# Patient Record
Sex: Male | Born: 1935 | Race: White | Hispanic: No | Marital: Married | Smoking: Former smoker
Health system: Southern US, Community
[De-identification: ages and names within clinical notes are randomized; demographics above are authoritative.]

## PROBLEM LIST (undated history)

## (undated) DIAGNOSIS — D490 Neoplasm of unspecified behavior of digestive system: Secondary | ICD-10-CM

## (undated) DIAGNOSIS — R42 Dizziness and giddiness: Secondary | ICD-10-CM

## (undated) DIAGNOSIS — E7800 Pure hypercholesterolemia, unspecified: Secondary | ICD-10-CM

## (undated) DIAGNOSIS — I10 Essential (primary) hypertension: Secondary | ICD-10-CM

## (undated) DIAGNOSIS — N3010 Interstitial cystitis (chronic) without hematuria: Secondary | ICD-10-CM

## (undated) DIAGNOSIS — E11319 Type 2 diabetes mellitus with unspecified diabetic retinopathy without macular edema: Secondary | ICD-10-CM

## (undated) DIAGNOSIS — H269 Unspecified cataract: Secondary | ICD-10-CM

## (undated) DIAGNOSIS — R972 Elevated prostate specific antigen [PSA]: Secondary | ICD-10-CM

## (undated) DIAGNOSIS — Z923 Personal history of irradiation: Secondary | ICD-10-CM

## (undated) DIAGNOSIS — E119 Type 2 diabetes mellitus without complications: Secondary | ICD-10-CM

## (undated) DIAGNOSIS — C4490 Unspecified malignant neoplasm of skin, unspecified: Secondary | ICD-10-CM

## (undated) DIAGNOSIS — D126 Benign neoplasm of colon, unspecified: Secondary | ICD-10-CM

## (undated) DIAGNOSIS — E785 Hyperlipidemia, unspecified: Secondary | ICD-10-CM

## (undated) DIAGNOSIS — C61 Malignant neoplasm of prostate: Secondary | ICD-10-CM

## (undated) DIAGNOSIS — I739 Peripheral vascular disease, unspecified: Secondary | ICD-10-CM

## (undated) DIAGNOSIS — I6529 Occlusion and stenosis of unspecified carotid artery: Secondary | ICD-10-CM

## (undated) DIAGNOSIS — I679 Cerebrovascular disease, unspecified: Secondary | ICD-10-CM

## (undated) DIAGNOSIS — H9190 Unspecified hearing loss, unspecified ear: Secondary | ICD-10-CM

## (undated) DIAGNOSIS — Z789 Other specified health status: Secondary | ICD-10-CM

## (undated) DIAGNOSIS — I2510 Atherosclerotic heart disease of native coronary artery without angina pectoris: Secondary | ICD-10-CM

## (undated) DIAGNOSIS — H3530 Unspecified macular degeneration: Secondary | ICD-10-CM

## (undated) DIAGNOSIS — K56609 Unspecified intestinal obstruction, unspecified as to partial versus complete obstruction: Secondary | ICD-10-CM

## (undated) DIAGNOSIS — K219 Gastro-esophageal reflux disease without esophagitis: Secondary | ICD-10-CM

## (undated) DIAGNOSIS — D500 Iron deficiency anemia secondary to blood loss (chronic): Secondary | ICD-10-CM

## (undated) DIAGNOSIS — I240 Acute coronary thrombosis not resulting in myocardial infarction: Secondary | ICD-10-CM

## (undated) HISTORY — DX: Iron deficiency anemia secondary to blood loss (chronic): D50.0

## (undated) HISTORY — DX: Atherosclerotic heart disease of native coronary artery without angina pectoris: I25.10

## (undated) HISTORY — PX: DENTAL SURGERY: SHX609

## (undated) HISTORY — DX: Dizziness and giddiness: R42

## (undated) HISTORY — DX: Essential (primary) hypertension: I10

## (undated) HISTORY — PX: URETEROLITHOTOMY: SHX71

## (undated) HISTORY — DX: Elevated prostate specific antigen (PSA): R97.20

## (undated) HISTORY — DX: Type 2 diabetes mellitus with unspecified diabetic retinopathy without macular edema: E11.319

## (undated) HISTORY — DX: Other specified health status: Z78.9

## (undated) HISTORY — DX: Peripheral vascular disease, unspecified: I73.9

## (undated) HISTORY — DX: Interstitial cystitis (chronic) without hematuria: N30.10

## (undated) HISTORY — DX: Type 2 diabetes mellitus without complications: E11.9

## (undated) HISTORY — DX: Occlusion and stenosis of unspecified carotid artery: I65.29

## (undated) HISTORY — DX: Hyperlipidemia, unspecified: E78.5

## (undated) HISTORY — DX: Cerebrovascular disease, unspecified: I67.9

---
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
## 1970-06-24 HISTORY — PX: KIDNEY STONE SURGERY: SHX686

## 1978-06-24 HISTORY — PX: OTHER SURGICAL HISTORY: SHX169

## 1999-06-25 DIAGNOSIS — I240 Acute coronary thrombosis not resulting in myocardial infarction: Secondary | ICD-10-CM

## 1999-06-25 HISTORY — DX: Acute coronary thrombosis not resulting in myocardial infarction: I24.0

## 1999-06-25 HISTORY — PX: CORONARY ARTERY BYPASS GRAFT: SHX141

## 1999-11-23 HISTORY — PX: CARDIAC CATHETERIZATION: SHX172

## 1999-12-12 ENCOUNTER — Encounter (INDEPENDENT_AMBULATORY_CARE_PROVIDER_SITE_OTHER): Payer: Self-pay | Admitting: *Deleted

## 1999-12-12 ENCOUNTER — Inpatient Hospital Stay (HOSPITAL_COMMUNITY): Admission: AD | Admit: 1999-12-12 | Discharge: 1999-12-21 | Payer: Self-pay | Admitting: Cardiology

## 1999-12-14 ENCOUNTER — Encounter: Payer: Self-pay | Admitting: Thoracic Surgery (Cardiothoracic Vascular Surgery)

## 1999-12-15 ENCOUNTER — Encounter: Payer: Self-pay | Admitting: Thoracic Surgery (Cardiothoracic Vascular Surgery)

## 1999-12-16 ENCOUNTER — Encounter: Payer: Self-pay | Admitting: Thoracic Surgery (Cardiothoracic Vascular Surgery)

## 1999-12-17 ENCOUNTER — Encounter: Payer: Self-pay | Admitting: Thoracic Surgery (Cardiothoracic Vascular Surgery)

## 1999-12-18 ENCOUNTER — Encounter: Payer: Self-pay | Admitting: Thoracic Surgery (Cardiothoracic Vascular Surgery)

## 2000-01-14 ENCOUNTER — Encounter: Payer: Self-pay | Admitting: Thoracic Surgery (Cardiothoracic Vascular Surgery)

## 2000-01-14 ENCOUNTER — Encounter
Admission: RE | Admit: 2000-01-14 | Discharge: 2000-01-14 | Payer: Self-pay | Admitting: Thoracic Surgery (Cardiothoracic Vascular Surgery)

## 2000-02-05 ENCOUNTER — Encounter (HOSPITAL_COMMUNITY): Admission: RE | Admit: 2000-02-05 | Discharge: 2000-05-05 | Payer: Self-pay | Admitting: Cardiology

## 2004-06-05 ENCOUNTER — Ambulatory Visit: Payer: Self-pay | Admitting: Cardiology

## 2004-06-27 ENCOUNTER — Ambulatory Visit: Payer: Self-pay | Admitting: Internal Medicine

## 2004-08-21 ENCOUNTER — Ambulatory Visit: Payer: Self-pay | Admitting: Internal Medicine

## 2004-10-04 ENCOUNTER — Ambulatory Visit: Payer: Self-pay | Admitting: Internal Medicine

## 2004-12-05 ENCOUNTER — Ambulatory Visit: Payer: Self-pay

## 2004-12-13 ENCOUNTER — Ambulatory Visit: Payer: Self-pay | Admitting: Cardiology

## 2004-12-18 ENCOUNTER — Ambulatory Visit: Payer: Self-pay

## 2005-01-24 ENCOUNTER — Ambulatory Visit: Payer: Self-pay | Admitting: Internal Medicine

## 2005-01-31 ENCOUNTER — Ambulatory Visit: Payer: Self-pay | Admitting: Internal Medicine

## 2005-05-24 ENCOUNTER — Ambulatory Visit: Payer: Self-pay | Admitting: Internal Medicine

## 2005-06-07 ENCOUNTER — Ambulatory Visit: Payer: Self-pay | Admitting: Internal Medicine

## 2005-10-08 ENCOUNTER — Ambulatory Visit: Payer: Self-pay | Admitting: Internal Medicine

## 2005-10-15 ENCOUNTER — Ambulatory Visit: Payer: Self-pay | Admitting: Internal Medicine

## 2005-12-13 ENCOUNTER — Ambulatory Visit: Payer: Self-pay

## 2005-12-18 ENCOUNTER — Ambulatory Visit: Payer: Self-pay | Admitting: Cardiology

## 2006-02-03 ENCOUNTER — Ambulatory Visit: Payer: Self-pay | Admitting: Internal Medicine

## 2006-02-10 ENCOUNTER — Ambulatory Visit: Payer: Self-pay | Admitting: Internal Medicine

## 2006-06-05 ENCOUNTER — Ambulatory Visit: Payer: Self-pay | Admitting: Internal Medicine

## 2006-06-05 LAB — CONVERTED CEMR LAB
ALT: 18 units/L (ref 0–40)
AST: 19 units/L (ref 0–37)
Albumin: 4 g/dL (ref 3.5–5.2)
Alkaline Phosphatase: 48 units/L (ref 39–117)
BUN: 18 mg/dL (ref 6–23)
Bilirubin, Direct: 0.2 mg/dL (ref 0.0–0.3)
CO2: 25 meq/L (ref 19–32)
Calcium: 9.3 mg/dL (ref 8.4–10.5)
Chloride: 102 meq/L (ref 96–112)
Chol/HDL Ratio, serum: 4.7
Cholesterol: 140 mg/dL (ref 0–200)
Creatinine, Ser: 1.3 mg/dL (ref 0.4–1.5)
Creatinine,U: 152.5 mg/dL
GFR calc non Af Amer: 58 mL/min
Glomerular Filtration Rate, Af Am: 70 mL/min/{1.73_m2}
Glucose, Bld: 179 mg/dL — ABNORMAL HIGH (ref 70–99)
HDL: 29.6 mg/dL — ABNORMAL LOW (ref >39.0)
Hgb A1c MFr Bld: 7.5 % — ABNORMAL HIGH (ref 4.6–6.0)
LDL DIRECT: 91.5 mg/dL
Microalb Creat Ratio: 5.9 mg/g (ref 0.0–30.0)
Microalb, Ur: 0.9 mg/dL (ref 0.0–1.9)
Potassium: 4.1 meq/L (ref 3.5–5.1)
Sodium: 137 meq/L (ref 135–145)
Total Bilirubin: 0.6 mg/dL (ref 0.3–1.2)
Total Protein: 6.3 g/dL (ref 6.0–8.3)
Triglyceride fasting, serum: 202 mg/dL (ref 0–149)
VLDL: 40 mg/dL (ref 0–40)

## 2006-06-12 ENCOUNTER — Ambulatory Visit: Payer: Self-pay | Admitting: Internal Medicine

## 2006-10-09 ENCOUNTER — Ambulatory Visit: Payer: Self-pay | Admitting: Internal Medicine

## 2006-10-09 LAB — CONVERTED CEMR LAB
ALT: 27 units/L (ref 0–40)
AST: 21 units/L (ref 0–37)
Albumin: 4 g/dL (ref 3.5–5.2)
Alkaline Phosphatase: 44 units/L (ref 39–117)
BUN: 17 mg/dL (ref 6–23)
Bilirubin, Direct: 0.1 mg/dL (ref 0.0–0.3)
CO2: 31 meq/L (ref 19–32)
Calcium: 9.6 mg/dL (ref 8.4–10.5)
Chloride: 100 meq/L (ref 96–112)
Cholesterol: 160 mg/dL (ref 0–200)
Creatinine, Ser: 1.2 mg/dL (ref 0.4–1.5)
Direct LDL: 93.2 mg/dL
GFR calc Af Amer: 76 mL/min
GFR calc non Af Amer: 63 mL/min
Glucose, Bld: 132 mg/dL — ABNORMAL HIGH (ref 70–99)
HDL: 37.2 mg/dL — ABNORMAL LOW (ref >39.0)
Hgb A1c MFr Bld: 7.6 % — ABNORMAL HIGH (ref 4.6–6.0)
Potassium: 3.8 meq/L (ref 3.5–5.1)
Sodium: 142 meq/L (ref 135–145)
Total Bilirubin: 0.9 mg/dL (ref 0.3–1.2)
Total CHOL/HDL Ratio: 4.3
Total Protein: 7.2 g/dL (ref 6.0–8.3)
Triglycerides: 204 mg/dL (ref 0–149)
VLDL: 41 mg/dL — ABNORMAL HIGH (ref 0–40)

## 2006-10-20 ENCOUNTER — Ambulatory Visit: Payer: Self-pay | Admitting: Internal Medicine

## 2006-12-19 ENCOUNTER — Ambulatory Visit: Payer: Self-pay | Admitting: Cardiology

## 2007-01-16 DIAGNOSIS — I10 Essential (primary) hypertension: Secondary | ICD-10-CM | POA: Insufficient documentation

## 2007-01-16 DIAGNOSIS — E1151 Type 2 diabetes mellitus with diabetic peripheral angiopathy without gangrene: Secondary | ICD-10-CM | POA: Insufficient documentation

## 2007-01-16 DIAGNOSIS — N3010 Interstitial cystitis (chronic) without hematuria: Secondary | ICD-10-CM | POA: Insufficient documentation

## 2007-01-16 DIAGNOSIS — E785 Hyperlipidemia, unspecified: Secondary | ICD-10-CM | POA: Insufficient documentation

## 2007-01-16 DIAGNOSIS — I2510 Atherosclerotic heart disease of native coronary artery without angina pectoris: Secondary | ICD-10-CM | POA: Insufficient documentation

## 2007-01-16 DIAGNOSIS — C61 Malignant neoplasm of prostate: Secondary | ICD-10-CM | POA: Insufficient documentation

## 2007-02-09 ENCOUNTER — Ambulatory Visit: Payer: Self-pay | Admitting: Internal Medicine

## 2007-02-09 LAB — CONVERTED CEMR LAB
ALT: 22 units/L (ref 0–53)
AST: 20 units/L (ref 0–37)
Albumin: 3.9 g/dL (ref 3.5–5.2)
Alkaline Phosphatase: 43 units/L (ref 39–117)
BUN: 16 mg/dL (ref 6–23)
Bilirubin, Direct: 0.1 mg/dL (ref 0.0–0.3)
CO2: 29 meq/L (ref 19–32)
Calcium: 9 mg/dL (ref 8.4–10.5)
Chloride: 106 meq/L (ref 96–112)
Cholesterol: 154 mg/dL (ref 0–200)
Creatinine, Ser: 1.3 mg/dL (ref 0.4–1.5)
Direct LDL: 84.6 mg/dL
GFR calc Af Amer: 70 mL/min
GFR calc non Af Amer: 58 mL/min
Glucose, Bld: 154 mg/dL — ABNORMAL HIGH (ref 70–99)
HDL: 30 mg/dL — ABNORMAL LOW (ref >39.0)
Hgb A1c MFr Bld: 7 % — ABNORMAL HIGH (ref 4.6–6.0)
Potassium: 4.5 meq/L (ref 3.5–5.1)
Sodium: 141 meq/L (ref 135–145)
Total Bilirubin: 0.7 mg/dL (ref 0.3–1.2)
Total CHOL/HDL Ratio: 5.1
Total Protein: 7 g/dL (ref 6.0–8.3)
Triglycerides: 243 mg/dL (ref 0–149)
VLDL: 49 mg/dL — ABNORMAL HIGH (ref 0–40)

## 2007-02-16 ENCOUNTER — Ambulatory Visit: Payer: Self-pay | Admitting: Internal Medicine

## 2007-02-24 ENCOUNTER — Ambulatory Visit: Payer: Self-pay

## 2007-02-24 ENCOUNTER — Encounter: Payer: Self-pay | Admitting: Internal Medicine

## 2007-03-10 ENCOUNTER — Telehealth: Payer: Self-pay | Admitting: Internal Medicine

## 2007-03-13 ENCOUNTER — Telehealth (INDEPENDENT_AMBULATORY_CARE_PROVIDER_SITE_OTHER): Payer: Self-pay | Admitting: *Deleted

## 2007-03-27 ENCOUNTER — Ambulatory Visit: Payer: Self-pay

## 2007-03-27 ENCOUNTER — Encounter: Payer: Self-pay | Admitting: Internal Medicine

## 2007-04-13 ENCOUNTER — Telehealth: Payer: Self-pay | Admitting: Internal Medicine

## 2007-04-16 ENCOUNTER — Ambulatory Visit: Payer: Self-pay | Admitting: Internal Medicine

## 2007-05-07 ENCOUNTER — Ambulatory Visit: Payer: Self-pay | Admitting: Cardiology

## 2007-06-25 HISTORY — PX: OTHER SURGICAL HISTORY: SHX169

## 2007-07-03 ENCOUNTER — Encounter: Payer: Self-pay | Admitting: Internal Medicine

## 2007-07-08 ENCOUNTER — Encounter: Payer: Self-pay | Admitting: Internal Medicine

## 2007-07-28 ENCOUNTER — Ambulatory Visit: Payer: Self-pay | Admitting: Internal Medicine

## 2007-07-29 LAB — CONVERTED CEMR LAB
ALT: 22 units/L (ref 0–53)
AST: 19 units/L (ref 0–37)
Albumin: 4 g/dL (ref 3.5–5.2)
Alkaline Phosphatase: 41 units/L (ref 39–117)
BUN: 16 mg/dL (ref 6–23)
Bilirubin, Direct: 0.2 mg/dL (ref 0.0–0.3)
CO2: 29 meq/L (ref 19–32)
Calcium: 9.7 mg/dL (ref 8.4–10.5)
Chloride: 100 meq/L (ref 96–112)
Cholesterol: 168 mg/dL (ref 0–200)
Creatinine, Ser: 1.3 mg/dL (ref 0.4–1.5)
GFR calc Af Amer: 69 mL/min
GFR calc non Af Amer: 57 mL/min
Glucose, Bld: 131 mg/dL — ABNORMAL HIGH (ref 70–99)
HDL: 31.3 mg/dL — ABNORMAL LOW (ref >39.0)
Hgb A1c MFr Bld: 6.7 % — ABNORMAL HIGH (ref 4.6–6.0)
LDL Cholesterol: 103 mg/dL — ABNORMAL HIGH (ref 0–99)
Potassium: 4.4 meq/L (ref 3.5–5.1)
Sodium: 138 meq/L (ref 135–145)
Total Bilirubin: 0.8 mg/dL (ref 0.3–1.2)
Total CHOL/HDL Ratio: 5.4
Total Protein: 6.8 g/dL (ref 6.0–8.3)
Triglycerides: 170 mg/dL — ABNORMAL HIGH (ref 0–149)
VLDL: 34 mg/dL (ref 0–40)

## 2007-10-21 ENCOUNTER — Ambulatory Visit: Payer: Self-pay | Admitting: Internal Medicine

## 2007-12-03 ENCOUNTER — Ambulatory Visit: Payer: Self-pay | Admitting: Cardiology

## 2007-12-03 ENCOUNTER — Ambulatory Visit: Payer: Self-pay

## 2007-12-15 ENCOUNTER — Encounter: Admission: RE | Admit: 2007-12-15 | Discharge: 2007-12-15 | Payer: Self-pay | Admitting: *Deleted

## 2007-12-16 ENCOUNTER — Ambulatory Visit (HOSPITAL_BASED_OUTPATIENT_CLINIC_OR_DEPARTMENT_OTHER): Admission: RE | Admit: 2007-12-16 | Discharge: 2007-12-16 | Payer: Self-pay | Admitting: *Deleted

## 2008-01-14 ENCOUNTER — Ambulatory Visit: Payer: Self-pay

## 2008-01-14 ENCOUNTER — Encounter: Payer: Self-pay | Admitting: Internal Medicine

## 2008-01-25 ENCOUNTER — Ambulatory Visit: Payer: Self-pay | Admitting: Internal Medicine

## 2008-01-26 LAB — CONVERTED CEMR LAB
ALT: 23 units/L (ref 0–53)
AST: 23 units/L (ref 0–37)
BUN: 16 mg/dL (ref 6–23)
CO2: 30 meq/L (ref 19–32)
Calcium: 9.3 mg/dL (ref 8.4–10.5)
Chloride: 103 meq/L (ref 96–112)
Cholesterol: 169 mg/dL (ref 0–200)
Creatinine, Ser: 1.3 mg/dL (ref 0.4–1.5)
Direct LDL: 106 mg/dL
GFR calc Af Amer: 69 mL/min
GFR calc non Af Amer: 57 mL/min
Glucose, Bld: 95 mg/dL (ref 70–99)
HDL: 31.9 mg/dL — ABNORMAL LOW (ref >39.0)
Hgb A1c MFr Bld: 7 % — ABNORMAL HIGH (ref 4.6–6.0)
Potassium: 4.5 meq/L (ref 3.5–5.1)
Sodium: 139 meq/L (ref 135–145)
Total CHOL/HDL Ratio: 5.3
Triglycerides: 215 mg/dL (ref 0–149)
VLDL: 43 mg/dL — ABNORMAL HIGH (ref 0–40)

## 2008-05-26 ENCOUNTER — Ambulatory Visit: Payer: Self-pay | Admitting: Internal Medicine

## 2008-05-30 LAB — CONVERTED CEMR LAB
ALT: 19 units/L (ref 0–53)
AST: 21 units/L (ref 0–37)
BUN: 18 mg/dL (ref 6–23)
CO2: 28 meq/L (ref 19–32)
Calcium: 10.3 mg/dL (ref 8.4–10.5)
Chloride: 103 meq/L (ref 96–112)
Cholesterol: 157 mg/dL (ref 0–200)
Creatinine, Ser: 1.2 mg/dL (ref 0.4–1.5)
GFR calc Af Amer: 76 mL/min
GFR calc non Af Amer: 63 mL/min
Glucose, Bld: 111 mg/dL — ABNORMAL HIGH (ref 70–99)
HDL: 30.1 mg/dL — ABNORMAL LOW (ref >39.0)
Hgb A1c MFr Bld: 6.7 % — ABNORMAL HIGH (ref 4.6–6.0)
LDL Cholesterol: 92 mg/dL (ref 0–99)
Potassium: 4.8 meq/L (ref 3.5–5.1)
Sodium: 138 meq/L (ref 135–145)
Total CHOL/HDL Ratio: 5.2
Triglycerides: 177 mg/dL — ABNORMAL HIGH (ref 0–149)
VLDL: 35 mg/dL (ref 0–40)

## 2008-09-28 ENCOUNTER — Ambulatory Visit: Payer: Self-pay | Admitting: Internal Medicine

## 2008-10-03 LAB — CONVERTED CEMR LAB
ALT: 22 units/L (ref 0–53)
AST: 20 units/L (ref 0–37)
Albumin: 3.7 g/dL (ref 3.5–5.2)
Alkaline Phosphatase: 40 units/L (ref 39–117)
BUN: 19 mg/dL (ref 6–23)
Bilirubin, Direct: 0 mg/dL (ref 0.0–0.3)
CO2: 28 meq/L (ref 19–32)
Calcium: 9.3 mg/dL (ref 8.4–10.5)
Chloride: 105 meq/L (ref 96–112)
Cholesterol: 167 mg/dL (ref 0–200)
Creatinine, Ser: 1.2 mg/dL (ref 0.4–1.5)
Direct LDL: 106.7 mg/dL
GFR calc non Af Amer: 62.64 mL/min (ref >60)
Glucose, Bld: 111 mg/dL — ABNORMAL HIGH (ref 70–99)
HDL: 31.6 mg/dL — ABNORMAL LOW (ref >39.00)
Hgb A1c MFr Bld: 6.7 % — ABNORMAL HIGH (ref 4.6–6.5)
Potassium: 4.3 meq/L (ref 3.5–5.1)
Sodium: 141 meq/L (ref 135–145)
Total Bilirubin: 1.1 mg/dL (ref 0.3–1.2)
Total CHOL/HDL Ratio: 5
Total Protein: 7.1 g/dL (ref 6.0–8.3)
Triglycerides: 214 mg/dL — ABNORMAL HIGH (ref 0.0–149.0)
VLDL: 42.8 mg/dL — ABNORMAL HIGH (ref 0.0–40.0)

## 2008-11-18 ENCOUNTER — Ambulatory Visit: Payer: Self-pay | Admitting: Family Medicine

## 2008-12-10 DIAGNOSIS — I679 Cerebrovascular disease, unspecified: Secondary | ICD-10-CM | POA: Insufficient documentation

## 2008-12-15 ENCOUNTER — Ambulatory Visit: Payer: Self-pay

## 2008-12-15 ENCOUNTER — Ambulatory Visit: Payer: Self-pay | Admitting: Cardiology

## 2009-02-01 ENCOUNTER — Ambulatory Visit: Payer: Self-pay | Admitting: Internal Medicine

## 2009-02-02 LAB — CONVERTED CEMR LAB
ALT: 21 units/L (ref 0–53)
AST: 21 units/L (ref 0–37)
Albumin: 4 g/dL (ref 3.5–5.2)
Alkaline Phosphatase: 52 units/L (ref 39–117)
BUN: 16 mg/dL (ref 6–23)
Bilirubin, Direct: 0 mg/dL (ref 0.0–0.3)
CO2: 28 meq/L (ref 19–32)
Calcium: 9.4 mg/dL (ref 8.4–10.5)
Chloride: 102 meq/L (ref 96–112)
Cholesterol: 155 mg/dL (ref 0–200)
Creatinine, Ser: 1.2 mg/dL (ref 0.4–1.5)
Direct LDL: 95.5 mg/dL
GFR calc non Af Amer: 62.58 mL/min (ref >60)
Glucose, Bld: 184 mg/dL — ABNORMAL HIGH (ref 70–99)
HDL: 31.9 mg/dL — ABNORMAL LOW (ref >39.00)
Hgb A1c MFr Bld: 7 % — ABNORMAL HIGH (ref 4.6–6.5)
Potassium: 4.5 meq/L (ref 3.5–5.1)
Sodium: 138 meq/L (ref 135–145)
TSH: 1.67 microintl units/mL (ref 0.35–5.50)
Total Bilirubin: 0.6 mg/dL (ref 0.3–1.2)
Total CHOL/HDL Ratio: 5
Total Protein: 7.4 g/dL (ref 6.0–8.3)
Triglycerides: 216 mg/dL — ABNORMAL HIGH (ref 0.0–149.0)
VLDL: 43.2 mg/dL — ABNORMAL HIGH (ref 0.0–40.0)

## 2009-03-27 ENCOUNTER — Telehealth: Payer: Self-pay | Admitting: Internal Medicine

## 2009-06-02 ENCOUNTER — Ambulatory Visit: Payer: Self-pay | Admitting: Internal Medicine

## 2009-06-02 DIAGNOSIS — I739 Peripheral vascular disease, unspecified: Secondary | ICD-10-CM | POA: Insufficient documentation

## 2009-06-05 LAB — CONVERTED CEMR LAB
ALT: 26 units/L (ref 0–53)
AST: 23 units/L (ref 0–37)
Albumin: 4 g/dL (ref 3.5–5.2)
Alkaline Phosphatase: 41 units/L (ref 39–117)
BUN: 19 mg/dL (ref 6–23)
Bilirubin, Direct: 0 mg/dL (ref 0.0–0.3)
CO2: 29 meq/L (ref 19–32)
Calcium: 9 mg/dL (ref 8.4–10.5)
Chloride: 100 meq/L (ref 96–112)
Cholesterol: 174 mg/dL (ref 0–200)
Creatinine, Ser: 1.1 mg/dL (ref 0.4–1.5)
GFR calc non Af Amer: 69.13 mL/min (ref >60)
Glucose, Bld: 189 mg/dL — ABNORMAL HIGH (ref 70–99)
HDL: 31.1 mg/dL — ABNORMAL LOW (ref >39.00)
Hgb A1c MFr Bld: 7.4 % — ABNORMAL HIGH (ref 4.6–6.5)
LDL Cholesterol: 105 mg/dL — ABNORMAL HIGH (ref 0–99)
Potassium: 4.7 meq/L (ref 3.5–5.1)
Sodium: 136 meq/L (ref 135–145)
Total Bilirubin: 0.8 mg/dL (ref 0.3–1.2)
Total CHOL/HDL Ratio: 6
Total Protein: 7.3 g/dL (ref 6.0–8.3)
Triglycerides: 188 mg/dL — ABNORMAL HIGH (ref 0.0–149.0)
VLDL: 37.6 mg/dL (ref 0.0–40.0)

## 2009-06-26 ENCOUNTER — Encounter: Payer: Self-pay | Admitting: Internal Medicine

## 2009-06-27 ENCOUNTER — Ambulatory Visit: Payer: Self-pay

## 2009-06-27 ENCOUNTER — Encounter: Payer: Self-pay | Admitting: Cardiovascular Disease

## 2009-12-22 LAB — HM DIABETES EYE EXAM: HM Diabetic Eye Exam: NORMAL

## 2009-12-28 ENCOUNTER — Ambulatory Visit: Payer: Self-pay | Admitting: Internal Medicine

## 2010-01-12 ENCOUNTER — Encounter: Payer: Self-pay | Admitting: Cardiology

## 2010-01-15 ENCOUNTER — Encounter: Admission: RE | Admit: 2010-01-15 | Discharge: 2010-01-18 | Payer: Self-pay | Admitting: Internal Medicine

## 2010-01-16 ENCOUNTER — Ambulatory Visit: Payer: Self-pay | Admitting: Cardiology

## 2010-01-16 ENCOUNTER — Ambulatory Visit: Payer: Self-pay

## 2010-01-22 LAB — CONVERTED CEMR LAB
ALT: 21 units/L (ref 0–53)
AST: 23 units/L (ref 0–37)
Albumin: 4 g/dL (ref 3.5–5.2)
Alkaline Phosphatase: 42 units/L (ref 39–117)
BUN: 21 mg/dL (ref 6–23)
Basophils Absolute: 0 10*3/uL (ref 0.0–0.1)
Basophils Relative: 0.4 % (ref 0.0–3.0)
Bilirubin, Direct: 0.1 mg/dL (ref 0.0–0.3)
CO2: 25 meq/L (ref 19–32)
Calcium: 9.6 mg/dL (ref 8.4–10.5)
Chloride: 103 meq/L (ref 96–112)
Cholesterol: 193 mg/dL (ref 0–200)
Creatinine, Ser: 1.4 mg/dL (ref 0.4–1.5)
Direct LDL: 120.3 mg/dL
Eosinophils Absolute: 0.4 10*3/uL (ref 0.0–0.7)
Eosinophils Relative: 5.2 % — ABNORMAL HIGH (ref 0.0–5.0)
GFR calc non Af Amer: 54.03 mL/min (ref >60)
Glucose, Bld: 163 mg/dL — ABNORMAL HIGH (ref 70–99)
HCT: 38.5 % — ABNORMAL LOW (ref 39.0–52.0)
HDL: 34.2 mg/dL — ABNORMAL LOW (ref >39.00)
Hemoglobin: 12.9 g/dL — ABNORMAL LOW (ref 13.0–17.0)
Hgb A1c MFr Bld: 7.6 % — ABNORMAL HIGH (ref 4.6–6.5)
Lymphocytes Relative: 24.4 % (ref 12.0–46.0)
Lymphs Abs: 1.7 10*3/uL (ref 0.7–4.0)
MCHC: 33.7 g/dL (ref 30.0–36.0)
MCV: 89.8 fL (ref 78.0–100.0)
Monocytes Absolute: 0.4 10*3/uL (ref 0.1–1.0)
Monocytes Relative: 6.1 % (ref 3.0–12.0)
Neutro Abs: 4.4 10*3/uL (ref 1.4–7.7)
Neutrophils Relative %: 63.9 % (ref 43.0–77.0)
Platelets: 212 10*3/uL (ref 150.0–400.0)
Potassium: 4.7 meq/L (ref 3.5–5.1)
RBC: 4.28 M/uL (ref 4.22–5.81)
RDW: 13.8 % (ref 11.5–14.6)
Sodium: 139 meq/L (ref 135–145)
TSH: 2.19 microintl units/mL (ref 0.35–5.50)
Total Bilirubin: 0.5 mg/dL (ref 0.3–1.2)
Total CHOL/HDL Ratio: 6
Total Protein: 7.1 g/dL (ref 6.0–8.3)
Triglycerides: 328 mg/dL — ABNORMAL HIGH (ref 0.0–149.0)
VLDL: 65.6 mg/dL — ABNORMAL HIGH (ref 0.0–40.0)
WBC: 6.9 10*3/uL (ref 4.5–10.5)

## 2010-01-23 ENCOUNTER — Encounter: Payer: Self-pay | Admitting: Internal Medicine

## 2010-02-15 ENCOUNTER — Ambulatory Visit: Payer: Self-pay | Admitting: Internal Medicine

## 2010-02-15 DIAGNOSIS — K219 Gastro-esophageal reflux disease without esophagitis: Secondary | ICD-10-CM | POA: Insufficient documentation

## 2010-02-15 DIAGNOSIS — D649 Anemia, unspecified: Secondary | ICD-10-CM | POA: Insufficient documentation

## 2010-02-15 LAB — CONVERTED CEMR LAB
Ferritin: 15.3 ng/mL — ABNORMAL LOW (ref 22.0–322.0)
Folate: >20 ng/mL
Iron: 68 ug/dL (ref 42–165)
Saturation Ratios: 14.8 % — ABNORMAL LOW (ref 20.0–50.0)
Transferrin: 328.4 mg/dL (ref 212.0–360.0)
Vitamin B-12: 312 pg/mL (ref 211–911)

## 2010-03-24 DIAGNOSIS — D490 Neoplasm of unspecified behavior of digestive system: Secondary | ICD-10-CM

## 2010-03-24 HISTORY — DX: Neoplasm of unspecified behavior of digestive system: D49.0

## 2010-03-26 ENCOUNTER — Ambulatory Visit: Payer: Self-pay | Admitting: Internal Medicine

## 2010-03-26 LAB — CONVERTED CEMR LAB: UREASE: NEGATIVE

## 2010-03-26 LAB — HM COLONOSCOPY

## 2010-03-28 ENCOUNTER — Encounter: Payer: Self-pay | Admitting: Internal Medicine

## 2010-04-09 ENCOUNTER — Encounter: Payer: Self-pay | Admitting: Cardiology

## 2010-04-09 ENCOUNTER — Encounter: Payer: Self-pay | Admitting: Internal Medicine

## 2010-04-11 ENCOUNTER — Encounter: Admission: RE | Admit: 2010-04-11 | Discharge: 2010-04-11 | Payer: Self-pay | Admitting: General Surgery

## 2010-04-13 ENCOUNTER — Telehealth: Payer: Self-pay | Admitting: Cardiology

## 2010-04-24 ENCOUNTER — Telehealth (INDEPENDENT_AMBULATORY_CARE_PROVIDER_SITE_OTHER): Payer: Self-pay | Admitting: *Deleted

## 2010-04-25 ENCOUNTER — Encounter (HOSPITAL_COMMUNITY)
Admission: RE | Admit: 2010-04-25 | Discharge: 2010-06-23 | Payer: Self-pay | Source: Home / Self Care | Attending: Cardiology | Admitting: Cardiology

## 2010-04-25 ENCOUNTER — Ambulatory Visit: Payer: Self-pay

## 2010-04-25 ENCOUNTER — Ambulatory Visit: Payer: Self-pay | Admitting: Cardiology

## 2010-04-25 ENCOUNTER — Encounter: Payer: Self-pay | Admitting: Cardiology

## 2010-04-27 ENCOUNTER — Ambulatory Visit: Payer: Self-pay | Admitting: Internal Medicine

## 2010-04-27 LAB — CONVERTED CEMR LAB
ALT: 23 units/L (ref 0–53)
AST: 24 units/L (ref 0–37)
Albumin: 4.1 g/dL (ref 3.5–5.2)
Alkaline Phosphatase: 44 units/L (ref 39–117)
BUN: 20 mg/dL (ref 6–23)
Bilirubin, Direct: 0.1 mg/dL (ref 0.0–0.3)
CO2: 26 meq/L (ref 19–32)
Calcium: 8.9 mg/dL (ref 8.4–10.5)
Chloride: 100 meq/L (ref 96–112)
Cholesterol: 185 mg/dL (ref 0–200)
Creatinine, Ser: 1.5 mg/dL (ref 0.4–1.5)
Direct LDL: 122 mg/dL
GFR calc non Af Amer: 50.14 mL/min (ref >60)
Glucose, Bld: 249 mg/dL — ABNORMAL HIGH (ref 70–99)
HDL: 34 mg/dL — ABNORMAL LOW (ref >39.00)
Hgb A1c MFr Bld: 7.7 % — ABNORMAL HIGH (ref 4.6–6.5)
Potassium: 4.5 meq/L (ref 3.5–5.1)
Sodium: 135 meq/L (ref 135–145)
Total Bilirubin: 0.4 mg/dL (ref 0.3–1.2)
Total CHOL/HDL Ratio: 5
Total Protein: 6.9 g/dL (ref 6.0–8.3)
Triglycerides: 293 mg/dL — ABNORMAL HIGH (ref 0.0–149.0)
VLDL: 58.6 mg/dL — ABNORMAL HIGH (ref 0.0–40.0)

## 2010-05-02 ENCOUNTER — Ambulatory Visit: Payer: Self-pay | Admitting: Cardiology

## 2010-05-04 ENCOUNTER — Ambulatory Visit: Payer: Self-pay | Admitting: Internal Medicine

## 2010-05-24 ENCOUNTER — Inpatient Hospital Stay (HOSPITAL_COMMUNITY)
Admission: RE | Admit: 2010-05-24 | Discharge: 2010-05-29 | Payer: Self-pay | Source: Home / Self Care | Admitting: General Surgery

## 2010-05-24 ENCOUNTER — Encounter (INDEPENDENT_AMBULATORY_CARE_PROVIDER_SITE_OTHER): Payer: Self-pay | Admitting: General Surgery

## 2010-05-24 ENCOUNTER — Encounter (INDEPENDENT_AMBULATORY_CARE_PROVIDER_SITE_OTHER): Payer: Self-pay | Admitting: *Deleted

## 2010-05-24 HISTORY — PX: APPENDECTOMY: SHX54

## 2010-05-24 HISTORY — PX: PARTIAL COLECTOMY: SHX5273

## 2010-06-05 ENCOUNTER — Encounter: Payer: Self-pay | Admitting: Internal Medicine

## 2010-06-11 ENCOUNTER — Encounter: Payer: Self-pay | Admitting: Internal Medicine

## 2010-06-15 ENCOUNTER — Encounter: Payer: Self-pay | Admitting: Internal Medicine

## 2010-06-24 DIAGNOSIS — C4490 Unspecified malignant neoplasm of skin, unspecified: Secondary | ICD-10-CM

## 2010-06-24 HISTORY — PX: CAROTID ENDARTERECTOMY: SUR193

## 2010-06-24 HISTORY — DX: Unspecified malignant neoplasm of skin, unspecified: C44.90

## 2010-07-26 NOTE — Assessment & Plan Note (Signed)
Summary: 6 month rov/njr   Vital Signs:  Patient Profile:   77 Years Old Male Weight:      215 pounds Temp:     98.3 degrees F Pulse rate:   68 / minute BP sitting:   138 / 72  (left arm)  Vitals Entered By: Gladis Riffle, RN (January 25, 2008 9:09 AM)                 Chief Complaint:  6 month rov, fasting--states CBG has been to 250 recently, 50-250 range at home, and 145 this AM.  History of Present Illness:  Follow-Up Visit      This is a 77 year old man who presents for Follow-up visit.  The patient denies chest pain, palpitations, dizziness, syncope, low blood sugar symptoms, high blood sugar symptoms, edema, SOB, DOE, PND, and orthopnea.  Since the last visit the patient notes no new problems or concerns.  The patient reports taking meds as prescribed, not monitoring BP, monitoring blood sugars, and dietary noncompliance.  When questioned about possible medication side effects, the patient notes none.    Past Medical History: Coronary artery disease Diabetes mellitus, type II Hyperlipidemia Hypertension elevated PSA interstitial cystitis intolerant of statins Past Surgical History: Coronary artery bypass graft  2001 kidney stones  1972 Carpal tunnel release  06/09  Social History: Married Former Smoker Regular exercise-no  Family History: father deceased- heart related at age 47 mother deceased 4 yo -no particular cause  no other complaints in a complete ROS     Prior Medication List:  AMLODIPINE BESYLATE 10 MG TABS (AMLODIPINE BESYLATE) one by mouth daily HYDROCHLOROTHIAZIDE 12.5 MG CAPS (HYDROCHLOROTHIAZIDE) Take 1 capsule by mouth once a day LISINOPRIL 20 MG TABS (LISINOPRIL) Take 1 tablet by mouth at bedtime METOPROLOL TARTRATE 50 MG TABS (METOPROLOL TARTRATE) bid MICRONASE 5 MG TABS (GLYBURIDE) 2 i AM and one in PM JANUMET 50-1000 MG  TABS (SITAGLIPTIN-METFORMIN HCL) Take 1 tablet by mouth two times a day ASPIRIN EC 325 MG  TBEC (ASPIRIN) daily    Current Allergies (reviewed today): ! LIPITOR (ATORVASTATIN CALCIUM) ! PRAVACHOL (PRAVASTATIN SODIUM) ! CRESTOR (ROSUVASTATIN CALCIUM) ! ZETIA (EZETIMIBE)  Past Surgical History:    Coronary artery bypass graft  2001    kidney stones  1972    Carpal tunnel release  06/09      Physical Exam  General:     Well-developed,well-nourished,in no acute distress; alert,appropriate and cooperative throughout examination Head:     normocephalic and atraumatic.   Eyes:     pupils equal and pupils round.   Abdomen:     Bowel sounds positive,abdomen soft and non-tender without masses, organomegaly or hernias noted.  overweight Msk:     No deformity or scoliosis noted of thoracic or lumbar spine.   Pulses:     R radial normal and L radial normal.   Extremities:     No clubbing, cyanosis, edema, or deformity noted  Neurologic:     cranial nerves II-XII intact and gait normal.   Skin:     turgor normal and color normal.   Cervical Nodes:     no anterior cervical adenopathy and no posterior cervical adenopathy.   Psych:     memory intact for recent and remote and good eye contact.    Diabetes Management Exam:    Eye Exam:       Eye Exam done elsewhere    Impression & Recommendations:  Problem # 1:  DIABETES MELLITUS,  TYPE II (ICD-250.00) he will schedule eye exam he does check feet daily needs to exercise daily, weight loss most important His updated medication list for this problem includes:    Lisinopril 20 Mg Tabs (Lisinopril) .Marland Kitchen... Take 1 tablet by mouth at bedtime    Micronase 5 Mg Tabs (Glyburide) .Marland Kitchen... 2 i am and one in pm    Janumet 50-1000 Mg Tabs (Sitagliptin-metformin hcl) .Marland Kitchen... Take 1 tablet by mouth two times a day    Aspirin Ec 325 Mg Tbec (Aspirin) .Marland Kitchen... Daily  Orders: TLB-A1C / Hgb A1C (Glycohemoglobin) (83036-A1C) TLB-BMP (Basic Metabolic Panel-BMET) (80048-METABOL) TLB-Lipid Panel (80061-LIPID) TLB-ALT (SGPT) (84460-ALT) TLB-AST (SGOT) (84450-SGOT)    Problem # 2:  HYPERTENSION (ICD-401.9) continue current meds His updated medication list for this problem includes:    Amlodipine Besylate 10 Mg Tabs (Amlodipine besylate) ..... One by mouth daily    Hydrochlorothiazide 12.5 Mg Caps (Hydrochlorothiazide) .Marland Kitchen... Take 1 capsule by mouth once a day    Lisinopril 20 Mg Tabs (Lisinopril) .Marland Kitchen... Take 1 tablet by mouth at bedtime    Metoprolol Tartrate 50 Mg Tabs (Metoprolol tartrate) ..... Bid  BP today: 138/72 Prior BP: 154/78 (10/21/2007)  Labs Reviewed: Creat: 1.3 (07/28/2007) Chol: 168 (07/28/2007)   HDL: 31.3 (07/28/2007)   LDL: 103 (07/28/2007)   TG: 170 (07/28/2007)   Problem # 3:  HYPERLIPIDEMIA (ICD-272.4) fair control check today   Labs Reviewed: Chol: 168 (07/28/2007)   HDL: 31.3 (07/28/2007)   LDL: 103 (07/28/2007)   TG: 170 (07/28/2007) SGOT: 19 (07/28/2007)   SGPT: 22 (07/28/2007)   Complete Medication List: 1)  Amlodipine Besylate 10 Mg Tabs (Amlodipine besylate) .... One by mouth daily 2)  Hydrochlorothiazide 12.5 Mg Caps (Hydrochlorothiazide) .... Take 1 capsule by mouth once a day 3)  Lisinopril 20 Mg Tabs (Lisinopril) .... Take 1 tablet by mouth at bedtime 4)  Metoprolol Tartrate 50 Mg Tabs (Metoprolol tartrate) .... Bid 5)  Micronase 5 Mg Tabs (Glyburide) .... 2 i am and one in pm 6)  Janumet 50-1000 Mg Tabs (Sitagliptin-metformin hcl) .... Take 1 tablet by mouth two times a day 7)  Aspirin Ec 325 Mg Tbec (Aspirin) .... Daily  Other Orders: GI (GI)   Patient Instructions: 1)  See your eye doctor yearly to check for diabetic eye damage.--Call dr Charlann Noss 2)  Please schedule a follow-up appointment in 4 months.   ]  Preventive Care Screening

## 2010-07-26 NOTE — Miscellaneous (Signed)
Summary: Diabetic Supplies/[facility]  Diabetic Supplies/[facility]   Imported By: Esmeralda Links D'jimraou 10/16/2007 15:30:11  _____________________________________________________________________  External Attachment:    Type:   Image     Comment:   External Document

## 2010-07-26 NOTE — Letter (Signed)
Summary: Diabetic Instructions  [facility]  [address] [place], [place] [address]   Phone: [phone]  Fax: [phone]    Clayton Harrison 1930/11/16 MRN: [id number]   x   ORAL DIABETIC MEDICATION INSTRUCTIONS  The day before your procedure:   Take your diabetic pill as you do normally  The day of your procedure:   Do not take your diabetic pill    We will check your blood sugar levels during the admission process and again in Recovery before discharging you home  ________________________________________________________________________  _  _   INSULIN (LONG ACTING) MEDICATION INSTRUCTIONS (Lantus, NPH, 70/30, Humulin, Novolin-N)   The day before your procedure:   Take  your regular evening dose    The day of your procedure:   Do not take your morning dose    _  _   INSULIN (SHORT ACTING) MEDICATION INSTRUCTIONS (Regular, Humulog, Novolog)   The day before your procedure:   Do not take your evening dose   The day of your procedure:   Do not take your morning dose   _  _   INSULIN PUMP MEDICATION INSTRUCTIONS  We will contact the physician managing your diabetic care for written dosage instructions for the day before your procedure and the day of your procedure.  Once we have received the instructions, we will contact you.

## 2010-07-26 NOTE — Progress Notes (Signed)
Summary: status of clearance   Phone Note From Other Clinic   Caller: alicia office [phone] Request: Talk with Nurse Details of Complaint: status of clearance. Initial call taken by: Lorne Skeens,  April 13, 2010 9:02 AM  Follow-up for Phone Call        Awaiting Dr. Jens Som clearance.  Office aware Dr. Jens Som is out of office and will be back Monday. Mylo Red RN     Appended Document: status of clearance clearance for what?  Appended Document: status of clearance spoke with lisa at [facility]-dr wakefield is planning a right hemoocoloctomy-pt has a mass in the right colon. will foward for dr Jens Som review   Appended Document: status of clearance schedule myoview and then f/u ov  Appended Document: Orders Update spoke with pt, myoview and ov scheduled   Clinical Lists Changes  Orders: Added new Referral order of Nuclear Stress Test (Nuc Stress Test) - Signed

## 2010-07-26 NOTE — Miscellaneous (Signed)
Summary: Orders Update-[facility] Referral  Clinical Lists Changes  Problems: Added new problem of NONSPECIFIC ABN FINDING RAD & OTH EXAM GI TRACT (ICD-793.4) - Signed Orders: Added new Test order of [facility] ([facility]) - Signed  Appended Document: Orders Update-[facility] Referral Pt. notified of appt.

## 2010-07-26 NOTE — Progress Notes (Signed)
Summary: needs advice  Phone Note Call from Patient Call back at Home Phone [phone]   Caller: Spouse-faye Call For: dr Jiah Bari Summary of Call: pt had cardio treadmill test 2 wk ago pt needs to know next step Initial call taken by: Heron Sabins,  April 13, 2007 2:26 PM  Follow-up for Phone Call        Spoke to pt. regarding stress test......Marland KitchenScheduled appt with Dr. Cato Mulligan 04/16/2007 to discuss results. Follow-up by: Lynann Beaver CMA,  April 13, 2007 4:35 PM

## 2010-07-26 NOTE — Letter (Signed)
Summary: [facility]   Imported By: Sherian Rein 07/05/2010 12:27:03  _____________________________________________________________________  External Attachment:    Type:   Image     Comment:   External Document

## 2010-07-26 NOTE — Letter (Signed)
Summary: [facility]   Imported By: Maryln Gottron 04/19/2010 10:03:16  _____________________________________________________________________  External Attachment:    Type:   Image     Comment:   External Document

## 2010-07-26 NOTE — Assessment & Plan Note (Signed)
Summary: 6 MONTH ROV/NJR   Vital Signs:  Patient profile:   77 year old male Weight:      214 pounds BMI:     32.18 Temp:     98.2 degrees F oral Pulse rate:   62 / minute Pulse rhythm:   regular Resp:     12 per minute BP sitting:   112 / 60  (left arm) Cuff size:   regular  Vitals Entered By: Gladis Riffle, RN (December 28, 2009 9:34 AM) CC: 6 month rov, fasting--CBGs around 160 daytime at home Is Patient Diabetic? Yes Did you bring your meter with you today? No   CC:  6 month rov and fasting--CBGs around 160 daytime at home.  History of Present Illness:  Follow-Up Visit      This is a 77 year old man who presents for Follow-up visit.  The patient denies chest pain and palpitations.  Since the last visit the patient notes no new problems or concerns.  The patient reports taking meds as prescribed and monitoring blood sugars.  When questioned about possible medication side effects, the patient notes none.    All other systems reviewed and were negative   Preventive Screening-Counseling & Management  Alcohol-Tobacco     Smoking Status: quit > 6 months     Year Started: 1949     Year Quit: 2001  Current Problems (verified): 1)  Peripheral Vascular Disease  (ICD-443.9) 2)  Hypertension  (ICD-401.9) 3)  Hyperlipidemia  (ICD-272.4) 4)  Coronary Artery Disease  (ICD-414.00) 5)  Cerebrovascular Disease  (ICD-437.9) 6)  Interstitial Cystitis  (ICD-595.1) 7)  Psa, Increased  (ICD-790.93) 8)  Diabetes Mellitus, Type II  (ICD-250.00)  Current Medications (verified): 1)  Amlodipine Besylate 10 Mg Tabs (Amlodipine Besylate) .... One By Mouth Daily 2)  Hydrochlorothiazide 12.5 Mg Caps (Hydrochlorothiazide) .... Take 1 Capsule By Mouth Once A Day 3)  Lisinopril 20 Mg Tabs (Lisinopril) .... Take 1 Tablet By Mouth At Bedtime 4)  Metoprolol Tartrate 50 Mg Tabs (Metoprolol Tartrate) .Marland Kitchen.. 1 Tab Po Two Times A Day 5)  Glyburide 5 Mg Tabs (Glyburide) .... Take 2 Tablets in The Morning and 1  Tablet in The Evening Daily 6)  Janumet 50-1000 Mg  Tabs (Sitagliptin-Metformin Hcl) .... Take 1 Tablet By Mouth Two Times A Day 7)  Aspirin Ec 325 Mg  Tbec (Aspirin) .Marland Kitchen.. 1 Tab Po Daily 8)  Omega-3 Fish Oil 1000 Mg Caps (Omega-3 Fatty Acids) .... Two Times A Day 9)  Vitamin C 500 Mg Tabs (Ascorbic Acid) .Marland Kitchen.. 1 Tab By Mouth Once Daily 10)  Meclizine Hcl 25 Mg Tabs (Meclizine Hcl) .Marland Kitchen.. 1 By Mouth Two Times A Day As Needed Vertigo  Allergies: 1)  ! Lipitor (Atorvastatin Calcium) 2)  ! Pravachol (Pravastatin Sodium) 3)  ! Crestor (Rosuvastatin Calcium) 4)  ! Zetia (Ezetimibe)  Past History:  Past Medical History: Last updated: 06/02/2009 Current Problems:  HYPERTENSION (ICD-401.9) HYPERLIPIDEMIA (ICD-272.4) CORONARY ARTERY DISEASE (ICD-414.00) CEREBROVASCULAR DISEASE (ICD-437.9) CLAUDICATION (ICD-443.9) DIABETES MELLITUS, TYPE II (ICD-250.00) elevated PSA interstitial cystitis intolerant of statins  Past Surgical History: Last updated: 01/25/2008 Coronary artery bypass graft  2001 kidney stones  1972 Carpal tunnel release  06/09  Family History: Last updated: 03-05-07 father deceased- heart related at age 54 mother deceased 19 yo -no particular cause  Social History: Last updated: 2007/03/05 Married Former Smoker Regular exercise-no  Risk Factors: Exercise: no (2007/03/05)  Risk Factors: Smoking Status: quit > 6 months (12/28/2009)  Physical Exam  General:  Well-developed,well-nourished,in no acute distress; alert,appropriate and cooperative throughout examination Head:  normocephalic and atraumatic.   Eyes:  pupils equal and pupils round.   Ears:  R ear normal and L ear normal.   Nose:  no external deformity and no external erythema.   Neck:  No deformities, masses, or tenderness noted. Chest Wall:  No deformities, masses, tenderness or gynecomastia noted. Lungs:  normal respiratory effort and no intercostal retractions.   Heart:  regular rhythm and no  murmur.   Abdomen:  Bowel sounds positive,abdomen soft and non-tender without masses, organomegaly or hernias noted. Msk:  No deformity or scoliosis noted of thoracic or lumbar spine.   Neurologic:  cranial nerves II-XII intact and gait normal.    Diabetes Management Exam:    Eye Exam:       Eye Exam done elsewhere          Date: 12/22/2009          Results: normalpt's report          Done by: ophthal   Impression & Recommendations:  Problem # 1:  HYPERTENSION (ICD-401.9)  controlled continue current medications  His updated medication list for this problem includes:    Amlodipine Besylate 10 Mg Tabs (Amlodipine besylate) ..... One by mouth daily    Hydrochlorothiazide 12.5 Mg Caps (Hydrochlorothiazide) .Marland Kitchen... Take 1 capsule by mouth once a day    Lisinopril 20 Mg Tabs (Lisinopril) .Marland Kitchen... Take 1 tablet by mouth at bedtime    Metoprolol Tartrate 50 Mg Tabs (Metoprolol tartrate) .Marland Kitchen... 1 tab po two times a day  BP today: 112/60 Prior BP: 118/56 (06/02/2009)  Labs Reviewed: K+: 4.7 (06/02/2009) Creat: : 1.1 (06/02/2009)   Chol: 174 (06/02/2009)   HDL: 31.10 (06/02/2009)   LDL: 105 (06/02/2009)   TG: 188.0 (06/02/2009)  Orders: Venipuncture (86578) TLB-BMP (Basic Metabolic Panel-BMET) (80048-METABOL)  Problem # 2:  HYPERLIPIDEMIA (ICD-272.4)  no meds at this time ldl close to 100 check labs Labs Reviewed: SGOT: 23 (06/02/2009)   SGPT: 26 (06/02/2009)   HDL:31.10 (06/02/2009), 31.90 (02/01/2009)  LDL:105 (06/02/2009), 92 (46/96/2952)  Chol:174 (06/02/2009), 155 (02/01/2009)  Trig:188.0 (06/02/2009), 216.0 (02/01/2009)  Orders: TLB-Hepatic/Liver Function Pnl (80076-HEPATIC) TLB-Lipid Panel (80061-LIPID) TLB-TSH (Thyroid Stimulating Hormone) (84443-TSH)  Problem # 3:  CORONARY ARTERY DISEASE (ICD-414.00)  no sxs continue current medications  His updated medication list for this problem includes:    Amlodipine Besylate 10 Mg Tabs (Amlodipine besylate) ..... One by mouth  daily    Hydrochlorothiazide 12.5 Mg Caps (Hydrochlorothiazide) .Marland Kitchen... Take 1 capsule by mouth once a day    Lisinopril 20 Mg Tabs (Lisinopril) .Marland Kitchen... Take 1 tablet by mouth at bedtime    Metoprolol Tartrate 50 Mg Tabs (Metoprolol tartrate) .Marland Kitchen... 1 tab po two times a day    Aspirin Ec 325 Mg Tbec (Aspirin) .Marland Kitchen... 1 tab po daily  Labs Reviewed: Chol: 174 (06/02/2009)   HDL: 31.10 (06/02/2009)   LDL: 105 (06/02/2009)   TG: 188.0 (06/02/2009)  Orders: TLB-CBC Platelet - w/Differential (85025-CBCD)  Problem # 4:  CEREBROVASCULAR DISEASE (ICD-437.9) no recurrence  Problem # 5:  VERTIGO (ICD-780.4) chronic refer to vestibular rehab His updated medication list for this problem includes:    Meclizine Hcl 25 Mg Tabs (Meclizine hcl) .Marland Kitchen... 1 by mouth two times a day as needed vertigo  Complete Medication List: 1)  Amlodipine Besylate 10 Mg Tabs (Amlodipine besylate) .... One by mouth daily 2)  Hydrochlorothiazide 12.5 Mg Caps (Hydrochlorothiazide) .... Take 1 capsule by mouth once  a day 3)  Lisinopril 20 Mg Tabs (Lisinopril) .... Take 1 tablet by mouth at bedtime 4)  Metoprolol Tartrate 50 Mg Tabs (Metoprolol tartrate) .Marland Kitchen.. 1 tab po two times a day 5)  Glyburide 5 Mg Tabs (Glyburide) .... Take 2 tablets in the morning and 1 tablet in the evening daily 6)  Janumet 50-1000 Mg Tabs (Sitagliptin-metformin hcl) .... Take 1 tablet by mouth two times a day 7)  Aspirin Ec 325 Mg Tbec (Aspirin) .Marland Kitchen.. 1 tab po daily 8)  Omega-3 Fish Oil 1000 Mg Caps (Omega-3 fatty acids) .... Two times a day 9)  Vitamin C 500 Mg Tabs (Ascorbic acid) .Marland Kitchen.. 1 tab by mouth once daily 10)  Meclizine Hcl 25 Mg Tabs (Meclizine hcl) .Marland Kitchen.. 1 by mouth two times a day as needed vertigo  Other Orders: TLB-A1C / Hgb A1C (Glycohemoglobin) (83036-A1C) Physical Therapy Referral (PT)  Patient Instructions: 1)  Please schedule a follow-up appointment in 4 months. 2)  labs one week prior to visit 3)  lipids---272.4 4)  lfts-995.2 5)   bmet-995.2 6)  A1C-250.02 7)

## 2010-07-26 NOTE — Miscellaneous (Signed)
Summary: Orders Update  Clinical Lists Changes  Orders: Added new Service order of EKG w/ Interpretation (93000) - Signed 

## 2010-07-26 NOTE — Procedures (Signed)
Summary: Colonoscopy  Patient: [name] Note: All result statuses are Final unless otherwise noted.  Tests: (1) Colonoscopy (COL)   COL Colonoscopy           DONE     [facility]     [address]     [place], [place]  [address]           COLONOSCOPY PROCEDURE REPORT           PATIENT:  Clayton Harrison, Clayton Harrison  MR#:  [id number]     BIRTHDATE:  10-Jun-1931, 79 yrs. old  GENDER:  male     ENDOSCOPIST:  Wilhemina Bonito. Eda Keys, MD     REF. BY:  Birdie Sons, M.D.     PROCEDURE DATE:  03/26/2010     PROCEDURE:  Colonoscopy with snare polypectomy x 1,     Colonoscopy with biopsies,     Colonoscopy with submucosal     injection ([place] Ink tattoo for marking)     ASA CLASS:  Class II     INDICATIONS:  screening, Iron deficiency anemia     MEDICATIONS:   Fentanyl 125 mcg IV, Versed 12 mg IV           DESCRIPTION OF PROCEDURE:   After the risks benefits and     alternatives of the procedure were thoroughly explained, informed     consent was obtained.  Digital rectal exam was performed and     revealed no abnormalities.   The [id number] endoscope was     introduced through the anus and advanced to the cecum, which was     identified by both the appendix and ileocecal valve, without     limitations.Time to cecum = 5:47 min.  The quality of the prep was     excellent, using MoviPrep.  The instrument was then slowly     withdrawn (time = 18:46 min) as the colon was fully examined.     <<PROCEDUREIMAGES>>           FINDINGS:  A 2.5cm sessile mass with umbilication and mild     ulceration was found in the mid ascending colon. The lesion was     concerning for primary carcinoma and not amenable to endoscopic     removeal. Multiple bx taken. [place] ink tattos were placed     submucosally 1-3cm distal (toward the anaus) to the lesion about     180 degrees from each other.  A diminutive polyp was found in the     proximal transverse colon. Polyp was snared without cautery.     Retrieval was  successful.   Moderate diverticulosis was found     throughout the colon.  The terminal ileum appeared normal.     Retroflexed views in the rectum revealed internal hemorrhoids.     The scope was then withdrawn from the patient and the procedure     completed.           COMPLICATIONS:  None     ENDOSCOPIC IMPRESSION:     1) Mass in the ascending colon - biopsied but not removed     2) Diminutive polyp in the proximal transverse colon -removed     3) Moderate diverticulosis throughout the colon     4) Normal terminal ileum     5) Internal hemorrhoids     RECOMMENDATIONS:     1) Await biopsy results     2) Surgery consultation " lap assisted right hemicolectomy"  3) Follow up colonoscopy in 1 year     ______________________________     Wilhemina Bonito. Eda Keys, MD           CC:  Lindley Magnus, MD; The Patient           n.     eSIGNED:   Wilhemina Bonito. Eda Keys at 03/26/2010 03:02 PM           [name], [id number]  Note: An exclamation mark (!) indicates a result that was not dispersed into the flowsheet. Document Creation Date: 03/26/2010 3:02 PM _______________________________________________________________________  (1) Order result status: Final Collection or observation date-time: 03/26/2010 14:47 Requested date-time:  Receipt date-time:  Reported date-time:  Referring Physician:   Ordering Physician: Fransico Setters [phone]) Specimen Source:  Source: Launa Grill Order Number: [id number] Lab site:   Appended Document: Colonoscopy Appt. made for 04/09/10 2:30 pm at [facility] Dr. Dwain Sarna.   Pt. notified.  Appended Document: Colonoscopy     Procedures Next Due Date:    Colonoscopy: 03/2011

## 2010-07-26 NOTE — Assessment & Plan Note (Signed)
Summary: Anemia    History of Present Illness Visit Type: Initial Consult Primary GI MD: Yancey Flemings MD Primary Provider: Birdie Sons, MD Requesting Provider: Birdie Sons, MD Chief Complaint: Patient here for evaluation of anemia. He denies any current GI symptoms. He does complain of some increased fatigue but no shortness of breath, no change in bowels.... History of Present Illness:   77 year old white male with hypertension, hyperlipidemia, coronary artery disease status post coronary artery bypass grafting, cerebrovascular disease, diabetes mellitus, kidney stones, and peripheral vascular disease. He sent today regarding anemia and the need for endoscopic evaluation. The patient's GI review of systems is remarkable for reflux as manifested by pyrosis and regurgitation. He has had this for about one year. He self medicates with Prevacid 15 mg daily with good results. No dysphagia. No weight loss. No lower GI complaints. No bleeding. He has not had prior screening colonoscopy. Review of outside records from December 28, 2009 reveal mild anemia with hemoglobin of 12.9. Normal MCV at 89.8.. He reports several uncles with colon cancer.   GI Review of Systems    Reports acid reflux.      Denies abdominal pain, belching, bloating, chest pain, dysphagia with liquids, dysphagia with solids, heartburn, loss of appetite, nausea, vomiting, vomiting blood, weight loss, and  weight gain.        Denies anal fissure, black tarry stools, change in bowel habit, constipation, diarrhea, diverticulosis, fecal incontinence, heme positive stool, hemorrhoids, irritable bowel syndrome, jaundice, light color stool, liver problems, rectal bleeding, and  rectal pain. Preventive Screening-Counseling & Management      Drug Use:  no.      Current Medications (verified): 1)  Amlodipine Besylate 10 Mg Tabs (Amlodipine Besylate) .... One By Mouth Daily 2)  Hydrochlorothiazide 12.5 Mg Caps (Hydrochlorothiazide) .... Take  1 Capsule By Mouth Once A Day 3)  Lisinopril 20 Mg Tabs (Lisinopril) .... Take 1 Tablet By Mouth At Bedtime 4)  Metoprolol Tartrate 50 Mg Tabs (Metoprolol Tartrate) .Marland Kitchen.. 1 Tab Po Two Times A Day 5)  Glyburide 5 Mg Tabs (Glyburide) .... Take 2 Tablets in The Morning and 1 Tablet in The Evening Daily 6)  Janumet 50-1000 Mg  Tabs (Sitagliptin-Metformin Hcl) .... Take 1 Tablet By Mouth Two Times A Day 7)  Aspirin Ec 325 Mg  Tbec (Aspirin) .Marland Kitchen.. 1 Tab Po Daily 8)  Omega-3 Fish Oil 1000 Mg Caps (Omega-3 Fatty Acids) .... Two Times A Day 9)  Vitamin C 500 Mg Tabs (Ascorbic Acid) .Marland Kitchen.. 1 Tab By Mouth Once Daily 10)  Meclizine Hcl 25 Mg Tabs (Meclizine Hcl) .Marland Kitchen.. 1 By Mouth Two Times A Day As Needed Vertigo 11)  Prevacid 24hr 15 Mg Cpdr (Lansoprazole) .... Take 1 Tablet By Mouth Once Daily  Allergies (verified): 1)  ! Lipitor (Atorvastatin Calcium) 2)  ! Pravachol (Pravastatin Sodium) 3)  ! Crestor (Rosuvastatin Calcium) 4)  ! Zetia (Ezetimibe)  Past History:  Past Medical History: Reviewed history from 02/14/2010 and no changes required. HYPERTENSION (ICD-401.9) HYPERLIPIDEMIA (ICD-272.4) CORONARY ARTERY DISEASE (ICD-414.00) CEREBROVASCULAR DISEASE (ICD-437.9) DIABETES MELLITUS, TYPE II (ICD-250.00) elevated PSA interstitial cystitis intolerant of statins Anemia  Hx. of blood loss PVD  Past Surgical History: Coronary artery bypass graft  2001 kidney stones  1972 Left Carpal tunnel release  06/09  Family History: father deceased- heart related at age 76 mother deceased 67 yo -no particular cause Family History of Colon Cancer: Uncles x 4 --Maternal  Family History of Diabetes: Mother Family History of Heart Disease: Mother,  Father  Social History: Married Former Smoker-stopped 2001 Regular exercise-no Alcohol Use - no Daily Caffeine Use-3-4 cups daily Illicit Drug Use - no Drug Use:  no  Review of Systems       The patient complains of fatigue.  The patient denies  allergy/sinus, anemia, anxiety-new, arthritis/joint pain, back pain, blood in urine, breast changes/lumps, change in vision, confusion, cough, coughing up blood, depression-new, fainting, headaches-new, hearing problems, heart murmur, heart rhythm changes, itching, menstrual pain, muscle pains/cramps, night sweats, nosebleeds, pregnancy symptoms, shortness of breath, skin rash, sleeping problems, sore throat, swelling of feet/legs, swollen lymph glands, thirst - excessive , urination - excessive , urination changes/pain, urine leakage, vision changes, voice change, and fever.    Vital Signs:  Patient profile:   77 year old male Height:      68.5 inches Weight:      216.38 pounds BMI:     32.54 BSA:     2.13 Pulse rate:   64 / minute Pulse rhythm:   regular BP sitting:   124 / 64  (left arm)  Vitals Entered By: Lamona Curl CMA Duncan Dull) (February 15, 2010 10:08 AM)  Physical Exam  General:  Well developed, well nourished, no acute distress. Head:  Normocephalic and atraumatic. Eyes:  PERRLA, no icterus. Nose:  No deformity, discharge,  or lesions. Mouth:  No deformity or lesions,. Neck:  Supple; no masses or thyromegaly. Lungs:  Clear throughout to auscultation. Heart:  Regular rate and rhythm; no murmurs, rubs,  or bruits. Abdomen:  Soft, nontender and nondistended. No masses, hepatosplenomegaly or hernias noted. Normal bowel sounds. Rectal:  deferred until colonoscopy Prostate:  deferred until colonoscopy Msk:  Symmetrical with no gross deformities. Normal posture. Pulses:  Normal pulses noted. Extremities:  No clubbing, cyanosis, edema or deformities noted. Neurologic:  Alert and  oriented x4;  grossly normal neurologically. Skin:  Intact without significant lesions or rashes. Psych:  Alert and cooperative. Normal mood and affect.   Impression & Recommendations:  Problem # 1:  ANEMIA-UNSPECIFIED (ICD-285.9) Mild normocytic anemia. Only GI symptom mild GERD controlled with  PPI. Only non-GI symptoms mild fatigue.  Plan: #1. Anemia panel to further characterize the anemia  Problem # 2:  GERD (ICD-530.81) chronic GERD without alarm symptoms.  Plan: #1. Reflux precautions #2. Continue Prevacid #3. Upper endoscopy to evaluate chronic GERD and white male smoker with mild anemia. Rule out Barrett's.. The nature of the procedure as well as the risks, benefits, and alternatives were reviewed. He understood and agreed to proceed. #4. hold diabetic medications the day of the procedure until patient resumes p.o. intake to avoid unwanted hypoglycemia  Problem # 3:  SCREENING COLORECTAL-CANCER (ICD-V76.51) the patient is an appropriate candidate without contraindication for colonoscopy. As well we will try to make sure that his anemia is not related to significant colonic pathology  Plan: #1. Colonoscopy. The nature of the procedure as well as the risks, benefits, and alternatives were reviewed. He understood and agreed to proceed #2. Movi prep prescribed. The patient instructed on its use #3. adjust diabetic medications as noted above  Problem # 4:  DIABETES MELLITUS, TYPE II (ICD-250.00) suggest diabetic medications for the procedure. We will also monitor his blood sugar immediately before and after the examinations  Problem # 5:  FAMILY HX COLON CANCER (ICD-V16.0) second degree relatives only   Other Orders: TLB-B12, Serum-Total ONLY (16109-U04) TLB-Ferritin (82728-FER) TLB-Folic Acid (Folate) (82746-FOL) TLB-IBC Pnl (Iron/FE;Transferrin) (83550-IBC)  Patient Instructions: 1)  Endo/Colon [facility] 03/26/10 2:00 pm  arrive at 1:00 pm 2)  Movi prep instructions given to patient. 3)  Movi prep Rx. sent to pharmacy. 4)  Hold Diabetic meds morning of procedure. 5)  Copy sent to : Bruce Swords 6)  Colonoscopy and Flexible Sigmoidoscopy brochure given.  7)  Upper Endoscopy brochure given.  8)  The medication list was reviewed and reconciled.  All changed / newly  prescribed medications were explained.  A complete medication list was provided to the patient / caregiver. Prescriptions: MOVIPREP 100 GM  SOLR (PEG-KCL-NACL-NASULF-NA ASC-C) As per prep instructions.  #1 x 0   Entered by:   Milford Cage NCMA   Authorized by:   Hilarie Fredrickson MD   Signed by:   Milford Cage NCMA on 02/15/2010   Method used:   Electronically to        [facility] [address] [phone]* (retail)       [address]       [place], [place]  [address]       Ph: [phone] or [phone]       Fax: [phone]   RxID:   [id number]

## 2010-07-26 NOTE — Assessment & Plan Note (Signed)
Summary: 4 month rov/njr   Vital Signs:  Patient profile:   77 year old male Weight:      220 pounds Temp:     98.6 degrees F oral Pulse rate:   88 / minute BP sitting:   114 / 72  (left arm) Cuff size:   large  Vitals Entered By: Alfred Levins, CMA (May 04, 2010 10:50 AM) CC: f/u   Primary Care Provider:  Birdie Sons, MD  CC:  f/u.  History of Present Illness:  Follow-Up Visit      This is a 77 year old man who presents for Follow-up visit.  The patient denies chest pain and palpitations.  Since the last visit the patient notes no new problems or concerns EXCEPT has had an abnormal conolonoscopy with large polyp---scheduled for surgery (reviewed pathology note).  The patient reports taking meds as prescribed.  When questioned about possible medication side effects, the patient notes none.    All other systems reviewed and were negative   Current Problems (verified): 1)  Preoperative Examination  (ICD-V72.84) 2)  Nonspecific Abn Finding Rad & Oth Exam Gi Tract  (ICD-793.4) 3)  Family Hx Colon Cancer  (ICD-V16.0) 4)  Screening Colorectal-cancer  (ICD-V76.51) 5)  Gerd  (ICD-530.81) 6)  Anemia-unspecified  (ICD-285.9) 7)  Anemia, Hx of  (ICD-V12.3) 8)  Vertigo  (ICD-780.4) 9)  Peripheral Vascular Disease  (ICD-443.9) 10)  Hypertension  (ICD-401.9) 11)  Hyperlipidemia  (ICD-272.4) 12)  Coronary Artery Disease  (ICD-414.00) 13)  Cerebrovascular Disease  (ICD-437.9) 14)  Interstitial Cystitis  (ICD-595.1) 15)  Psa, Increased  (ICD-790.93) 16)  Diabetes Mellitus, Type II  (ICD-250.00)  Current Medications (verified): 1)  Amlodipine Besylate 10 Mg Tabs (Amlodipine Besylate) .... One By Mouth Daily 2)  Hydrochlorothiazide 12.5 Mg Caps (Hydrochlorothiazide) .... Take 1 Capsule By Mouth Once A Day 3)  Lisinopril 20 Mg Tabs (Lisinopril) .... Take 1 Tablet By Mouth At Bedtime 4)  Metoprolol Tartrate 50 Mg Tabs (Metoprolol Tartrate) .Marland Kitchen.. 1 Tab Po Two Times A Day 5)  Glyburide  5 Mg Tabs (Glyburide) .... Take 2 Tablets in The Morning and 1 Tablet in The Evening Daily 6)  Janumet 50-1000 Mg  Tabs (Sitagliptin-Metformin Hcl) .... Take 1 Tablet By Mouth Two Times A Day 7)  Aspirin Ec 325 Mg  Tbec (Aspirin) .Marland Kitchen.. 1 Tab Po Daily 8)  Omega-3 Fish Oil 1000 Mg Caps (Omega-3 Fatty Acids) .... Two Times A Day 9)  Meclizine Hcl 25 Mg Tabs (Meclizine Hcl) .Marland Kitchen.. 1 By Mouth Two Times A Day As Needed Vertigo 10)  Prevacid 24hr 15 Mg Cpdr (Lansoprazole) .... Take 1 Tablet By Mouth Once Daily  Allergies (verified): 1)  ! Lipitor (Atorvastatin Calcium) 2)  ! Pravachol 3)  ! Crestor (Rosuvastatin Calcium) 4)  ! Zetia (Ezetimibe)  Past History:  Past Medical History: Last updated: 02/14/2010 HYPERTENSION (ICD-401.9) HYPERLIPIDEMIA (ICD-272.4) CORONARY ARTERY DISEASE (ICD-414.00) CEREBROVASCULAR DISEASE (ICD-437.9) DIABETES MELLITUS, TYPE II (ICD-250.00) elevated PSA interstitial cystitis intolerant of statins Anemia  Hx. of blood loss PVD  Past Surgical History: Last updated: 02-19-2010 Coronary artery bypass graft  2001 kidney stones  1972 Left Carpal tunnel release  06/09  Family History: Last updated: February 19, 2010 father deceased- heart related at age 36 mother deceased 4 yo -no particular cause Family History of Colon Cancer: Uncles x 4 --Maternal  Family History of Diabetes: Mother Family History of Heart Disease: Mother, Father  Social History: Last updated: Feb 19, 2010 Married Former Smoker-stopped 2001 Regular exercise-no Alcohol Use -  no Daily Caffeine Use-3-4 cups daily Illicit Drug Use - no  Risk Factors: Exercise: no (02/16/2007)  Risk Factors: Smoking Status: quit > 6 months (12/28/2009)  Physical Exam  General:  well-developed well-nourished male in no acute distress. HEENT exam atraumatic, normocephalic symmetric muscles are intact. Neck is supple. Chest auscultation. Cardiac exam S1-S2 are regular. Abdominal exam overweight, tumescence,  soft her extremities is no clubbing cyanosis or edema.   Impression & Recommendations:  Problem # 1:  ANEMIA-UNSPECIFIED (ICD-285.9) will not check labs today Hgb: 12.9 (12/28/2009)   Hct: 38.5 (12/28/2009)   Platelets: 212.0 (12/28/2009) RBC: 4.28 (12/28/2009)   RDW: 13.8 (12/28/2009)   WBC: 6.9 (12/28/2009) MCV: 89.8 (12/28/2009)   MCHC: 33.7 (12/28/2009) Ferritin: 15.3 (02/15/2010) Iron: 68 (02/15/2010)   % Sat: 14.8 (02/15/2010) B12: 312 (02/15/2010)   Folate: >20.0 ng/mL (02/15/2010)   TSH: 2.19 (12/28/2009)  Problem # 2:  HYPERTENSION (ICD-401.9) controlled continue current medications  His updated medication list for this problem includes:    Amlodipine Besylate 10 Mg Tabs (Amlodipine besylate) ..... One by mouth daily    Hydrochlorothiazide 12.5 Mg Caps (Hydrochlorothiazide) .Marland Kitchen... Take 1 capsule by mouth once a day    Lisinopril 20 Mg Tabs (Lisinopril) .Marland Kitchen... Take 1 tablet by mouth at bedtime    Metoprolol Tartrate 50 Mg Tabs (Metoprolol tartrate) .Marland Kitchen... 1 tab po two times a day  BP today: 114/72 Prior BP: 130/64 (05/02/2010)  Labs Reviewed: K+: 4.5 (04/27/2010) Creat: : 1.5 (04/27/2010)   Chol: 185 (04/27/2010)   HDL: 34.00 (04/27/2010)   LDL: 105 (06/02/2009)   TG: 293.0 (04/27/2010)  Problem # 3:  HYPERLIPIDEMIA (ICD-272.4) no Rx needed Labs Reviewed: SGOT: 24 (04/27/2010)   SGPT: 23 (04/27/2010)   HDL:34.00 (04/27/2010), 34.20 (12/28/2009)  LDL:105 (06/02/2009), 92 (88/41/6606)  Chol:185 (04/27/2010), 193 (12/28/2009)  Trig:293.0 (04/27/2010), 328.0 (12/28/2009)  Problem # 4:  DIABETES MELLITUS, TYPE II (ICD-250.00) reasonable control continue current medications  His updated medication list for this problem includes:    Lisinopril 20 Mg Tabs (Lisinopril) .Marland Kitchen... Take 1 tablet by mouth at bedtime    Glyburide 5 Mg Tabs (Glyburide) .Marland Kitchen... Take 2 tablets in the morning and 1 tablet in the evening daily    Janumet 50-1000 Mg Tabs (Sitagliptin-metformin hcl) .Marland Kitchen... Take 1  tablet by mouth two times a day    Aspirin Ec 325 Mg Tbec (Aspirin) .Marland Kitchen... 1 tab po daily  Labs Reviewed: Creat: 1.5 (04/27/2010)     Last Eye Exam: normalpt's report (12/22/2009) Reviewed HgBA1c results: 7.7 (04/27/2010)  7.6 (12/28/2009) colon polyp: Scheduled for surgery.  Complete Medication List: 1)  Amlodipine Besylate 10 Mg Tabs (Amlodipine besylate) .... One by mouth daily 2)  Hydrochlorothiazide 12.5 Mg Caps (Hydrochlorothiazide) .... Take 1 capsule by mouth once a day 3)  Lisinopril 20 Mg Tabs (Lisinopril) .... Take 1 tablet by mouth at bedtime 4)  Metoprolol Tartrate 50 Mg Tabs (Metoprolol tartrate) .Marland Kitchen.. 1 tab po two times a day 5)  Glyburide 5 Mg Tabs (Glyburide) .... Take 2 tablets in the morning and 1 tablet in the evening daily 6)  Janumet 50-1000 Mg Tabs (Sitagliptin-metformin hcl) .... Take 1 tablet by mouth two times a day 7)  Aspirin Ec 325 Mg Tbec (Aspirin) .Marland Kitchen.. 1 tab po daily 8)  Omega-3 Fish Oil 1000 Mg Caps (Omega-3 fatty acids) .... Two times a day 9)  Meclizine Hcl 25 Mg Tabs (Meclizine hcl) .Marland Kitchen.. 1 by mouth two times a day as needed vertigo 10)  Prevacid 24hr 15 Mg Cpdr (  Lansoprazole) .... Take 1 tablet by mouth once daily  Patient Instructions: 1)  Please schedule a follow-up appointment in 4 months. 2)  labs one week prior to visit 3)  lipids---272.4 4)  lfts-995.2 5)  bmet-995.2 6)  A1C-250.02 7)       Orders Added: 1)  Est. Patient Level IV [16109]

## 2010-07-26 NOTE — Assessment & Plan Note (Signed)
Summary: DEEP COUGH/SW PT/PS   Vital Signs:  Patient profile:   77 year old male Weight:      212 pounds Temp:     98.1 degrees F oral BP sitting:   130 / 60  (left arm) Cuff size:   regular  Vitals Entered By: Sid Falcon LPN (Nov 18, 2008 4:34 PM) CC: Deep cough with runny nose, sneezing X 11 days, no fever reported   History of Present Illness: Patient seen as a work in with cough for about 11-12 days now. He initially starts sore throat approximately 2 weeks ago then developed nasal congestion and now cough mostly productive of whitish sputum occasionally brown sputum. No definite fevers. Denies any pleuritic pain. No hemoptysis. Ex-smoker quit in 2001.  Chronic problems include type 2 diabetes, hyperlipidemia, hypertension, and coronary artery disease with history of bypass in 2001. Blood sugars have been stable.  Allergies: 1)  ! Lipitor (Atorvastatin Calcium) 2)  ! Pravachol (Pravastatin Sodium) 3)  ! Crestor (Rosuvastatin Calcium) 4)  ! Zetia (Ezetimibe)  Past History:  Past Medical History: Last updated: 01/21/2007 Coronary artery disease Diabetes mellitus, type II Hyperlipidemia Hypertension elevated PSA interstitial cystitis intolerant of statins  Social History: Last updated: 02/16/2007 Married Former Smoker Regular exercise-no  Review of Systems      See HPI  Physical Exam  General:  Well-developed,well-nourished,in no acute distress; alert,appropriate and cooperative throughout examination Ears:  External ear exam shows no significant lesions or deformities.  Otoscopic examination reveals clear canals, tympanic membranes are intact bilaterally without bulging, retraction, inflammation or discharge. Hearing is grossly normal bilaterally. Mouth:  Oral mucosa and oropharynx without lesions or exudates.  Teeth in good repair. Neck:  No deformities, masses, or tenderness noted. Lungs:  slightly diminished breath sounds throughout but symmetric breath  sounds. Few very faint wheezes were noted but no rales Heart:  regular rhythm and rate   Impression & Recommendations:  Problem # 1:  COUGH (ICD-786.2) Given persistence of symptoms we'll go ahead with antibiotic. Follow up promptly if symptoms worsen or if cough persists.  Complete Medication List: 1)  Amlodipine Besylate 10 Mg Tabs (Amlodipine besylate) .... One by mouth daily 2)  Hydrochlorothiazide 12.5 Mg Caps (Hydrochlorothiazide) .... Take 1 capsule by mouth once a day 3)  Lisinopril 20 Mg Tabs (Lisinopril) .... Take 1 tablet by mouth at bedtime 4)  Metoprolol Tartrate 50 Mg Tabs (Metoprolol tartrate) .... Bid 5)  Glyburide 5 Mg Tabs (Glyburide) .... Take 2 tablets in the morning and 1 tablet in the evening daily 6)  Janumet 50-1000 Mg Tabs (Sitagliptin-metformin hcl) .... Take 1 tablet by mouth two times a day 7)  Aspirin Ec 325 Mg Tbec (Aspirin) .... Daily 8)  Omega-3 Fish Oil 1000 Mg Caps (Omega-3 fatty acids) .... Two times a day 9)  Azithromycin 250 Mg Tabs (Azithromycin) .... 2 by mouth today then 1 once daily for 4 days 10)  Hydrocodone-homatropine 5-1.5 Mg/47ml Syrp (Hydrocodone-homatropine) .... One tsp by mouth q 4-6 hours as needed cough Prescriptions: HYDROCODONE-HOMATROPINE 5-1.5 MG/5ML SYRP (HYDROCODONE-HOMATROPINE) one tsp by mouth q 4-6 hours as needed cough  #120 ml x 0   Entered and Authorized by:   Evelena Peat MD   Signed by:   Evelena Peat MD on 11/18/2008   Method used:   Print then Give to Patient   RxID:   [id number] AZITHROMYCIN 250 MG TABS (AZITHROMYCIN) 2 by mouth today then 1 once daily for 4 days  #6 x 0  Entered and Authorized by:   Evelena Peat MD   Signed by:   Evelena Peat MD on 11/18/2008   Method used:   Print then Give to Patient   RxID:   [id number]

## 2010-07-26 NOTE — Assessment & Plan Note (Signed)
Summary: F1Y/DM      Allergies Added:   Visit Type:  Follow-up Primary Provider:  Birdie Sons MD   History of Present Illness: Clayton Harrison is a very pleasant gentleman who has a history of coronary artery disease, status post coronary artery bypass graft in June 2001. Last Myoview in July of 2009 was low risk with normal LV function, mild apical thinning and mild apical ischemia. Last carotid Dopplers in Jan 2011 showed a 60-79% bilateral stenosis. Followup recommended in 6 months. ABIs Jan 2011 were normal. Abdominal ultrasound in June of 2006 showed no aneurysm. I last saw him in June of 2010. Since then he does have some dyspnea with exertion but it is relieved with rest. There is no associated chest pain. There is no exertional chest pain, orthopnea, PND, pedal edema or syncope. He occasionally feels mild dizziness with standing transiently.  Current Medications (verified): 1)  Amlodipine Besylate 10 Mg Tabs (Amlodipine Besylate) .... One By Mouth Daily 2)  Hydrochlorothiazide 12.5 Mg Caps (Hydrochlorothiazide) .... Take 1 Capsule By Mouth Once A Day 3)  Lisinopril 20 Mg Tabs (Lisinopril) .... Take 1 Tablet By Mouth At Bedtime 4)  Metoprolol Tartrate 50 Mg Tabs (Metoprolol Tartrate) .Marland Kitchen.. 1 Tab Po Two Times A Day 5)  Glyburide 5 Mg Tabs (Glyburide) .... Take 2 Tablets in The Morning and 1 Tablet in The Evening Daily 6)  Janumet 50-1000 Mg  Tabs (Sitagliptin-Metformin Hcl) .... Take 1 Tablet By Mouth Two Times A Day 7)  Aspirin Ec 325 Mg  Tbec (Aspirin) .Marland Kitchen.. 1 Tab Po Daily 8)  Omega-3 Fish Oil 1000 Mg Caps (Omega-3 Fatty Acids) .... Two Times A Day 9)  Vitamin C 500 Mg Tabs (Ascorbic Acid) .Marland Kitchen.. 1 Tab By Mouth Once Daily 10)  Meclizine Hcl 25 Mg Tabs (Meclizine Hcl) .Marland Kitchen.. 1 By Mouth Two Times A Day As Needed Vertigo  Allergies (verified): 1)  ! Lipitor (Atorvastatin Calcium) 2)  ! Pravachol (Pravastatin Sodium) 3)  ! Crestor (Rosuvastatin Calcium) 4)  ! Zetia (Ezetimibe)  Past  History:  Past Medical History: HYPERTENSION (ICD-401.9) HYPERLIPIDEMIA (ICD-272.4) CORONARY ARTERY DISEASE (ICD-414.00) CEREBROVASCULAR DISEASE (ICD-437.9) DIABETES MELLITUS, TYPE II (ICD-250.00) elevated PSA interstitial cystitis intolerant of statins  Past Surgical History: Reviewed history from 01/25/2008 and no changes required. Coronary artery bypass graft  2001 kidney stones  1972 Carpal tunnel release  06/09  Social History: Reviewed history from 02/16/2007 and no changes required. Married Former Smoker Regular exercise-no  Review of Systems       no fevers or chills, productive cough, hemoptysis, dysphasia, odynophagia, melena, hematochezia, dysuria, hematuria, rash, seizure activity, orthopnea, PND, pedal edema, claudication. Remaining systems are negative.   Vital Signs:  Patient profile:   77 year old male Height:      68.5 inches Weight:      216 pounds BMI:     32.48 Pulse rate:   63 / minute BP sitting:   122 / 70  (left arm)  Vitals Entered By: Laurance Flatten CMA (January 16, 2010 9:36 AM)  Physical Exam  General:  Well-developed well-nourished in no acute distress.  Skin is warm and dry.  HEENT is normal.  Neck is supple. No thyromegaly.  Chest is clear to auscultation with normal expansion.  Cardiovascular exam is regular rate and rhythm.  Abdominal exam nontender or distended. No masses palpated. Extremities show no edema. neuro grossly intact    EKG  Procedure date:  01/16/2010  Findings:      Sinus rhythm  at a rate of 63. Anterior T-wave inversion.  Impression & Recommendations:  Problem # 1:  CORONARY ARTERY DISEASE (ICD-414.00) Continue aspirin, beta blocker, ACE inhibitor. He is intolerant to statins. Plan repeat Myoview when he return in one year. His updated medication list for this problem includes:    Amlodipine Besylate 10 Mg Tabs (Amlodipine besylate) ..... One by mouth daily    Lisinopril 20 Mg Tabs (Lisinopril) .Marland Kitchen... Take 1  tablet by mouth at bedtime    Metoprolol Tartrate 50 Mg Tabs (Metoprolol tartrate) .Marland Kitchen... 1 tab po two times a day    Aspirin Ec 325 Mg Tbec (Aspirin) .Marland Kitchen... 1 tab po daily  Problem # 2:  HYPERTENSION (ICD-401.9) Blood pressure controlled on present medications. Will continue. Renal function and potassium monitored by primary care. His updated medication list for this problem includes:    Amlodipine Besylate 10 Mg Tabs (Amlodipine besylate) ..... One by mouth daily    Hydrochlorothiazide 12.5 Mg Caps (Hydrochlorothiazide) .Marland Kitchen... Take 1 capsule by mouth once a day    Lisinopril 20 Mg Tabs (Lisinopril) .Marland Kitchen... Take 1 tablet by mouth at bedtime    Metoprolol Tartrate 50 Mg Tabs (Metoprolol tartrate) .Marland Kitchen... 1 tab po two times a day    Aspirin Ec 325 Mg Tbec (Aspirin) .Marland Kitchen... 1 tab po daily  Problem # 3:  HYPERLIPIDEMIA (ICD-272.4) Continue diet. Intolerant to statins.  Problem # 4:  CEREBROVASCULAR DISEASE (ICD-437.9) Continue aspirin. Followup carotid Dopplers today. Will most likely need carotid endarterectomy in the future.  Problem # 5:  DIABETES MELLITUS, TYPE II (ICD-250.00) Management per primary care. His updated medication list for this problem includes:    Lisinopril 20 Mg Tabs (Lisinopril) .Marland Kitchen... Take 1 tablet by mouth at bedtime    Glyburide 5 Mg Tabs (Glyburide) .Marland Kitchen... Take 2 tablets in the morning and 1 tablet in the evening daily    Janumet 50-1000 Mg Tabs (Sitagliptin-metformin hcl) .Marland Kitchen... Take 1 tablet by mouth two times a day    Aspirin Ec 325 Mg Tbec (Aspirin) .Marland Kitchen... 1 tab po daily  Patient Instructions: 1)  Your physician recommends that you schedule a follow-up appointment in: 1 year with Dr Jens Som 2)  Your physician recommends that you continue on your current medications as directed. Please refer to the Current Medication list given to you today.

## 2010-07-26 NOTE — Progress Notes (Signed)
Summary: pt req refill of [facility] to [facility]. Pt req qty 30  Phone Note Call from Patient   Caller: Patient Summary of Call: Pt called and said that the Meclizine is working if pt takes them 1-2 times a day. It is helping dizziness. Pt is req a refill to [facility] and would like qty 30 if possible. Pt would like a call back.  Initial call taken by: Lucy Antigua,  March 27, 2009 2:20 PM  Follow-up for Phone Call        was given #20 x1 in Aug and #20x1 in Sept.  OK to give him #30 or do you want to re-evaluate Follow-up by: Gladis Riffle, RN,  March 27, 2009 2:42 PM  Additional Follow-up for Phone Call Additional follow up Details #1::        See Rx below.Patient notified.  Additional Follow-up by: Gladis Riffle, RN,  March 28, 2009 11:15 AM    Prescriptions: MECLIZINE HCL 25 MG TABS (MECLIZINE HCL) 1 by mouth two times a day as needed vertigo  #60 x 3   Entered and Authorized by:   Birdie Sons MD   Signed by:   Birdie Sons MD on 03/27/2009   Method used:   [facility]        [facility] [address] [phone]* (retail)       [address]       [place], [place]  [address]       Ph: [phone] or [phone]       Fax: [phone]   RxID:   [id number]

## 2010-07-26 NOTE — Letter (Signed)
Summary: [facility]   Imported By: Sherian Rein 06/26/2010 11:14:09  _____________________________________________________________________  External Attachment:    Type:   Image     Comment:   External Document

## 2010-07-26 NOTE — Assessment & Plan Note (Signed)
Summary: 4 MONTH ROV/NJR   Vital Signs:  Patient profile:   77 year old male Height:      68.5 inches Weight:      211 pounds BMI:     31.73 Pulse rate:   60 / minute Resp:     12 per minute BP sitting:   130 / 56  (left arm)  Vitals Entered By: Gladis Riffle, RN (February 01, 2009 8:38 AM)  History of Present Illness:  Follow-Up Visit      This is a 77 year old man who presents for Follow-up visit.  The patient denies chest pain, palpitations, dizziness, syncope, low blood sugar symptoms, high blood sugar symptoms, edema, SOB, DOE, PND, and orthopnea.  Since the last visit the patient notes no new problems or concerns.  The patient reports taking meds as prescribed.  When questioned about possible medication side effects, the patient notes none.   only new compliant is lightheadedness when he stands to quickly from seated or "bending over position"  All other systems reviewed and were negative   Allergies: 1)  ! Lipitor (Atorvastatin Calcium) 2)  ! Pravachol (Pravastatin Sodium) 3)  ! Crestor (Rosuvastatin Calcium) 4)  ! Zetia (Ezetimibe)  Comments:  Nurse/Medical Assistant: 4 month rov; CBGs 160-200 at home--discuss janumet as is dizzy since starting it  The patient's medications and allergies were reviewed with the patient and were updated in the Medication and Allergy Lists. Gladis Riffle, RN (February 01, 2009 8:39 AM)  Past History:  Past Medical History: Last updated: 12/10/2008 Current Problems:  HYPERTENSION (ICD-401.9) HYPERLIPIDEMIA (ICD-272.4) CORONARY ARTERY DISEASE (ICD-414.00) CEREBROVASCULAR DISEASE (ICD-437.9) COUGH (ICD-786.2) SPECIAL SCREENING FOR MALIGNANT NEOPLASMS COLON (ICD-V76.51) CLAUDICATION (ICD-443.9) INTERSTITIAL CYSTITIS (ICD-595.1) PSA, INCREASED (ICD-790.93) DIABETES MELLITUS, TYPE II (ICD-250.00)  elevated PSA interstitial cystitis intolerant of statins  Past Surgical History: Last updated: 01/25/2008 Coronary artery bypass graft  2001  kidney stones  1972 Carpal tunnel release  06/09  Family History: Last updated: 19-Feb-2007 father deceased- heart related at age 77 mother deceased 15 yo -no particular cause  Social History: Last updated: 02/19/2007 Married Former Smoker Regular exercise-no  Risk Factors: Exercise: no (02-19-2007)  Risk Factors: Smoking Status: quit (Feb 19, 2007)  Physical Exam  General:  Well-developed,well-nourished,in no acute distress; alert,appropriate and cooperative throughout examination Head:  normocephalic and atraumatic.   Eyes:  pupils equal and pupils round.   Ears:  R ear normal and L ear normal.   Neck:  No deformities, masses, or tenderness noted. Chest Wall:  No deformities, masses, tenderness or gynecomastia noted. Lungs:  normal respiratory effort and no intercostal retractions.   Heart:  normal rate and regular rhythm.   Abdomen:  Bowel sounds positive,abdomen soft and non-tender without masses, organomegaly or hernias noted. Msk:  No deformity or scoliosis noted of thoracic or lumbar spine.   Neurologic:  cranial nerves II-XII intact and gait normal.     Impression & Recommendations:  Problem # 1:  HYPERLIPIDEMIA (ICD-272.4)  on no meds but LDL less than 100 check today Labs Reviewed: SGOT: 20 (09/28/2008)   SGPT: 22 (09/28/2008)   HDL:31.60 (09/28/2008), 30.1 (05/26/2008)  LDL:92 (05/26/2008), DEL (04/54/0981)  Chol:167 (09/28/2008), 157 (05/26/2008)  Trig:214.0 (09/28/2008), 177 (05/26/2008)  Orders: TLB-Lipid Panel (80061-LIPID) TLB-Hepatic/Liver Function Pnl (80076-HEPATIC) TLB-TSH (Thyroid Stimulating Hormone) (84443-TSH)  Problem # 2:  HYPERTENSION (ICD-401.9)  I wonder if some of his sxs are orthostatic (could be vertigo) given that BP is only fairly controlled will try meclizine first (nightly) His updated medication list  for this problem includes:    Amlodipine Besylate 10 Mg Tabs (Amlodipine besylate) ..... One by mouth daily     Hydrochlorothiazide 12.5 Mg Caps (Hydrochlorothiazide) .Marland Kitchen... Take 1 capsule by mouth once a day    Lisinopril 20 Mg Tabs (Lisinopril) .Marland Kitchen... Take 1 tablet by mouth at bedtime    Metoprolol Tartrate 50 Mg Tabs (Metoprolol tartrate) .Marland Kitchen... 1 tab po two times a day  BP today: 130/56 Prior BP: 140/70 (12/15/2008)  Labs Reviewed: K+: 4.3 (09/28/2008) Creat: : 1.2 (09/28/2008)   Chol: 167 (09/28/2008)   HDL: 31.60 (09/28/2008)   LDL: 92 (05/26/2008)   TG: 214.0 (09/28/2008)  Orders: Venipuncture (91478) TLB-BMP (Basic Metabolic Panel-BMET) (80048-METABOL)  Problem # 3:  CORONARY ARTERY DISEASE (ICD-414.00) no sxs His updated medication list for this problem includes:    Amlodipine Besylate 10 Mg Tabs (Amlodipine besylate) ..... One by mouth daily    Hydrochlorothiazide 12.5 Mg Caps (Hydrochlorothiazide) .Marland Kitchen... Take 1 capsule by mouth once a day    Lisinopril 20 Mg Tabs (Lisinopril) .Marland Kitchen... Take 1 tablet by mouth at bedtime    Metoprolol Tartrate 50 Mg Tabs (Metoprolol tartrate) .Marland Kitchen... 1 tab po two times a day    Aspirin Ec 325 Mg Tbec (Aspirin) .Marland Kitchen... 1 tab po daily  Problem # 4:  CEREBROVASCULAR DISEASE (ICD-437.9) no recurrent sxs  Complete Medication List: 1)  Amlodipine Besylate 10 Mg Tabs (Amlodipine besylate) .... One by mouth daily 2)  Hydrochlorothiazide 12.5 Mg Caps (Hydrochlorothiazide) .... Take 1 capsule by mouth once a day 3)  Lisinopril 20 Mg Tabs (Lisinopril) .... Take 1 tablet by mouth at bedtime 4)  Metoprolol Tartrate 50 Mg Tabs (Metoprolol tartrate) .Marland Kitchen.. 1 tab po two times a day 5)  Glyburide 5 Mg Tabs (Glyburide) .... Take 2 tablets in the morning and 1 tablet in the evening daily 6)  Janumet 50-1000 Mg Tabs (Sitagliptin-metformin hcl) .... Take 1 tablet by mouth two times a day 7)  Aspirin Ec 325 Mg Tbec (Aspirin) .Marland Kitchen.. 1 tab po daily 8)  Omega-3 Fish Oil 1000 Mg Caps (Omega-3 fatty acids) .... Two times a day 9)  Vitamin C 500 Mg Tabs (Ascorbic acid) .Marland Kitchen.. 1 tab by mouth  once daily 10)  Meclizine Hcl 25 Mg Tabs (Meclizine hcl) .Marland Kitchen.. 1 by mouth two times a day as needed vertigo  Other Orders: Tetanus Toxoid w/Dx [phone]) Admin 1st Vaccine (13086) Admin 1st Vaccine (State) (90471S) TLB-A1C / Hgb A1C (Glycohemoglobin) [phone])  Preventive Care Screening  Last Tetanus Booster:    Date:  02/01/2009    Results:  Td  Last Pneumovax:    Date:  06/24/2008    Results:  given-pt's report    Patient Instructions: 1)  Please schedule a follow-up appointment in 4 months. Prescriptions: MECLIZINE HCL 25 MG TABS (MECLIZINE HCL) 1 by mouth two times a day as needed vertigo  #20 x 0   Entered and Authorized by:   Birdie Sons MD   Signed by:   Birdie Sons MD on 02/01/2009   Method used:   [facility]        [facility] [address] [phone][facility] (retail)       [address]       [place], [place]  [address]       Ph: [phone] or [phone]       Fax: [phone]   RxID:   [id number]    Tetanus/Td Vaccine    Vaccine Type: Td  Site: left deltoid    Mfr: Sanofi Pasteur    Dose: 0.5 ml    Route: IM    Given by: Gladis Riffle, RN    Exp. Date: 07/21/2010    Lot #: [id number]

## 2010-07-26 NOTE — Procedures (Signed)
Summary: Upper Endoscopy  Patient: Clayton Harrison Note: All result statuses are Final unless otherwise noted.  Tests: (1) Upper Endoscopy (EGD)   EGD Upper Endoscopy       DONE     [facility]     [address]     [place], [place]  [address]           ENDOSCOPY PROCEDURE REPORT           PATIENT:  [name], [name]  MR#:  [id number]     BIRTHDATE:  1930/07/05, 79 yrs. old  GENDER:  male           ENDOSCOPIST:  Wilhemina Bonito. Eda Keys, MD     Referred by:  Birdie Sons, M.D.           PROCEDURE DATE:  03/26/2010     PROCEDURE:  EGD with biopsy     ASA CLASS:  Class II     INDICATIONS:  GERD, iron deficiency anemia           MEDICATIONS:   There was residual sedation effect present from     prior procedure.     TOPICAL ANESTHETIC:  none           DESCRIPTION OF PROCEDURE:   After the risks benefits and     alternatives of the procedure were thoroughly explained, informed     consent was obtained.  The LB GIF-H180 [id number] endoscope was     introduced through the mouth and advanced to the second portion of     the duodenum, without limitations.  The instrument was slowly     withdrawn as the mucosa was fully examined.     <<PROCEDUREIMAGES>>           The esophagus and gastroesophageal junction were completely normal     in appearance save tiny tongue of columnar type muscosa.  A few     tiny erosions were found in the antrum. CLO bx taken. The duodenal     bulb was normal in appearance, as was the postbulbar duodenum.     Retroflexed views revealed no abnormalities.    The scope was then     withdrawn from the patient and the procedure completed.           COMPLICATIONS:  None           ENDOSCOPIC IMPRESSION:     1) Normal esophagus     2) Erosions in the antrum     3) Normal duodenum     4) GERD     RECOMMENDATIONS:     1) Continue Prevacid for GERD     2) Rx CLO if positive           _____________________________     Wilhemina Bonito. Eda Keys, MD           CC:  Lindley Magnus, MD, The Patient           n.     eSIGNEDWilhemina Bonito. Eda Keys at 03/26/2010 03:15 PM           Clayton Harrison, [id number]  Note: An exclamation mark (!) indicates a result that was not dispersed into the flowsheet. Document Creation Date: 03/26/2010 3:15 PM _______________________________________________________________________  (1) Order result status: Final Collection or observation date-time: 03/26/2010 15:06 Requested date-time:  Receipt date-time:  Reported date-time:  Referring Physician:   Ordering Physician: Fransico Setters [phone]) Specimen Source:  Source:  Launa Grill Order Number: [id number] Lab site:

## 2010-07-26 NOTE — Miscellaneous (Signed)
Summary: clotest  Clinical Lists Changes  Orders: Added new Test order of TLB-H Pylori Screen Gastric Biopsy (83013-CLOTEST) - Signed 

## 2010-07-26 NOTE — Progress Notes (Signed)
  Spoke to pt, and informed of negative leg study and Dr. Cato Mulligan suggestion for exercise study.  Advised the pt that Clayton Harrison would call him when she arranges this.

## 2010-07-26 NOTE — Miscellaneous (Signed)
Summary: Surgical pathology report  SP-Surgical Pathology - STATUS: Final  .                                         Perform Date: 1 Dec11 00:01  Ordered By: Patrici Ranks Date:  Facility: [facility]                              Department: CPATH  Service Report Text  [facility]   [address]   [place] , [place] [address]   Telephone : [phone] Fax : [phone]    REPORT OF SURGICAL PATHOLOGY    Accession #: [id number]   Patient Name: Clayton Harrison, Clayton Harrison   Visit # : [id number]    MRN: [id number]   Physician: Emelia Loron   DOB/Age 07-02-1930 (Age: 77) Gender: M   Collected Date: 05/24/2010   Received Date: 05/25/2010    FINAL DIAGNOSIS    1. Colon, segmental resection for tumor, right :   - TUBULOVILLOUS ADENOMA INVOLVING RIGHT COLON WITH NO EVIDENCE OF HIGH GRADE   GLANDULAR   DYSPLASIA OR INVASIVE CARCINOMA   - NO ADENOMATOUS EPITHELIUM OR MALIGNANCY IDENTIFIED AT DISTAL OR PROXIMAL   SURGICAL   MARGINS OF RESECTION   - NO TUMOR IDENTIFIED IN FOURTEEN MESENTERIC LYMPH NODES   - BENIGN SMALL BOWEL TISSUE   - BENIGN APPENDIX    DATE SIGNED OUT: 05/28/2010   ELECTRONIC SIGNATURE : Smir Md, Bassam, Pathologist, Electronic Signature    MICROSCOPIC DESCRIPTION    CASE COMMENTS    CLINICAL HISTORY    SPECIMEN(S) OBTAINED   1. Colon, segmental resection for tumor, Right    SPECIMEN COMMENTS:   SPECIMEN CLINICAL INFORMATION:   1. Right colon mass (las)    Gross Description   1. Specimen: Received in fixative, designated extended right colon, is a right   hemicolectomy specimen consisting of terminal ileum, cecum, and right colon, as   well as a grossly unremarkable vermiform appendix.   Length: 43 cm (post fixation).   Appendix: The vermiform appendix measures 8.5 cm in length x 0.5 cm in   diameter. The serosa is pink tan, smooth and dull. No evidence of hemorrhage   or perforation is noted. Sections through the  appendix reveal no intraluminal   lesions.   Serosa: Pink tan, smooth, dull, and displays scattered adhesions. There is a   moderate amount of adherent tan yellow, lobulated pericolonic and mesenteric   adipose tissue radiating up to 8.0 cm.   Macroscopic perforation of peritoneum: No.   Tumor deposits (discontinuous deposits or satellite nodules): No.   Tumor location: The specimen is opened along its length, revealing a pink tan,   fungating, sessile polyp located on the antimesenteric aspect of the right   colon. This polyp is located 2 cm proximal to an area of submucosal tattooing.   Tumor size (cm): The polyp measures 2.5 x 0.9 x 0.4 cm.   % Circumference involved by tumor: Approximately 20%.   Depth of invasion: Sections through the polyp reveal a pink tan, friable cut   surface. No areas of invasion are noted grossly.   Margins: proximal - 22 cm; distal - 16 cm; radial - 9 cm.   Other mucosal lesions: The uninvolved mucosa  is pink tan, velvety, and normally   folded. The previously described area of submucosal tattooing is somewhat firm   to palpation and will be submitted.   Lymph nodes: The adipose tissue is placed in a clearing solution, yielding 15   possible lymph nodes ranging from 0.2 to 0.7 cm.   Block summary:  A = proximal and distal resection margins.   B = appendix.   C through E = polyp, entirely submitted.   F = area of submucosal tattooing, 2 cm distal to polyp.   G = five possible lymph nodes.   H = five possible lymph nodes.   I = five possible lymph nodes.   Total = nine blocks. JC/eps 05/25/10    Report signed out from the following location(s)   [place]   [address] [place], [place] [address] CLIA #: [id number]    [facility]   [address] [place], [place] [address] CLIA #: [id number]  Additional Information  HL7 RESULT STATUS : F  External IF Update Timestamp : 2010-05-28:21:41:00.000000 Clinical Lists  Changes

## 2010-07-26 NOTE — Assessment & Plan Note (Signed)
Summary: ROV/F/U MYOVIEW/PRE-OP/DM    Visit Type:  Follow-up Referring Provider:  Birdie Sons, MD Primary Provider:  Birdie Sons, MD   History of Present Illness: Clayton Harrison is a very pleasant gentleman who has a history of coronary artery disease, status post coronary artery bypass graft in June 2001. Last Myoview in November of 2011 revealed EF of 61% and Fixed apical perfusion defect representing prior MI versus attenuation.  Fixed basal anteroseptal perfusion defect is likely an artifact due to "short septum". Last carotid Dopplers in July 2011 showed a 60-79% bilateral stenosis. Followup recommended in 6 months. ABIs Jan 2011 were normal. Abdominal ultrasound in June of 2006 showed no aneurysm. I last saw him in July of 2011. Since then the patient denies any dyspnea on exertion, orthopnea, PND, pedal edema, palpitations, syncope or chest pain.   Current Medications (verified): 1)  Amlodipine Besylate 10 Mg Tabs (Amlodipine Besylate) .... One By Mouth Daily 2)  Hydrochlorothiazide 12.5 Mg Caps (Hydrochlorothiazide) .... Take 1 Capsule By Mouth Once A Day 3)  Lisinopril 20 Mg Tabs (Lisinopril) .... Take 1 Tablet By Mouth At Bedtime 4)  Metoprolol Tartrate 50 Mg Tabs (Metoprolol Tartrate) .Marland Kitchen.. 1 Tab Po Two Times A Day 5)  Glyburide 5 Mg Tabs (Glyburide) .... Take 2 Tablets in The Morning and 1 Tablet in The Evening Daily 6)  Janumet 50-1000 Mg  Tabs (Sitagliptin-Metformin Hcl) .... Take 1 Tablet By Mouth Two Times A Day 7)  Aspirin Ec 325 Mg  Tbec (Aspirin) .Marland Kitchen.. 1 Tab Po Daily 8)  Omega-3 Fish Oil 1000 Mg Caps (Omega-3 Fatty Acids) .... Two Times A Day 9)  Meclizine Hcl 25 Mg Tabs (Meclizine Hcl) .Marland Kitchen.. 1 By Mouth Two Times A Day As Needed Vertigo 10)  Prevacid 24hr 15 Mg Cpdr (Lansoprazole) .... Take 1 Tablet By Mouth Once Daily  Allergies: 1)  ! Lipitor (Atorvastatin Calcium) 2)  ! Pravachol 3)  ! Crestor (Rosuvastatin Calcium) 4)  ! Zetia (Ezetimibe)  Past History:  Past Medical  History: Last updated: 02/14/2010 HYPERTENSION (ICD-401.9) HYPERLIPIDEMIA (ICD-272.4) CORONARY ARTERY DISEASE (ICD-414.00) CEREBROVASCULAR DISEASE (ICD-437.9) DIABETES MELLITUS, TYPE II (ICD-250.00) elevated PSA interstitial cystitis intolerant of statins Anemia  Hx. of blood loss PVD  Social History: Reviewed history from 02/15/2010 and no changes required. Married Former Smoker-stopped 2001 Regular exercise-no Alcohol Use - no Daily Caffeine Use-3-4 cups daily Illicit Drug Use - no  Review of Systems       no fevers or chills, productive cough, hemoptysis, dysphasia, odynophagia, melena, hematochezia, dysuria, hematuria, rash, seizure activity, orthopnea, PND, pedal edema, claudication. Remaining systems are negative.   Vital Signs:  Patient profile:   77 year old male Height:      70 inches Weight:      219 pounds BMI:     31.54 Pulse rate:   72 / minute BP sitting:   130 / 64  (left arm)  Vitals Entered By: Laurance Flatten CMA (May 02, 2010 4:06 PM)  Physical Exam  General:  Well-developed well-nourished in no acute distress.  Skin is warm and dry.  HEENT is normal.  Neck is supple. No thyromegaly.  Chest is clear to auscultation with normal expansion.  Cardiovascular exam is regular rate and rhythm.  Abdominal exam nontender or distended. No masses palpated. Extremities show no edema. neuro grossly intact    Impression & Recommendations:  Problem # 1:  PREOPERATIVE EXAMINATION (ICD-V72.84) No chest pain and Myoview low risk. Proceed with surgery without further workup.  Problem #  2:  HYPERTENSION (ICD-401.9) Blood pressure controlled on present medications. Will continue. His updated medication list for this problem includes:    Amlodipine Besylate 10 Mg Tabs (Amlodipine besylate) ..... One by mouth daily    Hydrochlorothiazide 12.5 Mg Caps (Hydrochlorothiazide) .Marland Kitchen... Take 1 capsule by mouth once a day    Lisinopril 20 Mg Tabs (Lisinopril) .Marland Kitchen...  Take 1 tablet by mouth at bedtime    Metoprolol Tartrate 50 Mg Tabs (Metoprolol tartrate) .Marland Kitchen... 1 tab po two times a day    Aspirin Ec 325 Mg Tbec (Aspirin) .Marland Kitchen... 1 tab po daily  His updated medication list for this problem includes:    Amlodipine Besylate 10 Mg Tabs (Amlodipine besylate) ..... One by mouth daily    Hydrochlorothiazide 12.5 Mg Caps (Hydrochlorothiazide) .Marland Kitchen... Take 1 capsule by mouth once a day    Lisinopril 20 Mg Tabs (Lisinopril) .Marland Kitchen... Take 1 tablet by mouth at bedtime    Metoprolol Tartrate 50 Mg Tabs (Metoprolol tartrate) .Marland Kitchen... 1 tab po two times a day    Aspirin Ec 325 Mg Tbec (Aspirin) .Marland Kitchen... 1 tab po daily  Problem # 3:  HYPERLIPIDEMIA (ICD-272.4) Continue diet. Intolerant to statins.  Problem # 4:  CORONARY ARTERY DISEASE (ICD-414.00)  Continue aspirin, beta blocker and ACE inhibitor. His updated medication list for this problem includes:    Amlodipine Besylate 10 Mg Tabs (Amlodipine besylate) ..... One by mouth daily    Lisinopril 20 Mg Tabs (Lisinopril) .Marland Kitchen... Take 1 tablet by mouth at bedtime    Metoprolol Tartrate 50 Mg Tabs (Metoprolol tartrate) .Marland Kitchen... 1 tab po two times a day    Aspirin Ec 325 Mg Tbec (Aspirin) .Marland Kitchen... 1 tab po daily  His updated medication list for this problem includes:    Amlodipine Besylate 10 Mg Tabs (Amlodipine besylate) ..... One by mouth daily    Lisinopril 20 Mg Tabs (Lisinopril) .Marland Kitchen... Take 1 tablet by mouth at bedtime    Metoprolol Tartrate 50 Mg Tabs (Metoprolol tartrate) .Marland Kitchen... 1 tab po two times a day    Aspirin Ec 325 Mg Tbec (Aspirin) .Marland Kitchen... 1 tab po daily  Problem # 5:  CEREBROVASCULAR DISEASE (ICD-437.9) Continue aspirin. Followup carotid Dopplers January 2012.  Problem # 6:  DIABETES MELLITUS, TYPE II (ICD-250.00)  His updated medication list for this problem includes:    Lisinopril 20 Mg Tabs (Lisinopril) .Marland Kitchen... Take 1 tablet by mouth at bedtime    Glyburide 5 Mg Tabs (Glyburide) .Marland Kitchen... Take 2 tablets in the morning and 1  tablet in the evening daily    Janumet 50-1000 Mg Tabs (Sitagliptin-metformin hcl) .Marland Kitchen... Take 1 tablet by mouth two times a day    Aspirin Ec 325 Mg Tbec (Aspirin) .Marland Kitchen... 1 tab po daily  His updated medication list for this problem includes:    Lisinopril 20 Mg Tabs (Lisinopril) .Marland Kitchen... Take 1 tablet by mouth at bedtime    Glyburide 5 Mg Tabs (Glyburide) .Marland Kitchen... Take 2 tablets in the morning and 1 tablet in the evening daily    Janumet 50-1000 Mg Tabs (Sitagliptin-metformin hcl) .Marland Kitchen... Take 1 tablet by mouth two times a day    Aspirin Ec 325 Mg Tbec (Aspirin) .Marland Kitchen... 1 tab po daily  Patient Instructions: 1)  Your physician recommends that you schedule a follow-up appointment in: 6 months WITH DR CRENSHAW 2)  Your physician recommends that you continue on your current medications as directed. Please refer to the Current Medication list given to you today.

## 2010-07-26 NOTE — Progress Notes (Signed)
Summary: Nuclear Pre-Procedure  Phone Note Outgoing Call   Call placed by: Milana Na, EMT-P,  April 24, 2010 2:56 PM Summary of Call: Reviewed information on Myoview Information Sheet (see scanned document for further details).  Spoke with patient.     Nuclear Med Background Indications for Stress Test: Evaluation for Ischemia, Surgical Clearance  Indications Comments: Pending Colon Surgery Franchot Erichsen  History: CABG, Heart Catheterization, Myocardial Perfusion Study  History Comments: '01 Heart Cath --CABG  '09 MPS MED [place] mild apical ischemia NL LVF  Symptoms: DOE    Nuclear Pre-Procedure Cardiac Risk Factors: Carotid Disease, History of Smoking, Hypertension, Lipids, NIDDM Height (in): 68.5  Nuclear Med Study Referring MD:  B.Crenshaw

## 2010-07-26 NOTE — Letter (Signed)
Summary: [facility] Clearance   Imported By: Roderic Ovens 05/10/2010 17:27:11  _____________________________________________________________________  External Attachment:    Type:   Image     Comment:   External Document

## 2010-07-26 NOTE — Assessment & Plan Note (Signed)
Summary: roa reschule from 06-16-07/jls   Vital Signs:  Patient Profile:   77 Years Old Male Weight:      219 pounds Temp:     98.5 degrees F Pulse rate:   60 / minute BP sitting:   142 / 74  (left arm)  Vitals Entered By: Gladis Riffle, RN (July 28, 2007 10:16 AM)                 Chief Complaint:  rov and states CBG average 150 at home.  History of Present Illness: Current Problems:  HIP PAIN, BILATERAL (ICD-719.45)-resolved with discontinuation of zetia.  HYPERTENSION (ICD-401.9)-tolerating meds without difficulty HYPERLIPIDEMIA (ICD-272.4)-he self dcd zetia because he thinks it caused hip pain. Stopping meds caused hip pain to resolve.  DIABETES MELLITUS, TYPE II (ICD-250.00)-home CBGs are 120-160. No polyuria, polydipsia. Says he was becoming hypoglycemic at night when he was taking glyburide 10mg  two times a day. he is now taking 10mg  am and 5 mg pm. he is feeling much better.  CORONARY ARTERY DISEASE (ICD-414.00)-no CP, SOB, PND  Current Meds:  AMLODIPINE BESYLATE 10 MG TABS (AMLODIPINE BESYLATE) one by mouth daily HYDROCHLOROTHIAZIDE 12.5 MG CAPS (HYDROCHLOROTHIAZIDE) Take 1 capsule by mouth once a day LISINOPRIL 20 MG TABS (LISINOPRIL) Take 1 tablet by mouth at bedtime METOPROLOL TARTRATE 50 MG TABS (METOPROLOL TARTRATE) bid MICRONASE 5 MG TABS (GLYBURIDE) 2 bid JANUMET 50-1000 MG  TABS (SITAGLIPTIN-METFORMIN HCL) Take 1 tablet by mouth two times a day ASPIRIN EC 325 MG  TBEC (ASPIRIN) daily  Social History: Married Former Smoker Regular exercise-no  Family History: father deceased- heart related at age 5 mother deceased 5 yo -no particular cause Past Medical History: Coronary artery disease Diabetes mellitus, type II Hyperlipidemia Hypertension elevated PSA interstitial cystitis intolerant of statins      Current Allergies: ! LIPITOR (ATORVASTATIN CALCIUM) ! PRAVACHOL (PRAVASTATIN SODIUM) ! CRESTOR (ROSUVASTATIN CALCIUM) ! ZETIA (EZETIMIBE)   Past Medical History:    Reviewed history from 01/21/2007 and no changes required:       Coronary artery disease       Diabetes mellitus, type II       Hyperlipidemia       Hypertension       elevated PSA       interstitial cystitis       intolerant of statins  Past Surgical History:    Reviewed history from 01/16/2007 and no changes required:       Coronary artery bypass graft  2001       kidney stones  1972   Family History:    Reviewed history from 02/16/2007 and no changes required:       father deceased- heart related at age 19       mother deceased 74 yo -no particular cause  Social History:    Reviewed history from 02/16/2007 and no changes required:       Married       Former Smoker       Regular exercise-no    Review of Systems       no other complaints in a complete ROS    Physical Exam  General:     Well-developed,well-nourished,in no acute distress; alert,appropriate and cooperative throughout examination Head:     normocephalic and atraumatic.   Eyes:     pupils equal and pupils round.   Ears:     R ear normal and L ear normal.   Nose:     no external  deformity and no external erythema.   Neck:     No deformities, masses, or tenderness noted. Lungs:     Normal respiratory effort, chest expands symmetrically. Lungs are clear to auscultation, no crackles or wheezes. Abdomen:     Bowel sounds positive,abdomen soft and non-tender without masses, organomegaly or hernias noted. Msk:     No deformity or scoliosis noted of thoracic or lumbar spine.   Pulses:     R radial normal and L radial normal.   Neurologic:     cranial nerves II-XII intact and gait normal.   Cervical Nodes:     no anterior cervical adenopathy and no posterior cervical adenopathy.   Psych:     normally interactive and good eye contact.      Impression & Recommendations:  Problem # 1:  HIP PAIN, BILATERAL (ICD-719.45) resolved off of zetia His updated medication list for  this problem includes:    Aspirin Ec 325 Mg Tbec (Aspirin) .Marland Kitchen... Daily   Problem # 2:  HYPERTENSION (ICD-401.9) fair control---continue to monitor home blood pressures have been less than 130/80. His updated medication list for this problem includes:    Amlodipine Besylate 10 Mg Tabs (Amlodipine besylate) ..... One by mouth daily    Hydrochlorothiazide 12.5 Mg Caps (Hydrochlorothiazide) .Marland Kitchen... Take 1 capsule by mouth once a day    Lisinopril 20 Mg Tabs (Lisinopril) .Marland Kitchen... Take 1 tablet by mouth at bedtime    Metoprolol Tartrate 50 Mg Tabs (Metoprolol tartrate) ..... Bid  BP today: 142/74 Prior BP: 148/80 (04/16/2007)  Labs Reviewed: Creat: 1.3 (02/09/2007) Chol: 154 (02/09/2007)   HDL: 30.0 (02/09/2007)   LDL: DEL (02/09/2007)   TG: 243 (02/09/2007)  Orders: Venipuncture (19147) TLB-BMP (Basic Metabolic Panel-BMET) (80048-METABOL)   Problem # 3:  DIABETES MELLITUS, TYPE II (ICD-250.00) continue current medications.check laboratories today. His updated medication list for this problem includes:    Lisinopril 20 Mg Tabs (Lisinopril) .Marland Kitchen... Take 1 tablet by mouth at bedtime    Micronase 5 Mg Tabs (Glyburide) .Marland Kitchen... 2 bid    Janumet 50-1000 Mg Tabs (Sitagliptin-metformin hcl) .Marland Kitchen... Take 1 tablet by mouth two times a day    Aspirin Ec 325 Mg Tbec (Aspirin) .Marland Kitchen... Daily  Labs Reviewed: HgBA1c: 7.0 (02/09/2007)   Creat: 1.3 (02/09/2007)     Orders: TLB-A1C / Hgb A1C (Glycohemoglobin) (83036-A1C)   Problem # 4:  CORONARY ARTERY DISEASE (ICD-414.00) aggressive risk factor modification would be ideal.  He has been unable to tolerate antihyperlipidemia medications.  Will check labs today. His updated medication list for this problem includes:    Amlodipine Besylate 10 Mg Tabs (Amlodipine besylate) ..... One by mouth daily    Hydrochlorothiazide 12.5 Mg Caps (Hydrochlorothiazide) .Marland Kitchen... Take 1 capsule by mouth once a day    Lisinopril 20 Mg Tabs (Lisinopril) .Marland Kitchen... Take 1 tablet by mouth at  bedtime    Metoprolol Tartrate 50 Mg Tabs (Metoprolol tartrate) ..... Bid    Aspirin Ec 325 Mg Tbec (Aspirin) .Marland Kitchen... Daily  Labs Reviewed: Chol: 154 (02/09/2007)   HDL: 30.0 (02/09/2007)   LDL: DEL (02/09/2007)   TG: 243 (02/09/2007)   Complete Medication List: 1)  Amlodipine Besylate 10 Mg Tabs (Amlodipine besylate) .... One by mouth daily 2)  Hydrochlorothiazide 12.5 Mg Caps (Hydrochlorothiazide) .... Take 1 capsule by mouth once a day 3)  Lisinopril 20 Mg Tabs (Lisinopril) .... Take 1 tablet by mouth at bedtime 4)  Metoprolol Tartrate 50 Mg Tabs (Metoprolol tartrate) .... Bid 5)  Micronase 5 Mg Tabs (  Glyburide) .... 2 bid 6)  Janumet 50-1000 Mg Tabs (Sitagliptin-metformin hcl) .... Take 1 tablet by mouth two times a day 7)  Aspirin Ec 325 Mg Tbec (Aspirin) .... Daily  Other Orders: TLB-Lipid Panel (80061-LIPID) TLB-Hepatic/Liver Function Pnl (80076-HEPATIC)   Patient Instructions: 1)  Please schedule a follow-up appointment in 6 months.    ]

## 2010-07-26 NOTE — Assessment & Plan Note (Signed)
Summary: 4 MONTH ROA/JLS reschedule with patient from bump/mhf rsc app...   Vital Signs:  Patient profile:   77 year old male Weight:      213 pounds Temp:     97.5 degrees F Pulse rate:   56 / minute Resp:     12 per minute BP sitting:   128 / 70  (left arm)  Vitals Entered By: Gladis Riffle, RN (September 28, 2008 11:33 AM)  History of Present Illness:  Follow-Up Visit      This is a 77 year old man who presents for Follow-up visit.  The patient denies chest pain, palpitations, dizziness, syncope, low blood sugar symptoms, high blood sugar symptoms, edema, SOB, DOE, PND, and orthopnea.  Since the last visit the patient notes no new problems or concerns.  The patient reports taking meds as prescribed.  When questioned about possible medication side effects, the patient notes none.    Current Problems (verified): 1)  Special Screening For Malignant Neoplasms Colon  (ICD-V76.51) 2)  Claudication  (ICD-443.9) 3)  Interstitial Cystitis  (ICD-595.1) 4)  Psa, Increased  (ICD-790.93) 5)  Hypertension  (ICD-401.9) 6)  Hyperlipidemia  (ICD-272.4) 7)  Diabetes Mellitus, Type II  (ICD-250.00) 8)  Coronary Artery Disease  (ICD-414.00)  Medications Prior to Update: 1)  Amlodipine Besylate 10 Mg Tabs (Amlodipine Besylate) .... One By Mouth Daily 2)  Hydrochlorothiazide 12.5 Mg Caps (Hydrochlorothiazide) .... Take 1 Capsule By Mouth Once A Day 3)  Lisinopril 20 Mg Tabs (Lisinopril) .... Take 1 Tablet By Mouth At Bedtime 4)  Metoprolol Tartrate 50 Mg Tabs (Metoprolol Tartrate) .... Bid 5)  Glyburide 5 Mg Tabs (Glyburide) .... Take 2 Tablets in The Morning and 1 Tablet in The Evening Daily 6)  Janumet 50-1000 Mg  Tabs (Sitagliptin-Metformin Hcl) .... Take 1 Tablet By Mouth Two Times A Day 7)  Aspirin Ec 325 Mg  Tbec (Aspirin) .... Daily 8)  Omega-3 Fish Oil 1000 Mg Caps (Omega-3 Fatty Acids) .... Two Times A Day  Current Medications (verified): 1)  Amlodipine Besylate 10 Mg Tabs (Amlodipine  Besylate) .... One By Mouth Daily 2)  Hydrochlorothiazide 12.5 Mg Caps (Hydrochlorothiazide) .... Take 1 Capsule By Mouth Once A Day 3)  Lisinopril 20 Mg Tabs (Lisinopril) .... Take 1 Tablet By Mouth At Bedtime 4)  Metoprolol Tartrate 50 Mg Tabs (Metoprolol Tartrate) .... Bid 5)  Glyburide 5 Mg Tabs (Glyburide) .... Take 2 Tablets in The Morning and 1 Tablet in The Evening Daily 6)  Janumet 50-1000 Mg  Tabs (Sitagliptin-Metformin Hcl) .... Take 1 Tablet By Mouth Two Times A Day 7)  Aspirin Ec 325 Mg  Tbec (Aspirin) .... Daily 8)  Omega-3 Fish Oil 1000 Mg Caps (Omega-3 Fatty Acids) .... Two Times A Day  Allergies (verified): 1)  ! Lipitor (Atorvastatin Calcium) 2)  ! Pravachol (Pravastatin Sodium) 3)  ! Crestor (Rosuvastatin Calcium) 4)  ! Zetia (Ezetimibe)  Comments:  Nurse/Medical Assistant: 4 month rov, CBGs 150 usually at home The patient's medications and allergies were reviewed with the patient and were updated in the Medication and Allergy Lists. Gladis Riffle, RN (September 28, 2008 11:34 AM)  Past History:  Past Medical History:    Coronary artery disease    Diabetes mellitus, type II    Hyperlipidemia    Hypertension    elevated PSA    interstitial cystitis    intolerant of statins (01/21/2007)  Past Surgical History:    Coronary artery bypass graft  2001  kidney stones  1972    Carpal tunnel release  06/09     (01/25/2008)  Family History:    father deceased- heart related at age 21    mother deceased 75 yo -no particular cause (02/16/2007)  Social History:    Married    Former Smoker    Regular exercise-no     (02/16/2007)  Risk Factors:    Alcohol Use: N/A    >5 drinks/d w/in last 3 months: N/A    Caffeine Use: N/A    Diet: N/A    Exercise: no (02/16/2007)  Risk Factors:    Smoking Status: quit (02/16/2007)    Packs/Day: N/A    Cigars/wk: N/A    Pipe Use/wk: N/A    Cans of tobacco/wk: N/A    Passive Smoke Exposure: N/A  Physical Exam  General:   Well-developed,well-nourished,in no acute distress; alert,appropriate and cooperative throughout examination Head:  normocephalic and atraumatic.   Eyes:  pupils equal and pupils round.   Ears:  R ear normal and L ear normal.   Neck:  No deformities, masses, or tenderness noted. Chest Wall:  no deformities, no tenderness, and no masses.   Lungs:  Normal respiratory effort, chest expands symmetrically. Lungs are clear to auscultation, no crackles or wheezes. Abdomen:  Bowel sounds positive,abdomen soft and non-tender without masses, organomegaly or hernias noted.  overweight Msk:  No deformity or scoliosis noted of thoracic or lumbar spine.   Extremities:  No clubbing, cyanosis, edema, or deformity noted  Neurologic:  cranial nerves II-XII intact and gait normal.   Skin:  turgor normal and color normal.   Cervical Nodes:  no anterior cervical adenopathy and no posterior cervical adenopathy.   Psych:  Oriented X3 and memory intact for recent and remote.    Diabetes Management Exam:    Eye Exam:       Eye Exam done elsewhere          Date: 06/24/2008          Results: normal-pt's report          Done by: ophthalma   Impression & Recommendations:  Problem # 1:  HYPERTENSION (ICD-401.9)  His updated medication list for this problem includes:    Amlodipine Besylate 10 Mg Tabs (Amlodipine besylate) ..... One by mouth daily    Hydrochlorothiazide 12.5 Mg Caps (Hydrochlorothiazide) .Marland Kitchen... Take 1 capsule by mouth once a day    Lisinopril 20 Mg Tabs (Lisinopril) .Marland Kitchen... Take 1 tablet by mouth at bedtime    Metoprolol Tartrate 50 Mg Tabs (Metoprolol tartrate) ..... Bid  BP today: 128/70 Prior BP: 130/74 (05/26/2008)  Labs Reviewed: K+: 4.8 (05/26/2008) Creat: : 1.2 (05/26/2008)   Chol: 157 (05/26/2008)   HDL: 30.1 (05/26/2008)   LDL: 92 (05/26/2008)   TG: 177 (05/26/2008)  Orders: Venipuncture (16109) TLB-BMP (Basic Metabolic Panel-BMET) (80048-METABOL)  Problem # 2:  HYPERLIPIDEMIA  (ICD-272.4)  Labs Reviewed: SGOT: 21 (05/26/2008)   SGPT: 19 (05/26/2008)   HDL:30.1 (05/26/2008), 31.9 (01/25/2008)  LDL:92 (05/26/2008), DEL (60/45/4098)  Chol:157 (05/26/2008), 169 (01/25/2008)  Trig:177 (05/26/2008), 215 (01/25/2008)  Orders: TLB-Lipid Panel (80061-LIPID) TLB-Hepatic/Liver Function Pnl (80076-HEPATIC)  Problem # 3:  DIABETES MELLITUS, TYPE II (ICD-250.00)  His updated medication list for this problem includes:    Lisinopril 20 Mg Tabs (Lisinopril) .Marland Kitchen... Take 1 tablet by mouth at bedtime    Glyburide 5 Mg Tabs (Glyburide) .Marland Kitchen... Take 2 tablets in the morning and 1 tablet in the evening daily    Janumet 50-1000 Mg  Tabs (Sitagliptin-metformin hcl) .Marland Kitchen... Take 1 tablet by mouth two times a day    Aspirin Ec 325 Mg Tbec (Aspirin) .Marland Kitchen... Daily  Labs Reviewed: Creat: 1.2 (05/26/2008)    Reviewed HgBA1c results: 6.7 (05/26/2008)  7.0 (01/25/2008)  Orders: TLB-A1C / Hgb A1C (Glycohemoglobin) (83036-A1C)  Problem # 4:  CORONARY ARTERY DISEASE (ICD-414.00)  His updated medication list for this problem includes:    Amlodipine Besylate 10 Mg Tabs (Amlodipine besylate) ..... One by mouth daily    Hydrochlorothiazide 12.5 Mg Caps (Hydrochlorothiazide) .Marland Kitchen... Take 1 capsule by mouth once a day    Lisinopril 20 Mg Tabs (Lisinopril) .Marland Kitchen... Take 1 tablet by mouth at bedtime    Metoprolol Tartrate 50 Mg Tabs (Metoprolol tartrate) ..... Bid    Aspirin Ec 325 Mg Tbec (Aspirin) .Marland Kitchen... Daily  Labs Reviewed: Chol: 157 (05/26/2008)   HDL: 30.1 (05/26/2008)   LDL: 92 (05/26/2008)   TG: 177 (05/26/2008)  Complete Medication List: 1)  Amlodipine Besylate 10 Mg Tabs (Amlodipine besylate) .... One by mouth daily 2)  Hydrochlorothiazide 12.5 Mg Caps (Hydrochlorothiazide) .... Take 1 capsule by mouth once a day 3)  Lisinopril 20 Mg Tabs (Lisinopril) .... Take 1 tablet by mouth at bedtime 4)  Metoprolol Tartrate 50 Mg Tabs (Metoprolol tartrate) .... Bid 5)  Glyburide 5 Mg Tabs (Glyburide)  .... Take 2 tablets in the morning and 1 tablet in the evening daily 6)  Janumet 50-1000 Mg Tabs (Sitagliptin-metformin hcl) .... Take 1 tablet by mouth two times a day 7)  Aspirin Ec 325 Mg Tbec (Aspirin) .... Daily 8)  Omega-3 Fish Oil 1000 Mg Caps (Omega-3 fatty acids) .... Two times a day   Patient Instructions: 1)  Please schedule a follow-up appointment in 4 months. 2)  Schedule a colonoscopy/sigmoidoscopy to help detect colon cancer. 3)  Check your blood sugars regularly. If your readings are usually above : or below 70 you should contact our office. 4)  It is important that your Diabetic A1c level is checked every 3 months. 5)  See your eye doctor yearly to check for diabetic eye damage. 6)  Check your feet each night for sore areas, calluses or signs of infection.

## 2010-07-26 NOTE — Letter (Signed)
Summary: [facility]   Imported By: Maryln Gottron 06/20/2010 09:51:13  _____________________________________________________________________  External Attachment:    Type:   Image     Comment:   External Document

## 2010-07-26 NOTE — Miscellaneous (Signed)
Summary: Orders Update  Clinical Lists Changes  Orders: Added new Test order of Carotid Duplex (Carotid Duplex) - Signed 

## 2010-07-26 NOTE — Assessment & Plan Note (Signed)
Summary: Cardiology Nuclear Testing  Nuclear Med Background Indications for Stress Test: Evaluation for Ischemia, Surgical Clearance  Indications Comments: Pending Colon Surgery Clayton Harrison  History: CABG, Heart Catheterization, Myocardial Perfusion Study  History Comments: '01 Heart Cath --CABG  '09 MPS MED TX mild apical ischemia NL LVF  Symptoms: DOE, Palpitations    Nuclear Pre-Procedure Cardiac Risk Factors: Carotid Disease, History of Smoking, Hypertension, Lipids, NIDDM Caffeine/Decaff Intake: None NPO After: 5:00 PM Lungs: clear IV 0.9% NS with Angio Cath: 22g     IV Site: R Antecubital IV Started by: Bonnita Levan, RN Chest Size (in) 46     Height (in): 70 Weight (lb): 215 BMI: 30.96  Nuclear Med Study 1 or 2 day study:  1 day     Stress Test Type:  Treadmill/Lexiscan Reading MD:  Marca Ancona, MD     Referring MD:  B.Crenshaw Resting Radionuclide:  Technetium 17m Tetrofosmin     Resting Radionuclide Dose:  11 mCi  Stress Radionuclide:  Technetium 10m Tetrofosmin     Stress Radionuclide Dose:  33 mCi   Stress Protocol  Max Systolic BP: 147 mm Hg Lexiscan: 0.4 mg   Stress Test Technologist:  Milana Na, EMT-P     Nuclear Technologist:  Doyne Keel, CNMT  Rest Procedure  Myocardial perfusion imaging was performed at rest 45 minutes following the intravenous administration of Technetium 51m Tetrofosmin.  Stress Procedure  The patient received IV Lexiscan 0.4 mg over 15-seconds with concurrent low level exercise and then Technetium 53m Tetrofosmin was injected at 30-seconds while the patient continued walking one more minute.  There were no significant changes and occ pacs with Lexiscan.  Quantitative spect images were obtained after a 45 minute delay.  QPS Raw Data Images:  Normal; no motion artifact; normal heart/lung ratio. Stress Images:  Small apical perfusion defect.  Basal anteroseptal perfusion defect.  Rest Images:  Small apical perfusion  defect.  Basal anteroseptal perfusion defect.  Subtraction (SDS):  Fixed small apical perfusion defect and basal anteroseptal perfusion defect.  Transient Ischemic Dilatation:  .98  (Normal <1.22)  Lung/Heart Ratio:  .41  (Normal <0.45)  Quantitative Gated Spect Images QGS EDV:  93 ml QGS ESV:  36 ml QGS EF:  61 % QGS cine images:  Normal wall motion.    Overall Impression  Exercise Capacity: Lexiscan with no exercise. BP Response: Normal blood pressure response. Clinical Symptoms: Short of breath.  ECG Impression: Baseline ST depression in inferior and anterior leads, no change with infusion.  Overall Impression: Fixed apical perfusion defect represent prior MI versus attenuation.  Fixed basal anteroseptal perfusion defect is likely an artifact due to "short septum". Overall Impression Comments: Low risk study.   Appended Document: Cardiology Nuclear Testing ok for surgery  Appended Document: Cardiology Nuclear Testing pt aware of results, has a follow up 05-02-10

## 2010-07-26 NOTE — Miscellaneous (Signed)
Summary: Orders Update  Clinical Lists Changes  Orders: Added new Test order of Arterial Duplex Lower Extremity (Arterial Duplex Low) - Signed 

## 2010-07-26 NOTE — Discharge Summary (Signed)
Summary: Discharge Summary  DG Abdomen Acute W/Chest - STATUS: Final  IMAGE                                     Perform Date: 24Dec08 22:28  Ordered By: Rozell Searing Date: 24Dec08 21:04  Facility: [facility]                              Department: DG  Service Report Text  [facility] Accession Number: [id number]     Clinical Data: Vomiting. Abdominal pain. Nausea and wheezing.   ACUTE ABDOMEN SERIES:   Comparison: 12/29/06.   Findings: The patient is status post CABG.  There is cardiomegaly.   Scarring is seen in the left mid lung. Mild interstitial edema is   noted. No focal airspace disease.   Two views of the abdomen demonstrate no free intraperitoneal air.   Large volume of gas and stool present through the colon. A single   loop of gas-filled small bowel in the left lower quadrant.   Calcification projecting over the left SI joint is present on prior   CT scan from 2006.   IMPRESSION:   1.  Cardiomegaly and interstitial edema.   2.  Negative for free air with nonspecific bowel gas pattern.    Read By:  Charyl Dancer,  M.D.   Released By:  Charyl Dancer,  M.D.  Additional Information  HL7 RESULT STATUS : F  External image : [id number]  External IF Update Timestamp : 2007-06-20:18:01:04.000000

## 2010-07-26 NOTE — Assessment & Plan Note (Signed)
Summary: hand goes numb/mhf   Vital Signs:  Patient Profile:   77 Years Old Male Weight:      214 pounds Temp:     98.3 degrees F Pulse rate:   70 / minute BP sitting:   154 / 78  (left arm)  Vitals Entered By: Gladis Riffle, RN (October 21, 2007 10:30 AM)                 Chief Complaint:  c/o left hand numbness x 1 month--describes as burning pain in center of palm and awakens him at night.  History of Present Illness: Intermittent hand numbness. Typically worse at night. "deep, burning" sensation. Duration of sxs months, but progressive. He is unable to isolate the discomfort more than just to his Left hand.     Current Allergies (reviewed today): ! LIPITOR (ATORVASTATIN CALCIUM) ! PRAVACHOL (PRAVASTATIN SODIUM) ! CRESTOR (ROSUVASTATIN CALCIUM) ! ZETIA (EZETIMIBE)     Review of Systems       no other complaints in a complete ROS    Physical Exam  General:     Well-developed,well-nourished,in no acute distress; alert,appropriate and cooperative throughout examination Head:     normocephalic and atraumatic.   Eyes:     pupils equal and pupils round.   Ears:     R ear normal and L ear normal.   Msk:     No deformity or scoliosis noted of thoracic or lumbar spine.   Neurologic:     negative tinnel's , negative phalen's     Impression & Recommendations:  Problem # 1:  CARPAL TUNNEL SYNDROME (ICD-354.0) likely cause of sxs wrist brace at night. call 10 days if sxs persist---may need ncs  Complete Medication List: 1)  Amlodipine Besylate 10 Mg Tabs (Amlodipine besylate) .... One by mouth daily 2)  Hydrochlorothiazide 12.5 Mg Caps (Hydrochlorothiazide) .... Take 1 capsule by mouth once a day 3)  Lisinopril 20 Mg Tabs (Lisinopril) .... Take 1 tablet by mouth at bedtime 4)  Metoprolol Tartrate 50 Mg Tabs (Metoprolol tartrate) .... Bid 5)  Micronase 5 Mg Tabs (Glyburide) .... 2 i am and one in pm 6)  Janumet 50-1000 Mg Tabs (Sitagliptin-metformin hcl) .... Take  1 tablet by mouth two times a day 7)  Aspirin Ec 325 Mg Tbec (Aspirin) .... Daily     ]

## 2010-07-26 NOTE — Letter (Signed)
Summary: Patient Notice- Polyp Results  [facility]  [address] [place], [place] [address]   Phone: [phone]  Fax: [phone]        March 28, 2010 MRN: [id number]    BRAXDEN LOVERING [address] [place], [place]  [address]    Dear Mr. Fergeson,  I am pleased to inform you that smaller the colon polyp(s) removed during your recent colonoscopy was (were) found to be benign (no cancer detected) upon pathologic examination.   Also, sample biopsies from the larger lesion, in the right colon, did not show cancer. This lesion is precancerous (at least)and needs to be removed surgically (as we discussed)  I recommend you have a repeat colonoscopy examination in 1 years to look for recurrent polyps, as having colon polyps increases your risk for having recurrent polyps or even colon cancer in the future.    Additional information/recommendations:  __ No further action with gastroenterology is needed at this time. Please      follow-up with Dr. Dwain Sarna of General Surgery as scheduled.      Please call us if you are having persistent problems or have questions about your condition that have not been fully answered at this time.  Sincerely,  Hilarie Fredrickson MD  This letter has been electronically signed by your physician.  Appended Document: Patient Notice- Polyp Results letter mailed

## 2010-07-26 NOTE — Progress Notes (Signed)
----   Converted from flag ---- ---- 03/12/2007 9:45 PM, Clayton Harrison wrote: 09/23 @ 4:00/left message 09/19 @ 10:10  ---- 03/12/2007 9:45 PM, Birdie Sons MD wrote: no, exercise ABI (ankle brachial index) . To look for peripheral vascular disease  ---- 03/12/2007 10:39 AM, Clayton Harrison wrote: Myoview stress test?  ---- 03/10/2007 6:12 PM, Lynann Beaver CMA wrote: Please schedule Exercise study through Cardiology Dept. and call pt.  Thanks. ------------------------------  Appended Document:  Appt rescheduled 10/03 @ 11:30/patient aware

## 2010-07-26 NOTE — Assessment & Plan Note (Signed)
Summary: 4 MONTH ROAJLS   Vital Signs:  Patient Profile:   77 Years Old Male Weight:      220 pounds Temp:     98.2 degrees F oral Pulse rate:   86 / minute Pulse rhythm:   regular Resp:     20 per minute BP sitting:   122 / 66  Vitals Entered By: Lynann Beaver CMA (February 16, 2007 9:58 AM)               Chief Complaint:  rov.  History of Present Illness:  Follow-Up Visit: CAD, htn, dm, lipids      This is a 77 year old man who presents for Follow-up visit.  The patient denies chest pain, palpitations, dizziness, syncope, low blood sugar symptoms, high blood sugar symptoms, edema, SOB, DOE, PND, and orthopnea.  Since the last visit the patient notes no new problems or concerns.  The patient reports taking meds as prescribed, not monitoring BP, and not monitoring blood sugars.  When questioned about possible medication side effects, the patient notes none.     he complains of bilateral leg pain with exertion. Pain resolves with rest---duration " a log time"--not really progressive  Current Allergies: ! LIPITOR (ATORVASTATIN CALCIUM) ! PRAVACHOL (PRAVASTATIN SODIUM) ! CRESTOR (ROSUVASTATIN CALCIUM)  Past Medical History:    Reviewed history from 01/21/2007 and no changes required:       Coronary artery disease       Diabetes mellitus, type II       Hyperlipidemia       Hypertension       elevated PSA       interstitial cystitis       intolerant of statins  Past Surgical History:    Reviewed history from 01/16/2007 and no changes required:       Coronary artery bypass graft  2001       kidney stones  1972   Family History:    father deceased- heart related at age 77    mother deceased 17 yo -no particular cause  Social History:    Married    Former Smoker    Regular exercise-no   Risk Factors:  Tobacco use:  quit    Year quit:  2001 Exercise:  no   Review of Systems  The patient denies anorexia, fever, weight loss, vision loss, decreased hearing,  hoarseness, chest pain, syncope, dyspnea on exhertion, peripheral edema, prolonged cough, hemoptysis, abdominal pain, melena, hematochezia, severe indigestion/heartburn, hematuria, incontinence, muscle weakness, suspicious skin lesions, transient blindness, difficulty walking, depression, unusual weight change, abnormal bleeding, enlarged lymph nodes, angioedema, and testicular masses.     Physical Exam  General:     Well-developed,well-nourished,in no acute distress; alert,appropriate and cooperative throughout examination Head:     Normocephalic and atraumatic without obvious abnormalities. No apparent alopecia or balding. Eyes:     vision grossly intact, pupils equal, and pupils round.   Ears:     R ear normal and L ear normal.   Nose:     no external deformity and no external erythema.   Mouth:     pharynx pink and moist.   Neck:     No deformities, masses, or tenderness noted. Chest Wall:     no deformities, no tenderness, and no masses.   Lungs:     normal respiratory effort, no intercostal retractions, no accessory muscle use, normal breath sounds, no dullness, no fremitus, no crackles, and no wheezes.   Abdomen:  soft, non-tender, normal bowel sounds, no distention, no masses, no guarding, and no rigidity.   Pulses:     R radial normal, R femoral normal, R popliteal normal, L radial normal, L femoral normal, and L popliteal normal.   Extremities:     trace left pedal edema and trace right pedal edema.   Neurologic:     No cranial nerve deficits noted. Station and gait are normal. Sensory, motor and coordinative functions appear intact.    Impression & Recommendations:  Problem # 1:  CORONARY ARTERY DISEASE (ICD-414.00)  His updated medication list for this problem includes:    Amlodipine Besylate 10 Mg Tabs (Amlodipine besylate) ..... One by mouth daily    Hydrochlorothiazide 12.5 Mg Caps (Hydrochlorothiazide) .Marland Kitchen... Take 1 capsule by mouth once a day    Lisinopril  20 Mg Tabs (Lisinopril) .Marland Kitchen... Take 1 tablet by mouth at bedtime    Metoprolol Tartrate 50 Mg Tabs (Metoprolol tartrate) ..... Bid    Aspirin Ec 325 Mg Tbec (Aspirin) .Marland Kitchen... Daily  Labs Reviewed: Chol: 154 (02/09/2007)   HDL: 30.0 (02/09/2007)   LDL: DEL (02/09/2007)   TG: 243 (02/09/2007)   Problem # 2:  HYPERLIPIDEMIA (ICD-272.4)  His updated medication list for this problem includes:    Zetia 10 Mg Tabs (Ezetimibe) .Marland Kitchen... Take 1 tablet by mouth at bedtime  Labs Reviewed: Chol: 154 (02/09/2007)   HDL: 30.0 (02/09/2007)   LDL: DEL (02/09/2007)   TG: 243 (02/09/2007) SGOT: 20 (02/09/2007)   SGPT: 22 (02/09/2007)   Problem # 3:  DIABETES MELLITUS, TYPE II (ICD-250.00)  The following medications were removed from the medication list:    Actos 15 Mg Tabs (Pioglitazone hcl)  His updated medication list for this problem includes:    Lisinopril 20 Mg Tabs (Lisinopril) .Marland Kitchen... Take 1 tablet by mouth at bedtime    Micronase 5 Mg Tabs (Glyburide) .Marland Kitchen... 2 bid    Janumet 50-1000 Mg Tabs (Sitagliptin-metformin hcl) .Marland Kitchen... Take 1 tablet by mouth two times a day    Aspirin Ec 325 Mg Tbec (Aspirin) .Marland Kitchen... Daily  Labs Reviewed: HgBA1c: 7.0 (02/09/2007)   Creat: 1.3 (02/09/2007)      Problem # 4:  HYPERTENSION (ICD-401.9)  His updated medication list for this problem includes:    Amlodipine Besylate 10 Mg Tabs (Amlodipine besylate) ..... One by mouth daily    Hydrochlorothiazide 12.5 Mg Caps (Hydrochlorothiazide) .Marland Kitchen... Take 1 capsule by mouth once a day    Lisinopril 20 Mg Tabs (Lisinopril) .Marland Kitchen... Take 1 tablet by mouth at bedtime    Metoprolol Tartrate 50 Mg Tabs (Metoprolol tartrate) ..... Bid  BP today: 122/66  Labs Reviewed: Creat: 1.3 (02/09/2007) Chol: 154 (02/09/2007)   HDL: 30.0 (02/09/2007)   LDL: DEL (02/09/2007)   TG: 243 (02/09/2007)   Problem # 5:  CLAUDICATION (ICD-443.9) needs further evaluation---refer for ABI and arterial doppler study Orders: Cardiology Referral  (Cardiology)   Complete Medication List: 1)  Amlodipine Besylate 10 Mg Tabs (Amlodipine besylate) .... One by mouth daily 2)  Hydrochlorothiazide 12.5 Mg Caps (Hydrochlorothiazide) .... Take 1 capsule by mouth once a day 3)  Lisinopril 20 Mg Tabs (Lisinopril) .... Take 1 tablet by mouth at bedtime 4)  Metoprolol Tartrate 50 Mg Tabs (Metoprolol tartrate) .... Bid 5)  Micronase 5 Mg Tabs (Glyburide) .... 2 bid 6)  Janumet 50-1000 Mg Tabs (Sitagliptin-metformin hcl) .... Take 1 tablet by mouth two times a day 7)  Zetia 10 Mg Tabs (Ezetimibe) .... Take 1 tablet by mouth at bedtime  8)  Aspirin Ec 325 Mg Tbec (Aspirin) .... Daily   Patient Instructions: 1)  Please schedule a follow-up appointment in 4 months. 2)  BMP prior to visit, ICD-9: 3)  Hepatic Panel prior to visit, ICD-9: 4)  Lipid Panel prior to visit, ICD-9: 5)  TSH prior to visit, ICD-9: 6)  HbgA1C prior to visit, ICD-9: 7)  Urine Microalbumin prior to visit, ICD-9:      DPT Vaccine # 1 (to be given today)   Pneumococcal Vaccine # 1 (to be given today)

## 2010-07-26 NOTE — Miscellaneous (Signed)
Summary: Diabetic Supplies/[facility]  Diabetic Supplies/[facility]   Imported By: Esmeralda Links D'jimraou 10/16/2007 15:33:42  _____________________________________________________________________  External Attachment:    Type:   Image     Comment:   External Document

## 2010-07-26 NOTE — Assessment & Plan Note (Signed)
Summary: discuss stress/dm   Vital Signs:  Patient Profile:   77 Years Old Male Weight:      218 pounds Temp:     97.6 degrees F oral Pulse rate:   66 / minute BP sitting:   148 / 80  (left arm)  Vitals Entered By: Gladis Riffle, RN (April 16, 2007 8:25 AM)                 Chief Complaint:  ROV--BP 130/60 at drugstores and CBG average 140 at home. declines flu shot.  Current Allergies (reviewed today): ! LIPITOR (ATORVASTATIN CALCIUM) ! PRAVACHOL (PRAVASTATIN SODIUM) ! CRESTOR (ROSUVASTATIN CALCIUM)  Past Surgical History:    Reviewed history from 01/16/2007 and no changes required:       Coronary artery bypass graft  2001       kidney stones  1972   Family History:    Reviewed history from 02/16/2007 and no changes required:       father deceased- heart related at age 34       mother deceased 91 yo -no particular cause  Social History:    Reviewed history from 02/16/2007 and no changes required:       Married       Former Smoker       Regular exercise-no       Impression & Recommendations:  Problem # 1:  HIP PAIN, BILATERAL (ICD-719.45) xray pv consult discussed for 15 minutes.  needs CV referral for claudication His updated medication list for this problem includes:    Aspirin Ec 325 Mg Tbec (Aspirin) .Marland Kitchen... Daily  Orders: T-Pelvis 1or 2 views ([id number])   Problem # 2:  CLAUDICATION (ICD-443.9)  Orders: Cardiology Referral (Cardiology)   Complete Medication List: 1)  Amlodipine Besylate 10 Mg Tabs (Amlodipine besylate) .... One by mouth daily 2)  Hydrochlorothiazide 12.5 Mg Caps (Hydrochlorothiazide) .... Take 1 capsule by mouth once a day 3)  Lisinopril 20 Mg Tabs (Lisinopril) .... Take 1 tablet by mouth at bedtime 4)  Metoprolol Tartrate 50 Mg Tabs (Metoprolol tartrate) .... Bid 5)  Micronase 5 Mg Tabs (Glyburide) .... 2 bid 6)  Janumet 50-1000 Mg Tabs (Sitagliptin-metformin hcl) .... Take 1 tablet by mouth two times a day 7)  Zetia 10 Mg  Tabs (Ezetimibe) .... Take 1 tablet by mouth at bedtime 8)  Aspirin Ec 325 Mg Tbec (Aspirin) .... Daily     ]refuses flu shot.

## 2010-07-26 NOTE — Miscellaneous (Signed)
Summary: Initial Summary for PT Services/[facility]  Initial Summary for PT Services/ Rehab   Imported By: Maryln Gottron 01/25/2010 10:25:46  _____________________________________________________________________  External Attachment:    Type:   Image     Comment:   External Document

## 2010-07-26 NOTE — Letter (Signed)
Summary: [facility] Instructions  [facility]  [address] [place], [place] [address]   Phone: [phone]  Fax: [phone]       Clayton Harrison    75-26-1932    MRN: [id number]        Procedure Day Dorna Bloom: Duanne Limerick, 03/26/10     Arrival Time:1:00 PM     Procedure Time:2:00 PM     Location of Procedure:                    X  [facility] (4th Floor)   PREPARATION FOR COLONOSCOPY WITH MOVIPREP/ENDO   Starting 5 days prior to your procedure 9/28/11do not eat nuts, seeds, popcorn, corn, beans, peas,  salads, or any raw vegetables.  Do not take any fiber supplements (e.g. Metamucil, Citrucel, and Benefiber).  THE DAY BEFORE YOUR PROCEDURE         DATE: 03/25/10 DAY: SUNDAY  1.  Drink clear liquids the entire day-NO SOLID FOOD  2.  Do not drink anything colored red or purple.  Avoid juices with pulp.  No orange juice.  3.  Drink at least 64 oz. (8 glasses) of fluid/clear liquids during the day to prevent dehydration and help the prep work efficiently.  CLEAR LIQUIDS INCLUDE: Water Jello Ice Popsicles Tea (sugar ok, no milk/cream) Powdered fruit flavored drinks Coffee (sugar ok, no milk/cream) Gatorade Juice: apple, white grape, white cranberry  Lemonade Clear bullion, consomm, broth Carbonated beverages (any kind) Strained chicken noodle soup Hard Candy                             4.  In the morning, mix first dose of MoviPrep solution:    Empty 1 Pouch A and 1 Pouch B into the disposable container    Add lukewarm drinking water to the top line of the container. Mix to dissolve    Refrigerate (mixed solution should be used within 24 hrs)  5.  Begin drinking the prep at 5:00 p.m. The MoviPrep container is divided by 4 marks.   Every 15 minutes drink the solution down to the next mark (approximately 8 oz) until the full liter is complete.   6.  Follow completed prep with 16 oz of clear liquid of your choice (Nothing red or purple).  Continue  to drink clear liquids until bedtime.  7.  Before going to bed, mix second dose of MoviPrep solution:    Empty 1 Pouch A and 1 Pouch B into the disposable container    Add lukewarm drinking water to the top line of the container. Mix to dissolve    Refrigerate  THE DAY OF YOUR PROCEDURE      DATE: 03/26/10 ONG:EXBMWU  Beginning at 9:00 a.m. (5 hours before procedure):         1. Every 15 minutes, drink the solution down to the next mark (approx 8 oz) until the full liter is complete.  2. Follow completed prep with 16 oz. of clear liquid of your choice.    3. You may drink clear liquids until 12 NOON(2 HOURS BEFORE PROCEDURE).   MEDICATION INSTRUCTIONS  Unless otherwise instructed, you should take regular prescription medications with a small sip of water   as early as possible the morning of your procedure.  Diabetic patients - see separate instructions.         OTHER INSTRUCTIONS  You will need a responsible adult at least  75 years of age to accompany you and drive you home.   This person must remain in the waiting room during your procedure.  Wear loose fitting clothing that is easily removed.  Leave jewelry and other valuables at home.  However, you may wish to bring a book to read or  an iPod/MP3 player to listen to music as you wait for your procedure to start.  Remove all body piercing jewelry and leave at home.  Total time from sign-in until discharge is approximately 2-3 hours.  You should go home directly after your procedure and rest.  You can resume normal activities the  day after your procedure.  The day of your procedure you should not:   Drive   Make legal decisions   Operate machinery   Drink alcohol   Return to work  You will receive specific instructions about eating, activities and medications before you leave.    The above instructions have been reviewed and explained to me by   _______________________    I fully understand and  can verbalize these instructions _____________________________ Date _________

## 2010-07-27 ENCOUNTER — Other Ambulatory Visit: Payer: Self-pay | Admitting: Internal Medicine

## 2010-07-27 DIAGNOSIS — E119 Type 2 diabetes mellitus without complications: Secondary | ICD-10-CM

## 2010-08-03 ENCOUNTER — Encounter: Payer: Self-pay | Admitting: Internal Medicine

## 2010-08-08 ENCOUNTER — Other Ambulatory Visit: Payer: Self-pay | Admitting: Internal Medicine

## 2010-08-21 NOTE — Letter (Signed)
Summary: [facility]   Imported By: Lennie Odor 08/14/2010 14:07:24  _____________________________________________________________________  External Attachment:    Type:   Image     Comment:   External Document

## 2010-08-24 ENCOUNTER — Encounter (HOSPITAL_COMMUNITY)
Admission: RE | Admit: 2010-08-24 | Discharge: 2010-08-24 | Disposition: A | Discharge status: Home / Self Care | Payer: Medicare Other | Source: Ambulatory Visit | Attending: General Surgery | Admitting: General Surgery

## 2010-08-24 DIAGNOSIS — Z01812 Encounter for preprocedural laboratory examination: Secondary | ICD-10-CM | POA: Insufficient documentation

## 2010-08-24 DIAGNOSIS — Z01818 Encounter for other preprocedural examination: Secondary | ICD-10-CM | POA: Insufficient documentation

## 2010-08-24 LAB — BASIC METABOLIC PANEL
BUN: 16 mg/dL (ref 6–23)
CO2: 24 mEq/L (ref 19–32)
Calcium: 9.5 mg/dL (ref 8.4–10.5)
Chloride: 102 mEq/L (ref 96–112)
Creatinine, Ser: 1.24 mg/dL (ref 0.4–1.5)
GFR calc Af Amer: >60 mL/min (ref >60)
GFR calc non Af Amer: 57 mL/min — ABNORMAL LOW (ref >60)
Glucose, Bld: 126 mg/dL — ABNORMAL HIGH (ref 70–99)
Potassium: 4.6 mEq/L (ref 3.5–5.1)
Sodium: 137 mEq/L (ref 135–145)

## 2010-08-24 LAB — CBC
HCT: 36.2 % — ABNORMAL LOW (ref 39.0–52.0)
Hemoglobin: 12.1 g/dL — ABNORMAL LOW (ref 13.0–17.0)
MCH: 27.6 pg (ref 26.0–34.0)
MCHC: 33.4 g/dL (ref 30.0–36.0)
MCV: 82.5 fL (ref 78.0–100.0)
Platelets: 293 10*3/uL (ref 150–400)
RBC: 4.39 MIL/uL (ref 4.22–5.81)
RDW: 13.5 % (ref 11.5–15.5)
WBC: 8.6 10*3/uL (ref 4.0–10.5)

## 2010-08-24 LAB — DIFFERENTIAL
Basophils Absolute: 0 10*3/uL (ref 0.0–0.1)
Basophils Relative: 0 % (ref 0–1)
Eosinophils Absolute: 0.6 10*3/uL (ref 0.0–0.7)
Eosinophils Relative: 6 % — ABNORMAL HIGH (ref 0–5)
Lymphocytes Relative: 34 % (ref 12–46)
Lymphs Abs: 2.9 10*3/uL (ref 0.7–4.0)
Monocytes Absolute: 0.6 10*3/uL (ref 0.1–1.0)
Monocytes Relative: 7 % (ref 3–12)
Neutro Abs: 4.6 10*3/uL (ref 1.7–7.7)
Neutrophils Relative %: 53 % (ref 43–77)

## 2010-08-24 LAB — SURGICAL PCR SCREEN
MRSA, PCR: NEGATIVE
Staphylococcus aureus: NEGATIVE

## 2010-08-27 ENCOUNTER — Ambulatory Visit (HOSPITAL_COMMUNITY)
Admission: RE | Admit: 2010-08-27 | Discharge: 2010-08-27 | Disposition: A | Discharge status: Home / Self Care | Payer: Medicare Other | Source: Ambulatory Visit | Attending: General Surgery | Admitting: General Surgery

## 2010-08-27 DIAGNOSIS — I10 Essential (primary) hypertension: Secondary | ICD-10-CM | POA: Insufficient documentation

## 2010-08-27 DIAGNOSIS — E119 Type 2 diabetes mellitus without complications: Secondary | ICD-10-CM | POA: Insufficient documentation

## 2010-08-27 DIAGNOSIS — T8189XA Other complications of procedures, not elsewhere classified, initial encounter: Secondary | ICD-10-CM | POA: Insufficient documentation

## 2010-08-27 DIAGNOSIS — I2510 Atherosclerotic heart disease of native coronary artery without angina pectoris: Secondary | ICD-10-CM | POA: Insufficient documentation

## 2010-08-27 DIAGNOSIS — Y838 Other surgical procedures as the cause of abnormal reaction of the patient, or of later complication, without mention of misadventure at the time of the procedure: Secondary | ICD-10-CM | POA: Insufficient documentation

## 2010-08-27 LAB — GLUCOSE, CAPILLARY
Glucose-Capillary: 182 mg/dL — ABNORMAL HIGH (ref 70–99)
Glucose-Capillary: 186 mg/dL — ABNORMAL HIGH (ref 70–99)

## 2010-08-30 ENCOUNTER — Other Ambulatory Visit: Payer: Self-pay

## 2010-08-30 NOTE — Op Note (Signed)
  Clayton Harrison, Clayton Harrison NO.:  [id number]  MEDICAL RECORD NO.:  [id number]           PATIENT TYPE:  O  LOCATION:  SDSC                         FACILITY:  [facility]  PHYSICIAN:  Juanetta Gosling, MDDATE OF BIRTH:  March 02, 1931  DATE OF PROCEDURE:  08/27/2010 DATE OF DISCHARGE:  08/27/2010                              OPERATIVE REPORT   PREOPERATIVE DIAGNOSIS:  Chronic abdominal wound status post open right colectomy.  POSTOPERATIVE DIAGNOSIS:  Chronic abdominal wound status post open right colectomy.  PROCEDURE:  Abdominal wound incision and debridement.  SURGEON:  Troy Sine. Dwain Sarna, MD  ASSISTANT:  None.  ANESTHESIA:  General with LMA.  SPECIMENS:  None.  DRAINS:  None.  ESTIMATED BLOOD LOSS:  Minimal.  COMPLICATIONS:  None.  DISPOSITION:  Recovery room in stable condition.  INDICATIONS:  This is a 77 year old male well known to me after he was found to be anemic and had a right colon mass.  He eventually underwent a right open right colectomy due to some significant adhesions from his prior right kidney procedure.  This was a tubulovillous adenoma. Postoperatively, he did well.  He was found later to have a seroma and I opened his wound.  He is a diabetic and has coronary artery disease as well.  I have tried to manage this in the office to get these areas to heal but really had been unsuccessful at doing that, so he and I eventually just discussed going to the operating room and opening this up and debriding this wound as I think there is a cavity that is much larger than the holes on top.  He was agreeable to this.  PROCEDURE:  After informed consent was obtained, the patient was taken to the operating room.  I did give him a gram of cefazolin.  Sequential compression devices were placed to lower extremities prior to induction of anesthesia.  He was placed under anesthesia without complication. His abdomen was prepped and draped in a standard  sterile surgical fashion.  A surgical time-out was then performed.  I made an elliptical incision encompassing the two areas and opened up the chronic cavity.  There was no evidence of hernia or any other source of the chronic cavity.  I debrided this cavity and opened it in its entirety.  I then packed it with gauze and placed a dressing overlying this.  He tolerated this well, was transferred to the PACU in stable condition.     Juanetta Gosling, MD     MCW/MEDQ  D:  08/27/2010  T:  08/27/2010  Job:  [id number]  cc:   Valetta Mole. Swords, MD  Electronically Signed by Emelia Loron MD on 08/28/2010 07:43:20 PM

## 2010-09-04 LAB — GLUCOSE, CAPILLARY
Glucose-Capillary: 116 mg/dL — ABNORMAL HIGH (ref 70–99)
Glucose-Capillary: 137 mg/dL — ABNORMAL HIGH (ref 70–99)
Glucose-Capillary: 145 mg/dL — ABNORMAL HIGH (ref 70–99)
Glucose-Capillary: 148 mg/dL — ABNORMAL HIGH (ref 70–99)
Glucose-Capillary: 149 mg/dL — ABNORMAL HIGH (ref 70–99)
Glucose-Capillary: 149 mg/dL — ABNORMAL HIGH (ref 70–99)
Glucose-Capillary: 150 mg/dL — ABNORMAL HIGH (ref 70–99)
Glucose-Capillary: 157 mg/dL — ABNORMAL HIGH (ref 70–99)
Glucose-Capillary: 158 mg/dL — ABNORMAL HIGH (ref 70–99)
Glucose-Capillary: 161 mg/dL — ABNORMAL HIGH (ref 70–99)
Glucose-Capillary: 164 mg/dL — ABNORMAL HIGH (ref 70–99)
Glucose-Capillary: 166 mg/dL — ABNORMAL HIGH (ref 70–99)
Glucose-Capillary: 169 mg/dL — ABNORMAL HIGH (ref 70–99)
Glucose-Capillary: 171 mg/dL — ABNORMAL HIGH (ref 70–99)
Glucose-Capillary: 178 mg/dL — ABNORMAL HIGH (ref 70–99)
Glucose-Capillary: 186 mg/dL — ABNORMAL HIGH (ref 70–99)
Glucose-Capillary: 190 mg/dL — ABNORMAL HIGH (ref 70–99)
Glucose-Capillary: 190 mg/dL — ABNORMAL HIGH (ref 70–99)
Glucose-Capillary: 192 mg/dL — ABNORMAL HIGH (ref 70–99)
Glucose-Capillary: 208 mg/dL — ABNORMAL HIGH (ref 70–99)
Glucose-Capillary: 211 mg/dL — ABNORMAL HIGH (ref 70–99)
Glucose-Capillary: 214 mg/dL — ABNORMAL HIGH (ref 70–99)
Glucose-Capillary: 233 mg/dL — ABNORMAL HIGH (ref 70–99)

## 2010-09-04 LAB — CBC
HCT: 27.6 % — ABNORMAL LOW (ref 39.0–52.0)
HCT: 33.8 % — ABNORMAL LOW (ref 39.0–52.0)
HCT: 39.7 % (ref 39.0–52.0)
Hemoglobin: 11.4 g/dL — ABNORMAL LOW (ref 13.0–17.0)
Hemoglobin: 9.4 g/dL — ABNORMAL LOW (ref 13.0–17.0)
Hemoglobin: 13.6 g/dL (ref 13.0–17.0)
MCH: 29.5 pg (ref 26.0–34.0)
MCH: 29.7 pg (ref 26.0–34.0)
MCH: 30.4 pg (ref 26.0–34.0)
MCHC: 33.7 g/dL (ref 30.0–36.0)
MCHC: 34.1 g/dL (ref 30.0–36.0)
MCHC: 34.3 g/dL (ref 30.0–36.0)
MCV: 87.1 fL (ref 78.0–100.0)
MCV: 87.3 fL (ref 78.0–100.0)
MCV: 88.5 fL (ref 78.0–100.0)
Platelets: 171 10*3/uL (ref 150–400)
Platelets: 262 10*3/uL (ref 150–400)
Platelets: 220 10*3/uL (ref 150–400)
RBC: 3.17 MIL/uL — ABNORMAL LOW (ref 4.22–5.81)
RBC: 3.87 MIL/uL — ABNORMAL LOW (ref 4.22–5.81)
RBC: 4.48 MIL/uL (ref 4.22–5.81)
RDW: 13.4 % (ref 11.5–15.5)
RDW: 13.5 % (ref 11.5–15.5)
RDW: 14.2 % (ref 11.5–15.5)
WBC: 13.3 10*3/uL — ABNORMAL HIGH (ref 4.0–10.5)
WBC: 6.8 10*3/uL (ref 4.0–10.5)
WBC: 6.8 10*3/uL (ref 4.0–10.5)

## 2010-09-04 LAB — COMPREHENSIVE METABOLIC PANEL
ALT: 23 U/L (ref 0–53)
ALT: 24 U/L (ref 0–53)
AST: 34 U/L (ref 0–37)
AST: 26 U/L (ref 0–37)
Albumin: 3.4 g/dL — ABNORMAL LOW (ref 3.5–5.2)
Albumin: 4.2 g/dL (ref 3.5–5.2)
Alkaline Phosphatase: 26 U/L — ABNORMAL LOW (ref 39–117)
Alkaline Phosphatase: 34 U/L — ABNORMAL LOW (ref 39–117)
BUN: 11 mg/dL (ref 6–23)
BUN: 23 mg/dL (ref 6–23)
CO2: 26 mEq/L (ref 19–32)
CO2: 26 mEq/L (ref 19–32)
Calcium: 8.4 mg/dL (ref 8.4–10.5)
Calcium: 9.6 mg/dL (ref 8.4–10.5)
Chloride: 102 mEq/L (ref 96–112)
Chloride: 100 mEq/L (ref 96–112)
Creatinine, Ser: 1.41 mg/dL (ref 0.4–1.5)
Creatinine, Ser: 1.5 mg/dL (ref 0.4–1.5)
GFR calc Af Amer: 59 mL/min — ABNORMAL LOW (ref >60)
GFR calc Af Amer: 55 mL/min — ABNORMAL LOW (ref >60)
GFR calc non Af Amer: 49 mL/min — ABNORMAL LOW (ref >60)
GFR calc non Af Amer: 45 mL/min — ABNORMAL LOW (ref >60)
Glucose, Bld: 166 mg/dL — ABNORMAL HIGH (ref 70–99)
Glucose, Bld: 175 mg/dL — ABNORMAL HIGH (ref 70–99)
Potassium: 4.2 mEq/L (ref 3.5–5.1)
Potassium: 4.7 mEq/L (ref 3.5–5.1)
Sodium: 137 mEq/L (ref 135–145)
Sodium: 138 mEq/L (ref 135–145)
Total Bilirubin: 1.2 mg/dL (ref 0.3–1.2)
Total Bilirubin: 0.5 mg/dL (ref 0.3–1.2)
Total Protein: 6.1 g/dL (ref 6.0–8.3)
Total Protein: 7.8 g/dL (ref 6.0–8.3)

## 2010-09-04 LAB — BASIC METABOLIC PANEL
BUN: 17 mg/dL (ref 6–23)
BUN: 21 mg/dL (ref 6–23)
BUN: 4 mg/dL — ABNORMAL LOW (ref 6–23)
CO2: 25 mEq/L (ref 19–32)
CO2: 26 mEq/L (ref 19–32)
CO2: 27 mEq/L (ref 19–32)
Calcium: 8.6 mg/dL (ref 8.4–10.5)
Calcium: 8.6 mg/dL (ref 8.4–10.5)
Calcium: 8.7 mg/dL (ref 8.4–10.5)
Chloride: 105 mEq/L (ref 96–112)
Chloride: 106 mEq/L (ref 96–112)
Chloride: 106 mEq/L (ref 96–112)
Creatinine, Ser: 1.08 mg/dL (ref 0.4–1.5)
Creatinine, Ser: 1.7 mg/dL — ABNORMAL HIGH (ref 0.4–1.5)
Creatinine, Ser: 2.34 mg/dL — ABNORMAL HIGH (ref 0.4–1.5)
GFR calc Af Amer: 33 mL/min — ABNORMAL LOW (ref >60)
GFR calc Af Amer: 48 mL/min — ABNORMAL LOW (ref >60)
GFR calc Af Amer: >60 mL/min (ref >60)
GFR calc non Af Amer: 27 mL/min — ABNORMAL LOW (ref >60)
GFR calc non Af Amer: 39 mL/min — ABNORMAL LOW (ref >60)
GFR calc non Af Amer: >60 mL/min (ref >60)
Glucose, Bld: 132 mg/dL — ABNORMAL HIGH (ref 70–99)
Glucose, Bld: 162 mg/dL — ABNORMAL HIGH (ref 70–99)
Glucose, Bld: 175 mg/dL — ABNORMAL HIGH (ref 70–99)
Potassium: 3.8 mEq/L (ref 3.5–5.1)
Potassium: 4.2 mEq/L (ref 3.5–5.1)
Potassium: 5.8 mEq/L — ABNORMAL HIGH (ref 3.5–5.1)
Sodium: 138 mEq/L (ref 135–145)
Sodium: 140 mEq/L (ref 135–145)
Sodium: 140 mEq/L (ref 135–145)

## 2010-09-04 LAB — TYPE AND SCREEN
ABO/RH(D): A POS
Antibody Screen: NEGATIVE

## 2010-09-04 LAB — DIFFERENTIAL
Basophils Absolute: 0 10*3/uL (ref 0.0–0.1)
Basophils Relative: 0 % (ref 0–1)
Eosinophils Absolute: 0.4 10*3/uL (ref 0.0–0.7)
Eosinophils Relative: 6 % — ABNORMAL HIGH (ref 0–5)
Lymphocytes Relative: 32 % (ref 12–46)
Lymphs Abs: 2.2 10*3/uL (ref 0.7–4.0)
Monocytes Absolute: 0.5 10*3/uL (ref 0.1–1.0)
Monocytes Relative: 7 % (ref 3–12)
Neutro Abs: 3.8 10*3/uL (ref 1.7–7.7)
Neutrophils Relative %: 55 % (ref 43–77)

## 2010-09-04 LAB — SURGICAL PCR SCREEN
MRSA, PCR: NEGATIVE
Staphylococcus aureus: NEGATIVE

## 2010-09-04 LAB — CEA: CEA: <0.5 ng/mL (ref 0.0–5.0)

## 2010-09-04 LAB — ABO/RH: ABO/RH(D): A POS

## 2010-09-06 ENCOUNTER — Ambulatory Visit: Payer: Self-pay | Admitting: Internal Medicine

## 2010-09-06 LAB — GLUCOSE, CAPILLARY
Glucose-Capillary: 127 mg/dL — ABNORMAL HIGH (ref 70–99)
Glucose-Capillary: 185 mg/dL — ABNORMAL HIGH (ref 70–99)

## 2010-09-15 ENCOUNTER — Other Ambulatory Visit: Payer: Self-pay | Admitting: Internal Medicine

## 2010-10-20 ENCOUNTER — Encounter (INDEPENDENT_AMBULATORY_CARE_PROVIDER_SITE_OTHER): Payer: Self-pay | Admitting: General Surgery

## 2010-11-01 ENCOUNTER — Other Ambulatory Visit: Payer: Self-pay | Admitting: Internal Medicine

## 2010-11-06 NOTE — Op Note (Signed)
Clayton Harrison, Clayton Harrison NO.:  [id number]   MEDICAL RECORD NO.:  [id number]          PATIENT TYPE:  AMB   LOCATION:  DSC                          FACILITY:  [facility]   PHYSICIAN:  Tennis Must Meyerdierks, M.D.DATE OF BIRTH:  11-Oct-1930   DATE OF PROCEDURE:  12/16/2007  DATE OF DISCHARGE:                               OPERATIVE REPORT   PREOPERATIVE DIAGNOSIS:  Left carpal tunnel syndrome.   POSTOPERATIVE DIAGNOSIS:  Left carpal tunnel syndrome.   PROCEDURE:  Decompression median nerve left carpal tunnel.   SURGEON:  Lowell Bouton, MD   ANESTHESIA:  Marcaine 0.50%  local with sedation.   OPERATIVE FINDINGS:  The patient had no masses in the carpal canal.  The  motor branch of the nerve was intact.   PROCEDURE:  Under 0.50% Marcaine local anesthesia with a tourniquet on  the left arm, the left hand was prepped and draped in the usual fashion.  After exsanguinating the limb, the tourniquet was inflated to 250 mmHg.  A 3-cm longitudinal incision was made in the palm just ulnar to the  thenar crease and carried down through the subcutaneous tissues.  Blunt  dissection was carried through the superficial palmar fascia distal to  the transverse carpal ligament.  An Alm retractor was inserted.  A  hemostat was then placed in the carpal canal up against the hook of the  hamate and the transverse carpal ligament was divided on the ulnar  border of the median nerve.  The proximal end of the ligament was  divided with the scissors after dissecting the nerve away from the  undersurface of the ligament.  The carpal canal was then palpated and  was found to be adequately decompressed.  The nerve was examined and the  motor branch was identified.  The wound was irrigated copiously with  saline.  The skin was closed with 4-0 nylon sutures.  Sterile dressings  were applied followed by volar wrist splint.  The patient tolerated the  procedure well and went to the  recovery room awake in stable and in good  condition.      Lowell Bouton, M.D.  Electronically Signed     EMM/MEDQ  D:  12/16/2007  T:  12/17/2007  Job:  [id number]

## 2010-11-06 NOTE — Assessment & Plan Note (Signed)
[facility]                            CARDIOLOGY OFFICE NOTE   NAME:Clayton Harrison, Clayton Harrison                        MRN:          [id number]  DATE:12/03/2007                            DOB:          Aug 01, 1930    Clayton Harrison is a very pleasant gentleman who has a history of coronary  artery disease, status post coronary artery bypass graft in June 2001.  Since I last saw him, he is doing well.  He denies any dyspnea, chest  pain, palpitations, or syncope.  He was having pain in his legs with  ambulation, but it has completely resolved off his statins and Zetia.   MEDICATIONS:  1. Aspirin 325 mg p.o. daily.  2. Hydrochlorothiazide 12.5 mg p.o. daily.  3. Norvasc 10 mg p.o. daily.  4. Glyburide 10 mg in the morning and 5 in the evening.  5. Janumet 50/1000 b.i.d.  6. Lopressor 50 mg p.o. b.i.d.  7. Lisinopril 20 mg p.o. daily.  8. Omega fish oil.   Physical exam today shows a blood pressure of 135/72 and his pulse is  61.  He weighs 211 pounds.  His HEENT is normal.  His neck is supple.  His chest is clear.  His cardiovascular exam reveals a regular rate and  rhythm.  Abdominal exam shows no tenderness.  Extremities show no edema.   Electrocardiogram shows a sinus rhythm at a rate of 62.  There is a  right bundle-branch block with anterolateral T-wave inversion.  It is  unchanged from previous.  Followup carotid Dopplers today:  Preliminaries show a 60-79% right internal carotid artery stenosis and  40-59% left.   DIAGNOSES:  1. Coronary artery disease status, post coronary artery bypassing      graft - he will continue on his aspirin, ACE inhibitor, and beta-      blocker.  He has not tolerated statins or Zetia in the past.  I      will schedule him to have a Myoview; it has been 3 years since his      previous.  2. Hypertension - his blood pressure is adequately controlled on his      present medications.  We will have his most recent BMET forwarded    to [place] from Dr. Cato Mulligan' office.  3. Hyperlipidemia - he will continue on his fish oil.  He has not      tolerated any statins or Zetia.  4. Question history of claudication - this completely resolved off his      cholesterol medicines.  He is not having symptoms now, and we will      follow this expectantly.  5. Diabetes mellitus - management per Dr. Cato Mulligan.  6. Cerebrovascular disease - he will needfollow-up carotid Dopplers in      6 months.   He will continue with diet and exercise.  He does not smoke.  We will  see him back in 1 year.     Madolyn Frieze Jens Som, MD, [facility]  Electronically Signed    BSC/MedQ  DD: 12/03/2007  DT: 12/04/2007  Job #:  [id number] 

## 2010-11-06 NOTE — Assessment & Plan Note (Signed)
[facility]                            CARDIOLOGY OFFICE NOTE   NAME:Clayton Harrison, Clayton Harrison                        MRN:          [id number]  DATE:12/19/2006                            DOB:          1931-03-16    Clayton Harrison is a very pleasant gentleman who has a history of coronary  artery disease.  He is status post coronary artery bypass grafting in  June 2001.  At that time, he had a LIMA to the LAD, saphenous vein graft  to the circumflex marginal, and a sequential saphenous vein graft to the  second circumflex marginal, and a saphenous vein graft to the PDA.  His  most recent nuclear study was performed on December 05, 2004.  His ejection  fraction was 59%.  There was a distal anterior and apical defect  consistent with subendocardial scar with possible soft tissue  attenuation, but there was no ischemia.  Since I last saw him, he denies  any dyspnea on exertion, orthopnea, PND, pedal edema, palpitations, pre-  syncope, syncope, or chest pain.  He occasionally feels mildly light-  headed with standing suddenly.   MEDICATIONS:  1. Glyburide 5 mg tablets 2 p.o. b.i.d.  2. Aspirin 325 mg p.o. daily.  3. Lopressor 50 mg p.o. b.i.d.  4. Metformin 1000 mg tablets 2 p.o. b.i.d.  5. Lisinopril 20 mg p.o. b.i.d.  6. Hydrochlorothiazide 12.5 mg p.o. daily.  7. Zetia 10 mg p.o. daily.   PHYSICAL EXAM:  Blood pressure 146/76 and the pulse is 59.  He weighs  220 pounds.  HEENT:  Normal.  NECK:  Supple with normal upstroke and no bruits.  CHEST:  Clear.  CARDIOVASCULAR:  Regular rate and rhythm.  ABDOMEN:  Shows no pulsatile masses.  No bruits.  EXTREMITIES:  No edema.   ELECTROCARDIOGRAM:  Sinus rhythm at a rate of 57.  There is a right  bundle branch block and there is lateral T wave inversion.   He did have laboratories drawn on October 09, 2006 with a potassium of 3.8  and a BUN and creatinine of 17 and 1.2.  His liver functions were  normal.  His cholesterol  was 160 with an LDL of 93 and an HDL of 37.   DIAGNOSES:  1. Coronary artery disease status post coronary artery bypass grafting      - the patient is doing well with no chest pain or shortness of      breath.  His last Myoview was low-risk.  We will continue with      medical therapy.  This will include his aspirin, Lopressor, and ACE      inhibitor.  Note, he has not tolerated a Statin in the past and      will, therefore, continue with his Zetia.  He will need a follow up      Myoview in a year when I see him back.  2. Hypertension - his blood pressure is well controlled on his present      medications.  3. Hyperlipidemia - he will continue on his Zetia.  4.  Diabetes mellitus - per his primary care physician.   He will continue with risk factor modification.  He has not smoked since  his surgery.  We discussed the importance of diet and exercise.     Madolyn Frieze Jens Som, MD, [facility]  Electronically Signed    BSC/MedQ  DD: 12/19/2006  DT: 12/19/2006  Job #: [id number]

## 2010-11-06 NOTE — Assessment & Plan Note (Signed)
[facility]                            CARDIOLOGY OFFICE NOTE   NAME:Kirwan, DAXON KYNE                        MRN:          [id number]  DATE:05/07/2007                            DOB:          09-19-1930    Mr. Condie is a pleasant gentleman who has a history of coronary disease,  status post coronary artery bypass grafting in June 2001.  Since I last  saw him he has been seen by Dr. Cato Mulligan and complained of pain in his  hips and buttocks with ambulation.  He states this is similar to when he  was on Statins in the past.  He also tells me that he recently stopped  his Zetia and his symptoms have completely resolved.  Note, he has had  ABIs, most recently these were performed on March 27, 2007.  With  walking his right ABI was 0.65 and on the left it was 0.82.  At 8  minutes, it was 1.07 and then 1.11.  Two minutes post exercise it was  1.01 and 1.03.  We were asked to further evaluate.   He denies any chest pain, dyspnea, or pedal edema.   MEDICATIONS:  1. Glyburide 10 mg p.o. b.i.d.  2. Aspirin 325 mg p.o. daily.  3. Lisinopril 20 mg p.o. b.i.d.  4. Hydrochlorothiazide 12.5 mg p.o. daily.  5. Janumet 2,000 mg p.o. daily.  6. Lopressor 50 mg p.o. b.i.d.  7. Norvasc 10 mg p.o. daily.   PHYSICAL EXAMINATION:  VITAL SIGNS:  Show a blood pressure of 140/79.  His pulse is 64.  HEENT:  Normal.  NECK:  Supple.  CHEST:  Clear.  CARDIOVASCULAR:  Regular rate and rhythm.  ABDOMEN:  Shows no tenderness.  EXTREMITIES:  Show no edema.  He has 2 plus posterior tibial pulse on  the right and a 1 plus on the left.   DIAGNOSES:  1. Coronary artery disease, status post coronary artery bypassing      graft.  He is not having any symptoms and his previous Myoview was      low risk.  We will continue with medical therapy including his      aspirin, Lopressor, and ACE inhibitor.  We will not add a Statin as      he has not tolerated these in the past secondary to  myalgias.  He      will need a Myoview when I see him back in June 2009  2. Hypertension.  His blood pressure is borderline.  This will      continue to be tracked and we will adjust his regimen as indicated.  3. Hyperlipidemia.  We will continue off of all medications as this      may be contributing to his leg pain.  4. Question claudication.  I will review his ABIs with Dr. Excell Seltzer and      make a decision about whether he needs further peripheral vascular      workup.  5. Diabetes mellitus.  Per his primary care physician.  6. Peripheral vascular disease.  He will need followup  carotid      Dopplers in June 2009.   I will see him back as described above.     Madolyn Frieze Jens Som, MD, [facility]  Electronically Signed    BSC/MedQ  DD: 05/07/2007  DT: 05/07/2007  Job #: [id number]

## 2010-11-09 NOTE — Cardiovascular Report (Signed)
[name]. [facility]  Patient:    Clayton Harrison, Clayton Harrison                        MRN: [id number] Proc. Date: 12/13/99 Adm. Date:  [id number] Attending:  Tressie Stalker CC:         _______ ________, M.D.             Madolyn Frieze Jens Som, M.D. [facility]                        Cardiac Catheterization  PROCEDURE:  Coronary arteriography.  INDICATION:  A 77 year old patient with diabetes and unstable angina.  CORONARY ARTERIOGRAPHY:  Somewhat difficult; it was extremely hard to selectively inject the left anterior descending artery.  Multiple catheters were used, the single best RAO image of the LAD was done with a B2 multipurpose catheter and curvet.  FINDINGS: 1. LEFT MAIN CORONARY ARTERY:  Had a 30% tubular stenosis. 2. LEFT ANTERIOR DESCENDING ARTERY:  A small vessel.  There was 40% diffuse    proximal disease.  There was a 50% discrete mid vessel lesion.  The distal    vessel was diffusely diseased (in the 70% range).  There were very good    collaterals from the septal perforators to the right coronary artery,    posterolateral branch and PDA. 4. CIRCUMFLEX CORONARY ARTERY:  A large artery.  There were 30-40% multiple    discrete lesion proximally.  The mid vessel had a 90% complex tubular    lesion at the the takeoff of the large obtuse marginal branch.  The    obtuse marginal branch itself had a long, tubular ostial 95% lesion. 5. RIGHT CORONARY ARTERY:  100% occluded proximally.  There were good    collaterals from the LAD and circumflex.  VENTRICULOGRAPHY:  RAO Ventriculography revealed inferobasilar wall hypokinesis, with an EF in the 50% range.  There was no gradient across the aortic valve and no MR.  HEMODYNAMICS: 1. Left ventricular pressure:  133/22. 2. Aortic pressure:  133/76.  AORTOGRAM:  The left subclavian showed 30-40% diffuse disease.  IMPRESSION:  The patient represents a somewhat difficult case.  His distal vessels are not ideal for bypass.   However, he is a diabetic and the lesion in his circumflex is extremely complex.  I do not think that we could have a good long-lasting result in both the main body of the circumflex and the large obtuse marginal branch.  There are excellent collaterals to the right coronary artery, which I think is bypassable.  I suspect that a vein graft to the LAD would be better leaving the LIMA for the large obtuse marginal branch and circumflex branches.  The case will be reviewed with our interventionalist and also our CVTS colleagues. DD:  12/13/99 TD:  12/15/99 Job: [id number]

## 2010-11-09 NOTE — Discharge Summary (Signed)
[place]. [facility]  Patient:    Clayton Harrison, Clayton Harrison                        MRN: [id number] Adm. Date:  [id number] Disc. Date: 12/21/99 Attending:  Tressie Stalker Dictator:   Loura Pardon, P.A. CC:         Madolyn Frieze. Jens Som, M.D. [facility]             Lilly Cove, M.D.                           Discharge Summary  DATE OF BIRTH: May 09, 1931  FINAL DIAGNOSES:  1. Class 4 unstable angina.  2. Severe three vessel atherosclerotic coronary artery disease.  SECONDARY DIAGNOSES:  1. Type 2 diabetes mellitus.  2. Hypertension.  3. History of tobacco habituation.  4. Acute blood loss anemia postoperatively without transfusion.  5. Chronic lung disease, with pulmonary decompensation postoperative day #1,     prolonging postoperative course.  OPERATIONS/PROCEDURES:  1. On December 13, 1999 the patient underwent left heart catheterization,     performed by Theron Arista C. Eden Emms, M.D.  In this procedure the left main     coronary artery had a 30% tubular stenosis and the left anterior     descending coronary artery had a 40% proximal disease, a 50% discrete mid     vessel lesion.  The distal vessel was diffusely diseased in the 70% range.     The left circumflex had 30-40% multiple discrete lesions proximally.  The     mid vessel had a 90% complex tubular region at the takeoff of a large     obtuse marginal branch.  The obtuse marginal branch itself had a long     tubular ostial 95% lesion.  The right coronary artery was 100% occluded     proximally.  Ejection fraction was 50%.  2. On December 13, 1999 the patient underwent carotid duplex ultrasound, with     study negative for significant internal carotid artery stenosis     bilaterally.  3. Coronary artery bypass graft surgery x 4 on December 14, 1999, performed by     [employer]. Cornelius Moras, M.D.  In this surgical procedure the left internal     mammary artery was connected in end-to-side fashion to the distal left     anterior  descending coronary artery and reverse saphenous vein graft was     fashioned from the aorta to the first circumflex marginal branch, and     sequential reverse saphenous vein graft was fashioned from the aorta to     the second circumflex marginal branch, and reverse saphenous vein graft     was fashioned from the aorta to the posterior descending coronary artery.     The patient tolerated the procedure well and was transferred in stable and     satisfactory condition to the intensive care unit without inotropes, and     had not received blood transfusion.  The rhythm was a sinus rhythm.  DISCHARGE DISPOSITION: [name] is ready for discharge on postoperative day #7.  He has been afebrile throughout his postoperative course.  He has not experienced any cardiac dysrhythmias.  On the evening of postoperative day #1 he experienced respiratory decompensation requiring increased levels of oxygen support.  He was treated with aggressive nebulizer therapy, oxygen support, Lasix diuresis, and steroid with rapid taper prescription.  He had mild renal insufficiency postoperatively with a venous creatinine of 1.6 on postoperative day #2, with trending downward after that.  Creatinine was 1.4 on postoperative day #4.  [name] is ambulating independently.  His incisions are healing nicely.  His pain is controlled with oral analgesia.  His appetite has returned and he has full GI tract function.  DISCHARGE MEDICATIONS:  1. Darvocet-N 100 1-2 tablets p.o. q.3h to q.4h p.r.n. pain.  2. Enteric-coated aspirin 325 mg q.d.  3. Lopressor 50 mg 1/2 tablet, 1-1/2 tablet in the evening.  4. Prinivil 10 mg q.a.m.  5. Nexium 40 mg q.a.m.  6. Glyburide 10 mg b.i.d.  7. Glucophage 1000 mg b.i.d.  8. Wellbutrin SR 150 mg q.d.  9. Lasix 40 mg q.d. for seven days. 10. Potassium chloride 20 mEq q.d. for seven days. 11. Ferrous sulfate 325 mg q.d. for 20 days. 12. Xanax 0.5 mg q.d. as-needed.  DISCHARGE  ACTIVITY: Ambulation as tolerated.  He is asked not to lift more than ten pounds, nor to drive for the next six weeks.  DISCHARGE DIET: Low-sodium/low-cholesterol diet.  WOUND CARE: He may shower daily, keeping the incisions clean and dry.  FOLLOW-UP: He will see Dr. Jens Som in the office in two weeks.  A chest x-ray will be taken.  He will see Dr. Cornelius Moras in the office in three weeks, and he is asked to bring the chest x-ray at that time.  Dr. Barry Dienes office will call [name] with the appointment.  HISTORY OF PRESENT ILLNESS: [name] is a 77 year old male with a medical history of hypertension and diabetes mellitus, and presents with chest pain. He had no prior cardiac history, although he has had a murmur for years.  He states that for the past month he has had occasional substernal chest pain that is described as an aching sensation.  There is radiation to the left shoulder.  There is no associated nausea and vomiting, shortness of breath, or diaphoresis.  The pain typically occurs when he becomes upset or after he eats.  The pain lasts five to six minutes and then resolves.  He has not had chest pain with exertion.  He was seen by Dr. Karilyn Cota and anxiolytics were added including Xanax and Nexium.  Symptoms have resolved since then.  He has had no pain since Sunday of the week prior to this examination.  HOSPITAL COURSE: [name] was admitted to [facility] on December 12, 1999 with a history of substernal chest pain.  Cardiac enzymes were obtained on this admission and these were negative.  He was scheduled for left heart catheterization on December 13, 1999.  This was performed by Dr. Charlton Haws, and the study showed severe three vessel atherosclerotic coronary artery disease, as dictated above.  He also underwent carotid duplex ultrasound, which showed no significant internal carotid artery stenosis.  He was seen by Dr. Cornelius Moras of [facility], who recommended revascularization surgery.  The risks and benefits were described  to Clayton Harrison, and he elected to undergo the proceed.  This was done on December 14, 1999 and four bypasses were placed as previously described.  The patient was transferred in stable and satisfactory condition to the intensive care unit with sinus rhythm, no inotropes given.  He was started on sliding-scale insulin and nebulizer therapy.  Immediately postoperatively his cardiac index was greater than 3.  He was extubated the day of surgery.  On postoperative day #1 cardiac index  was 2.8-3.  He was achieving 94% oxygen saturation on 4 liters nasal cannula.  After surgery chest x-ray showed evidence of chronic obstructive pulmonary disease.  He had mild renal insufficiency, with a creatinine of 1.6.  It was decided to hold his ACE inhibitor until his creatinine had trended downward somewhat.  On postoperative day #2 it was noted he was increasingly short of breath overnight.  The morning of postoperative day #2 he was achieving 91% oxygen saturation on 50% face mask. He was given Lasix 40 mg IV.  Chest x-ray showed pulmonary edema with chronic lung disease present.  He was also started on a rapid steroid taper consisting of hydrocortisone 100 mg IV now and 50 mg q.8h x 3, then started on a prednisone dospak, which he has completed during the remainder of his hospitalization.  On postoperative day #3 he was stable with improving pulmonary reserve and function.  On postoperative day #4 creatinine had fallen to 1.4.  Hemoglobin was 8.4.  ACE inhibitor was restarted.  On postoperative day #5 he was achieving 96% oxygen saturation on 2 liters nasal cannula and he was ambulating independently.  He was still eight pounds fluid overloaded.  On postoperative day #6 he was only four pounds fluid overloaded.  Creatinine was stable at 1.4.  Hemoglobin had risen from 8.4 on postoperative day #4 to 8.9. He was  ambulating independently.  His incisions were healing nicely.  He was taken off all supplemental oxygen, achieving 92% oxygen saturation on room air.  His mental status had remained clear in the postoperative period.  His pain was well controlled with oral analgesia.  He is going home on Darvocet and the medications as dictated above, with some iron supplement for his blood loss anemia. DD:  12/20/99 TD:  12/21/99 Job: [id number]

## 2010-11-09 NOTE — Op Note (Signed)
[place]  Patient:    Clayton Harrison, Clayton Harrison                          MRN: [id number] Proc. Date: 12/14/99 Attending:  Salvatore Decent. Cornelius Moras, M.D. CC:         Madolyn Frieze. Jens Som, M.D. [facility]             Peter C. Eden Emms, M.D. [facility]             Lilly Cove, M.D.                           Operative Report  PREOPERATIVE DIAGNOSIS:  Severe three-vessel coronary artery disease with class 4 unstable angina.  POSTOPERATIVE DIAGNOSIS:  Severe three-vessel coronary artery disease with class 4 unstable angina.  OPERATION PERFORMED:  Median sternotomy for coronary artery bypass grafting x 4 (left internal mammary artery to distal left anterior descending coronary artery, saphenous vein graft to first circumflex marginal branch and sequential saphenous vein graft to second circumflex marginal branch, saphenous vein graft to posterior descending coronary artery).  SURGEON:  Salvatore Decent. Cornelius Moras, M.D.  ASSISTANT:  Ms. Maxwell Marion, CRN, FA  ANESTHESIA:  General.  INDICATIONS FOR PROCEDURE:  The patient is a 77 year old white male from [facility] followed by Dr. Lilly Cove, and referred by Dr. Olga Millers and Dr. Charlton Haws for management of coronary artery disease.  The patient has history of hypertension, type 2 diabetes mellitus, and ongoing tobacco abuse.  He presents with approximately one to two-month history of progressive symptoms of angina.  The day prior to admission the patient was admitted to the hospital with class 4 unstable angina.  He ruled out for acute myocardial infarction.  Cardiac catheterization performed by Dr. Eden Emms demonstrated severe three-vessel coronary artery disease with normal left ventricular function.  OPERATIVE CONSENT.  The patient and his family were counseled at length regarding the indications and potential benefits of coronary artery bypass grafting.  They understand the associated risks of surgery including but  not limited to the risks of death, stroke, myocardial infarction, bleeding requiring blood transfusion, arrhythmia, infection and recurrent coronary artery disease.  They accept these risks as well as any unforeseen complications and agreed to proceed with the surgery as described.  DESCRIPTION OF PROCEDURE:  The patient was brought to the operating room on the above-mentioned date and invasive hemodynamic monitoring was established by the anesthesia service under the care and direction of Dr. Katrinka Blazing.  The patient was placed in the supine position on the operating room.  Following induction of general endotracheal anesthesia, the patients chest, abdomen, both groins and both lower extremities are prepped and draped in sterile manner.  A median sternotomy incision was performed and a left internal mammary artery was dissected from the chest wall and prepared for bypass grafting.  This left internal mammary artery was notably good quality conduit for bypass grafting. Simultaneously saphenous vein was obtained from the patients right lower leg through a papaverine solution.  There was adequate forward flow through the vessel.  Simultaneously saphenous vein was obtained from the patients right lower leg through a series of longitudinal incisions.  The saphenous vein was notably good quality conduit for bypass grafting.  The patient was heparinized systemically.  The pericardium was opened.  The ascending aorta was inspected and was notably free of any palpable plaques or calcifications.  It was mildly thickened and  sclerotic.  Adequate heparinization was verified.  The ascending aorta and the right atrial appendage were cannulated for cardiopulmonary bypass.  Cardiopulmonary bypass was begun and the surface of the heart was inspected. Distal sites were selected for coronary artery bypass grafting.  Portions of the saphenous vein and the left internal mammary artery were trimmed  to appropriate length.  A temperature probe was placed in the left ventricular septum.  A styrofoam pad was placed to protect the left phrenic nerve from thermal injury.  A cardioplegia catheter was placed in the ascending aorta.  The patient was cooled to 30 degrees systemic temperature.  The aortic crossclamp was applied and cardioplegia was delivered in an antegrade fashion through the aortic root.  Additional doses of cardioplegia were administered both through the aortic root and down subsequently placed vein grafts intermittently throughout the crossclamp portion of the operation to maintain septal temperature below 15 degrees centigrade.  Iced slush saline was applied for topical hypothermia.  The following distal coronary anastomoses were performed.  (1) The posterior descending coronary artery was grafted with a saphenous vein graft in an end-to-side fashion using running 7-0 Prolene suture.  This coronary measured 1.6 mm in diameter and was totally occluded proximally.  This coronary was of good quality at the site of distal bypass. (2) The first circumflex marginal branch was grafted with a saphenous vein graft in an side-to-side fashion using running 7-0 Prolene suture.  This coronary artery measured 1.8 mm in diameter and and had a 90% proximal stenosis.  This coronary was of good quality at the site of distal bypass. (3) The second circumflex marginal branch was grafted using a sequential saphenous vein graft off the vein placed at the first circumflex marginal branch.  This coronary artery measured 1.5 mm in diameter at the site of distal bypass.  There was 70 to 80% proximal stenosis.  This coronary was of good quality at the site of distal bypass.  (4) The distal left anterior descending coronary artery was grafted with the left internal mammary artery using running 8-0 Prolene suture.  This coronary artery measured 1.7 mm at the site of distal bypass and was of good  quality.  There was diffuse plaque noted throughout the wall of this vessel intermittently in other positions along the  anterior wall of the left ventricle.  There was 50 to 70% proximal stenosis. Both proximal saphenous vein anastomoses were performed directly to the ascending aorta prior to removal of the aortic crossclamp.  The patient was placed in Trendelenburg position and all air was evacuated from the aortic root.  The septal temperature was noted to rise rapidly and dramatically upon reperfusion of the left internal mammary artery.  The aortic crossclamp was removed after a total crossclamp time of 54 minutes.  The heart began to beat spontaneously without need for cardioversion. Epicardial pacing wires were affixed to the right ventricular outflow tract and to the right atrial appendage.  All proximal and distal anastomoses were inspected for hemostasis and appropriate graft orientation.  The patient was rewarmed to greater than 37 degrees centigrade temperature.  The patient was weaned from cardiopulmonary bypass without difficulty.  The patients rhythm at separation from bypass was normal sinus rhythm.  No inotropic support was required.  Total cardiopulmonary bypass time for the operation was 73 minutes.  The venous and arterial cannulae were removed uneventfully.  Protamine was administered to reverse the anticoagulation.  The mediastinum and the left chest were irrigated with saline solution  containing vancomycin.  Meticulous surgical hemostasis was ascertained.  Transesophageal echocardiogram was performed initially at the beginning of the operation as well as immediately following separation from bypass.  This was performed due to the patients history of a cardiac murmur which was unexplained preoperatively.  There were no signs of any hemodynamically stable valvular abnormalities.  There was trivial mitral regurgitation.  There was no sign of any aortic stenosis or  aortic insufficiency.  No other abnormalities were identified.  Left ventricular function appeared normal.  The mediastinum and the left chest were drained with three chest tubes placed through separate stab incisions inferiorly.  The median sternotomy was closed in a routine fashion.  The right lower extremity incisions were closed in multiple layers in routine fashion.  All skin incisions were closed with subcuticular skin closures.  The patient tolerated the procedure well and was transported to the surgical intensive care unit in stable condition.  There were no intraoperative complications.  All sponge, needle and instrument counts were verified correct at the completion of the operation.  No autologous blood products were administered. DD:  12/14/99 TD:  12/17/99 Job: [id number]

## 2010-11-22 ENCOUNTER — Other Ambulatory Visit: Payer: Self-pay | Admitting: *Deleted

## 2010-11-22 MED ORDER — METOPROLOL TARTRATE 50 MG PO TABS: 50.0000 mg | ORAL_TABLET | Freq: Two times a day (BID) | ORAL | Status: DC

## 2010-11-27 ENCOUNTER — Other Ambulatory Visit (INDEPENDENT_AMBULATORY_CARE_PROVIDER_SITE_OTHER): Payer: Medicare Other

## 2010-11-27 DIAGNOSIS — I10 Essential (primary) hypertension: Secondary | ICD-10-CM

## 2010-11-27 DIAGNOSIS — T887XXA Unspecified adverse effect of drug or medicament, initial encounter: Secondary | ICD-10-CM

## 2010-11-27 DIAGNOSIS — IMO0001 Reserved for inherently not codable concepts without codable children: Secondary | ICD-10-CM

## 2010-11-27 DIAGNOSIS — E785 Hyperlipidemia, unspecified: Secondary | ICD-10-CM

## 2010-11-27 LAB — BASIC METABOLIC PANEL
BUN: 18 mg/dL (ref 6–23)
CO2: 25 mEq/L (ref 19–32)
Calcium: 9.3 mg/dL (ref 8.4–10.5)
Chloride: 104 mEq/L (ref 96–112)
Creatinine, Ser: 1.3 mg/dL (ref 0.4–1.5)
GFR: 59.41 mL/min — ABNORMAL LOW (ref >60.00)
Glucose, Bld: 170 mg/dL — ABNORMAL HIGH (ref 70–99)
Potassium: 4 mEq/L (ref 3.5–5.1)
Sodium: 137 mEq/L (ref 135–145)

## 2010-11-27 LAB — LIPID PANEL
Cholesterol: 153 mg/dL (ref 0–200)
HDL: 35.2 mg/dL — ABNORMAL LOW (ref >39.00)
Total CHOL/HDL Ratio: 4
Triglycerides: 405 mg/dL — ABNORMAL HIGH (ref 0.0–149.0)
VLDL: 81 mg/dL — ABNORMAL HIGH (ref 0.0–40.0)

## 2010-11-27 LAB — HEMOGLOBIN A1C: Hgb A1c MFr Bld: 7.6 % — ABNORMAL HIGH (ref 4.6–6.5)

## 2010-11-27 LAB — LDL CHOLESTEROL, DIRECT: Direct LDL: 96.5 mg/dL

## 2010-11-27 LAB — HEPATIC FUNCTION PANEL
ALT: 19 U/L (ref 0–53)
AST: 16 U/L (ref 0–37)
Albumin: 3.8 g/dL (ref 3.5–5.2)
Alkaline Phosphatase: 57 U/L (ref 39–117)
Bilirubin, Direct: 0.1 mg/dL (ref 0.0–0.3)
Total Bilirubin: 0.1 mg/dL — ABNORMAL LOW (ref 0.3–1.2)
Total Protein: 6.7 g/dL (ref 6.0–8.3)

## 2010-12-03 ENCOUNTER — Encounter: Payer: Self-pay | Admitting: Internal Medicine

## 2010-12-03 ENCOUNTER — Ambulatory Visit (INDEPENDENT_AMBULATORY_CARE_PROVIDER_SITE_OTHER): Payer: Medicare Other | Admitting: Internal Medicine

## 2010-12-03 VITALS — BP 120/74 | HR 80 | Temp 98.1°F | Ht 71.0 in | Wt 212.0 lb

## 2010-12-03 DIAGNOSIS — E119 Type 2 diabetes mellitus without complications: Secondary | ICD-10-CM

## 2010-12-03 DIAGNOSIS — R42 Dizziness and giddiness: Secondary | ICD-10-CM

## 2010-12-03 DIAGNOSIS — E785 Hyperlipidemia, unspecified: Secondary | ICD-10-CM

## 2010-12-03 DIAGNOSIS — I2510 Atherosclerotic heart disease of native coronary artery without angina pectoris: Secondary | ICD-10-CM

## 2010-12-03 DIAGNOSIS — I10 Essential (primary) hypertension: Secondary | ICD-10-CM

## 2010-12-03 MED ORDER — MECLIZINE HCL 50 MG PO TABS: 25.0000 mg | ORAL_TABLET | Freq: Two times a day (BID) | ORAL | Status: DC

## 2010-12-03 NOTE — Progress Notes (Signed)
  Subjective:    Patient ID: Clayton Harrison, male    DOB: 1931/05/22, 77 y.o.   MRN: [id number]  HPI  patient comes in for followup of multiple medical problems including type 2 diabetes, hyperlipidemia, hypertension. The patient does not check blood sugar or blood pressure at home. The patetient does not follow an exercise or diet program. The patient denies any polyuria, polydipsia.  In the past the patient has gone to diabetic treatment center. The patient is tolerating medications  Without difficulty. The patient does admit to medication compliance.   Has had colon surgery---low grade dysplasia complicated by infected wound---required second surgery and secondary closure  Past Medical History  Diagnosis Date  . DM (diabetes mellitus)   . CAD (coronary artery disease)   . Vertigo   . HTN (hypertension)    Past Surgical History  Procedure Date  . Kidney stone surgery 1972  . Heart bypass 2001    4  . Partial colectomy 2011    dr Dwain Sarna...low grade dysplasia    reports that he quit smoking about 11 years ago. He has never used smokeless tobacco. He reports that he does not drink alcohol or use illicit drugs. family history includes Colon cancer in his maternal uncle; Diabetes in his mother; and Heart disease in his father and mother. Allergies  Allergen Reactions  . Atorvastatin     REACTION: urinary frequency  . Ezetimibe     REACTION: "hip problems"  . Pravastatin Sodium     REACTION: myalgia  . Rosuvastatin   . Statins      Review of Systems  patient denies chest pain, shortness of breath, orthopnea. Denies lower extremity edema, abdominal pain, change in appetite, change in bowel movements. Patient denies rashes, musculoskeletal complaints. No other specific complaints in a complete review of systems.      Objective:   Physical Exam  well-developed well-nourished male in no acute distress. HEENT exam atraumatic, normocephalic, neck supple without jugular venous  distention. Chest clear to auscultation cardiac exam S1-S2 are regular. Abdominal exam overweight with bowel sounds, soft and nontender. Extremities no edema. Neurologic exam is alert with a normal gait.        Assessment & Plan:

## 2010-12-13 ENCOUNTER — Telehealth: Payer: Self-pay | Admitting: Cardiology

## 2010-12-13 NOTE — Assessment & Plan Note (Signed)
Patient denies any symptoms. Continue risk factor modification.

## 2010-12-13 NOTE — Assessment & Plan Note (Signed)
Reasonable control. Continue current medications. I've encouraged him to follow a low carbohydrate, low calorie diet. Modest weight loss encouraged. Regular exercise.

## 2010-12-13 NOTE — Telephone Encounter (Signed)
Pt called to request order for carotid doppler, last one 01-16-10

## 2010-12-13 NOTE — Telephone Encounter (Signed)
Spoke with pt, carotid scheduled Clayton Harrison

## 2010-12-13 NOTE — Assessment & Plan Note (Signed)
Adequate control. Continue current medications. 

## 2010-12-19 ENCOUNTER — Encounter: Payer: Self-pay | Admitting: Cardiology

## 2010-12-19 ENCOUNTER — Encounter (INDEPENDENT_AMBULATORY_CARE_PROVIDER_SITE_OTHER): Payer: Medicare Other | Admitting: Cardiology

## 2010-12-19 DIAGNOSIS — I6529 Occlusion and stenosis of unspecified carotid artery: Secondary | ICD-10-CM

## 2010-12-20 ENCOUNTER — Telehealth: Payer: Self-pay | Admitting: Cardiology

## 2010-12-20 NOTE — Telephone Encounter (Signed)
Spoke with pt wife, she is aware of the med change. She will monitor his bp and let us know if his symptoms do not improve Clayton Harrison

## 2010-12-20 NOTE — Telephone Encounter (Signed)
Spoke with Clayton Harrison, she is concerned because Clayton is having dizziness when standing from sitting. His bp today at rest was 111/66. meds confirmed will forward for dr Jens Som review Deliah Goody

## 2010-12-20 NOTE — Telephone Encounter (Signed)
Pt wife calling re test results -pls call [phone]

## 2010-12-20 NOTE — Telephone Encounter (Signed)
Dc hctz, and decrease amlodipine to 5 mg daily; follow symptoms and BP and let us know if BP spikes or symptoms do not improve. Clayton Harrison

## 2010-12-21 ENCOUNTER — Telehealth: Payer: Self-pay | Admitting: Cardiology

## 2010-12-21 NOTE — Telephone Encounter (Signed)
Pt returning  A called back to [facility] .

## 2010-12-21 NOTE — Telephone Encounter (Signed)
Spoke with pt wife, she is aware of the appt to see dr early 01-08-11 @ 9:30am. He has an appt with dr Jens Som the sameday and pt will keep that appt for surgical clearance if needed Deliah Goody

## 2010-12-25 ENCOUNTER — Encounter (INDEPENDENT_AMBULATORY_CARE_PROVIDER_SITE_OTHER): Payer: Medicare Other | Admitting: Vascular Surgery

## 2010-12-25 DIAGNOSIS — I6529 Occlusion and stenosis of unspecified carotid artery: Secondary | ICD-10-CM

## 2010-12-25 NOTE — Consult Note (Signed)
NEW PATIENT CONSULTATION  SHAHRAM, ALEXOPOULOS F DOB:  Sep 24, 1930                                       12/25/2010 ZOXWR#:[id number]  The patient presents today for evaluation of asymptomatic severe right internal carotid artery stenosis.  He is an active 77 year old gentleman who had discovery of the bilateral moderate carotid stenosis at the time of coronary artery bypass grafting in 2001.  He has had serial ultrasound followup since this time with slow progression.  On the most recent study 12/19/2010 at [facility] was found to have a critical stenosis in the right internal carotid artery with patency distal.  This is in the 70%-99% stenosis range.  He remains in the 60%- 79% stenosis range in the left leg.  He specifically denies any amaurosis fugax, transient ischemic attack or stroke.  PAST MEDICAL HISTORY:  Significant for diabetes non-insulin dependent for approximately 25 years.  He is hypertensive.  Does have history of kidney stones, coronary artery bypass grafting in 2001 and colon resection in 2011.  He did have some wound problems following the colon resection but this has now resolved.  SOCIAL HISTORY:  He is married with 2 children.  He is retired from the [employer].  He quit smoking in 2001.  He does not drink alcohol.  FAMILY HISTORY:  Negative for premature atherosclerotic disease.  REVIEW OF SYSTEMS:  No weight loss or gain.  He weighs 210 pounds.  He is 5 feet 11 inches tall.  He does have history of GI reflux, dizziness. Otherwise review of systems was negative.  PHYSICAL EXAMINATION:  General:  Well-developed, well-nourished white male appearing stated age, in no acute stress.  Vital signs:  Blood pressure is 164/76, pulse 57, respirations 16.  HEENT:  Normal.  Neck: His carotids are without bruits bilaterally.  Chest:  His chest is clear bilaterally without rales, rhonchi or wheezes.  Heart:  Regular rate and rhythm without  murmur.  He does have 2+ radial pulses bilaterally. Abdomen:  Reveals moderate obesity.  Musculoskeletal:  Shows no major deformities or cyanosis.  Neurological:  No focal weakness or paresthesias.  Skin:  Without ulcers or rashes.  He does have diminished femoral pulses and I do not palpate pedal pulses on the left.  He has a 1+ dorsalis pedis pulses on the right.  On further questioning he does report what sounds like hip and buttock claudication.  I explained this is not limb threatening but would certainly recommend elective evaluation of this should he have significant level of symptoms.  Regarding his severe carotid stenosis I did explain that he has markedly elevated velocities greater than 600 systolic and 170 diastolic signifying a high-grade stenosis.  I would recommend endarterectomy for reduction of stroke risk.  I discussed this by telephone with Dr. Olga Millers who feels he is an appropriate cardiac risk.  We will schedule his surgery at his earliest convenience.    Larina Earthly, M.D. Electronically Signed  TFE/MEDQ  D:  12/25/2010  T:  12/25/2010  Job:  [id number]  cc:   Valetta Mole. Cato Mulligan, MD Madolyn Frieze. Jens Som, MD, [facility]

## 2010-12-31 ENCOUNTER — Telehealth: Payer: Self-pay | Admitting: Cardiology

## 2010-12-31 NOTE — Telephone Encounter (Signed)
Yes, fu prior to surgery Clayton Harrison

## 2010-12-31 NOTE — Telephone Encounter (Signed)
Pt wants to know should he keep appt on 7/17. Due to going into the hospital on 7/19.

## 2010-12-31 NOTE — Telephone Encounter (Signed)
Spoke with pt, he is scheduled to have the carotid endartectomy by dr early on 01/10/11 and wondered if he needed to be seen. Will forward for dr Jens Som review Deliah Goody

## 2011-01-02 NOTE — Telephone Encounter (Signed)
Spoke with pt, he is aware to keep his appt Clayton Harrison

## 2011-01-03 ENCOUNTER — Encounter: Payer: Self-pay | Admitting: Cardiology

## 2011-01-08 ENCOUNTER — Ambulatory Visit (INDEPENDENT_AMBULATORY_CARE_PROVIDER_SITE_OTHER): Payer: Medicare Other | Admitting: Cardiology

## 2011-01-08 ENCOUNTER — Encounter: Payer: Self-pay | Admitting: Cardiology

## 2011-01-08 VITALS — BP 120/65 | HR 68 | Ht 71.0 in | Wt 210.0 lb

## 2011-01-08 DIAGNOSIS — I739 Peripheral vascular disease, unspecified: Secondary | ICD-10-CM

## 2011-01-08 DIAGNOSIS — Z01810 Encounter for preprocedural cardiovascular examination: Secondary | ICD-10-CM

## 2011-01-08 DIAGNOSIS — I10 Essential (primary) hypertension: Secondary | ICD-10-CM

## 2011-01-08 DIAGNOSIS — I2510 Atherosclerotic heart disease of native coronary artery without angina pectoris: Secondary | ICD-10-CM

## 2011-01-08 DIAGNOSIS — I679 Cerebrovascular disease, unspecified: Secondary | ICD-10-CM

## 2011-01-08 DIAGNOSIS — E785 Hyperlipidemia, unspecified: Secondary | ICD-10-CM

## 2011-01-08 NOTE — Patient Instructions (Signed)
Your physician recommends that you schedule a follow-up appointment in: 6 months with Dr. Jens Som. The office will mail you a reminder letter 2 months prior appointment date.

## 2011-01-08 NOTE — Assessment & Plan Note (Signed)
Managed by primary care. 

## 2011-01-08 NOTE — Assessment & Plan Note (Signed)
Continue aspirin, beta blocker and ACE inhibitor. Intolerant to statins.

## 2011-01-08 NOTE — Assessment & Plan Note (Signed)
No recent symptoms. Last Myoview showed no ischemia. No further cardiac evaluation prior to surgery. Continue all present medications pre-and postoperatively.

## 2011-01-08 NOTE — Assessment & Plan Note (Signed)
Managed by vascular surgery 

## 2011-01-08 NOTE — Assessment & Plan Note (Signed)
Continue aspirin. For carotid endarterectomy on July 19.

## 2011-01-08 NOTE — Assessment & Plan Note (Signed)
Blood pressure controlled. Continue present medications. 

## 2011-01-08 NOTE — Progress Notes (Signed)
HPI:[name] is a very pleasant gentleman who has a history of coronary artery disease, status post coronary artery bypass graft in June 2001. Last Myoview in November of 2011 revealed EF of 61% and Fixed apical perfusion defect representing prior MI versus attenuation.  Fixed basal anteroseptal perfusion defect is likely an artifact due to "short septum". Last carotid Dopplers in June of 2012 showed a 80-99% right stenosis and 60-79 % left. ABIs Jan 2011 were normal. Abdominal ultrasound in June of 2006 showed no aneurysm. I last saw him in Nov 2011. Since then the patient denies any dyspnea on exertion, orthopnea, PND, pedal edema, palpitations, syncope or chest pain. He is scheduled to have carotid endarterectomy and we were asked to evaluate prior to surgery.  Current Outpatient Prescriptions  Medication Sig Dispense Refill  . amLODipine (NORVASC) 10 MG tablet TAKE 1 TABLET BY MOUTH EVERY DAY  30 tablet  3  . Ascorbic Acid (VITAMIN C) 1000 MG tablet Take 1,000 mg by mouth daily.        Marland Kitchen aspirin 325 MG tablet Take 325 mg by mouth daily.        Marland Kitchen glyBURIDE (DIABETA) 5 MG tablet TAKE 2 TABLETS BY MOUTH EVERY MORNING AND TAKE 1 TABLET BY MOUTH EVERY EVENING  90 tablet  2  . JANUMET 50-1000 MG per tablet TAKE 1 TABLET BY MOUTH 2 TIMES A DAY  60 tablet  5  . lansoprazole (PREVACID) 15 MG capsule Take 15 mg by mouth daily.        Marland Kitchen lisinopril (PRINIVIL,ZESTRIL) 20 MG tablet Take 20 mg by mouth at bedtime.        . meclizine (ANTIVERT) 50 MG tablet Take 0.5 tablets (25 mg total) by mouth 2 (two) times daily.  60 tablet  3  . metoprolol (LOPRESSOR) 50 MG tablet Take 1 tablet (50 mg total) by mouth 2 (two) times daily.  60 tablet  5  . omega-3 fish oil (MAXEPA) 1000 MG CAPS capsule Take 1 capsule by mouth 2 (two) times daily.        . vitamin A 8000 UNIT capsule Take 8,000 Units by mouth daily.        . Zinc 50 MG CAPS Take 1 capsule by mouth daily.           Past Medical History  Diagnosis Date  .  DM (diabetes mellitus)   . CAD (coronary artery disease)   . Vertigo   . HTN (hypertension)   . Other and unspecified hyperlipidemia   . Cerebrovascular disease, unspecified   . Elevated PSA   . Interstitial cystitis   . Statin intolerance   . Anemia, blood loss     history of  . PVD (peripheral vascular disease)     Past Surgical History  Procedure Date  . Kidney stone surgery 1972  . Heart bypass 2001    4  . Partial colectomy 2011    dr Dwain Sarna...low grade dysplasia  . Left carpal tunnel release     History   Social History  . Marital Status: Married    Spouse Name: N/A    Number of Children: N/A  . Years of Education: N/A   Occupational History  . Not on file.   Social History Main Topics  . Smoking status: Former Smoker    Quit date: 06/25/1999  . Smokeless tobacco: Never Used  . Alcohol Use: No  . Drug Use: No  . Sexually Active: Not on file   Other  Topics Concern  . Not on file   Social History Narrative   No regular exercise. Daily caffeine- 3-4 cups daily    ROS: no fevers or chills, productive cough, hemoptysis, dysphasia, odynophagia, melena, hematochezia, dysuria, hematuria, rash, seizure activity, orthopnea, PND, pedal edema, claudication. Remaining systems are negative.  Physical Exam: Well-developed well-nourished in no acute distress.  Skin is warm and dry.  HEENT is normal.  Neck is supple. No thyromegaly.  Chest is clear to auscultation with normal expansion.  Cardiovascular exam is regular rate and rhythm.  Abdominal exam nontender or distended. No masses palpated. Extremities show no edema. neuro grossly intact  ECG Normal sinus rhythm at a rate of 68. Right axis deviation. Anterolateral T-wave inversion.

## 2011-01-09 ENCOUNTER — Encounter (HOSPITAL_COMMUNITY)
Admission: RE | Admit: 2011-01-09 | Discharge: 2011-01-09 | Disposition: A | Discharge status: Home / Self Care | Payer: Medicare Other | Source: Ambulatory Visit | Attending: Vascular Surgery | Admitting: Vascular Surgery

## 2011-01-09 ENCOUNTER — Ambulatory Visit (HOSPITAL_COMMUNITY)
Admission: RE | Admit: 2011-01-09 | Discharge: 2011-01-09 | Disposition: A | Discharge status: Home / Self Care | Payer: Medicare Other | Source: Ambulatory Visit | Attending: Vascular Surgery | Admitting: Vascular Surgery

## 2011-01-09 ENCOUNTER — Other Ambulatory Visit: Payer: Self-pay | Admitting: Vascular Surgery

## 2011-01-09 ENCOUNTER — Telehealth: Payer: Self-pay | Admitting: Cardiology

## 2011-01-09 DIAGNOSIS — Z01818 Encounter for other preprocedural examination: Secondary | ICD-10-CM | POA: Insufficient documentation

## 2011-01-09 DIAGNOSIS — Z01812 Encounter for preprocedural laboratory examination: Secondary | ICD-10-CM | POA: Insufficient documentation

## 2011-01-09 DIAGNOSIS — I6521 Occlusion and stenosis of right carotid artery: Secondary | ICD-10-CM

## 2011-01-09 LAB — TYPE AND SCREEN
ABO/RH(D): A POS
Antibody Screen: NEGATIVE

## 2011-01-09 LAB — URINALYSIS, ROUTINE W REFLEX MICROSCOPIC
Bilirubin Urine: NEGATIVE
Glucose, UA: NEGATIVE mg/dL
Hgb urine dipstick: NEGATIVE
Ketones, ur: NEGATIVE mg/dL
Leukocytes, UA: NEGATIVE
Nitrite: NEGATIVE
Protein, ur: NEGATIVE mg/dL
Specific Gravity, Urine: 1.022 (ref 1.005–1.030)
Urobilinogen, UA: 0.2 mg/dL (ref 0.0–1.0)
pH: 5 (ref 5.0–8.0)

## 2011-01-09 LAB — COMPREHENSIVE METABOLIC PANEL
ALT: 20 U/L (ref 0–53)
AST: 17 U/L (ref 0–37)
Albumin: 4.1 g/dL (ref 3.5–5.2)
Alkaline Phosphatase: 58 U/L (ref 39–117)
BUN: 17 mg/dL (ref 6–23)
CO2: 25 mEq/L (ref 19–32)
Calcium: 9.5 mg/dL (ref 8.4–10.5)
Chloride: 101 mEq/L (ref 96–112)
Creatinine, Ser: 1.19 mg/dL (ref 0.50–1.35)
GFR calc Af Amer: >60 mL/min (ref >60)
GFR calc non Af Amer: 59 mL/min — ABNORMAL LOW (ref >60)
Glucose, Bld: 175 mg/dL — ABNORMAL HIGH (ref 70–99)
Potassium: 4.7 mEq/L (ref 3.5–5.1)
Sodium: 136 mEq/L (ref 135–145)
Total Bilirubin: 0.2 mg/dL — ABNORMAL LOW (ref 0.3–1.2)
Total Protein: 7.6 g/dL (ref 6.0–8.3)

## 2011-01-09 LAB — PROTIME-INR
INR: 0.94 (ref 0.00–1.49)
Prothrombin Time: 12.8 seconds (ref 11.6–15.2)

## 2011-01-09 LAB — SURGICAL PCR SCREEN
MRSA, PCR: NEGATIVE
Staphylococcus aureus: NEGATIVE

## 2011-01-09 LAB — CBC
HCT: 39.9 % (ref 39.0–52.0)
Hemoglobin: 13.6 g/dL (ref 13.0–17.0)
MCH: 28.4 pg (ref 26.0–34.0)
MCHC: 34.1 g/dL (ref 30.0–36.0)
MCV: 83.3 fL (ref 78.0–100.0)
Platelets: 270 10*3/uL (ref 150–400)
RBC: 4.79 MIL/uL (ref 4.22–5.81)
RDW: 13.6 % (ref 11.5–15.5)
WBC: 8.3 10*3/uL (ref 4.0–10.5)

## 2011-01-09 LAB — ABO/RH: ABO/RH(D): A POS

## 2011-01-09 LAB — APTT: aPTT: 29 seconds (ref 24–37)

## 2011-01-09 NOTE — Telephone Encounter (Signed)
[facility] faxed to [phone]  01/09/11/km

## 2011-01-10 ENCOUNTER — Inpatient Hospital Stay (HOSPITAL_COMMUNITY)
Admission: RE | Admit: 2011-01-10 | Discharge: 2011-01-11 | DRG: 039 | Disposition: A | Discharge status: Home / Self Care | Payer: Medicare Other | Source: Ambulatory Visit | Attending: Vascular Surgery | Admitting: Vascular Surgery

## 2011-01-10 ENCOUNTER — Other Ambulatory Visit: Payer: Self-pay | Admitting: Vascular Surgery

## 2011-01-10 DIAGNOSIS — Z7982 Long term (current) use of aspirin: Secondary | ICD-10-CM

## 2011-01-10 DIAGNOSIS — E119 Type 2 diabetes mellitus without complications: Secondary | ICD-10-CM | POA: Diagnosis present

## 2011-01-10 DIAGNOSIS — I658 Occlusion and stenosis of other precerebral arteries: Secondary | ICD-10-CM | POA: Diagnosis present

## 2011-01-10 DIAGNOSIS — Z01818 Encounter for other preprocedural examination: Secondary | ICD-10-CM

## 2011-01-10 DIAGNOSIS — I10 Essential (primary) hypertension: Secondary | ICD-10-CM | POA: Diagnosis present

## 2011-01-10 DIAGNOSIS — Z01812 Encounter for preprocedural laboratory examination: Secondary | ICD-10-CM

## 2011-01-10 DIAGNOSIS — Z951 Presence of aortocoronary bypass graft: Secondary | ICD-10-CM

## 2011-01-10 DIAGNOSIS — I6529 Occlusion and stenosis of unspecified carotid artery: Principal | ICD-10-CM | POA: Diagnosis present

## 2011-01-10 DIAGNOSIS — I2510 Atherosclerotic heart disease of native coronary artery without angina pectoris: Secondary | ICD-10-CM | POA: Diagnosis present

## 2011-01-10 DIAGNOSIS — Z87442 Personal history of urinary calculi: Secondary | ICD-10-CM

## 2011-01-10 LAB — GLUCOSE, CAPILLARY
Glucose-Capillary: 163 mg/dL — ABNORMAL HIGH (ref 70–99)
Glucose-Capillary: 122 mg/dL — ABNORMAL HIGH (ref 70–99)
Glucose-Capillary: 79 mg/dL (ref 70–99)
Glucose-Capillary: 124 mg/dL — ABNORMAL HIGH (ref 70–99)

## 2011-01-11 LAB — CBC
HCT: 31.5 % — ABNORMAL LOW (ref 39.0–52.0)
Hemoglobin: 10.5 g/dL — ABNORMAL LOW (ref 13.0–17.0)
MCH: 27.8 pg (ref 26.0–34.0)
MCHC: 33.3 g/dL (ref 30.0–36.0)
MCV: 83.3 fL (ref 78.0–100.0)
Platelets: 170 10*3/uL (ref 150–400)
RBC: 3.78 MIL/uL — ABNORMAL LOW (ref 4.22–5.81)
RDW: 13.8 % (ref 11.5–15.5)
WBC: 6.9 10*3/uL (ref 4.0–10.5)

## 2011-01-11 LAB — BASIC METABOLIC PANEL
BUN: 13 mg/dL (ref 6–23)
CO2: 22 mEq/L (ref 19–32)
Calcium: 8.3 mg/dL — ABNORMAL LOW (ref 8.4–10.5)
Chloride: 107 mEq/L (ref 96–112)
Creatinine, Ser: 1.07 mg/dL (ref 0.50–1.35)
GFR calc Af Amer: >60 mL/min (ref >60)
GFR calc non Af Amer: >60 mL/min (ref >60)
Glucose, Bld: 88 mg/dL (ref 70–99)
Potassium: 4.1 mEq/L (ref 3.5–5.1)
Sodium: 140 mEq/L (ref 135–145)

## 2011-01-11 LAB — GLUCOSE, CAPILLARY: Glucose-Capillary: 179 mg/dL — ABNORMAL HIGH (ref 70–99)

## 2011-01-16 ENCOUNTER — Other Ambulatory Visit: Payer: Self-pay | Admitting: *Deleted

## 2011-01-16 MED ORDER — LISINOPRIL 20 MG PO TABS: 20.0000 mg | ORAL_TABLET | Freq: Every day | ORAL | Status: DC

## 2011-01-21 ENCOUNTER — Other Ambulatory Visit: Payer: Self-pay | Admitting: *Deleted

## 2011-01-21 ENCOUNTER — Ambulatory Visit (INDEPENDENT_AMBULATORY_CARE_PROVIDER_SITE_OTHER): Payer: Medicare Other | Admitting: Family Medicine

## 2011-01-21 ENCOUNTER — Encounter: Payer: Self-pay | Admitting: Family Medicine

## 2011-01-21 ENCOUNTER — Telehealth: Payer: Self-pay | Admitting: Cardiology

## 2011-01-21 VITALS — BP 150/72 | Temp 98.0°F | Wt 211.0 lb

## 2011-01-21 DIAGNOSIS — M67919 Unspecified disorder of synovium and tendon, unspecified shoulder: Secondary | ICD-10-CM

## 2011-01-21 DIAGNOSIS — D1779 Benign lipomatous neoplasm of other sites: Secondary | ICD-10-CM

## 2011-01-21 DIAGNOSIS — L819 Disorder of pigmentation, unspecified: Secondary | ICD-10-CM

## 2011-01-21 DIAGNOSIS — M7580 Other shoulder lesions, unspecified shoulder: Secondary | ICD-10-CM

## 2011-01-21 DIAGNOSIS — D171 Benign lipomatous neoplasm of skin and subcutaneous tissue of trunk: Secondary | ICD-10-CM

## 2011-01-21 DIAGNOSIS — G5600 Carpal tunnel syndrome, unspecified upper limb: Secondary | ICD-10-CM

## 2011-01-21 DIAGNOSIS — M719 Bursopathy, unspecified: Secondary | ICD-10-CM

## 2011-01-21 NOTE — Progress Notes (Signed)
  Subjective:    Patient ID: Clayton Harrison, male    DOB: Nov 16, 1930, 77 y.o.   MRN: [id number]  HPI Left shoulder and wrist pain past several days. Patient had similar episode of left shoulder pain several years ago which responded well to steroid injection. Denies recent injury. No neck pain. He has fairly large lipoma left upper back and pain currently is more shoulder region. Pain with abduction. Fairly good range of motion. Increasing night pain. Improved with oxycodone. No recent fall. No specific upper extreme weakness.  History of left carpal tunnel surgery. For several days if not weeks now had some left hand pain and wrist pain worsened night. Occasionally radiates to hand. No loss of grip. Not using wrist splint.  He has multiple chronic problems including type 2 diabetes, hyperlipidemia, hypertension, CAD, peripheral vascular disease with recent right carotid endarterectomy   Review of Systems  Constitutional: Negative for fever, chills, appetite change and unexpected weight change.  HENT: Negative for sore throat and trouble swallowing.   Respiratory: Negative for cough and shortness of breath.   Cardiovascular: Negative for chest pain, palpitations and leg swelling.  Gastrointestinal: Negative for abdominal pain and blood in stool.  Genitourinary: Negative for dysuria and hematuria.  Musculoskeletal: Positive for back pain and arthralgias. Negative for myalgias and joint swelling.  Neurological: Negative for dizziness and headaches.       Objective:   Physical Exam  Constitutional: He is oriented to person, place, and time. He appears well-developed and well-nourished. No distress.  HENT:  Mouth/Throat: Oropharynx is clear and moist.  Neck: Neck supple.  Cardiovascular: Normal rate and regular rhythm.   Pulmonary/Chest: Effort normal and breath sounds normal. No respiratory distress. He has no wheezes. He has no rales.  Musculoskeletal:       Patient has mild to moderate  pain with abduction against resistance. Good range of motion actively. Minimal if any pain with internal rotation. No a.c. tenderness  Neurological: He is alert and oriented to person, place, and time.       No focal strength deficits upper extremities. No appreciated rotator cuff weakness  Skin:       Incidental note of atypical pigmented lesion right shoulder. This is about 9 mm diameter and has asymmetry, irregular border, and some color variegation  Psychiatric: He has a normal mood and affect.          Assessment & Plan:  #1 left shoulder pain. Suspect rotator cuff tendinitis. Discussed risks and benefits of corticosteroid injection and patient consented.  After prepping skin with betadine, injected 40 mg depomedrol and 2 cc of plain xylocaine with 23 gauge one and one half inch needle using posterior lateral approach and pt tolerate well. X-rays if no better in one to 2 weeks #2 probable recurrent left carpal tunnel. Get back on left wrist splint #3 atypical pigmented lesion right shoulder highly suspicious for melanoma. Patient has seen dermatologist in past will get referred back in  No problem-specific assessment & plan notes found for this encounter.

## 2011-01-21 NOTE — Patient Instructions (Signed)
Get back to regular use of left wrist splint for the next couple of weeks and be in touch if wrist pain not improving over 2-3 weeks Followup with primary physician if shoulder not improving next couple of weeks

## 2011-01-21 NOTE — Telephone Encounter (Signed)
Per pt wife call, pt was experiencing pain in L wrist, triage nurse Fleet Contras)  from Dr. Cato Mulligan office suggested pt contact our office and see if pain pt is experiencing was heart related and should be examined by Dr. Jens Som. Please return call to pt advise/discuss.

## 2011-01-21 NOTE — Telephone Encounter (Signed)
Per wife call, pt is going to see Dr. Docia Furl. Pt and pt wife think triage nurse  over-exaggerated the severity of the situation. Pt and pt wife think pt pain is in cyst in pt shoulder. No need to return call to pt.

## 2011-01-30 ENCOUNTER — Other Ambulatory Visit: Payer: Self-pay | Admitting: Internal Medicine

## 2011-02-05 ENCOUNTER — Ambulatory Visit (INDEPENDENT_AMBULATORY_CARE_PROVIDER_SITE_OTHER): Payer: Medicare Other | Admitting: Vascular Surgery

## 2011-02-05 VITALS — BP 165/81 | HR 62

## 2011-02-05 DIAGNOSIS — Z9889 Other specified postprocedural states: Secondary | ICD-10-CM

## 2011-02-05 DIAGNOSIS — I6529 Occlusion and stenosis of unspecified carotid artery: Secondary | ICD-10-CM

## 2011-02-05 NOTE — Op Note (Signed)
NAMEMALAKHAI, Clayton Harrison NO.:  [id number]  MEDICAL RECORD NO.:  [id number]  LOCATION:  3307                         FACILITY:  [facility]  PHYSICIAN:  Larina Earthly, M.D.    DATE OF BIRTH:  03-29-1931  DATE OF PROCEDURE:  01/10/2011 DATE OF DISCHARGE:                              OPERATIVE REPORT   PREOPERATIVE DIAGNOSIS:  Severe asymptomatic right internal carotid artery stenosis.  POSTOPERATIVE DIAGNOSIS:  Severe asymptomatic right internal carotid artery stenosis.  PROCEDURE:  Right carotid endarterectomy and Dacron patch angioplasty.  SURGEON:  Larina Earthly, MD  ASSISTANT:  Di Kindle. Edilia Bo, MD  ANESTHESIA:  General endotracheal.  COMPLICATIONS:  None.  DISPOSITION:  To recovery room, stable.  PROCEDURE IN DETAIL:  The patient was taken to the operating room, placed in supine position where the right neck was prepped and draped in the usual sterile fashion.  Incision was made in the anterior sternocleidomastoid and carried down through the platysma with electrocautery.  The sternocleidomastoid was reflected posteriorly and the carotid sheath was opened.  The facial vein was ligated with 2-0 silk ties and divided.  The vagus and hypoglossal nerves were identified and preserved.  The common carotid artery was encircled with an umbilical tape and Rumel tourniquets.  The superior thyroid artery was encircled with a 2-0 silk Potts tie.  External carotid was encircled with a blue vessel loop and the internal carotid was encircled with an umbilical tape and Rumel tourniquet.  The patient was given 9000 units of intravenous heparin.  After adequate circulation time, the internal, external, and common carotid arteries were occluded.  The common carotid artery was opened with #11 blade extending longitudinally with Potts scissors through the plaque onto the internal carotid artery with Potts scissors.  A 10 shunt was passed at the internal carotid, allowed  to back bleed and then down the common carotid artery where it was secured with Rumel tourniquets.  The endarterectomy was began on the common carotid artery and the plaque was divided proximally with Potts scissors.  The endarterectomy was carried down to the bifurcation.  The external carotid was endarterectomized with the eversion technique and the internal carotid was endarterectomized in an open fashion. Remaining atheromatous debris was removed from the endarterectomy plane. A Finesse Hemashield Dacron patch was brought on to the field and was sewn as a patch angioplasty with a running 6-0 Prolene suture.  Prior to completion of the closure, the shunt was removed and the usual flushing maneuvers were undertaken.  The anastomosis was completed and flow was restored first to the external then the internal carotid artery. Excellent flow characteristics were noted with hand-held Dopplers in the internal and external carotid arteries. The patient was given 50 mg of protamine to reverse heparin.  The wounds were irrigated with saline.  Hemostasis was obtained with electrocautery.  Wounds were closed with 3-0 Vicryl in the subcutaneous and subcuticular tissue. Benzoin and Steri-Strips were applied.     Larina Earthly, M.D.     TFE/MEDQ  D:  01/10/2011  T:  01/10/2011  Job:  [id number]  cc:   Madolyn Frieze. Jens Som, MD, [facility]  Electronically Signed by  Ily Denno M.D. on 02/05/2011 10:33:48 AM

## 2011-02-05 NOTE — Discharge Summary (Signed)
Clayton Harrison, Clayton Harrison NO.:  [id number]  MEDICAL RECORD NO.:  [id number]  LOCATION:  3307                         FACILITY:  [facility]  PHYSICIAN:  Larina Earthly, M.D.    DATE OF BIRTH:  [birth year]  DATE OF ADMISSION:  01/10/2011 DATE OF DISCHARGE:                              DISCHARGE SUMMARY   ADMIT DIAGNOSIS:  Right internal carotid artery stenosis.  PAST MEDICAL HISTORY/DISCHARGE DIAGNOSES: 1. Asymptomatic right internal carotid artery stenosis status post     right carotid endarterectomy. 2. Non-insulin-dependent diabetes mellitus. 3. Hypertension. 4. History of kidney stones. 5. Coronary artery disease status post coronary artery bypass graft in     2001. 6. Colon resection in 2011. 7. Wound problems with infection status post colon resection, which     resolved.  BRIEF HISTORY:  The patient is a 77 year old male who had discovery of bilateral moderate carotid stenosis at the time of CABG in 2001.  He has been followed with serial ultrasounds since that time with slow progression.  On his most recent evaluation in June 2012, the patient was found to have a critical stenosis in the right internal carotid artery.  The patient has remained asymptomatic.  Secondary to progression of disease, Dr. Arbie Cookey felt that the patient should proceed with carotid endarterectomy for stroke risk reduction.  HOSPITAL COURSE:  The patient was admitted and taken to the OR on January 10, 2011 for a right carotid endarterectomy with Dacron patch angioplasty.  The patient tolerated the procedure well and was hemodynamically stable immediately postoperatively.  He was transferred from the OR to the Post Anesthesia Care Unit in stable condition.  The patient was extubated without complication and woke up anesthesia neurologically intact.  The patient's postoperative course has progressed as expected.  On postoperative day #1, he is without complaint.  He has remained afebrile  with stable vital signs throughout the hospital course.  This morning he has been able to ambulate, void, and tolerate a regular diet without any difficulty.  PHYSICAL EXAMINATION:  CARDIAC:  Regular rate and rhythm. LUNGS:  Clear to auscultation. ABDOMEN:  Soft with active bowel sounds. SKIN:  Incision is clean, dry, intact with no evidence of hematoma. NEUROLOGIC:  He is neurologically intact.  The patient is stable postop day #1, status post right carotid endarterectomy and is felt stable for discharge home.  LABORATORY DATA:  CBC and BMP on January 11, 2011, white count 6.9, hemoglobin 10.5, hematocrit 31.5, platelets 170.  Sodium 140, potassium 4.1, BUN 13, creatinine 1.07.  DISCHARGE INSTRUCTIONS:  The patient received specific written discharge instructions regarding diet, activity and wound care.  He will follow up with Dr. Arbie Cookey 2 weeks after discharge.  The patient is to contact our office with any questions, issues, or problems in the meantime.  DISCHARGE MEDICATIONS: 1. Oxycodone 5 mg 1-2 q.4-6 hours p.r.n. pain. 2. Amlodipine 10 mg daily. 3. Aspirin 325 mg daily. 4. Fish oil 1000 mg b.i.d. 5. Glyburide 5 mg 1-2 tablets b.i.d. 6. Janumet 50/1000 mg 1 p.o. b.i.d. 7. Lisinopril 20 mg daily. 8. Metoprolol 50 mg b.i.d. 9. Prevacid 15 mg daily. 10.Extra Strength Tylenol 500 mg 1-2  q.4-6 hours p.r.n. pain. 11.Vitamin A OTC daily. 12.Vitamin C OTC daily. 13.Zinc OTC daily.     Pecola Leisure, PA   ______________________________ Larina Earthly, M.D.    AY/MEDQ  D:  01/11/2011  T:  01/11/2011  Job:  [id number]  Electronically Signed by Pecola Leisure PA on 01/15/2011 09:14:37 AM Electronically Signed by Ayako Tapanes M.D. on 02/05/2011 10:33:44 AM

## 2011-02-05 NOTE — Progress Notes (Signed)
Subjective:     Patient ID: Clayton Harrison, male   DOB: [birth year], 77 y.o.   MRN: [id number]  HPI The patient presents today for followup of his right carotid endarterectomy on 01/10/2011. He had a severe asymptomatic carotid stenosis. He had no complications following surgery and was discharged home on postoperative day #1. He has continued to do well since his discharge. He has minimal discomfort with his incision and has had no neurologic deficits.  Review of Systems  Constitutional: Negative.   HENT: Negative.   Eyes: Negative.   Respiratory: Negative.   Cardiovascular: Negative.   Gastrointestinal: Negative.   Genitourinary: Negative.   Musculoskeletal: Positive for arthralgias.  Neurological: Positive for dizziness and numbness.  Hematological: Bruises/bleeds easily.  Psychiatric/Behavioral: Negative.        Objective:   Physical Exam Well-healed right carotid incision. No bruits bilaterally.  grossly intact neurologically.   Assessment:     Stable status post right carotid endarterectomy    Plan:     Office visit in 6 months with repeat carotid duplex. The patient knows to report should he develop any wound problems or neurologic deficits

## 2011-02-14 ENCOUNTER — Other Ambulatory Visit: Payer: Self-pay | Admitting: Dermatology

## 2011-02-23 HISTORY — PX: SKIN CANCER EXCISION: SHX779

## 2011-02-27 ENCOUNTER — Other Ambulatory Visit: Payer: Self-pay | Admitting: Internal Medicine

## 2011-02-27 ENCOUNTER — Other Ambulatory Visit (INDEPENDENT_AMBULATORY_CARE_PROVIDER_SITE_OTHER): Payer: Medicare Other

## 2011-02-27 DIAGNOSIS — I10 Essential (primary) hypertension: Secondary | ICD-10-CM

## 2011-02-27 DIAGNOSIS — E119 Type 2 diabetes mellitus without complications: Secondary | ICD-10-CM

## 2011-02-27 LAB — BASIC METABOLIC PANEL
BUN: 23 mg/dL (ref 6–23)
CO2: 24 mEq/L (ref 19–32)
Calcium: 9.1 mg/dL (ref 8.4–10.5)
Chloride: 103 mEq/L (ref 96–112)
Creatinine, Ser: 1.2 mg/dL (ref 0.4–1.5)
GFR: 64.72 mL/min (ref >60.00)
Glucose, Bld: 169 mg/dL — ABNORMAL HIGH (ref 70–99)
Potassium: 4.2 mEq/L (ref 3.5–5.1)
Sodium: 138 mEq/L (ref 135–145)

## 2011-02-27 LAB — LIPID PANEL
Cholesterol: 178 mg/dL (ref 0–200)
HDL: 41 mg/dL (ref >39.00)
Total CHOL/HDL Ratio: 4
Triglycerides: 320 mg/dL — ABNORMAL HIGH (ref 0.0–149.0)
VLDL: 64 mg/dL — ABNORMAL HIGH (ref 0.0–40.0)

## 2011-02-27 LAB — LDL CHOLESTEROL, DIRECT: Direct LDL: 104.4 mg/dL

## 2011-02-27 LAB — HEPATIC FUNCTION PANEL
ALT: 19 U/L (ref 0–53)
AST: 24 U/L (ref 0–37)
Albumin: 4 g/dL (ref 3.5–5.2)
Alkaline Phosphatase: 47 U/L (ref 39–117)
Bilirubin, Direct: 0 mg/dL (ref 0.0–0.3)
Total Bilirubin: 0.4 mg/dL (ref 0.3–1.2)
Total Protein: 7.2 g/dL (ref 6.0–8.3)

## 2011-02-27 LAB — HEMOGLOBIN A1C: Hgb A1c MFr Bld: 7.2 % — ABNORMAL HIGH (ref 4.6–6.5)

## 2011-02-28 ENCOUNTER — Other Ambulatory Visit: Payer: Self-pay | Admitting: Dermatology

## 2011-03-01 ENCOUNTER — Other Ambulatory Visit: Payer: Medicare Other

## 2011-03-11 ENCOUNTER — Ambulatory Visit: Payer: Medicare Other | Admitting: Internal Medicine

## 2011-03-19 ENCOUNTER — Other Ambulatory Visit: Payer: Self-pay | Admitting: Internal Medicine

## 2011-03-20 ENCOUNTER — Encounter: Payer: Self-pay | Admitting: Internal Medicine

## 2011-03-20 ENCOUNTER — Ambulatory Visit (INDEPENDENT_AMBULATORY_CARE_PROVIDER_SITE_OTHER): Payer: Medicare Other | Admitting: Internal Medicine

## 2011-03-20 DIAGNOSIS — E119 Type 2 diabetes mellitus without complications: Secondary | ICD-10-CM

## 2011-03-20 DIAGNOSIS — E785 Hyperlipidemia, unspecified: Secondary | ICD-10-CM

## 2011-03-20 DIAGNOSIS — I2510 Atherosclerotic heart disease of native coronary artery without angina pectoris: Secondary | ICD-10-CM

## 2011-03-20 DIAGNOSIS — Z888 Allergy status to other drugs, medicaments and biological substances status: Secondary | ICD-10-CM

## 2011-03-20 DIAGNOSIS — Z789 Other specified health status: Secondary | ICD-10-CM

## 2011-03-20 NOTE — Assessment & Plan Note (Signed)
Intolerant to statins LDL is fair but not less than 100

## 2011-03-20 NOTE — Progress Notes (Signed)
  Subjective:    Patient ID: Clayton Harrison, male    DOB: 02/18/1931, 77 y.o.   MRN: [id number]  HPI   patient comes in for followup of multiple medical problems including type 2 diabetes, hyperlipidemia, hypertension. The patient does not check blood sugar or blood pressure at home. The patetient does not follow an exercise or diet program. The patient denies any polyuria, polydipsia.  In the past the patient has gone to diabetic treatment center. The patient is tolerating medications  Without difficulty. The patient does admit to medication compliance.   Past Medical History  Diagnosis Date  . DM (diabetes mellitus)   . CAD (coronary artery disease)   . Vertigo   . HTN (hypertension)   . Other and unspecified hyperlipidemia   . Cerebrovascular disease, unspecified   . Elevated PSA   . Interstitial cystitis   . Statin intolerance   . Anemia, blood loss     history of  . PVD (peripheral vascular disease)    Past Surgical History  Procedure Date  . Kidney stone surgery 1972  . Heart bypass 2001    4  . Partial colectomy 2011    dr Dwain Sarna...low grade dysplasia  . Left carpal tunnel release     reports that he quit smoking about 11 years ago. He has never used smokeless tobacco. He reports that he does not drink alcohol or use illicit drugs. family history includes Colon cancer in his maternal uncle; Diabetes in his mother; and Heart disease in his father and mother. Allergies  Allergen Reactions  . Atorvastatin     REACTION: urinary frequency  . Ezetimibe     REACTION: "hip problems"  . Pravastatin Sodium     REACTION: myalgia  . Rosuvastatin   . Statins      Review of Systems  patient denies chest pain, shortness of breath, orthopnea. Denies lower extremity edema, abdominal pain, change in appetite, change in bowel movements. Patient denies rashes, musculoskeletal complaints. No other specific complaints in a complete review of systems.      Objective:   Physical  Exam  well-developed well-nourished male in no acute distress. HEENT exam atraumatic, normocephalic, neck supple without jugular venous distention. Chest clear to auscultation cardiac exam S1-S2 are regular. Abdominal exam overweight with bowel sounds, soft and nontender. Extremities no edema. Neurologic exam is alert with a normal gait.        Assessment & Plan:

## 2011-03-20 NOTE — Assessment & Plan Note (Signed)
a1c and lipids are ok Continue same meds Weight loss would help tremendously

## 2011-03-20 NOTE — Assessment & Plan Note (Signed)
Pt with no sxs Continue risk factor modification

## 2011-03-20 NOTE — Patient Instructions (Signed)
Call your insurance company and see if they will cover shingles vaccine. If they will, call us and we will give it to you  Influenza (flu) Vaccine Flu is a viral illness that spreads from person to person by droplets from the nose and throat. It may cause fever, cough, chills, sore throat, headache, and muscle aches. It can lead to pneumonia and death. Flu affects people of all ages. The elderly and weakened (debilitated) are the most severely affected. WHO SHOULD GET THE INFLUENZA VACCINE?  All people 21 years of age or older.   All residents of facilities housing people with longstanding (chronic) medical conditions.   Children and adults, including pregnant women, with serious long-term health problems. Examples are: heart disease, lung disease, low red blood cells (anemia), kidney disease, metabolic disease, such as diabetes and asthma. It also includes people requiring frequent medical care.   People who are less able to fight infections because of diseases or immune deficiency. This is also true for people who are taking immune suppressing drugs and those receiving cancer chemotherapy or radiation.   Children and teenagers 6 months to 66 years of age on long-term aspirin treatment.  PEOPLE OF LESSER RISK WHO SHOULD RECEIVE THE VACCINE: People having close contact with those who are at high risk. These people include:  Children who live with people of high risk.   Health care workers.   People who work in nursing homes or chronic care facilities.   People who provide home care to high risk persons, such as visiting nurses and volunteers.  PEOPLE OF LEAST RISK WHO SHOULD RECEIVE THE VACCINE:  People providing community service.   People who live or work where large numbers of people are grouped together, such as schools and colleges.   Women who are more than 6 months pregnant or have given birth during the flu season.   People going to tropical climates any time of year or to  countries [place] of the equator between April and September.   Anyone who wants to reduce the chance of catching influenza.  WHEN SHOULD YOU GET THE INFLUENZA VACCINE? Vaccination is necessary every year because the viruses that cause the flu change every year. The vaccine begins to protect you after 1 to 2 weeks and may last up to 1 year. Influenza is most common from December to April, so it is best to get the vaccine in the fall. People 9 years and older need 1 shot each influenza season, while children less than 70 years old may need a second shot after 1 month. WILL THE VACCINE KEEP YOU FROM GETTING THE INFLUENZA THIS YEAR? The influenza viruses change frequently, so the vaccine may not protect you against all strains and will not protect you against other viruses causing flu-like symptoms. The vaccine contains viruses that are the same or similar to those thought to affect people for the coming year. Viruses in the vaccine are killed so you cannot get influenza from the vaccine. Other vaccines which may be given at the same visit include pneumococcal vaccine and all childhood vaccines. Because influenza vaccine may cause fever, children 15 months or older who are getting influenza vaccine with a whooping cough (pertussis) vaccine, should get a diphtheria-tetanus-acellular pertussis (DTaP) vaccine. RISKS FROM INFLUENZA VACCINE?  There is a risk for serious problems after taking the vaccine. However, the risks from the vaccine are much smaller than the risks from getting the disease.   Children less than 59 years old should  be given only split-virus vaccine to reduce chances of side effects. Split-virus vaccines can also be used by adults.   If mild or moderate problems occur, they usually start soon after vaccination and generally last up to 1 to 2 days. These consist of soreness, redness, or swelling where the shot was given, and/or fever, aches, and pains.  TELL YOUR CAREGIVER IF YOU:  Have an  allergy to eggs.   Have had a serious allergic reaction or problem after getting an influenza vaccination.   Were ever paralyzed by Guillain-Barr syndrome.   Are pregnant or think you might be pregnant.   Currently have a moderate or severe illness.  WHAT TO DO IF THERE IS A SERIOUS REACTION:  Call a caregiver or get the person to a caregiver right away.   Write down what happened and the date and time it happened.   Ask your caregiver or health department to file a Vaccine Adverse Event Report Form (VAERS) or call: [phone].  FOR MORE INFORMATION: Ask your caregiver. He or she can give you the vaccine package insert or suggest other sources of information. Document Released: 06/07/2000 Document Re-Released: 09/04/2009 [facility] Patient Information 2011 [place], [place].

## 2011-03-21 LAB — BASIC METABOLIC PANEL
BUN: 17
CO2: 28
Calcium: 9.4
Chloride: 100
Creatinine, Ser: 1.17
GFR calc Af Amer: >60
GFR calc non Af Amer: >60
Glucose, Bld: 159 — ABNORMAL HIGH
Potassium: 4.8
Sodium: 137

## 2011-03-21 LAB — POCT HEMOGLOBIN-HEMACUE: Hemoglobin: 14.2

## 2011-04-01 ENCOUNTER — Other Ambulatory Visit: Payer: Self-pay | Admitting: Internal Medicine

## 2011-04-11 ENCOUNTER — Telehealth: Payer: Self-pay | Admitting: Internal Medicine

## 2011-04-11 MED ORDER — ZOSTER VACCINE LIVE 19400 UNT/0.65ML ~~LOC~~ SOLR: 0.6500 mL | Freq: Once | SUBCUTANEOUS | Status: DC

## 2011-04-11 NOTE — Telephone Encounter (Signed)
PT would like for him and his wife to get a shingles shot. Pt was informed to contact his insurance co and he did and the insurance co told him his doctor office had to contact them for approval. Pt is requesting you contact his ins @[phone]

## 2011-04-11 NOTE — Telephone Encounter (Signed)
We don't have the shingles vaccine.  Pt wants a rx sent to [facility].  Rx sent in electronically

## 2011-04-16 ENCOUNTER — Encounter: Payer: Self-pay | Admitting: Internal Medicine

## 2011-05-01 ENCOUNTER — Ambulatory Visit (INDEPENDENT_AMBULATORY_CARE_PROVIDER_SITE_OTHER): Payer: Medicare Other | Admitting: *Deleted

## 2011-05-01 DIAGNOSIS — Z23 Encounter for immunization: Secondary | ICD-10-CM

## 2011-05-01 DIAGNOSIS — Z2911 Encounter for prophylactic immunotherapy for respiratory syncytial virus (RSV): Secondary | ICD-10-CM

## 2011-05-30 ENCOUNTER — Other Ambulatory Visit: Payer: Self-pay | Admitting: Internal Medicine

## 2011-06-26 ENCOUNTER — Other Ambulatory Visit: Payer: Self-pay | Admitting: *Deleted

## 2011-06-26 MED ORDER — LISINOPRIL 20 MG PO TABS: 20.0000 mg | ORAL_TABLET | Freq: Every day | ORAL | Status: DC

## 2011-07-10 ENCOUNTER — Ambulatory Visit (INDEPENDENT_AMBULATORY_CARE_PROVIDER_SITE_OTHER): Payer: Medicare Other | Admitting: Ophthalmology

## 2011-07-10 DIAGNOSIS — H2510 Age-related nuclear cataract, unspecified eye: Secondary | ICD-10-CM

## 2011-07-10 DIAGNOSIS — E1139 Type 2 diabetes mellitus with other diabetic ophthalmic complication: Secondary | ICD-10-CM

## 2011-07-10 DIAGNOSIS — H3530 Unspecified macular degeneration: Secondary | ICD-10-CM

## 2011-07-10 DIAGNOSIS — E11319 Type 2 diabetes mellitus with unspecified diabetic retinopathy without macular edema: Secondary | ICD-10-CM

## 2011-07-10 DIAGNOSIS — H43819 Vitreous degeneration, unspecified eye: Secondary | ICD-10-CM

## 2011-07-31 ENCOUNTER — Other Ambulatory Visit: Payer: Self-pay | Admitting: Internal Medicine

## 2011-07-31 MED ORDER — GLYBURIDE 5 MG PO TABS: ORAL_TABLET | ORAL | Status: DC

## 2011-07-31 NOTE — Telephone Encounter (Signed)
rx sent in electronically 

## 2011-07-31 NOTE — Telephone Encounter (Signed)
Pt need refill on glyburide 5 mg call [facility]. Pt will be out today

## 2011-07-31 NOTE — Telephone Encounter (Signed)
Wife called stating [facility] attempted to [facility] us for one week re: pt's glyberide and is will be out today.  Is asking to be called today ASAP, please.

## 2011-08-12 ENCOUNTER — Encounter: Payer: Self-pay | Admitting: Vascular Surgery

## 2011-08-13 ENCOUNTER — Ambulatory Visit (INDEPENDENT_AMBULATORY_CARE_PROVIDER_SITE_OTHER): Payer: Medicare Other | Admitting: Vascular Surgery

## 2011-08-13 ENCOUNTER — Encounter: Payer: Self-pay | Admitting: Vascular Surgery

## 2011-08-13 ENCOUNTER — Other Ambulatory Visit (INDEPENDENT_AMBULATORY_CARE_PROVIDER_SITE_OTHER): Payer: Medicare Other | Admitting: *Deleted

## 2011-08-13 VITALS — BP 141/60 | HR 65 | Resp 16 | Ht 71.0 in | Wt 212.0 lb

## 2011-08-13 DIAGNOSIS — I6529 Occlusion and stenosis of unspecified carotid artery: Secondary | ICD-10-CM

## 2011-08-13 DIAGNOSIS — Z48812 Encounter for surgical aftercare following surgery on the circulatory system: Secondary | ICD-10-CM

## 2011-08-13 NOTE — Progress Notes (Signed)
The patient should stay for followup of his right carotid endarterectomy in July of 2012 for severe asymptomatic disease. He has no focal neurologic deficits specifically no amaurosis fugax, transient ischemic attack or stroke. He does have dizziness which is improved with medication treatment. He is stable from a cardiac standpoint.  Past Medical History  Diagnosis Date  . DM (diabetes mellitus)   . CAD (coronary artery disease)   . Vertigo   . HTN (hypertension)   . Other and unspecified hyperlipidemia   . Cerebrovascular disease, unspecified   . Elevated PSA   . Interstitial cystitis   . Statin intolerance   . Anemia, blood loss     history of  . PVD (peripheral vascular disease)     History  Substance Use Topics  . Smoking status: Former Smoker    Quit date: 06/25/1999  . Smokeless tobacco: Never Used  . Alcohol Use: No    Family History  Problem Relation Age of Onset  . Diabetes Mother   . Heart disease Mother   . Heart disease Father   . Colon cancer Maternal Uncle     x4    Allergies  Allergen Reactions  . Atorvastatin     REACTION: urinary frequency  . Ezetimibe     REACTION: "hip problems"  . Pravastatin Sodium     REACTION: myalgia  . Rosuvastatin   . Statins     Current outpatient prescriptions:amLODipine (NORVASC) 10 MG tablet, TAKE 1 TABLET BY MOUTH EVERY DAY, Disp: 30 tablet, Rfl: 3;  Ascorbic Acid (VITAMIN C) 1000 MG tablet, Take 1,000 mg by mouth daily.  , Disp: , Rfl: ;  aspirin 325 MG tablet, Take 325 mg by mouth daily.  , Disp: , Rfl: ;  glyBURIDE (DIABETA) 5 MG tablet, Take 2 tablets po in the morning and 1 tablet po every evening, Disp: 90 tablet, Rfl: 5 JANUMET 50-1000 MG per tablet, TAKE 1 TABLET BY MOUTH 2 TIMES A DAY, Disp: 60 tablet, Rfl: 5;  lansoprazole (PREVACID) 15 MG capsule, Take 15 mg by mouth daily.  , Disp: , Rfl: ;  lisinopril (PRINIVIL,ZESTRIL) 20 MG tablet, Take 1 tablet (20 mg total) by mouth at bedtime., Disp: 30 tablet, Rfl: 4;   metoprolol (LOPRESSOR) 50 MG tablet, TAKE 1 TABLET (50 MG TOTAL) BY MOUTH 2 (TWO) TIMES DAILY., Disp: 60 tablet, Rfl: 5 omega-3 fish oil (MAXEPA) 1000 MG CAPS capsule, Take 1 capsule by mouth 2 (two) times daily.  , Disp: , Rfl: ;  vitamin A 8000 UNIT capsule, Take 8,000 Units by mouth daily.  , Disp: , Rfl: ;  zoster vaccine live, PF, (ZOSTAVAX) 16109 UNT/0.65ML injection, Inject 19,400 Units into the skin once., Disp: 1 vial, Rfl: 0;  Zinc 50 MG CAPS, Take 1 capsule by mouth daily.  , Disp: , Rfl:   BP 141/60  Pulse 65  Resp 16  Ht 5\' 11"  (1.803 m)  Wt 212 lb (96.163 kg)  BMI 29.57 kg/m2  SpO2 97%  Body mass index is 29.57 kg/(m^2).       Physical exam: Well-developed white male in no acute distress HEENT normal Carotid artery incision well-healed right bruits bilaterally Neurologically grossly intact Heart regular rate and rhythm without murmur  Carotid duplex: Widely patent endarterectomy on the right and no change in his moderate 40-59% left internal carotid artery stenosis  Impression and plan: Stable status post right carotid endarterectomy. Continued yearly duplex surveillance of carotid disease

## 2011-08-15 NOTE — Procedures (Unsigned)
CAROTID DUPLEX EXAM  INDICATION:  Followup right CEA  HISTORY: Diabetes:  Yes Cardiac:  Yes Hypertension:  Yes Smoking:  Previous Previous Surgery:  CEA 01/10/2011 CV History: Amaurosis Fugax No, Paresthesias No, Hemiparesis No                                      RIGHT             LEFT Brachial systolic pressure:         148               148 Brachial Doppler waveforms:         WNL               WNL Vertebral direction of flow:        Antegrade         Antegrade DUPLEX VELOCITIES (cm/sec) CCA peak systolic                   103               74 ECA peak systolic                   173               118 ICA peak systolic                   99                183 ICA end diastolic                   22                51 PLAQUE MORPHOLOGY:                                    Heterogeneous PLAQUE AMOUNT:                      None              Moderate PLAQUE LOCATION:                                      ICA  IMPRESSION: 1. Widely patent right carotid endarterectomy without evidence of     restenosis or hyperplasia. 2. 40%-59% left internal carotid artery stenosis. 3. Bilateral vertebral arteries are within normal limits.  ___________________________________________ Larina Earthly, M.D.  LT/MEDQ  D:  08/13/2011  T:  08/13/2011  Job:  [id number]

## 2011-09-04 ENCOUNTER — Other Ambulatory Visit: Payer: Self-pay | Admitting: *Deleted

## 2011-09-04 ENCOUNTER — Other Ambulatory Visit (INDEPENDENT_AMBULATORY_CARE_PROVIDER_SITE_OTHER): Payer: Medicare Other

## 2011-09-04 DIAGNOSIS — E119 Type 2 diabetes mellitus without complications: Secondary | ICD-10-CM

## 2011-09-04 DIAGNOSIS — I6529 Occlusion and stenosis of unspecified carotid artery: Secondary | ICD-10-CM

## 2011-09-04 LAB — HEPATIC FUNCTION PANEL
ALT: 23 U/L (ref 0–53)
AST: 21 U/L (ref 0–37)
Albumin: 4 g/dL (ref 3.5–5.2)
Alkaline Phosphatase: 44 U/L (ref 39–117)
Bilirubin, Direct: 0 mg/dL (ref 0.0–0.3)
Total Bilirubin: 0.1 mg/dL — ABNORMAL LOW (ref 0.3–1.2)
Total Protein: 6.9 g/dL (ref 6.0–8.3)

## 2011-09-04 LAB — BASIC METABOLIC PANEL
BUN: 16 mg/dL (ref 6–23)
CO2: 25 mEq/L (ref 19–32)
Calcium: 9.2 mg/dL (ref 8.4–10.5)
Chloride: 104 mEq/L (ref 96–112)
Creatinine, Ser: 1.3 mg/dL (ref 0.4–1.5)
GFR: 56.67 mL/min — ABNORMAL LOW (ref >60.00)
Glucose, Bld: 187 mg/dL — ABNORMAL HIGH (ref 70–99)
Potassium: 4.2 mEq/L (ref 3.5–5.1)
Sodium: 137 mEq/L (ref 135–145)

## 2011-09-04 LAB — LIPID PANEL
Cholesterol: 177 mg/dL (ref 0–200)
HDL: 38.7 mg/dL — ABNORMAL LOW (ref >39.00)
Total CHOL/HDL Ratio: 5
Triglycerides: 318 mg/dL — ABNORMAL HIGH (ref 0.0–149.0)
VLDL: 63.6 mg/dL — ABNORMAL HIGH (ref 0.0–40.0)

## 2011-09-04 LAB — LDL CHOLESTEROL, DIRECT: Direct LDL: 99.2 mg/dL

## 2011-09-04 LAB — HEMOGLOBIN A1C: Hgb A1c MFr Bld: 7.7 % — ABNORMAL HIGH (ref 4.6–6.5)

## 2011-09-11 ENCOUNTER — Ambulatory Visit (INDEPENDENT_AMBULATORY_CARE_PROVIDER_SITE_OTHER): Payer: Medicare Other | Admitting: Internal Medicine

## 2011-09-11 VITALS — BP 132/86 | HR 84 | Temp 98.1°F | Wt 214.0 lb

## 2011-09-11 DIAGNOSIS — E785 Hyperlipidemia, unspecified: Secondary | ICD-10-CM

## 2011-09-11 DIAGNOSIS — E1151 Type 2 diabetes mellitus with diabetic peripheral angiopathy without gangrene: Secondary | ICD-10-CM

## 2011-09-11 DIAGNOSIS — I10 Essential (primary) hypertension: Secondary | ICD-10-CM

## 2011-09-11 DIAGNOSIS — E1159 Type 2 diabetes mellitus with other circulatory complications: Secondary | ICD-10-CM

## 2011-09-11 DIAGNOSIS — I798 Other disorders of arteries, arterioles and capillaries in diseases classified elsewhere: Secondary | ICD-10-CM

## 2011-09-11 NOTE — Assessment & Plan Note (Signed)
He needs to lose weight Initial goa weight of 180 pounds i will not change meds at this time

## 2011-09-11 NOTE — Assessment & Plan Note (Signed)
Unable to tolerate statins 

## 2011-09-11 NOTE — Progress Notes (Signed)
Patient ID: Clayton Harrison, male   DOB: 02-06-1931, 77 y.o.   MRN: [id number]  patient comes in for followup of multiple medical problems including type 2 diabetes, hyperlipidemia, hypertension. The patient does not check blood sugar or blood pressure at home. The patetient does not follow an exercise or diet program. The patient denies any polyuria, polydipsia.  In the past the patient has gone to diabetic treatment center. The patient is tolerating medications  Without difficulty. The patient does admit to medication compliance.   Past Medical History  Diagnosis Date  . DM (diabetes mellitus)   . CAD (coronary artery disease)   . Vertigo   . HTN (hypertension)   . Other and unspecified hyperlipidemia   . Cerebrovascular disease, unspecified   . Elevated PSA   . Interstitial cystitis   . Statin intolerance   . Anemia, blood loss     history of  . PVD (peripheral vascular disease)     History   Social History  . Marital Status: Married    Spouse Name: N/A    Number of Children: N/A  . Years of Education: N/A   Occupational History  . Not on file.   Social History Main Topics  . Smoking status: Former Smoker    Quit date: 06/25/1999  . Smokeless tobacco: Never Used  . Alcohol Use: No  . Drug Use: No  . Sexually Active: Not on file   Other Topics Concern  . Not on file   Social History Narrative   No regular exercise. Daily caffeine- 3-4 cups daily    Past Surgical History  Procedure Date  . Kidney stone surgery 1972  . Heart bypass 2001    4  . Partial colectomy 2011    dr Dwain Sarna...low grade dysplasia  . Left carpal tunnel release   . Carotid endarterectomy 2012  . Coronary artery bypass graft     Family History  Problem Relation Age of Onset  . Diabetes Mother   . Heart disease Mother   . Heart disease Father   . Colon cancer Maternal Uncle     x4    Allergies  Allergen Reactions  . Atorvastatin     REACTION: urinary frequency  . Ezetimibe    REACTION: "hip problems"  . Pravastatin Sodium     REACTION: myalgia  . Rosuvastatin   . Statins     Current Outpatient Prescriptions on File Prior to Visit  Medication Sig Dispense Refill  . amLODipine (NORVASC) 10 MG tablet TAKE 1 TABLET BY MOUTH EVERY DAY  30 tablet  3  . Ascorbic Acid (VITAMIN C) 1000 MG tablet Take 1,000 mg by mouth daily.        Marland Kitchen aspirin 325 MG tablet Take 325 mg by mouth daily.        Marland Kitchen glyBURIDE (DIABETA) 5 MG tablet Take 2 tablets po in the morning and 1 tablet po every evening  90 tablet  5  . JANUMET 50-1000 MG per tablet TAKE 1 TABLET BY MOUTH 2 TIMES A DAY  60 tablet  5  . lansoprazole (PREVACID) 15 MG capsule Take 15 mg by mouth daily.        Marland Kitchen lisinopril (PRINIVIL,ZESTRIL) 20 MG tablet Take 1 tablet (20 mg total) by mouth at bedtime.  30 tablet  4  . metoprolol (LOPRESSOR) 50 MG tablet TAKE 1 TABLET (50 MG TOTAL) BY MOUTH 2 (TWO) TIMES DAILY.  60 tablet  5  . omega-3 fish oil (MAXEPA) 1000  MG CAPS capsule Take 1 capsule by mouth 2 (two) times daily.        . vitamin A 8000 UNIT capsule Take 8,000 Units by mouth daily.        . Zinc 50 MG CAPS Take 1 capsule by mouth daily.           patient denies chest pain, shortness of breath, orthopnea. Denies lower extremity edema, abdominal pain, change in appetite, change in bowel movements. Patient denies rashes, musculoskeletal complaints (except hip pain with prolonged walking). No other specific complaints in a complete review of systems.   BP 132/86  Pulse 84  Temp(Src) 98.1 F (36.7 C) (Oral)  Wt 214 lb (97.07 kg)  well-developed well-nourished male in no acute distress. HEENT exam atraumatic, normocephalic, neck supple without jugular venous distention. Chest clear to auscultation cardiac exam S1-S2 are regular. Abdominal exam overweight with bowel sounds, soft and nontender. Extremities no edema. Neurologic exam is alert with a normal gait.

## 2011-09-11 NOTE — Assessment & Plan Note (Signed)
BP Readings from Last 3 Encounters:  09/11/11 132/86  08/13/11 141/60  03/20/11 118/72   Fairly well controlled, continue meds

## 2011-10-11 ENCOUNTER — Other Ambulatory Visit: Payer: Self-pay | Admitting: *Deleted

## 2011-10-11 MED ORDER — AMLODIPINE BESYLATE 10 MG PO TABS: 10.0000 mg | ORAL_TABLET | Freq: Every day | ORAL | Status: DC

## 2011-10-23 ENCOUNTER — Other Ambulatory Visit: Payer: Self-pay | Admitting: Internal Medicine

## 2011-10-23 ENCOUNTER — Telehealth: Payer: Self-pay | Admitting: Internal Medicine

## 2011-10-23 NOTE — Telephone Encounter (Signed)
Patient calling to check on rx

## 2011-10-23 NOTE — Telephone Encounter (Signed)
Patient called stating that he need a refill on his janumet and states that the pharmacy has requested this twice. Please assist and per pt inform him when done.

## 2011-10-23 NOTE — Telephone Encounter (Signed)
Janumet sent in electronically

## 2011-11-19 ENCOUNTER — Ambulatory Visit (INDEPENDENT_AMBULATORY_CARE_PROVIDER_SITE_OTHER): Payer: Medicare Other | Admitting: Cardiology

## 2011-11-19 ENCOUNTER — Encounter: Payer: Self-pay | Admitting: Cardiology

## 2011-11-19 VITALS — BP 124/70 | HR 63 | Ht 71.0 in | Wt 216.0 lb

## 2011-11-19 DIAGNOSIS — I2510 Atherosclerotic heart disease of native coronary artery without angina pectoris: Secondary | ICD-10-CM

## 2011-11-19 DIAGNOSIS — I679 Cerebrovascular disease, unspecified: Secondary | ICD-10-CM

## 2011-11-19 DIAGNOSIS — I739 Peripheral vascular disease, unspecified: Secondary | ICD-10-CM

## 2011-11-19 DIAGNOSIS — E785 Hyperlipidemia, unspecified: Secondary | ICD-10-CM

## 2011-11-19 DIAGNOSIS — I10 Essential (primary) hypertension: Secondary | ICD-10-CM

## 2011-11-19 NOTE — Assessment & Plan Note (Signed)
Continue aspirin. Followed by vascular surgery. 

## 2011-11-19 NOTE — Assessment & Plan Note (Signed)
Intolerant to statins. Continue diet. 

## 2011-11-19 NOTE — Assessment & Plan Note (Signed)
Continue aspirin. Intolerant to statins. Continue risk factor modification.

## 2011-11-19 NOTE — Assessment & Plan Note (Signed)
Blood pressure controlled. Continue present medications. 

## 2011-11-19 NOTE — Progress Notes (Signed)
HPI: [name] is a very pleasant gentleman who has a history of coronary artery disease, status post coronary artery bypass graft in June 2001. Last Myoview in November of 2011 revealed EF of 61% and Fixed apical perfusion defect representing prior MI versus attenuation. Fixed basal anteroseptal perfusion defect is likely an artifact due to "short septum".  Abdominal ultrasound in June of 2006 showed no aneurysm. I last saw him in July of 2012. Patient had CEA 7/12. Since then, the patient denies any dyspnea on exertion, orthopnea, PND, pedal edema, palpitations, syncope or chest pain.   Current Outpatient Prescriptions  Medication Sig Dispense Refill  . amLODipine (NORVASC) 10 MG tablet Take 1 tablet (10 mg total) by mouth daily.  30 tablet  5  . Ascorbic Acid (VITAMIN C) 1000 MG tablet Take 1,000 mg by mouth daily.        Marland Kitchen aspirin 325 MG tablet Take 325 mg by mouth daily.        Marland Kitchen glyBURIDE (DIABETA) 5 MG tablet Take 2 tablets po in the morning and 1 tablet po every evening  90 tablet  5  . JANUMET 50-1000 MG per tablet TAKE 1 TABLET BY MOUTH 2 TIMES A DAY  60 tablet  5  . lansoprazole (PREVACID) 15 MG capsule Take 15 mg by mouth daily.        Marland Kitchen lisinopril (PRINIVIL,ZESTRIL) 20 MG tablet Take 1 tablet (20 mg total) by mouth at bedtime.  30 tablet  4  . meclizine (ANTIVERT) 25 MG tablet prn      . metoprolol (LOPRESSOR) 50 MG tablet TAKE 1 TABLET (50 MG TOTAL) BY MOUTH 2 (TWO) TIMES DAILY.  60 tablet  5  . omega-3 fish oil (MAXEPA) 1000 MG CAPS capsule Take 1 capsule by mouth 2 (two) times daily.           Past Medical History  Diagnosis Date  . DM (diabetes mellitus)   . CAD (coronary artery disease)   . Vertigo   . HTN (hypertension)   . Other and unspecified hyperlipidemia   . Cerebrovascular disease, unspecified   . Elevated PSA   . Interstitial cystitis   . Statin intolerance   . Anemia, blood loss     history of  . PVD (peripheral vascular disease)     Past Surgical  History  Procedure Date  . Kidney stone surgery 1972  . Heart bypass 2001    4  . Partial colectomy 2011    dr Dwain Sarna...low grade dysplasia  . Left carpal tunnel release   . Carotid endarterectomy 2012  . Coronary artery bypass graft     History   Social History  . Marital Status: Married    Spouse Name: N/A    Number of Children: N/A  . Years of Education: N/A   Occupational History  . Not on file.   Social History Main Topics  . Smoking status: Former Smoker    Quit date: 06/25/1999  . Smokeless tobacco: Never Used  . Alcohol Use: No  . Drug Use: No  . Sexually Active: Not on file   Other Topics Concern  . Not on file   Social History Narrative   No regular exercise. Daily caffeine- 3-4 cups daily    ROS: problems with bilateral hip pain with ambulation but no fevers or chills, productive cough, hemoptysis, dysphasia, odynophagia, melena, hematochezia, dysuria, hematuria, rash, seizure activity, orthopnea, PND, pedal edema. Remaining systems are negative.  Physical Exam: Well-developed well-nourished in  no acute distress.  Skin is warm and dry.  HEENT is normal. Status post carotid endarterectomy Neck is supple.  Chest is clear to auscultation with normal expansion. Status post sternotomy Cardiovascular exam is regular rate and rhythm.  Abdominal exam nontender or distended. No masses palpated. Status post abdominal surgery Extremities show no edema. neuro grossly intact  ECG sinus rhythm at a rate of 63. Anterior T-wave inversion.

## 2011-11-19 NOTE — Assessment & Plan Note (Signed)
Followed by vascular surgery. 

## 2011-11-19 NOTE — Patient Instructions (Signed)
Your physician wants you to follow-up in: ONE YEAR WITH DR CRENSHAW You will receive a reminder letter in the mail two months in advance. If you don't receive a letter, please call our office to schedule the follow-up appointment.  

## 2011-12-09 ENCOUNTER — Other Ambulatory Visit: Payer: Self-pay | Admitting: Internal Medicine

## 2011-12-16 ENCOUNTER — Other Ambulatory Visit: Payer: Self-pay | Admitting: Internal Medicine

## 2011-12-18 ENCOUNTER — Other Ambulatory Visit: Payer: Self-pay | Admitting: Internal Medicine

## 2012-01-13 ENCOUNTER — Ambulatory Visit (INDEPENDENT_AMBULATORY_CARE_PROVIDER_SITE_OTHER): Payer: Medicare Other | Admitting: Family Medicine

## 2012-01-13 ENCOUNTER — Encounter: Payer: Self-pay | Admitting: Family Medicine

## 2012-01-13 VITALS — BP 150/68 | Temp 98.4°F | Wt 216.0 lb

## 2012-01-13 DIAGNOSIS — M545 Low back pain, unspecified: Secondary | ICD-10-CM

## 2012-01-13 MED ORDER — METHOCARBAMOL 750 MG PO TABS: ORAL_TABLET | ORAL | Status: DC

## 2012-01-13 NOTE — Progress Notes (Signed)
Subjective:    Patient ID: Clayton Harrison, male    DOB: 10-18-1930, 78 y.o.   MRN: [id number]  HPI  Acute visit. Patient presents with left lumbar back pain. Approximately 4-6 weeks duration. Pain comes and goes. Denies specific injury. Did do some lifting recently which may have exacerbated. Occasional pain radiating into buttocks bilaterally. Pain worse with position change. Generally tolerates walking fairly well. No urinary symptoms. No appetite or weight changes. Denies any numbness or weakness. His daughter had some type of muscle relaxer which he took last evening which helped somewhat. He's tried topical heat without much relief.  His chronic problems include history of type diabetes, hyperlipidemia, hypertension, CAD, and peripheral vascular disease. No claudication-type symptoms.  Patient had melanoma excision right shoulder/upper back last year. This has been noted incidentally when he presented with right shoulder pain.  Past Medical History  Diagnosis Date  . DM (diabetes mellitus)   . CAD (coronary artery disease)   . Vertigo   . HTN (hypertension)   . Other and unspecified hyperlipidemia   . Cerebrovascular disease, unspecified   . Elevated PSA   . Interstitial cystitis   . Statin intolerance   . Anemia, blood loss     history of  . PVD (peripheral vascular disease)    Past Surgical History  Procedure Date  . Kidney stone surgery 1972  . Heart bypass 2001    4  . Partial colectomy 2011    dr Dwain Sarna...low grade dysplasia  . Left carpal tunnel release   . Carotid endarterectomy 2012  . Coronary artery bypass graft     reports that he quit smoking about 12 years ago. He has never used smokeless tobacco. He reports that he does not drink alcohol or use illicit drugs. family history includes Colon cancer in his maternal uncle; Diabetes in his mother; and Heart disease in his father and mother. Allergies  Allergen Reactions  . Atorvastatin     REACTION: urinary  frequency  . Ezetimibe     REACTION: "hip problems"  . Pravastatin Sodium     REACTION: myalgia  . Rosuvastatin   . Statins      Review of Systems  Constitutional: Negative for fever, chills, appetite change and unexpected weight change.  Cardiovascular: Negative for chest pain.  Gastrointestinal: Negative for nausea, vomiting and abdominal pain.  Genitourinary: Negative for dysuria and decreased urine volume.  Musculoskeletal: Positive for back pain. Negative for myalgias.  Skin: Negative for rash.  Neurological: Negative for weakness.  Hematological: Negative for adenopathy. Does not bruise/bleed easily.       Objective:   Physical Exam  Constitutional: He is oriented to person, place, and time. He appears well-developed and well-nourished.  Cardiovascular: Normal rate and regular rhythm.   Pulmonary/Chest: Effort normal and breath sounds normal. No respiratory distress. He has no wheezes. He has no rales.  Musculoskeletal: He exhibits no edema.       Straight leg raise is negative bilaterally  Patient has some palpable muscle tension left lower lumbar region c/w right.  No spinal tenderness  Neurological: He is alert and oriented to person, place, and time.       Patient has no focal weakness lower extremities. Symmetric deep tender reflexes knee and ankle bilaterally.          Assessment & Plan:  Lumbar back pain. Nonfocal neurologic exam. He has evidence for probably some muscle spasm on exam. Very cautious use of muscle relaxer given his age. This  worked for him previously including 1 his daughter gave him recently. We've given a limited number of Robaxin 750 mg 1 each bedtime. Continue moist heat. Suggested some stretches. Consider physical therapy not improving in the next one or 2 weeks

## 2012-01-13 NOTE — Patient Instructions (Addendum)

## 2012-01-17 ENCOUNTER — Other Ambulatory Visit: Payer: Self-pay | Admitting: *Deleted

## 2012-01-17 MED ORDER — MECLIZINE HCL 25 MG PO TABS: 12.5000 mg | ORAL_TABLET | Freq: Two times a day (BID) | ORAL | Status: DC | PRN

## 2012-02-23 LAB — HM DIABETES EYE EXAM

## 2012-03-06 ENCOUNTER — Other Ambulatory Visit (INDEPENDENT_AMBULATORY_CARE_PROVIDER_SITE_OTHER): Payer: Medicare Other

## 2012-03-06 DIAGNOSIS — E785 Hyperlipidemia, unspecified: Secondary | ICD-10-CM

## 2012-03-06 DIAGNOSIS — E1159 Type 2 diabetes mellitus with other circulatory complications: Secondary | ICD-10-CM

## 2012-03-06 DIAGNOSIS — Z79899 Other long term (current) drug therapy: Secondary | ICD-10-CM

## 2012-03-06 DIAGNOSIS — E1151 Type 2 diabetes mellitus with diabetic peripheral angiopathy without gangrene: Secondary | ICD-10-CM

## 2012-03-06 DIAGNOSIS — I10 Essential (primary) hypertension: Secondary | ICD-10-CM

## 2012-03-06 DIAGNOSIS — I798 Other disorders of arteries, arterioles and capillaries in diseases classified elsewhere: Secondary | ICD-10-CM

## 2012-03-06 LAB — LIPID PANEL
Cholesterol: 183 mg/dL (ref 0–200)
HDL: 34.3 mg/dL — ABNORMAL LOW (ref >39.00)
Total CHOL/HDL Ratio: 5
Triglycerides: 353 mg/dL — ABNORMAL HIGH (ref 0.0–149.0)
VLDL: 70.6 mg/dL — ABNORMAL HIGH (ref 0.0–40.0)

## 2012-03-06 LAB — BASIC METABOLIC PANEL
BUN: 15 mg/dL (ref 6–23)
CO2: 25 mEq/L (ref 19–32)
Calcium: 9 mg/dL (ref 8.4–10.5)
Chloride: 103 mEq/L (ref 96–112)
Creatinine, Ser: 1.3 mg/dL (ref 0.4–1.5)
GFR: 59.22 mL/min — ABNORMAL LOW (ref >60.00)
Glucose, Bld: 218 mg/dL — ABNORMAL HIGH (ref 70–99)
Potassium: 4.5 mEq/L (ref 3.5–5.1)
Sodium: 136 mEq/L (ref 135–145)

## 2012-03-06 LAB — HEPATIC FUNCTION PANEL
ALT: 19 U/L (ref 0–53)
AST: 25 U/L (ref 0–37)
Albumin: 4.1 g/dL (ref 3.5–5.2)
Alkaline Phosphatase: 45 U/L (ref 39–117)
Bilirubin, Direct: 0 mg/dL (ref 0.0–0.3)
Total Bilirubin: 0.5 mg/dL (ref 0.3–1.2)
Total Protein: 7.3 g/dL (ref 6.0–8.3)

## 2012-03-06 LAB — LDL CHOLESTEROL, DIRECT: Direct LDL: 104.2 mg/dL

## 2012-03-06 LAB — HEMOGLOBIN A1C: Hgb A1c MFr Bld: 7.4 % — ABNORMAL HIGH (ref 4.6–6.5)

## 2012-03-13 ENCOUNTER — Ambulatory Visit: Payer: Medicare Other | Admitting: Internal Medicine

## 2012-03-18 ENCOUNTER — Ambulatory Visit (INDEPENDENT_AMBULATORY_CARE_PROVIDER_SITE_OTHER): Payer: Medicare Other | Admitting: Internal Medicine

## 2012-03-18 ENCOUNTER — Encounter: Payer: Self-pay | Admitting: Internal Medicine

## 2012-03-18 VITALS — BP 134/76 | HR 76 | Temp 98.0°F | Wt 213.0 lb

## 2012-03-18 DIAGNOSIS — E1159 Type 2 diabetes mellitus with other circulatory complications: Secondary | ICD-10-CM

## 2012-03-18 DIAGNOSIS — I10 Essential (primary) hypertension: Secondary | ICD-10-CM

## 2012-03-18 DIAGNOSIS — I798 Other disorders of arteries, arterioles and capillaries in diseases classified elsewhere: Secondary | ICD-10-CM

## 2012-03-18 DIAGNOSIS — I2510 Atherosclerotic heart disease of native coronary artery without angina pectoris: Secondary | ICD-10-CM

## 2012-03-18 DIAGNOSIS — E1151 Type 2 diabetes mellitus with diabetic peripheral angiopathy without gangrene: Secondary | ICD-10-CM

## 2012-03-18 DIAGNOSIS — D649 Anemia, unspecified: Secondary | ICD-10-CM

## 2012-03-18 LAB — HM DIABETES FOOT EXAM

## 2012-03-18 MED ORDER — GLYBURIDE 5 MG PO TABS: 5.0000 mg | ORAL_TABLET | Freq: Two times a day (BID) | ORAL | Status: DC

## 2012-03-18 NOTE — Assessment & Plan Note (Signed)
BP Readings from Last 3 Encounters:  03/18/12 134/76  01/13/12 150/68  11/19/11 124/70   Fair control Continue same meds

## 2012-03-18 NOTE — Assessment & Plan Note (Signed)
No sxs Continue risk factor modification 

## 2012-03-18 NOTE — Assessment & Plan Note (Signed)
He needs to lose weight He is having symptomatic hypoglycemia- Decrease glyburide to 5 mg bid Exercise every day  He refuse flu imunization

## 2012-03-18 NOTE — Assessment & Plan Note (Signed)
He will need f/u

## 2012-03-18 NOTE — Progress Notes (Signed)
Patient ID: Clayton Harrison, male   DOB: 09-21-1930, 78 y.o.   MRN: [id number]   patient comes in for followup of multiple medical problems including type 2 diabetes, hyperlipidemia, hypertension. The patient does  check blood sugar (cbgs can be 70-250) but not blood pressure at home. He has noted The patetient does not follow an exercise or diet program. The patient denies any polyuria, polydipsia.  In the past the patient has gone to diabetic treatment center. The patient is tolerating medications  Without difficulty. The patient does admit to medication compliance.   Past Medical History  Diagnosis Date  . DM (diabetes mellitus)   . CAD (coronary artery disease)   . Vertigo   . HTN (hypertension)   . Other and unspecified hyperlipidemia   . Cerebrovascular disease, unspecified   . Elevated PSA   . Interstitial cystitis   . Statin intolerance   . Anemia, blood loss     history of  . PVD (peripheral vascular disease)     History   Social History  . Marital Status: Married    Spouse Name: N/A    Number of Children: N/A  . Years of Education: N/A   Occupational History  . Not on file.   Social History Main Topics  . Smoking status: Former Smoker    Quit date: 06/25/1999  . Smokeless tobacco: Never Used  . Alcohol Use: No  . Drug Use: No  . Sexually Active: Not on file   Other Topics Concern  . Not on file   Social History Narrative   No regular exercise. Daily caffeine- 3-4 cups daily    Past Surgical History  Procedure Date  . Kidney stone surgery 1972  . Heart bypass 2001    4  . Partial colectomy 2011    dr Dwain Sarna...low grade dysplasia  . Left carpal tunnel release   . Carotid endarterectomy 2012  . Coronary artery bypass graft     Family History  Problem Relation Age of Onset  . Diabetes Mother   . Heart disease Mother   . Heart disease Father   . Colon cancer Maternal Uncle     x4    Allergies  Allergen Reactions  . Atorvastatin     REACTION:  urinary frequency  . Ezetimibe     REACTION: "hip problems"  . Pravastatin Sodium     REACTION: myalgia  . Rosuvastatin   . Statins     Current Outpatient Prescriptions on File Prior to Visit  Medication Sig Dispense Refill  . amLODipine (NORVASC) 10 MG tablet Take 1 tablet (10 mg total) by mouth daily.  30 tablet  5  . aspirin 325 MG tablet Take 325 mg by mouth daily.        Marland Kitchen glyBURIDE (DIABETA) 5 MG tablet Take 2 tablets po in the morning and 1 tablet po every evening  90 tablet  5  . JANUMET 50-1000 MG per tablet TAKE 1 TABLET BY MOUTH 2 TIMES A DAY  60 tablet  5  . lansoprazole (PREVACID) 15 MG capsule Take 15 mg by mouth daily.        Marland Kitchen lisinopril (PRINIVIL,ZESTRIL) 20 MG tablet TAKE 1 TABLET (20 MG TOTAL) BY MOUTH AT BEDTIME.  30 tablet  4  . meclizine (ANTIVERT) 25 MG tablet Take 0.5 tablets (12.5 mg total) by mouth 2 (two) times daily as needed. prn  120 tablet  1  . methocarbamol (ROBAXIN) 750 MG tablet 1-2 po qhs prn back  pain and muscle spasm.  30 tablet  0  . metoprolol (LOPRESSOR) 50 MG tablet TAKE 1 TABLET (50 MG TOTAL) BY MOUTH 2 (TWO) TIMES DAILY.  60 tablet  5     patient denies chest pain, shortness of breath, orthopnea. Denies lower extremity edema, abdominal pain, change in appetite, change in bowel movements. Patient denies rashes, musculoskeletal complaints. No other specific complaints in a complete review of systems.   BP 134/76  Pulse 76  Temp 98 F (36.7 C) (Oral)  Wt 213 lb (96.616 kg) elderly male in no acute distress. HEENT exam atraumatic, normocephalic, neck supple without jugular venous distention. Chest clear to auscultation cardiac exam S1-S2 are regular. Abdominal exam overweight with bowel sounds, soft and nontender. Extremities no edema. Neurologic exam is alert with a normal gait.

## 2012-03-18 NOTE — Patient Instructions (Signed)
You have refused the flu shot. This will put you at high risk of getting influenza and its complications

## 2012-03-20 ENCOUNTER — Encounter: Payer: Self-pay | Admitting: Internal Medicine

## 2012-04-14 ENCOUNTER — Other Ambulatory Visit: Payer: Self-pay | Admitting: Internal Medicine

## 2012-04-22 ENCOUNTER — Other Ambulatory Visit: Payer: Self-pay | Admitting: Internal Medicine

## 2012-05-26 ENCOUNTER — Other Ambulatory Visit: Payer: Self-pay | Admitting: Internal Medicine

## 2012-06-26 ENCOUNTER — Other Ambulatory Visit: Payer: Self-pay | Admitting: Internal Medicine

## 2012-07-10 ENCOUNTER — Ambulatory Visit (INDEPENDENT_AMBULATORY_CARE_PROVIDER_SITE_OTHER): Payer: Medicare Other | Admitting: Ophthalmology

## 2012-07-22 ENCOUNTER — Ambulatory Visit (INDEPENDENT_AMBULATORY_CARE_PROVIDER_SITE_OTHER): Payer: Medicare Other | Admitting: Ophthalmology

## 2012-07-22 DIAGNOSIS — H35039 Hypertensive retinopathy, unspecified eye: Secondary | ICD-10-CM

## 2012-07-22 DIAGNOSIS — H3530 Unspecified macular degeneration: Secondary | ICD-10-CM

## 2012-07-22 DIAGNOSIS — H43819 Vitreous degeneration, unspecified eye: Secondary | ICD-10-CM

## 2012-07-22 DIAGNOSIS — I10 Essential (primary) hypertension: Secondary | ICD-10-CM

## 2012-07-22 DIAGNOSIS — E11319 Type 2 diabetes mellitus with unspecified diabetic retinopathy without macular edema: Secondary | ICD-10-CM

## 2012-07-22 DIAGNOSIS — E1139 Type 2 diabetes mellitus with other diabetic ophthalmic complication: Secondary | ICD-10-CM

## 2012-07-22 DIAGNOSIS — H2510 Age-related nuclear cataract, unspecified eye: Secondary | ICD-10-CM

## 2012-08-13 ENCOUNTER — Ambulatory Visit (INDEPENDENT_AMBULATORY_CARE_PROVIDER_SITE_OTHER): Payer: Medicare Other | Admitting: Family Medicine

## 2012-08-13 ENCOUNTER — Encounter: Payer: Self-pay | Admitting: Family Medicine

## 2012-08-13 VITALS — BP 138/68 | HR 69 | Ht 71.0 in | Wt 215.0 lb

## 2012-08-13 DIAGNOSIS — E663 Overweight: Secondary | ICD-10-CM | POA: Insufficient documentation

## 2012-08-13 DIAGNOSIS — I798 Other disorders of arteries, arterioles and capillaries in diseases classified elsewhere: Secondary | ICD-10-CM

## 2012-08-13 DIAGNOSIS — I10 Essential (primary) hypertension: Secondary | ICD-10-CM

## 2012-08-13 DIAGNOSIS — E1151 Type 2 diabetes mellitus with diabetic peripheral angiopathy without gangrene: Secondary | ICD-10-CM

## 2012-08-13 DIAGNOSIS — H3530 Unspecified macular degeneration: Secondary | ICD-10-CM

## 2012-08-13 DIAGNOSIS — Z6825 Body mass index (BMI) 25.0-25.9, adult: Secondary | ICD-10-CM

## 2012-08-13 DIAGNOSIS — I2510 Atherosclerotic heart disease of native coronary artery without angina pectoris: Secondary | ICD-10-CM

## 2012-08-13 DIAGNOSIS — H269 Unspecified cataract: Secondary | ICD-10-CM

## 2012-08-13 DIAGNOSIS — E1159 Type 2 diabetes mellitus with other circulatory complications: Secondary | ICD-10-CM

## 2012-08-13 LAB — POCT GLYCOSYLATED HEMOGLOBIN (HGB A1C): Hemoglobin A1C: 7.5

## 2012-08-13 LAB — POCT UA - MICROALBUMIN
Creatinine, POC: 300 mg/dL
Microalbumin Ur, POC: 80 mg/dL

## 2012-08-13 NOTE — Progress Notes (Signed)
Subjective:    Patient ID: Clayton Harrison, male    DOB: 1930-10-10, 79 y.o.   MRN: [id number]  HPI DM- Says was on metformin for years and then changed it to [facility] and says didn't notice much difference except started having lows.  He back off on the glyburide to once a day but didn't stop it completely..  Am sugars 140-165.  Glyburide is no longer on formulary. He says he really wasn't sure why he was initially started on the Janumet. He does feel that the low blood sugar started after changing from metformin to Janumet. He does plan on joining silver sneakers. He got the paperwork to apply for the program at the [facility] in [place]. He has not started it yet. He currently does not get any regular exercise but used to be a very active person. He did have an up-to-date eye exam in June 2014 he was diagnosed with mild cataracts macular degeneration that was mild and saw Dr. Ashley Royalty for this. He has not had a wellness exam since 2001. He also is concerned because he has not had his urine checked since 2001. He thinks he is also due for repeat colonoscopy this fall in 2014.  HTN- Pt denies chest pain, SOB, dizziness, or heart palpitations.  Taking meds as directed w/o problems.  Denies medication side effects.    CAD-no chest pain or shortness of breath. Taking his medications including an aspirin daily. His last followup with cardiology was within the last 12 months.   Review of Systems  Constitutional: Negative for fever, diaphoresis and unexpected weight change.  HENT: Negative for hearing loss, rhinorrhea, sneezing and tinnitus.   Eyes: Negative for visual disturbance.  Respiratory: Negative for cough and wheezing.   Cardiovascular: Positive for palpitations. Negative for chest pain.  Gastrointestinal: Negative for nausea, vomiting, diarrhea and blood in stool.  Genitourinary: Negative for dysuria and discharge.  Musculoskeletal: Positive for back pain. Negative for myalgias and arthralgias.   Skin: Negative for rash.  Neurological: Negative for headaches.  Hematological: Negative for adenopathy.  Psychiatric/Behavioral: Negative for sleep disturbance and dysphoric mood. The patient is not nervous/anxious.      BP 138/68  Pulse 69  Ht 5\' 11"  (1.803 m)  Wt 215 lb (97.523 kg)  BMI 30 kg/m2    Allergies  Allergen Reactions  . Atorvastatin     REACTION: urinary frequency  . Ezetimibe     REACTION: "hip problems"  . Pravastatin Sodium     REACTION: myalgia  . Rosuvastatin   . Statins     Past Medical History  Diagnosis Date  . DM (diabetes mellitus)   . CAD (coronary artery disease)   . Vertigo   . HTN (hypertension)   . Other and unspecified hyperlipidemia   . Cerebrovascular disease, unspecified   . Elevated PSA   . Interstitial cystitis   . Statin intolerance   . Anemia, blood loss     history of  . PVD (peripheral vascular disease)     Past Surgical History  Procedure Laterality Date  . Kidney stone surgery  1972  . Heart bypass  2001    4  . Partial colectomy  2011    dr Dwain Sarna...low grade dysplasia  . Left carpal tunnel release    . Carotid endarterectomy  2012  . Coronary artery bypass graft      History   Social History  . Marital Status: Married    Spouse Name: N/A    Number  of Children: N/A  . Years of Education: N/A   Occupational History  . Not on file.   Social History Main Topics  . Smoking status: Former Smoker    Quit date: 06/25/1999  . Smokeless tobacco: Never Used  . Alcohol Use: No  . Drug Use: No  . Sexually Active: Not on file   Other Topics Concern  . Not on file   Social History Narrative   No regular exercise. Daily caffeine- 3-4 cups daily          Family History  Problem Relation Age of Onset  . Diabetes Mother   . Heart disease Mother   . Heart disease Father   . Colon cancer Maternal Uncle     x4    Outpatient Encounter Prescriptions as of 08/13/2012  Medication Sig Dispense Refill  .  amLODipine (NORVASC) 10 MG tablet TAKE 1 TABLET BY MOUTH DAILY.  30 tablet  5  . aspirin 325 MG tablet Take 325 mg by mouth daily.        Marland Kitchen JANUMET 50-1000 MG per tablet TAKE 1 TABLET BY MOUTH 2 TIMES A DAY  60 tablet  5  . lansoprazole (PREVACID) 15 MG capsule Take 15 mg by mouth daily.        Marland Kitchen lisinopril (PRINIVIL,ZESTRIL) 20 MG tablet TAKE 1 TABLET (20 MG TOTAL) BY MOUTH AT BEDTIME.  30 tablet  4  . meclizine (ANTIVERT) 25 MG tablet Take 0.5 tablets (12.5 mg total) by mouth 2 (two) times daily as needed. prn  120 tablet  1  . methocarbamol (ROBAXIN) 750 MG tablet 1-2 po qhs prn back pain and muscle spasm.  30 tablet  0  . metoprolol (LOPRESSOR) 50 MG tablet TAKE 1 TABLET (50 MG TOTAL) BY MOUTH 2 (TWO) TIMES DAILY.  60 tablet  5  . [DISCONTINUED] glyBURIDE (DIABETA) 5 MG tablet Take 1 tablet (5 mg total) by mouth 2 (two) times daily with a meal.  90 tablet  5  . [DISCONTINUED] glyBURIDE (DIABETA) 5 MG tablet TAKE 2 TABLETS BY MOUTH EVERY MORNING AND TAKE 1 TABLET BY MOUTH EVERY EVENING  90 tablet  3   No facility-administered encounter medications on file as of 08/13/2012.          Objective:   Physical Exam  Constitutional: He is oriented to person, place, and time. He appears well-developed and well-nourished.  HENT:  Head: Normocephalic and atraumatic.  Cardiovascular: Normal rate, regular rhythm and normal heart sounds.   No carotid or abdominal bruits. He does have a surgical scar from carotid endarterectomy on the right.  Pulmonary/Chest: Effort normal and breath sounds normal.  Musculoskeletal: He exhibits no edema.  Neurological: He is alert and oriented to person, place, and time.  Skin: Skin is warm and dry.  Psychiatric: He has a normal mood and affect. His behavior is normal.          Assessment & Plan:  DM- discussed options.  I think at this point we'll stop the glyburide. He is Re: reduce the dose himself but is no longer covered on his insurance plan. I think we  will stick with the Janumet. I reassured him that I think is lows will improve on the Janumet by itself. An opaque but this seems to really be on getting regular exercise. I think joining silver sneakers will be a fantastic start. Also continuing to work on diet. He admits he sleeps and eats things like ice cream fairly frequently. Also need  to work on weight loss. I think 15-20 pounds would be fantastic and would truly improve his blood sugars. We did do a urine microalbumin today. It was positive for protein. I explained to him that since he is already on an ACE inhibitor this is not necessarily need to be done routinely. Followup in 3 months. Explained to him that his goal is to get his hemoglobin A1c under 7.0.  HTN-  well controlled on repeat of blood pressure. Looks fantastic. Continue current regimen. Followup in 3 months.  Chewing tobacco - heat we discussed the need for cessation. He used to be a smoker had quit but now he is chewing tobacco.  CAD- Still take an ASA, statin, and betablocker. Continue current regimen. He's been asymptomatic.  I encouraged him to schedule an angle wellness exam. I think this would be fantastic and give [place] an opportunity to go over his preventative care with a fine tooth comb.   BMI 29- encouraged weight loss. I think silver sneakers as it replaced to start increasing his activity.

## 2012-08-17 ENCOUNTER — Encounter: Payer: Self-pay | Admitting: Neurosurgery

## 2012-08-18 ENCOUNTER — Other Ambulatory Visit (INDEPENDENT_AMBULATORY_CARE_PROVIDER_SITE_OTHER): Payer: Medicare Other | Admitting: *Deleted

## 2012-08-18 ENCOUNTER — Encounter: Payer: Self-pay | Admitting: Neurosurgery

## 2012-08-18 ENCOUNTER — Ambulatory Visit (INDEPENDENT_AMBULATORY_CARE_PROVIDER_SITE_OTHER): Payer: Medicare Other | Admitting: Neurosurgery

## 2012-08-18 ENCOUNTER — Other Ambulatory Visit: Payer: Self-pay | Admitting: *Deleted

## 2012-08-18 DIAGNOSIS — Z48812 Encounter for surgical aftercare following surgery on the circulatory system: Secondary | ICD-10-CM

## 2012-08-18 DIAGNOSIS — I6529 Occlusion and stenosis of unspecified carotid artery: Secondary | ICD-10-CM

## 2012-08-18 NOTE — Progress Notes (Signed)
[facility]  Carotid Office Note  CC: Carotid surveillance Referring Physician: Early  History of Present Illness: 79 year old male patient of Dr. Arbie Cookey status post right CEA in July 2012. The patient denies any signs or symptoms of CVA, TIA, amaurosis fugax or any neural deficit. The patient denies any new medical diagnoses or recent surgery.  Past Medical History  Diagnosis Date  . DM (diabetes mellitus)   . CAD (coronary artery disease)   . Vertigo   . HTN (hypertension)   . Other and unspecified hyperlipidemia   . Cerebrovascular disease, unspecified   . Elevated PSA   . Interstitial cystitis   . Statin intolerance   . Anemia, blood loss     history of  . PVD (peripheral vascular disease)   . Carotid artery occlusion     ROS: [x]  Positive   [ ]  Denies    General: [ ]  Weight loss, [ ]  Fever, [ ]  chills Neurologic: [ ]  Dizziness, [ ]  Blackouts, [ ]  Seizure [ ]  Stroke, [ ]  "Mini stroke", [ ]  Slurred speech, [ ]  Temporary blindness; [ ]  weakness in arms or legs, [ ]  Hoarseness Cardiac: [ ]  Chest pain/pressure, [ ]  Shortness of breath at rest [ ]  Shortness of breath with exertion, [ ]  Atrial fibrillation or irregular heartbeat Vascular: [ ]  Pain in legs with walking, [ ]  Pain in legs at rest, [ ]  Pain in legs at night,  [ ]  Non-healing ulcer, [ ]  Blood clot in vein/DVT,   Pulmonary: [ ]  Home oxygen, [ ]  Productive cough, [ ]  Coughing up blood, [ ]  Asthma,  [ ]  Wheezing Musculoskeletal:  [ ]  Arthritis, [ ]  Low back pain, [ ]  Joint pain Hematologic: [ ]  Easy Bruising, [ ]  Anemia; [ ]  Hepatitis Gastrointestinal: [ ]  Blood in stool, [ ]  Gastroesophageal Reflux/heartburn, [ ]  Trouble swallowing Urinary: [ ]  chronic Kidney disease, [ ]  on HD - [ ]  MWF or [ ]  TTHS, [ ]  Burning with urination, [ ]  Difficulty urinating Skin: [ ]  Rashes, [ ]  Wounds Psychological: [ ]  Anxiety, [ ]  Depression   Social History History  Substance Use Topics  . Smoking  status: Former Smoker    Quit date: 11/23/1999  . Smokeless tobacco: Current User    Types: Chew  . Alcohol Use: No    Family History Family History  Problem Relation Age of Onset  . Diabetes Mother   . Heart disease Mother   . Heart disease Father   . Colon cancer Maternal Uncle     x4    Allergies  Allergen Reactions  . Atorvastatin     REACTION: urinary frequency  . Ezetimibe     REACTION: "hip problems"  . Pravastatin Sodium     REACTION: myalgia  . Rosuvastatin   . Statins     Current Outpatient Prescriptions  Medication Sig Dispense Refill  . amLODipine (NORVASC) 10 MG tablet TAKE 1 TABLET BY MOUTH DAILY.  30 tablet  5  . aspirin 325 MG tablet Take 325 mg by mouth daily.        Marland Kitchen JANUMET 50-1000 MG per tablet TAKE 1 TABLET BY MOUTH 2 TIMES A DAY  60 tablet  5  . lansoprazole (PREVACID) 15 MG capsule Take 15 mg by mouth daily.        Marland Kitchen lisinopril (PRINIVIL,ZESTRIL) 20 MG tablet TAKE 1 TABLET (20 MG TOTAL) BY MOUTH AT BEDTIME.  30 tablet  4  . meclizine (ANTIVERT)  25 MG tablet Take 0.5 tablets (12.5 mg total) by mouth 2 (two) times daily as needed. prn  120 tablet  1  . metoprolol (LOPRESSOR) 50 MG tablet TAKE 1 TABLET (50 MG TOTAL) BY MOUTH 2 (TWO) TIMES DAILY.  60 tablet  5  . Multiple Vitamins-Minerals (PRESERVISION AREDS 2 PO) Take 2 tablets by mouth daily.      . methocarbamol (ROBAXIN) 750 MG tablet 1-2 po qhs prn back pain and muscle spasm.  30 tablet  0   No current facility-administered medications for this visit.    Physical Examination  Filed Vitals:   08/18/12 1351  BP: 145/80  Pulse: 63  Resp:     Body mass index is 30 kg/(m^2).  General:  WDWN in NAD Gait: Normal HEENT: WNL Eyes: Pupils equal Pulmonary: normal non-labored breathing , without Rales, rhonchi,  wheezing Cardiac: RRR, without  Murmurs, rubs or gallops; Abdomen: soft, NT, no masses Skin: no rashes, ulcers noted  Vascular Exam Pulses: 3+ radial pulses bilaterally Carotid  bruits: Carotid pulses to auscultation no bruits are heard Extremities without ischemic changes, no Gangrene , no cellulitis; no open wounds;  Musculoskeletal: no muscle wasting or atrophy   Neurologic: A&O X 3; Appropriate Affect ; SENSATION: normal; MOTOR FUNCTION:  moving all extremities equally. Speech is fluent/normal  Non-Invasive Vascular Imaging CAROTID DUPLEX 08/18/2012  Right ICA 0 - 19% stenosis Left ICA 20 - 39 % stenosis   ASSESSMENT/PLAN: Asymptomatic patient that will followup in one year with repeat carotid duplex. The patient's questions were encouraged and answered, he is in agreement with this plan.  Lauree Chandler ANP   Clinic MD: [facility]

## 2012-09-15 ENCOUNTER — Ambulatory Visit: Payer: Medicare Other | Admitting: Internal Medicine

## 2012-10-13 ENCOUNTER — Encounter: Payer: Medicare Other | Admitting: Family Medicine

## 2012-10-15 ENCOUNTER — Encounter: Payer: Self-pay | Admitting: Family Medicine

## 2012-10-15 ENCOUNTER — Ambulatory Visit (INDEPENDENT_AMBULATORY_CARE_PROVIDER_SITE_OTHER): Payer: Medicare Other | Admitting: Family Medicine

## 2012-10-15 VITALS — BP 135/68 | HR 72 | Wt 209.0 lb

## 2012-10-15 DIAGNOSIS — Z125 Encounter for screening for malignant neoplasm of prostate: Secondary | ICD-10-CM

## 2012-10-15 DIAGNOSIS — I798 Other disorders of arteries, arterioles and capillaries in diseases classified elsewhere: Secondary | ICD-10-CM

## 2012-10-15 DIAGNOSIS — E1151 Type 2 diabetes mellitus with diabetic peripheral angiopathy without gangrene: Secondary | ICD-10-CM

## 2012-10-15 DIAGNOSIS — Z0000 Encounter for general adult medical examination without abnormal findings: Secondary | ICD-10-CM

## 2012-10-15 DIAGNOSIS — I2510 Atherosclerotic heart disease of native coronary artery without angina pectoris: Secondary | ICD-10-CM

## 2012-10-15 DIAGNOSIS — E1159 Type 2 diabetes mellitus with other circulatory complications: Secondary | ICD-10-CM

## 2012-10-15 DIAGNOSIS — N400 Enlarged prostate without lower urinary tract symptoms: Secondary | ICD-10-CM | POA: Insufficient documentation

## 2012-10-15 LAB — LIPID PANEL
Cholesterol: 183 mg/dL (ref 0–200)
HDL: 43 mg/dL (ref >39)
LDL Cholesterol: 90 mg/dL (ref 0–99)
Total CHOL/HDL Ratio: 4.3 Ratio
Triglycerides: 250 mg/dL — ABNORMAL HIGH (ref <150)
VLDL: 50 mg/dL — ABNORMAL HIGH (ref 0–40)

## 2012-10-15 LAB — GLUCOSE, POCT (MANUAL RESULT ENTRY): POC Glucose: 222 mg/dl — AB (ref 70–99)

## 2012-10-15 MED ORDER — GLIPIZIDE ER 5 MG PO TB24: 5.0000 mg | ORAL_TABLET | Freq: Every day | ORAL | Status: DC

## 2012-10-15 MED ORDER — BACLOFEN 20 MG PO TABS: 20.0000 mg | ORAL_TABLET | Freq: Three times a day (TID) | ORAL | Status: DC

## 2012-10-15 MED ORDER — METFORMIN HCL 1000 MG PO TABS: 1000.0000 mg | ORAL_TABLET | Freq: Two times a day (BID) | ORAL | Status: DC

## 2012-10-15 NOTE — Progress Notes (Addendum)
Subjective:    Clayton Harrison is a 79 y.o. male who presents for Medicare Annual/Subsequent preventive examination.   Preventive Screening-Counseling & Management  Tobacco History  Smoking status  . Former Smoker  . Quit date: 11/23/1999  Smokeless tobacco  . Current User  . Types: Chew    Problems Prior to Visit 1. low back pain-see below.  Current Problems (verified) Patient Active Problem List  Diagnosis  . DM (diabetes mellitus) with peripheral vascular complication  . HYPERLIPIDEMIA  . ANEMIA-UNSPECIFIED  . HYPERTENSION  . CORONARY ARTERY DISEASE  . CEREBROVASCULAR DISEASE  . PERIPHERAL VASCULAR DISEASE  . GERD  . INTERSTITIAL CYSTITIS  . PSA, INCREASED  . Statin intolerance  . Occlusion and stenosis of carotid artery without mention of cerebral infarction  . Cataracts, bilateral  . Macular degeneration  . Overweight (BMI 25.0-29.9)    Medications Prior to Visit Current Outpatient Prescriptions on File Prior to Visit  Medication Sig Dispense Refill  . amLODipine (NORVASC) 10 MG tablet TAKE 1 TABLET BY MOUTH DAILY.  30 tablet  5  . aspirin 325 MG tablet Take 325 mg by mouth daily.        Marland Kitchen JANUMET 50-1000 MG per tablet TAKE 1 TABLET BY MOUTH 2 TIMES A DAY  60 tablet  5  . lansoprazole (PREVACID) 15 MG capsule Take 15 mg by mouth daily.        Marland Kitchen lisinopril (PRINIVIL,ZESTRIL) 20 MG tablet TAKE 1 TABLET (20 MG TOTAL) BY MOUTH AT BEDTIME.  30 tablet  4  . meclizine (ANTIVERT) 25 MG tablet Take 0.5 tablets (12.5 mg total) by mouth 2 (two) times daily as needed. prn  120 tablet  1  . methocarbamol (ROBAXIN) 750 MG tablet 1-2 po qhs prn back pain and muscle spasm.  30 tablet  0  . metoprolol (LOPRESSOR) 50 MG tablet TAKE 1 TABLET (50 MG TOTAL) BY MOUTH 2 (TWO) TIMES DAILY.  60 tablet  5  . Multiple Vitamins-Minerals (PRESERVISION AREDS 2 PO) Take 2 tablets by mouth daily.       No current facility-administered medications on file prior to visit.    Current  Medications (verified) Current Outpatient Prescriptions  Medication Sig Dispense Refill  . amLODipine (NORVASC) 10 MG tablet TAKE 1 TABLET BY MOUTH DAILY.  30 tablet  5  . aspirin 325 MG tablet Take 325 mg by mouth daily.        Marland Kitchen JANUMET 50-1000 MG per tablet TAKE 1 TABLET BY MOUTH 2 TIMES A DAY  60 tablet  5  . lansoprazole (PREVACID) 15 MG capsule Take 15 mg by mouth daily.        Marland Kitchen lisinopril (PRINIVIL,ZESTRIL) 20 MG tablet TAKE 1 TABLET (20 MG TOTAL) BY MOUTH AT BEDTIME.  30 tablet  4  . meclizine (ANTIVERT) 25 MG tablet Take 0.5 tablets (12.5 mg total) by mouth 2 (two) times daily as needed. prn  120 tablet  1  . methocarbamol (ROBAXIN) 750 MG tablet 1-2 po qhs prn back pain and muscle spasm.  30 tablet  0  . metoprolol (LOPRESSOR) 50 MG tablet TAKE 1 TABLET (50 MG TOTAL) BY MOUTH 2 (TWO) TIMES DAILY.  60 tablet  5  . Multiple Vitamins-Minerals (PRESERVISION AREDS 2 PO) Take 2 tablets by mouth daily.       No current facility-administered medications for this visit.     Allergies (verified) Atorvastatin; Ezetimibe; Pravastatin sodium; Rosuvastatin; and Statins   PAST HISTORY  Family History Family History  Problem Relation Age of Onset  . Diabetes Mother   . Heart disease Mother   . Heart disease Father   . Colon cancer Maternal Uncle     x4    Social History History  Substance Use Topics  . Smoking status: Former Smoker    Quit date: 11/23/1999  . Smokeless tobacco: Current User    Types: Chew  . Alcohol Use: No    Are there smokers in your home (other than you)?  No  Risk Factors Current exercise habits: mowing grass, nad working in the house  Dietary issues discussed: none   Cardiac risk factors: known heart disease.  Depression Screen (Note: if answer to either of the following is "Yes", a more complete depression screening is indicated)   Q1: Over the past two weeks, have you felt down, depressed or hopeless? No  Q2: Over the past two weeks, have you  felt little interest or pleasure in doing things? No  Have you lost interest or pleasure in daily life? No  Do you often feel hopeless? No  Do you cry easily over simple problems? No  Activities of Daily Living In your present state of health, do you have any difficulty performing the following activities?:  Driving? No Managing money?  No Feeding yourself? No Getting from bed to chair? No Climbing a flight of stairs? No Preparing food and eating?: No Bathing or showering? No Getting dressed: No Getting to the toilet? No Using the toilet:No Moving around from place to place: No In the past year have you fallen or had a near fall?:No   Are you sexually active?  No  Do you have more than one partner?  No  Hearing Difficulties: Yes Do you often ask people to speak up or repeat themselves? Yes Do you experience ringing or noises in your ears? No Do you have difficulty understanding soft or whispered voices? Yes   Do you feel that you have a problem with memory? No  Do you often misplace items? No  Do you feel safe at home?  Yes  Cognitive Testing  Alert? Yes  Normal Appearance?Yes  Oriented to person? Yes  Place? Yes   Time? Yes  Recall of three objects?  Yes  Can perform simple calculations? Yes  Displays appropriate judgment?Yes  Can read the correct time from a watch face?Yes   Advanced Directives have been discussed with the patient? Yes   List the Names of Other Physician/Practitioners you currently use: 1.  Dr. Gretta Began, Dr. Jens Som, Dr. Ashley Royalty.   Indicate any recent Medical Services you may have received from other than [facility] providers in the past year (date may be approximate).  Immunization History  Administered Date(s) Administered  . Influenza Whole 03/25/2011  . Pneumococcal Polysaccharide 06/24/2008  . Td 02/01/2009  . Zoster 05/01/2011    Screening Tests Health Maintenance  Topic Date Due  . Influenza Vaccine  02/22/2013  . Foot Exam   03/18/2013  . Ophthalmology Exam  06/24/2013  . Urine Microalbumin  08/13/2013  . Tetanus/tdap  02/02/2019  . Colonoscopy  03/26/2020  . Pneumococcal Polysaccharide Vaccine Age 16 And Over  Completed  . Zostavax  Completed    All answers were reviewed with the patient and necessary referrals were made:  Lindey Renzulli, MD   10/15/2012   History reviewed: allergies, current medications, past family history, past medical history, past social history, past surgical history and problem list  Review of Systems A comprehensive review of systems was  negative.    Objective:     Vision by Snellen chart: Had eye exam in January.  Blood pressure 135/68, pulse 72, weight 209 lb (94.802 kg). Body mass index is 29.16 kg/(m^2).  BP 135/68  Pulse 72  Wt 209 lb (94.802 kg)  BMI 29.16 kg/m2  General Appearance:    Alert, cooperative, no distress, appears stated age  Head:    Normocephalic, without obvious abnormality, atraumatic  Eyes:    PERRL, conjunctiva/corneas clear, EOM's intact, both eyes       Ears:    Normal TM's and external ear canals, both ears  Nose:   Nares normal, septum midline, mucosa normal, no drainage    or sinus tenderness  Throat:   Lips, mucosa, and tongue normal; teeth and gums normal  Neck:   Supple, symmetrical, trachea midline, no adenopathy;       thyroid:  No enlargement/tenderness/nodules; no carotid   bruit or JVD  Back:     Symmetric, no curvature, ROM normal, no CVA tenderness  Lungs:     Clear to auscultation bilaterally, respirations unlabored  Chest wall:    No tenderness or deformity  Heart:    Regular rate and rhythm, S1 and S2 normal, no murmur, rub   or gallop  Abdomen:     Soft, non-tender, bowel sounds active all four quadrants,    no masses, no organomegaly  Genitalia:    Not performed.   Rectal:    Normal tone, 2+ enlarged prostate, no masses or tenderness;   guaiac negative stool  Extremities:   Extremities normal, atraumatic, no cyanosis  or edema  Pulses:   2+ and symmetric all extremities  Skin:   Skin color, texture, turgor normal, no rashes or lesions  Lymph nodes:   Cervical, supraclavicular, and axillary nodes normal  Neurologic:   CNII-XII intact. Normal strength, sensation and reflexes      throughout       Assessment:     Annual WEllness Exam.       Plan:     During the course of the visit the patient was educated and counseled about appropriate screening and preventive services including:    vaccines are UTD  Colonoscopy is UTD  He does report hearing problems but not interested in referral at this time for hearing aids.  He has an appointment Dr. Jens Som coming up so I will let them do his EKG there consider doing it today.  Diabetes-it sounds like his sugars have been poorly controlled over the last few weeks after stopping glipizide. I would like to stop the Janumet and it back to just plain metformin and a lower dose of glipizide and then keep his followup appointment for diabetes in about 2 months. We'll need to monitor very carefully for hypoglycemic episodes on the glipizide. Initially he said that insurance would no longer cover glipizide but it looks like per our formulary that it is covered. Glyburide is not covered.  Low back pain-he's complaining of low back pain it's been bothering him for the last couple weeks. He had another episode about 8-9 months ago and says he responded really well to muscle relaxer. He would like a refill on that today. He's currently been using a [facility] brand of extra strength Tylenol and says he does get some mild relief with that. He denies any trauma or injury. No fever, or numbness or weakness in his studies. It is low back pain is not improving in the next 2-3 weeks  he is to make a followup appointment.  BPH- on exam today. Will check PSA. He feels his sxs are minimal and not really affecting his daily life.     Diet review for nutrition referral? Yes ____  Not  Indicated __x_   Patient Instructions (the written plan) was given to the patient.  Medicare Attestation I have personally reviewed: The patient's medical and social history Their use of alcohol, tobacco or illicit drugs Their current medications and supplements The patient's functional ability including ADLs,fall risks, home safety risks, cognitive, and hearing and visual impairment Diet and physical activities Evidence for depression or mood disorders  The patient's weight, height, BMI, and visual acuity have been recorded in the chart.  I have made referrals, counseling, and provided education to the patient based on review of the above and I have provided the patient with a written personalized care plan for preventive services.     Reeve Mallo, MD   10/15/2012

## 2012-10-15 NOTE — Patient Instructions (Addendum)
You can stop the Janumet. I will change to metformin separately and add glipizide separately. These look like they're covered very well with your insurance plan and should not be expensive. Keep your regular followup for your diabetes

## 2012-10-16 LAB — COMPLETE METABOLIC PANEL WITH GFR
ALT: 13 U/L (ref 0–53)
AST: 14 U/L (ref 0–37)
Albumin: 4.5 g/dL (ref 3.5–5.2)
Alkaline Phosphatase: 64 U/L (ref 39–117)
BUN: 12 mg/dL (ref 6–23)
CO2: 28 mEq/L (ref 19–32)
Calcium: 9.5 mg/dL (ref 8.4–10.5)
Chloride: 99 mEq/L (ref 96–112)
Creat: 1.02 mg/dL (ref 0.50–1.35)
GFR, Est African American: 80 mL/min
GFR, Est Non African American: 70 mL/min
Glucose, Bld: 194 mg/dL — ABNORMAL HIGH (ref 70–99)
Potassium: 4.1 mEq/L (ref 3.5–5.3)
Sodium: 138 mEq/L (ref 135–145)
Total Bilirubin: 0.6 mg/dL (ref 0.3–1.2)
Total Protein: 7.5 g/dL (ref 6.0–8.3)

## 2012-10-16 LAB — PSA: PSA: 8.3 ng/mL — ABNORMAL HIGH (ref <=4.00)

## 2012-10-19 ENCOUNTER — Other Ambulatory Visit: Payer: Self-pay | Admitting: Family Medicine

## 2012-10-19 DIAGNOSIS — R972 Elevated prostate specific antigen [PSA]: Secondary | ICD-10-CM

## 2012-10-26 ENCOUNTER — Other Ambulatory Visit: Payer: Self-pay | Admitting: Internal Medicine

## 2012-10-27 ENCOUNTER — Ambulatory Visit (INDEPENDENT_AMBULATORY_CARE_PROVIDER_SITE_OTHER): Payer: Medicare Other | Admitting: Cardiology

## 2012-10-27 ENCOUNTER — Encounter: Payer: Self-pay | Admitting: Cardiology

## 2012-10-27 VITALS — BP 180/80 | HR 73 | Ht 71.0 in | Wt 207.0 lb

## 2012-10-27 DIAGNOSIS — E785 Hyperlipidemia, unspecified: Secondary | ICD-10-CM

## 2012-10-27 DIAGNOSIS — I2510 Atherosclerotic heart disease of native coronary artery without angina pectoris: Secondary | ICD-10-CM

## 2012-10-27 DIAGNOSIS — I679 Cerebrovascular disease, unspecified: Secondary | ICD-10-CM

## 2012-10-27 DIAGNOSIS — I739 Peripheral vascular disease, unspecified: Secondary | ICD-10-CM

## 2012-10-27 DIAGNOSIS — I10 Essential (primary) hypertension: Secondary | ICD-10-CM

## 2012-10-27 DIAGNOSIS — R002 Palpitations: Secondary | ICD-10-CM

## 2012-10-27 NOTE — Assessment & Plan Note (Signed)
Continue aspirin. Intolerant to statins. Schedule Myoview. 

## 2012-10-27 NOTE — Assessment & Plan Note (Signed)
We will follow for now. I will consider a monitor if his symptoms worsen.

## 2012-10-27 NOTE — Assessment & Plan Note (Signed)
Continue diet.

## 2012-10-27 NOTE — Assessment & Plan Note (Signed)
Continue aspirin.intolerant to statins. Followed by vascular surgery.

## 2012-10-27 NOTE — Patient Instructions (Addendum)
Your physician wants you to follow-up in: ONE YEAR WITH DR CRENSHAW You will receive a reminder letter in the mail two months in advance. If you don't receive a letter, please call our office to schedule the follow-up appointment.   Your physician has requested that you have a lexiscan myoview. For further information please visit [email or web address]. Please follow instruction sheet, as given.   

## 2012-10-27 NOTE — Assessment & Plan Note (Signed)
Blood pressure elevated. However he follows this closely at home. It typically runs 140/80. We will increase in the future as needed.

## 2012-10-27 NOTE — Assessment & Plan Note (Signed)
Continue aspirin. Followed by vascular surgery. 

## 2012-10-27 NOTE — Progress Notes (Signed)
HPI: [name] is a very pleasant gentleman who has a history of coronary artery disease, status post coronary artery bypass graft in June 2001. Last Myoview in November of 2011 revealed EF of 61% and fixed apical perfusion defect representing prior MI versus attenuation. Fixed basal anteroseptal perfusion defect is likely an artifact due to "short septum". Abdominal ultrasound in June of 2006 showed no aneurysm. I last saw him in May 2013. Since then, the patient has dyspnea with more extreme activities but not with routine activities. It is relieved with rest. It is not associated with chest pain. There is no orthopnea, PND or pedal edema. There is no syncope. There is no exertional chest pain. He has noticed occasional brief palpitations upon awakening but not sustained. They're described as a brief flutter.    Current Outpatient Prescriptions  Medication Sig Dispense Refill  . amLODipine (NORVASC) 10 MG tablet TAKE 1 TABLET BY MOUTH DAILY.  30 tablet  5  . aspirin 325 MG tablet Take 325 mg by mouth daily.        . baclofen (LIORESAL) 20 MG tablet Take 1 tablet (20 mg total) by mouth 3 (three) times daily.  30 each  2  . glipiZIDE (GLUCOTROL XL) 5 MG 24 hr tablet Take 1 tablet (5 mg total) by mouth daily.  30 tablet  6  . JANUMET 50-1000 MG per tablet Take one tab twice daily with a meal      . lansoprazole (PREVACID) 15 MG capsule Take 15 mg by mouth daily.        Marland Kitchen lisinopril (PRINIVIL,ZESTRIL) 20 MG tablet TAKE 1 TABLET (20 MG TOTAL) BY MOUTH AT BEDTIME.  30 tablet  4  . meclizine (ANTIVERT) 25 MG tablet Take 0.5 tablets (12.5 mg total) by mouth 2 (two) times daily as needed. prn  120 tablet  1  . metFORMIN (GLUCOPHAGE) 1000 MG tablet Take 1 tablet (1,000 mg total) by mouth 2 (two) times daily with a meal.  60 tablet  6  . metoprolol (LOPRESSOR) 50 MG tablet TAKE 1 TABLET (50 MG TOTAL) BY MOUTH 2 (TWO) TIMES DAILY.  60 tablet  5  . Multiple Vitamins-Minerals (PRESERVISION AREDS 2 PO) Take 2  tablets by mouth daily.       No current facility-administered medications for this visit.     Past Medical History  Diagnosis Date  . DM (diabetes mellitus)   . CAD (coronary artery disease)   . Vertigo   . HTN (hypertension)   . Other and unspecified hyperlipidemia   . Cerebrovascular disease, unspecified   . Elevated PSA   . Interstitial cystitis   . Statin intolerance   . Anemia, blood loss     history of  . PVD (peripheral vascular disease)   . Carotid artery occlusion     Past Surgical History  Procedure Laterality Date  . Kidney stone surgery  1972  . Coronary artery bypass graft  2001    4  . Partial colectomy  2011    dr Dwain Sarna...low grade dysplasia  . Left carpal tunnel release  2009  . Carotid endarterectomy  2012    right  . Tonsillectomy  1940  . Colon surgery  2011    History   Social History  . Marital Status: Married    Spouse Name: Clayton Harrison    Number of Children: 2  . Years of Education: HS   Occupational History  . Retired    Social History Main Topics  .  Smoking status: Former Smoker    Quit date: 11/23/1999  . Smokeless tobacco: Current User    Types: Chew  . Alcohol Use: No  . Drug Use: No  . Sexually Active: Yes -- Male partner(s)   Other Topics Concern  . Not on file   Social History Narrative   No regular exercise. Daily caffeine- 3-4 cups daily          ROS: no fevers or chills, productive cough, hemoptysis, dysphasia, odynophagia, melena, hematochezia, dysuria, hematuria, rash, seizure activity, orthopnea, PND, pedal edema, claudication. Remaining systems are negative.  Physical Exam: Well-developed well-nourished in no acute distress.  Skin is warm and dry.  HEENT is normal.  Neck is supple.  Chest is clear to auscultation with normal expansion.  Cardiovascular exam is regular rate and rhythm.  Abdominal exam nontender or distended. No masses palpated. Extremities show no edema. neuro grossly intact  ECG with  occasional PACs. Right bundle branch block. Prior inferior infarct. Lateral T-wave inversion.

## 2012-10-31 ENCOUNTER — Encounter: Payer: Self-pay | Admitting: Family Medicine

## 2012-11-02 ENCOUNTER — Other Ambulatory Visit: Payer: Self-pay | Admitting: *Deleted

## 2012-11-02 MED ORDER — LISINOPRIL 20 MG PO TABS: ORAL_TABLET | ORAL | Status: DC

## 2012-11-04 ENCOUNTER — Ambulatory Visit (HOSPITAL_COMMUNITY): Payer: Medicare Other | Attending: Cardiology | Admitting: Radiology

## 2012-11-04 ENCOUNTER — Encounter (HOSPITAL_COMMUNITY): Payer: Medicare Other

## 2012-11-04 VITALS — BP 151/74 | Ht 71.0 in | Wt 204.0 lb

## 2012-11-04 DIAGNOSIS — E119 Type 2 diabetes mellitus without complications: Secondary | ICD-10-CM | POA: Insufficient documentation

## 2012-11-04 DIAGNOSIS — E785 Hyperlipidemia, unspecified: Secondary | ICD-10-CM | POA: Insufficient documentation

## 2012-11-04 DIAGNOSIS — Z951 Presence of aortocoronary bypass graft: Secondary | ICD-10-CM | POA: Insufficient documentation

## 2012-11-04 DIAGNOSIS — Z87891 Personal history of nicotine dependence: Secondary | ICD-10-CM | POA: Insufficient documentation

## 2012-11-04 DIAGNOSIS — R5381 Other malaise: Secondary | ICD-10-CM | POA: Insufficient documentation

## 2012-11-04 DIAGNOSIS — I4510 Unspecified right bundle-branch block: Secondary | ICD-10-CM

## 2012-11-04 DIAGNOSIS — R42 Dizziness and giddiness: Secondary | ICD-10-CM | POA: Insufficient documentation

## 2012-11-04 DIAGNOSIS — R0989 Other specified symptoms and signs involving the circulatory and respiratory systems: Secondary | ICD-10-CM | POA: Insufficient documentation

## 2012-11-04 DIAGNOSIS — I10 Essential (primary) hypertension: Secondary | ICD-10-CM | POA: Insufficient documentation

## 2012-11-04 DIAGNOSIS — I739 Peripheral vascular disease, unspecified: Secondary | ICD-10-CM | POA: Insufficient documentation

## 2012-11-04 DIAGNOSIS — I2510 Atherosclerotic heart disease of native coronary artery without angina pectoris: Secondary | ICD-10-CM

## 2012-11-04 DIAGNOSIS — R0602 Shortness of breath: Secondary | ICD-10-CM

## 2012-11-04 DIAGNOSIS — R002 Palpitations: Secondary | ICD-10-CM | POA: Insufficient documentation

## 2012-11-04 DIAGNOSIS — Z8673 Personal history of transient ischemic attack (TIA), and cerebral infarction without residual deficits: Secondary | ICD-10-CM | POA: Insufficient documentation

## 2012-11-04 DIAGNOSIS — R0609 Other forms of dyspnea: Secondary | ICD-10-CM | POA: Insufficient documentation

## 2012-11-04 MED ORDER — TECHNETIUM TC 99M SESTAMIBI GENERIC - CARDIOLITE
33.0000 | Freq: Once | INTRAVENOUS | Status: AC | PRN
  Administered 2012-11-04: 33 via INTRAVENOUS

## 2012-11-04 MED ORDER — REGADENOSON 0.4 MG/5ML IV SOLN
0.4000 mg | Freq: Once | INTRAVENOUS | Status: AC
  Administered 2012-11-04: 0.4 mg via INTRAVENOUS

## 2012-11-04 MED ORDER — TECHNETIUM TC 99M SESTAMIBI GENERIC - CARDIOLITE
11.0000 | Freq: Once | INTRAVENOUS | Status: AC | PRN
  Administered 2012-11-04: 11 via INTRAVENOUS

## 2012-11-04 NOTE — Progress Notes (Signed)
[facility] SITE 3 NUCLEAR MED [address] [place], [place] [address] [phone]    Cardiology Nuclear Med Study  Clayton Harrison is a 79 y.o. male     MRN : [id number]     DOB: 1931-06-01  Procedure Date: 11/04/2012  Nuclear Med Background Indication for Stress Test:  Evaluation for Ischemia and Graft Patency History:  2001 CABG 2011 MPS EF 61% no ischemia Cardiac Risk Factors: Carotid Disease, CVA, History of Smoking, Hypertension, Lipids, NIDDM and PVD  Symptoms:  Dizziness, DOE, Fatigue, Light-Headedness and Palpitations   Nuclear Pre-Procedure Caffeine/Decaff Intake:  None > 12 hrs NPO After: 8:00pm   Lungs:  clear O2 Sat: 95% on room air. IV 0.9% NS with Angio Cath:  20g  IV Site: R Antecubital x 1, tolerated well IV Started by:  Irean Hong, RN  Chest Size (in):  46 Cup Size: n/a  Height: 5\' 11"  (1.803 m)  Weight:  204 lb (92.534 kg)  BMI:  Body mass index is 28.46 kg/(m^2). Tech Comments:  Doctor, general practice and Norvasc this am; held diabetic po medications today    Nuclear Med Study 1 or 2 day study: 1 day  Stress Test Type:  Lexiscan  Reading MD: Charlton Haws, MD  Order Authorizing Provider:  Olga Millers, MD  Resting Radionuclide: Technetium 47m Sestamibi  Resting Radionuclide Dose: 11.0 mCi   Stress Radionuclide:  Technetium 21m Sestamibi  Stress Radionuclide Dose: 33.0 mCi           Stress Protocol Rest HR: 62 Stress HR: 83  Rest BP: 151/74 Stress BP: 106/74  Exercise Time (min): 2:00 METS: 1.6   Predicted Max HR: 141 bpm % Max HR: 62.41 bpm Rate Pressure Product: 21308   Dose of Adenosine (mg):  n/a Dose of Lexiscan: 0.4 mg  Dose of Atropine (mg): n/a Dose of Dobutamine: n/a mcg/kg/min (at max HR)  Stress Test Technologist: Bonnita Levan, RN  Nuclear Technologist:  Domenic Polite, CNMT     Rest Procedure:  Myocardial perfusion imaging was performed at rest 45 minutes following the intravenous administration of Technetium 27m  Sestamibi. Rest ECG: SR RBBB lateral T wave inversions  Stress Procedure:  The patient received IV Lexiscan 0.4 mg over 15-seconds with concurrent low level exercise and then Technetium 62m Sestamibi was injected at 30-seconds while the patient continued walking one more minute.  Quantitative spect images were obtained after a 45-minute delay. Stress ECG: No significant change from baseline ECG  QPS Raw Data Images:  Normal; no motion artifact; normal heart/lung ratio. Stress Images:  Normal homogeneous uptake in all areas of the myocardium. Rest Images:  Normal homogeneous uptake in all areas of the myocardium. Subtraction (SDS):  SDS 3 abnormal at apex Transient Ischemic Dilatation (Normal <1.22):  1.15 Lung/Heart Ratio (Normal <0.45):  0.42  Quantitative Gated Spect Images QGS EDV:  121 ml QGS ESV:  57 ml  Impression Exercise Capacity:  Lexiscan with low level exercise. BP Response:  Normal blood pressure response. Clinical Symptoms:  No significant symptoms noted. ECG Impression:  No significant ST segment change suggestive of ischemia. Comparison with Prior Nuclear Study: No images to compare  Overall Impression:  Normal stress nuclear study. Thinning of apex not thought to be signficant  LV Ejection Fraction: 53%.  LV Wall Motion:  NL LV Function; NL Wall Motion EF normal but surface images suggest mild apical hypokinesis   Charlton Haws

## 2012-11-05 ENCOUNTER — Ambulatory Visit: Payer: Medicare Other | Admitting: Family Medicine

## 2012-11-06 ENCOUNTER — Telehealth: Payer: Self-pay | Admitting: Cardiology

## 2012-11-06 NOTE — Telephone Encounter (Signed)
New Problem:    Patient called in returning your call. Please call back. 

## 2012-11-06 NOTE — Telephone Encounter (Signed)
Spoke with pt wife, aware of nuclear results. 

## 2012-11-11 ENCOUNTER — Encounter: Payer: Self-pay | Admitting: Family Medicine

## 2012-11-11 ENCOUNTER — Ambulatory Visit (INDEPENDENT_AMBULATORY_CARE_PROVIDER_SITE_OTHER): Payer: Medicare Other | Admitting: Family Medicine

## 2012-11-11 VITALS — BP 127/75 | HR 67 | Wt 208.0 lb

## 2012-11-11 DIAGNOSIS — E1151 Type 2 diabetes mellitus with diabetic peripheral angiopathy without gangrene: Secondary | ICD-10-CM

## 2012-11-11 DIAGNOSIS — E1159 Type 2 diabetes mellitus with other circulatory complications: Secondary | ICD-10-CM

## 2012-11-11 DIAGNOSIS — I798 Other disorders of arteries, arterioles and capillaries in diseases classified elsewhere: Secondary | ICD-10-CM

## 2012-11-11 DIAGNOSIS — E785 Hyperlipidemia, unspecified: Secondary | ICD-10-CM

## 2012-11-11 DIAGNOSIS — I10 Essential (primary) hypertension: Secondary | ICD-10-CM

## 2012-11-11 LAB — POCT GLYCOSYLATED HEMOGLOBIN (HGB A1C): Hemoglobin A1C: 8.6

## 2012-11-11 NOTE — Progress Notes (Signed)
  Subjective:    Patient ID: Clayton Harrison, male    DOB: 12-17-1930, 79 y.o.   MRN: [id number]  HPI DM- Sugars running 150 at home fasting in the AM.  No major dietary changes.  Eating ice cream at night. Has been drinking a lot of soda. Says water is for "swimming".  No regular exercise.  Hasn't been active in the yard.    CAD  - Recently had normal stress test.  He was excited about this.  Hyperlipidemia - unfortunately has been intolerant to statins.  Hypertension-no chest pain or shortness of breath. Tolerating medication regimen well without any side effects. No recent changes.  Review of Systems     Objective:   Physical Exam  Constitutional: He is oriented to person, place, and time. He appears well-developed and well-nourished.  HENT:  Head: Normocephalic and atraumatic.  Cardiovascular: Normal rate, regular rhythm and normal heart sounds.   Pulmonary/Chest: Effort normal and breath sounds normal.  Neurological: He is alert and oriented to person, place, and time.  Skin: Skin is warm and dry.  Psychiatric: He has a normal mood and affect. His behavior is normal.          Assessment & Plan:  DM- Uncontrolled. discussed stopping ice cream and soda which he is eating daily.   Work on increased activity level.  He is unable to take a statin. He does take full dose aspirin daily. He is also on an ACE inhibitor. I would like to see him back in 6 weeks to make sure that he is getting his sugars under control before his repeat hemoglobin A1c in 3 months. He is agreeable to try to make some dietary changes and encourage more regular activity. Kidney function is stable with a GFR over 60 so we can continue the metformin for now. Lab Results  Component Value Date   HGBA1C 8.6 11/11/2012   Hyperlipidemia - unable to tolerate statins.  Hypertension-well-controlled. Continue current regimen. Again work on diet and exercise. He is very sedentary.

## 2012-12-23 ENCOUNTER — Ambulatory Visit (INDEPENDENT_AMBULATORY_CARE_PROVIDER_SITE_OTHER): Payer: Medicare Other | Admitting: Family Medicine

## 2012-12-23 ENCOUNTER — Encounter: Payer: Self-pay | Admitting: Family Medicine

## 2012-12-23 VITALS — BP 122/70 | HR 67 | Wt 200.0 lb

## 2012-12-23 DIAGNOSIS — I798 Other disorders of arteries, arterioles and capillaries in diseases classified elsewhere: Secondary | ICD-10-CM

## 2012-12-23 DIAGNOSIS — I10 Essential (primary) hypertension: Secondary | ICD-10-CM

## 2012-12-23 DIAGNOSIS — E1151 Type 2 diabetes mellitus with diabetic peripheral angiopathy without gangrene: Secondary | ICD-10-CM

## 2012-12-23 DIAGNOSIS — E1159 Type 2 diabetes mellitus with other circulatory complications: Secondary | ICD-10-CM

## 2012-12-23 LAB — POCT GLYCOSYLATED HEMOGLOBIN (HGB A1C): Hemoglobin A1C: 6.9

## 2012-12-23 MED ORDER — LISINOPRIL 20 MG PO TABS: ORAL_TABLET | ORAL | Status: DC

## 2012-12-23 MED ORDER — METOPROLOL TARTRATE 50 MG PO TABS: ORAL_TABLET | ORAL | Status: DC

## 2012-12-23 NOTE — Progress Notes (Signed)
  Subjective:    Patient ID: ROJELIO UHRICH, male    DOB: September 21, 1930, 79 y.o.   MRN: [id number]  HPI DM- well controlled.  Sugars running around 120.  He has lost 8 lbs.  He cut out his soda.  Cut out sugar in his coffee. Stopped his ice cream.   HTN- Pt denies chest pain, SOB, dizziness, or heart palpitations.  Taking meds as directed w/o problems.  Denies medication side effects.     Review of Systems     Objective:   Physical Exam  Constitutional: He is oriented to person, place, and time. He appears well-developed and well-nourished.  HENT:  Head: Normocephalic and atraumatic.  Cardiovascular: Normal rate, regular rhythm and normal heart sounds.   Pulmonary/Chest: Effort normal and breath sounds normal.  Neurological: He is alert and oriented to person, place, and time.  Skin: Skin is warm and dry.  Psychiatric: He has a normal mood and affect. His behavior is normal.          Assessment & Plan:  DM - well controlled. He is doing so much better now has made some dietary changes. F/u in 3 mo. Eye and foot exam are UTD.    HTn - well controlled.

## 2013-03-15 ENCOUNTER — Telehealth: Payer: Self-pay | Admitting: *Deleted

## 2013-03-15 NOTE — Telephone Encounter (Signed)
Wife is asking what you suggest for pt to take for the cold he has had x 1 week since he is diabetic.  Meyer Cory, LPN

## 2013-03-15 NOTE — Telephone Encounter (Signed)
He can certainly use plain Mucinex if he is having a lot of sputum production. He can use Tylenol or Advil for fever relief and aches and pains. There is actually some good studies looking at the proximal and helping with cough. There are some cough medications like corcidan that won't drive up blood pressure. If he is not improving in the next couple of days then I recommend that he come in to be examined. Or, if he is having a fever or temperature he should come in sooner.

## 2013-03-16 NOTE — Telephone Encounter (Signed)
Pt informed.  Misty Ahmad, LPN  

## 2013-03-25 ENCOUNTER — Ambulatory Visit (INDEPENDENT_AMBULATORY_CARE_PROVIDER_SITE_OTHER): Payer: Medicare Other | Admitting: Family Medicine

## 2013-03-25 ENCOUNTER — Encounter: Payer: Self-pay | Admitting: Family Medicine

## 2013-03-25 VITALS — BP 131/75 | HR 67 | Wt 194.0 lb

## 2013-03-25 DIAGNOSIS — I10 Essential (primary) hypertension: Secondary | ICD-10-CM

## 2013-03-25 DIAGNOSIS — E1151 Type 2 diabetes mellitus with diabetic peripheral angiopathy without gangrene: Secondary | ICD-10-CM

## 2013-03-25 DIAGNOSIS — E119 Type 2 diabetes mellitus without complications: Secondary | ICD-10-CM

## 2013-03-25 LAB — POCT GLYCOSYLATED HEMOGLOBIN (HGB A1C): Hemoglobin A1C: 6.2

## 2013-03-25 NOTE — Progress Notes (Signed)
  Subjective:    Patient ID: Clayton Harrison, male    DOB: 03/26/1931, 79 y.o.   MRN: [id number]  HPI DM- No hypoglycemic events.  No major changes with his diet.  Has cut back on carbs. No wounds that aren't healing well. Ate potato soup and sugar was 220.   HTN-  Pt denies chest pain, SOB, dizziness, or heart palpitations.  Taking meds as directed w/o problems.  Denies medication side effects.    Review of Systems     Objective:   Physical Exam  Constitutional: He is oriented to person, place, and time. He appears well-developed and well-nourished.  HENT:  Head: Normocephalic and atraumatic.  Cardiovascular: Normal rate, regular rhythm and normal heart sounds.   Pulmonary/Chest: Effort normal and breath sounds normal.  Neurological: He is alert and oriented to person, place, and time.  Skin: Skin is warm and dry.  Psychiatric: He has a normal mood and affect. His behavior is normal.          Assessment & Plan:  DM- Well controlled.  On ACE, ASA. Intolerant to statins. Did foot exam today. F/U in 3 months.  Will get flu shot later this month.   HTN- well controlled.

## 2013-03-27 ENCOUNTER — Encounter: Payer: Self-pay | Admitting: Family Medicine

## 2013-03-29 ENCOUNTER — Other Ambulatory Visit: Payer: Self-pay | Admitting: *Deleted

## 2013-03-29 MED ORDER — LISINOPRIL 20 MG PO TABS: ORAL_TABLET | ORAL | Status: DC

## 2013-03-29 NOTE — Telephone Encounter (Signed)
rx sent

## 2013-04-15 ENCOUNTER — Ambulatory Visit (INDEPENDENT_AMBULATORY_CARE_PROVIDER_SITE_OTHER): Payer: Medicare Other | Admitting: Family Medicine

## 2013-04-15 VITALS — BP 129/63 | HR 60 | Wt 193.0 lb

## 2013-04-15 DIAGNOSIS — Z23 Encounter for immunization: Secondary | ICD-10-CM

## 2013-04-15 NOTE — Progress Notes (Signed)
Injection tolerated well.Clayton Harrison

## 2013-04-26 ENCOUNTER — Other Ambulatory Visit: Payer: Self-pay

## 2013-04-26 ENCOUNTER — Encounter: Payer: Self-pay | Admitting: Family Medicine

## 2013-04-26 MED ORDER — AMLODIPINE BESYLATE 10 MG PO TABS: ORAL_TABLET | ORAL | Status: DC

## 2013-05-10 ENCOUNTER — Other Ambulatory Visit: Payer: Self-pay | Admitting: Family Medicine

## 2013-06-28 ENCOUNTER — Other Ambulatory Visit: Payer: Self-pay | Admitting: Family Medicine

## 2013-07-22 ENCOUNTER — Ambulatory Visit (INDEPENDENT_AMBULATORY_CARE_PROVIDER_SITE_OTHER): Payer: Medicare Other | Admitting: Family Medicine

## 2013-07-22 ENCOUNTER — Encounter: Payer: Self-pay | Admitting: Family Medicine

## 2013-07-22 VITALS — BP 138/63 | HR 63 | Temp 97.6°F | Ht 71.0 in | Wt 194.0 lb

## 2013-07-22 DIAGNOSIS — E1159 Type 2 diabetes mellitus with other circulatory complications: Secondary | ICD-10-CM

## 2013-07-22 DIAGNOSIS — I10 Essential (primary) hypertension: Secondary | ICD-10-CM

## 2013-07-22 DIAGNOSIS — E1151 Type 2 diabetes mellitus with diabetic peripheral angiopathy without gangrene: Secondary | ICD-10-CM

## 2013-07-22 LAB — POCT UA - MICROALBUMIN
Creatinine, POC: 300 mg/dL
Microalbumin Ur, POC: 80 mg/L

## 2013-07-22 LAB — POCT GLYCOSYLATED HEMOGLOBIN (HGB A1C): Hemoglobin A1C: 6.2

## 2013-07-22 NOTE — Addendum Note (Signed)
Addended by: Teddy Spike on: 07/22/2013 04:24 PM   Modules accepted: Orders

## 2013-07-22 NOTE — Progress Notes (Signed)
   Subjective:    Patient ID: Clayton Harrison, male    DOB: 1930-07-28, 80 y.o.   MRN: [id number]  HPI Diabetes - no hypoglycemic events. No wounds or sores that are not healing well. No increased thirst or urination. Checking glucose at home. Taking medications as prescribed without any side effects.  Hypertension- Pt denies chest pain, SOB, dizziness, or heart palpitations.  Taking meds as directed w/o problems.  Denies medication side effects.    Review of Systems     Objective:   Physical Exam  Constitutional: He is oriented to person, place, and time. He appears well-developed and well-nourished.  HENT:  Head: Normocephalic and atraumatic.  Neck: Neck supple. No thyromegaly present.  Cardiovascular: Normal rate, regular rhythm and normal heart sounds.   No carotid bruit on the left.  Pulmonary/Chest: Effort normal and breath sounds normal.  Lymphadenopathy:    He has no cervical adenopathy.  Neurological: He is alert and oriented to person, place, and time.  Skin: Skin is warm and dry.  Psychiatric: He has a normal mood and affect. His behavior is normal.          Assessment & Plan:  DM - Well controlled.  Will try for urine microalbumin sample today. Followup in 3 months. He is on ACE inhibitor and aspirin. Intolerant to statins. Lab Results  Component Value Date   HGBA1C 6.2 07/22/2013   HTN- well controlled. Using mupirocin. Followup in 3 months.  Note, he did mention that he does have a followup ultrasound on his left carotid schedule for next month. He is Re: had a carotid endarterectomy performed on the right and they're monitoring his left. Unfortunately he is intolerant to statins.

## 2013-07-23 ENCOUNTER — Ambulatory Visit (INDEPENDENT_AMBULATORY_CARE_PROVIDER_SITE_OTHER): Payer: Medicare Other | Admitting: Ophthalmology

## 2013-07-23 DIAGNOSIS — H43819 Vitreous degeneration, unspecified eye: Secondary | ICD-10-CM

## 2013-07-23 DIAGNOSIS — H2510 Age-related nuclear cataract, unspecified eye: Secondary | ICD-10-CM

## 2013-07-23 DIAGNOSIS — I10 Essential (primary) hypertension: Secondary | ICD-10-CM

## 2013-07-23 DIAGNOSIS — E1139 Type 2 diabetes mellitus with other diabetic ophthalmic complication: Secondary | ICD-10-CM

## 2013-07-23 DIAGNOSIS — E1165 Type 2 diabetes mellitus with hyperglycemia: Secondary | ICD-10-CM

## 2013-07-23 DIAGNOSIS — E11319 Type 2 diabetes mellitus with unspecified diabetic retinopathy without macular edema: Secondary | ICD-10-CM

## 2013-07-23 DIAGNOSIS — H3530 Unspecified macular degeneration: Secondary | ICD-10-CM

## 2013-07-23 DIAGNOSIS — H35039 Hypertensive retinopathy, unspecified eye: Secondary | ICD-10-CM

## 2013-07-23 LAB — BASIC METABOLIC PANEL WITH GFR
BUN: 20 mg/dL (ref 6–23)
CO2: 28 mEq/L (ref 19–32)
Calcium: 9.8 mg/dL (ref 8.4–10.5)
Chloride: 100 mEq/L (ref 96–112)
Creat: 1.14 mg/dL (ref 0.50–1.35)
GFR, Est African American: 70 mL/min
GFR, Est Non African American: 60 mL/min
Glucose, Bld: 142 mg/dL — ABNORMAL HIGH (ref 70–99)
Potassium: 4.5 mEq/L (ref 3.5–5.3)
Sodium: 138 mEq/L (ref 135–145)

## 2013-07-23 NOTE — Progress Notes (Signed)
Quick Note:  All labs are normal. ______ 

## 2013-07-25 DIAGNOSIS — K56609 Unspecified intestinal obstruction, unspecified as to partial versus complete obstruction: Secondary | ICD-10-CM

## 2013-07-25 HISTORY — DX: Unspecified intestinal obstruction, unspecified as to partial versus complete obstruction: K56.609

## 2013-07-26 ENCOUNTER — Other Ambulatory Visit: Payer: Self-pay | Admitting: Family Medicine

## 2013-08-05 ENCOUNTER — Telehealth: Payer: Self-pay | Admitting: *Deleted

## 2013-08-05 NOTE — Telephone Encounter (Signed)
Those vitals are reassuring that he does not necessarily need emergent fluids however I'd strongly encourage them to go to urgent care for an evaluation and medications to help reduce symptoms so she doesn't get dehydrated.

## 2013-08-05 NOTE — Telephone Encounter (Signed)
Pt's wife left urgent vm stating that pt had vertigo all night & has had diarrhea 10-11 times & it's nothing but clear liquid now.  BP is 123/57 pulse 99.  She states that he has no appetite but she is trying to push fluids on him.  Please advise if he should go to ED for fluids.

## 2013-08-05 NOTE — Telephone Encounter (Signed)
Pt's wife notified.  She states that he took immodium last night & hasn't had another bm today.  She is encouraging water & I also advised her to try some gatorade as well for some electrolytes.  Advised wife to bring him to UC if he begins to decline any.

## 2013-08-06 ENCOUNTER — Inpatient Hospital Stay (HOSPITAL_COMMUNITY)
Admission: EM | Admit: 2013-08-06 | Discharge: 2013-08-09 | DRG: 389 | Disposition: A | Discharge status: Home / Self Care | Payer: Medicare Other | Attending: Internal Medicine | Admitting: Internal Medicine

## 2013-08-06 ENCOUNTER — Emergency Department (HOSPITAL_COMMUNITY): Payer: Medicare Other

## 2013-08-06 ENCOUNTER — Encounter (HOSPITAL_COMMUNITY): Payer: Self-pay | Admitting: Emergency Medicine

## 2013-08-06 DIAGNOSIS — N3010 Interstitial cystitis (chronic) without hematuria: Secondary | ICD-10-CM

## 2013-08-06 DIAGNOSIS — E860 Dehydration: Secondary | ICD-10-CM | POA: Diagnosis present

## 2013-08-06 DIAGNOSIS — I679 Cerebrovascular disease, unspecified: Secondary | ICD-10-CM

## 2013-08-06 DIAGNOSIS — H3530 Unspecified macular degeneration: Secondary | ICD-10-CM

## 2013-08-06 DIAGNOSIS — I798 Other disorders of arteries, arterioles and capillaries in diseases classified elsewhere: Secondary | ICD-10-CM | POA: Diagnosis present

## 2013-08-06 DIAGNOSIS — I10 Essential (primary) hypertension: Secondary | ICD-10-CM

## 2013-08-06 DIAGNOSIS — K56609 Unspecified intestinal obstruction, unspecified as to partial versus complete obstruction: Secondary | ICD-10-CM

## 2013-08-06 DIAGNOSIS — R002 Palpitations: Secondary | ICD-10-CM

## 2013-08-06 DIAGNOSIS — H269 Unspecified cataract: Secondary | ICD-10-CM

## 2013-08-06 DIAGNOSIS — E1159 Type 2 diabetes mellitus with other circulatory complications: Secondary | ICD-10-CM | POA: Diagnosis present

## 2013-08-06 DIAGNOSIS — Z800 Family history of malignant neoplasm of digestive organs: Secondary | ICD-10-CM

## 2013-08-06 DIAGNOSIS — N179 Acute kidney failure, unspecified: Secondary | ICD-10-CM

## 2013-08-06 DIAGNOSIS — R972 Elevated prostate specific antigen [PSA]: Secondary | ICD-10-CM

## 2013-08-06 DIAGNOSIS — R109 Unspecified abdominal pain: Secondary | ICD-10-CM

## 2013-08-06 DIAGNOSIS — I700 Atherosclerosis of aorta: Secondary | ICD-10-CM | POA: Diagnosis present

## 2013-08-06 DIAGNOSIS — E785 Hyperlipidemia, unspecified: Secondary | ICD-10-CM

## 2013-08-06 DIAGNOSIS — R197 Diarrhea, unspecified: Secondary | ICD-10-CM

## 2013-08-06 DIAGNOSIS — E872 Acidosis, unspecified: Secondary | ICD-10-CM

## 2013-08-06 DIAGNOSIS — K5289 Other specified noninfective gastroenteritis and colitis: Secondary | ICD-10-CM | POA: Diagnosis present

## 2013-08-06 DIAGNOSIS — Z9049 Acquired absence of other specified parts of digestive tract: Secondary | ICD-10-CM

## 2013-08-06 DIAGNOSIS — Z860100 Personal history of colon polyps, unspecified: Secondary | ICD-10-CM

## 2013-08-06 DIAGNOSIS — H9190 Unspecified hearing loss, unspecified ear: Secondary | ICD-10-CM

## 2013-08-06 DIAGNOSIS — E1151 Type 2 diabetes mellitus with diabetic peripheral angiopathy without gangrene: Secondary | ICD-10-CM

## 2013-08-06 DIAGNOSIS — K56600 Partial intestinal obstruction, unspecified as to cause: Secondary | ICD-10-CM

## 2013-08-06 DIAGNOSIS — K565 Intestinal adhesions [bands], unspecified as to partial versus complete obstruction: Principal | ICD-10-CM | POA: Diagnosis present

## 2013-08-06 DIAGNOSIS — E663 Overweight: Secondary | ICD-10-CM

## 2013-08-06 DIAGNOSIS — I2510 Atherosclerotic heart disease of native coronary artery without angina pectoris: Secondary | ICD-10-CM

## 2013-08-06 DIAGNOSIS — Z789 Other specified health status: Secondary | ICD-10-CM

## 2013-08-06 DIAGNOSIS — I6529 Occlusion and stenosis of unspecified carotid artery: Secondary | ICD-10-CM

## 2013-08-06 DIAGNOSIS — I739 Peripheral vascular disease, unspecified: Secondary | ICD-10-CM

## 2013-08-06 DIAGNOSIS — Z79899 Other long term (current) drug therapy: Secondary | ICD-10-CM

## 2013-08-06 DIAGNOSIS — N400 Enlarged prostate without lower urinary tract symptoms: Secondary | ICD-10-CM

## 2013-08-06 DIAGNOSIS — D649 Anemia, unspecified: Secondary | ICD-10-CM

## 2013-08-06 DIAGNOSIS — K219 Gastro-esophageal reflux disease without esophagitis: Secondary | ICD-10-CM

## 2013-08-06 DIAGNOSIS — Z8249 Family history of ischemic heart disease and other diseases of the circulatory system: Secondary | ICD-10-CM

## 2013-08-06 DIAGNOSIS — Z87891 Personal history of nicotine dependence: Secondary | ICD-10-CM

## 2013-08-06 DIAGNOSIS — Z8719 Personal history of other diseases of the digestive system: Secondary | ICD-10-CM | POA: Diagnosis present

## 2013-08-06 DIAGNOSIS — Z7982 Long term (current) use of aspirin: Secondary | ICD-10-CM

## 2013-08-06 DIAGNOSIS — Z951 Presence of aortocoronary bypass graft: Secondary | ICD-10-CM

## 2013-08-06 DIAGNOSIS — Z833 Family history of diabetes mellitus: Secondary | ICD-10-CM

## 2013-08-06 HISTORY — DX: Benign neoplasm of colon, unspecified: D12.6

## 2013-08-06 LAB — COMPREHENSIVE METABOLIC PANEL
ALT: 20 U/L (ref 0–53)
AST: 22 U/L (ref 0–37)
Albumin: 4.5 g/dL (ref 3.5–5.2)
Alkaline Phosphatase: 69 U/L (ref 39–117)
BUN: 68 mg/dL — ABNORMAL HIGH (ref 6–23)
CO2: 16 mEq/L — ABNORMAL LOW (ref 19–32)
Calcium: 9.7 mg/dL (ref 8.4–10.5)
Chloride: 94 mEq/L — ABNORMAL LOW (ref 96–112)
Creatinine, Ser: 2.7 mg/dL — ABNORMAL HIGH (ref 0.50–1.35)
GFR calc Af Amer: 24 mL/min — ABNORMAL LOW (ref >90)
GFR calc non Af Amer: 21 mL/min — ABNORMAL LOW (ref >90)
Glucose, Bld: 175 mg/dL — ABNORMAL HIGH (ref 70–99)
Potassium: 5.1 mEq/L (ref 3.7–5.3)
Sodium: 132 mEq/L — ABNORMAL LOW (ref 137–147)
Total Bilirubin: 0.4 mg/dL (ref 0.3–1.2)
Total Protein: 8.5 g/dL — ABNORMAL HIGH (ref 6.0–8.3)

## 2013-08-06 LAB — CBC WITH DIFFERENTIAL/PLATELET
Basophils Absolute: 0 10*3/uL (ref 0.0–0.1)
Basophils Relative: 0 % (ref 0–1)
Eosinophils Absolute: 0 10*3/uL (ref 0.0–0.7)
Eosinophils Relative: 0 % (ref 0–5)
HCT: 42.2 % (ref 39.0–52.0)
Hemoglobin: 14.5 g/dL (ref 13.0–17.0)
Lymphocytes Relative: 10 % — ABNORMAL LOW (ref 12–46)
Lymphs Abs: 0.9 10*3/uL (ref 0.7–4.0)
MCH: 29 pg (ref 26.0–34.0)
MCHC: 34.4 g/dL (ref 30.0–36.0)
MCV: 84.4 fL (ref 78.0–100.0)
Monocytes Absolute: 0.8 10*3/uL (ref 0.1–1.0)
Monocytes Relative: 9 % (ref 3–12)
Neutro Abs: 7.5 10*3/uL (ref 1.7–7.7)
Neutrophils Relative %: 82 % — ABNORMAL HIGH (ref 43–77)
Platelets: 233 10*3/uL (ref 150–400)
RBC: 5 MIL/uL (ref 4.22–5.81)
RDW: 14 % (ref 11.5–15.5)
WBC: 9.2 10*3/uL (ref 4.0–10.5)

## 2013-08-06 LAB — URINE MICROSCOPIC-ADD ON

## 2013-08-06 LAB — URINALYSIS, ROUTINE W REFLEX MICROSCOPIC
Bilirubin Urine: NEGATIVE
Glucose, UA: NEGATIVE mg/dL
Hgb urine dipstick: NEGATIVE
Ketones, ur: NEGATIVE mg/dL
Leukocytes, UA: NEGATIVE
Nitrite: NEGATIVE
Protein, ur: 30 mg/dL — AB
Specific Gravity, Urine: 1.019 (ref 1.005–1.030)
Urobilinogen, UA: 0.2 mg/dL (ref 0.0–1.0)
pH: 5 (ref 5.0–8.0)

## 2013-08-06 LAB — LIPASE, BLOOD: Lipase: 36 U/L (ref 11–59)

## 2013-08-06 LAB — LACTIC ACID, PLASMA: Lactic Acid, Venous: 1.8 mmol/L (ref 0.5–2.2)

## 2013-08-06 MED ORDER — BISACODYL 10 MG RE SUPP: 10.0000 mg | Freq: Two times a day (BID) | RECTAL | Status: DC | PRN

## 2013-08-06 MED ORDER — SACCHAROMYCES BOULARDII 250 MG PO CAPS
250.0000 mg | ORAL_CAPSULE | Freq: Two times a day (BID) | ORAL | Status: DC
  Administered 2013-08-07 – 2013-08-09 (×6): 250 mg via ORAL
  Filled 2013-08-06 (×7): qty 1

## 2013-08-06 MED ORDER — SODIUM CHLORIDE 0.9 % IV BOLUS (SEPSIS)
1000.0000 mL | Freq: Once | INTRAVENOUS | Status: AC
  Administered 2013-08-06: 1000 mL via INTRAVENOUS

## 2013-08-06 MED ORDER — ALUM & MAG HYDROXIDE-SIMETH 200-200-20 MG/5ML PO SUSP: 30.0000 mL | Freq: Four times a day (QID) | ORAL | Status: DC | PRN

## 2013-08-06 MED ORDER — FAMOTIDINE 20 MG PO TABS
20.0000 mg | ORAL_TABLET | Freq: Once | ORAL | Status: AC
  Administered 2013-08-06: 20 mg via ORAL
  Filled 2013-08-06: qty 1

## 2013-08-06 MED ORDER — DIPHENHYDRAMINE HCL 50 MG/ML IJ SOLN: 12.5000 mg | Freq: Four times a day (QID) | INTRAMUSCULAR | Status: DC | PRN

## 2013-08-06 MED ORDER — BISMUTH SUBSALICYLATE 262 MG/15ML PO SUSP
30.0000 mL | Freq: Three times a day (TID) | ORAL | Status: DC | PRN
  Filled 2013-08-06: qty 236

## 2013-08-06 MED ORDER — HYDROMORPHONE HCL PF 1 MG/ML IJ SOLN
0.5000 mg | Freq: Once | INTRAMUSCULAR | Status: AC
  Administered 2013-08-06: 0.5 mg via INTRAVENOUS
  Filled 2013-08-06: qty 1

## 2013-08-06 MED ORDER — ONDANSETRON HCL 4 MG/2ML IJ SOLN
4.0000 mg | Freq: Once | INTRAMUSCULAR | Status: AC
  Administered 2013-08-06: 4 mg via INTRAVENOUS
  Filled 2013-08-06: qty 2

## 2013-08-06 MED ORDER — ONDANSETRON HCL 4 MG/2ML IJ SOLN
4.0000 mg | Freq: Four times a day (QID) | INTRAMUSCULAR | Status: DC | PRN
Start: 2013-08-06 — End: 2013-08-09

## 2013-08-06 MED ORDER — LACTATED RINGERS IV BOLUS (SEPSIS): 1000.0000 mL | Freq: Three times a day (TID) | INTRAVENOUS | Status: AC | PRN

## 2013-08-06 MED ORDER — MENTHOL 3 MG MT LOZG
1.0000 | LOZENGE | OROMUCOSAL | Status: DC | PRN
  Filled 2013-08-06 (×2): qty 9

## 2013-08-06 MED ORDER — LIP MEDEX EX OINT
1.0000 "application " | TOPICAL_OINTMENT | Freq: Two times a day (BID) | CUTANEOUS | Status: DC
  Administered 2013-08-07 – 2013-08-08 (×5): 1 via TOPICAL
  Filled 2013-08-06: qty 7

## 2013-08-06 MED ORDER — MAGIC MOUTHWASH
15.0000 mL | Freq: Four times a day (QID) | ORAL | Status: DC | PRN
  Filled 2013-08-06: qty 15

## 2013-08-06 MED ORDER — ONDANSETRON 8 MG/NS 50 ML IVPB
8.0000 mg | Freq: Four times a day (QID) | INTRAVENOUS | Status: DC | PRN
  Filled 2013-08-06: qty 8

## 2013-08-06 MED ORDER — PHENOL 1.4 % MT LIQD
2.0000 | OROMUCOSAL | Status: DC | PRN
Start: 2013-08-06 — End: 2013-08-09
  Filled 2013-08-06 (×2): qty 177

## 2013-08-06 MED ORDER — LACTATED RINGERS IV BOLUS (SEPSIS)
1000.0000 mL | Freq: Once | INTRAVENOUS | Status: AC
  Administered 2013-08-06: 1000 mL via INTRAVENOUS

## 2013-08-06 MED ORDER — SODIUM CHLORIDE 0.9 % IV SOLN
INTRAVENOUS | Status: DC
  Administered 2013-08-07 (×2): via INTRAVENOUS

## 2013-08-06 NOTE — ED Provider Notes (Signed)
CSN: [id number]     Arrival date & time 08/06/13  1444 History   First MD Initiated Contact with Patient 08/06/13 1637     Chief Complaint  Patient presents with  . Diarrhea  . Abdominal Pain     (Consider location/radiation/quality/duration/timing/severity/associated sxs/prior Treatment) HPI Comments: 80 year old male presents with 48 hours of diarrhea. Family notes she's had 15-16 loose watery stools. This also cut a peer claylike. There's been no blood. He's been nauseous without vomiting. He's also had some sore throat. No fevers. He's had a little bit of right-sided upper abdominal pain but this is intermittent and not currently hurting. No back pain. No urinary symptoms including no hematuria. He's been able to drink some Gatorade but has had decreased appetite and decreased by mouth intake due to the nausea. Family brought him in because they're concerned he is getting dehydrated.   Past Medical History  Diagnosis Date  . DM (diabetes mellitus)   . CAD (coronary artery disease)   . Vertigo   . HTN (hypertension)   . Other and unspecified hyperlipidemia   . Cerebrovascular disease, unspecified   . Elevated PSA   . Interstitial cystitis   . Statin intolerance   . Anemia, blood loss     history of  . PVD (peripheral vascular disease)   . Carotid artery occlusion    Past Surgical History  Procedure Laterality Date  . Kidney stone surgery  1972  . Coronary artery bypass graft  2001    4  . Partial colectomy  2011    dr [name]...low grade dysplasia  . Left carpal tunnel release  2009  . Carotid endarterectomy  2012    right  . Tonsillectomy  1940  . Colon surgery  2011   Family History  Problem Relation Age of Onset  . Diabetes Mother   . Heart disease Mother   . Heart disease Father   . Colon cancer Maternal Uncle     x4   History  Substance Use Topics  . Smoking status: Former Smoker    Quit date: 11/23/1999  . Smokeless tobacco: Current User    Types:  Chew  . Alcohol Use: No    Review of Systems  Constitutional: Negative for fever.  HENT: Positive for sore throat. Negative for trouble swallowing and voice change.   Gastrointestinal: Positive for nausea, abdominal pain and diarrhea. Negative for vomiting, constipation, blood in stool and abdominal distention.  Genitourinary: Negative for dysuria, hematuria and decreased urine volume.  Musculoskeletal: Negative for back pain.  All other systems reviewed and are negative.      Allergies  Shellfish allergy; Atorvastatin; Ezetimibe; Januvia; Pravastatin sodium; Rosuvastatin; and Statins  Home Medications   Current Outpatient Rx  Name  Route  Sig  Dispense  Refill  . amLODipine (NORVASC) 10 MG tablet      TAKE 1 TABLET BY MOUTH DAILY.   30 tablet   5   . aspirin 325 MG tablet   Oral   Take 325 mg by mouth daily.           Marland Kitchen glipiZIDE (GLUCOTROL XL) 5 MG 24 hr tablet      TAKE 1 TABLET (5 MG TOTAL) BY MOUTH DAILY.   30 tablet   6   . lansoprazole (PREVACID) 15 MG capsule   Oral   Take 15 mg by mouth daily.           Marland Kitchen lisinopril (PRINIVIL,ZESTRIL) 20 MG tablet  TAKE 1 TABLET (20 MG TOTAL) BY MOUTH AT BEDTIME.   30 tablet   4   . metFORMIN (GLUCOPHAGE) 1000 MG tablet      TAKE 1 TABLET BY MOUTH 2 TIMES DAILY WITH A MEAL   60 tablet   6   . metoprolol (LOPRESSOR) 50 MG tablet      TAKE 1 TABLET (50 MG TOTAL) BY MOUTH 2 (TWO) TIMES DAILY.   60 tablet   0   . Multiple Vitamins-Minerals (PRESERVISION AREDS 2 PO)   Oral   Take 2 tablets by mouth daily.          BP 122/54  Pulse 79  Temp(Src) 98.3 F (36.8 C) (Oral)  Resp 16  SpO2 96% Physical Exam  Nursing note and vitals reviewed. Constitutional: He is oriented to person, place, and time. He appears well-developed and well-nourished. No distress.  HENT:  Head: Normocephalic and atraumatic.  Right Ear: External ear normal.  Left Ear: External ear normal.  Nose: Nose normal.  Dry mucous  membranes  Eyes: Right eye exhibits no discharge. Left eye exhibits no discharge.  Neck: Neck supple.  Cardiovascular: Normal rate, regular rhythm, normal heart sounds and intact distal pulses.   Pulmonary/Chest: Effort normal.  Abdominal: Soft. There is tenderness (mild right upper/right flank tenderness. No CVA tenderness). There is no CVA tenderness.  Neurological: He is alert and oriented to person, place, and time.  Skin: Skin is warm and dry.    ED Course  Procedures (including critical care time) Labs Review Labs Reviewed  CBC WITH DIFFERENTIAL - Abnormal; Notable for the following:    Neutrophils Relative % 82 (*)    Lymphocytes Relative 10 (*)    All other components within normal limits  COMPREHENSIVE METABOLIC PANEL - Abnormal; Notable for the following:    Sodium 132 (*)    Chloride 94 (*)    CO2 16 (*)    Glucose, Bld 175 (*)    BUN 68 (*)    Creatinine, Ser 2.70 (*)    Total Protein 8.5 (*)    GFR calc non Af Amer 21 (*)    GFR calc Af Amer 24 (*)    All other components within normal limits  LIPASE, BLOOD  URINALYSIS, ROUTINE W REFLEX MICROSCOPIC   Imaging Review Ct Abdomen Pelvis Wo Contrast  08/06/2013   CLINICAL DATA:  Abdominal pain with vomiting  EXAM: CT ABDOMEN AND PELVIS WITHOUT CONTRAST  TECHNIQUE: Multidetector CT imaging of the abdomen and pelvis was performed following the standard protocol without intravenous contrast.  COMPARISON:  Abdominal ultrasound performed earlier today, CT abdomen/pelvis 04/11/2010  FINDINGS: Lower Chest: The lung bases are clear. Visualized cardiac structures within normal limits for size. Extensive atherosclerotic calcifications throughout the visualized coronary arteries. No pericardial effusion. Unremarkable visualized distal thoracic esophagus.  Abdomen: Unenhanced CT was performed per clinician order. Lack of IV contrast limits sensitivity and specificity, especially for evaluation of abdominal/pelvic solid viscera. Within  these limitations, unremarkable CT appearance of the stomach, duodenum, spleen, adrenal glands and pancreas. Normal hepatic contour and morphology. Multiple small 2-3 mm hypo attenuating foci throughout liver are nonspecific and too small to accurately characterize. Layering high attenuation within the gallbladder lumen favored to reflect gallbladder sludge. Small stones felt unlikely given the negative recent abdominal ultrasound.  Unremarkable appearance of the bilateral kidneys. No focal solid lesion, hydronephrosis or nephrolithiasis.  Highly redundant sigmoid colon. Scattered colonic diverticula without evidence of active inflammation. Surgical changes of prior right hemicolectomy.  Entero colonic anastomosis appears widely patent. Multiple loops of dilated proximal and mid small bowel with air-fluid levels are noted in the mid abdomen. Some of the more distal loops of bowel have a flocculent appearance to their internal contents. There is no definite transition point, more a transition to normal caliber distal ileum with several acute angulations in the right hemi abdomen. This is likely obstruction related to underlying surgical adhesive disease. There is some trace inflammatory stranding around loops of bowel in the right lower quadrant with very small amounts of interloop fluid. No free air.  Pelvis: Prostatomegaly. The prostate gland measures 5 x 5.5 cm and demonstrates coarse internal calcifications. Indents the bladder base. The bladder is otherwise unremarkable. No free fluid or suspicious adenopathy.  Bones/Soft Tissues: No acute fracture or aggressive appearing lytic or blastic osseous lesion.  Vascular: Limited evaluation in the absence of intravenous contrast. Extensive atherosclerotic vascular calcifications without aneurysmal dilatation.  IMPRESSION: 1. Small bowel obstruction likely secondary to surgical adhesive disease. No definite transition point identified. 2. Surgical changes of prior right  hemicolectomy. The entero colonic anastomosis is widely patent. 3. Multiple tiny 2- 3 mm hypo attenuating foci throughout the liver are nonspecific and too small to accurately characterize. These could not be definitively identified on the prior CT scan of the abdomen and pelvis from October of 2011. 4. High attenuation material layering within the gallbladder lumen favored to reflect gallbladder sludge. 5. Colonic diverticular disease without CT evidence of active inflammation. 6. Prostatomegaly 7. Extensive atherosclerotic vascular calcifications without aneurysmal dilatation.   Electronically Signed   By: Jacqulynn Cadet M.D.   On: 08/06/2013 21:08   US Abdomen Complete  08/06/2013   CLINICAL DATA:  Right upper quadrant and right flank pain.  EXAM: ULTRASOUND ABDOMEN COMPLETE  COMPARISON:  CT abdomen/pelvis 04/11/2010  FINDINGS: Gallbladder:  No gallstones or wall thickening visualized. No sonographic Murphy sign noted.  Common bile duct:  Diameter: 4 mm.  Liver:  No focal lesion identified. Within normal limits in parenchymal echogenicity.  IVC:  No abnormality visualized.  Pancreas:  Visualized portion unremarkable.  Spleen:  Size and appearance within normal limits.  Right Kidney:  Length: 10.9 cm. Echogenicity within normal limits. No mass or hydronephrosis visualized.  Left Kidney:  Length: 11.0 cm. Echogenicity within normal limits. No mass or hydronephrosis visualized.  Abdominal aorta:  Mid to distal segment not well visualized due to abundant overlying bowel gas. Proximal segment within normal.  Other findings:  None.  IMPRESSION: No acute findings.   Electronically Signed   By: Marin Olp M.D.   On: 08/06/2013 18:05    EKG Interpretation   None       MDM   Final diagnoses:  Acute renal failure  Diarrhea  Small bowel obstruction    Patient with ARF from his diarrhea and poor po intake. Initially minimal tenderness, given location of pain RUQ u/s ordered. Pain returned while in ED  so CT w/o contrast ordered given his history of colon resection. Due to amount of dehydration and acute kidney injury will need admission for IV fluid resuscitation. SBO read by radiology, will consult surgery but medicine will admit for IV hydration.    Ephraim Hamburger, MD 08/06/13 [phone]

## 2013-08-06 NOTE — Consult Note (Signed)
Clayton Harrison  01/04/1931 [id number]  CARE TEAM:  PCP: [name], MD  Outpatient Care Team: Patient Care Team: Hali Marry, MD as PCP - General (Family Medicine) Lelon Perla, MD (Cardiology) Rosetta Posner, MD as Attending Physician (Vascular Surgery) Simona Huh, MD as Consulting Physician (Dermatology) Hayden Pedro, MD as Attending Physician (Ophthalmology) Irene Shipper, MD as Attending Physician (Gastroenterology) Irine Seal, MD as Attending Physician (Urology) Johna Sheriff, MD as Attending Physician (Ophthalmology)  Inpatient Treatment Team: Treatment Team: Attending Provider: Louellen Molder, MD; Registered Nurse: Vonna Kotyk, RN; Technician: Gale Journey, NT; Rounding Team: Melven Sartorius Alla Feeling, MD  This patient is a 80 y.o.male who presents today for surgical evaluation at the request of Dr Sydell Axon ED MD.   Reason for evaluation: Abdominal pain and diarrhea.  Possible partial small bowel obstruction  Elderly male with numerous health issues.  He comes in today with the women in his life.  History of colon polyps.  Status post laparoscopic converted to open proximal colectomy for a large tubovillous adenoma not able to be resected endoscopically.  This was in 2011.  Developed a chronic wound with seroma that required some debridement but eventually healed up.  Normally has about 2 bowel movements a day since his colectomy.  No bad bouts of constipation/diarrhea.  Appetite has been fair but his weight has been stable.  No history of postprandial nausea, vomiting, right upper quadrant/back pain  Apparently he is having worsening diarrhea the past few days.  It was rather severe.  Took Imodium.  That blocked him up and he is not have a bowel movement that day.  His wife was concerned.  Brought to emergency room.  Found to be in renal failure.  Some abdominal pain.  Ultrasound shows no stones or inflammation of the gallbladder.  CT scan shows  dilated small bowel loops.  Concern for possible partial small bowel obstruction despite a history of massive diarrhea reversed with Imodium.  No personal nor family history of GI/colon cancer, inflammatory bowel disease, irritable bowel syndrome, allergy such as Celiac Sprue, dietary/dairy problems, colitis, ulcers nor gastritis.  No recent sick contacts/gastroenteritis.  No travel outside the country.  No changes in diet.  No dysphagia to solids or liquids.  No significant heartburn or reflux.  No hematochezia, hematemesis, coffee ground emesis.  No evidence of prior gastric/peptic ulceration.    Past Medical History  Diagnosis Date  . DM (diabetes mellitus)   . CAD (coronary artery disease)   . Vertigo   . HTN (hypertension)   . Other and unspecified hyperlipidemia   . Cerebrovascular disease, unspecified   . Elevated PSA   . Interstitial cystitis   . Statin intolerance   . Anemia, blood loss     history of  . PVD (peripheral vascular disease)   . Carotid artery occlusion     Past Surgical History  Procedure Laterality Date  . Kidney stone surgery  1972  . Coronary artery bypass graft  2001    4  . Partial colectomy  05/24/2010    Lap converted to open right proximal colectomy  . Left carpal tunnel release  2009  . Carotid endarterectomy  2012    right  . Tonsillectomy  1940    History   Social History  . Marital Status: Married    Spouse Name: Clayton Harrison    Number of Children: 2  . Years of Education: HS   Occupational History  . Retired  Social History Main Topics  . Smoking status: Former Smoker    Quit date: 11/23/1999  . Smokeless tobacco: Current User    Types: Chew  . Alcohol Use: No  . Drug Use: No  . Sexual Activity: Yes    Partners: Female   Other Topics Concern  . Not on file   Social History Narrative   No regular exercise. Daily caffeine- 3-4 cups daily          Family History  Problem Relation Age of Onset  . Diabetes Mother   . Heart  disease Mother   . Heart disease Father   . Colon cancer Maternal Uncle     x4    Current Facility-Administered Medications  Medication Dose Route Frequency Provider Last Rate Last Dose  . 0.9 %  sodium chloride infusion   Intravenous Continuous Ephraim Hamburger, MD       Current Outpatient Prescriptions  Medication Sig Dispense Refill  . acetaminophen (TYLENOL) 500 MG tablet Take 1,000 mg by mouth every 6 (six) hours as needed for mild pain.      Marland Kitchen amLODipine (NORVASC) 10 MG tablet Take 10 mg by mouth daily.      Marland Kitchen aspirin 325 MG tablet Take 325 mg by mouth daily.        Marland Kitchen glipiZIDE (GLUCOTROL XL) 5 MG 24 hr tablet Take 5 mg by mouth daily with breakfast.      . lansoprazole (PREVACID) 15 MG capsule Take 15 mg by mouth daily as needed (acid reflux).       Marland Kitchen lisinopril (PRINIVIL,ZESTRIL) 20 MG tablet Take 20 mg by mouth at bedtime.      Marland Kitchen loperamide (IMODIUM A-D) 2 MG tablet Take 2 mg by mouth 4 (four) times daily as needed for diarrhea or loose stools.      . meclizine (ANTIVERT) 25 MG tablet Take 12.5 mg by mouth as needed for dizziness.      . metFORMIN (GLUCOPHAGE) 1000 MG tablet Take 1,000 mg by mouth 2 (two) times daily with a meal.      . metoprolol (LOPRESSOR) 50 MG tablet Take 50 mg by mouth 2 (two) times daily.      . Multiple Vitamins-Minerals (PRESERVISION AREDS 2 PO) Take 1 tablet by mouth 2 (two) times daily.       Vladimir Faster Glycol-Propyl Glycol (SYSTANE) 0.4-0.3 % SOLN Apply 2 drops to eye 2 (two) times daily as needed (dry eye).         Allergies  Allergen Reactions  . Shellfish Allergy Anaphylaxis    Swelling of the throat  . Atorvastatin     REACTION: urinary frequency  . Ezetimibe     REACTION: "hip problems"  . Januvia [Sitagliptin] Other (See Comments)    Nausea and vomiting  . Pravastatin Sodium     REACTION: myalgia  . Rosuvastatin   . Statins     ROS: Constitutional:  No fevers, chills, sweats.  Weight stable Eyes:  No vision changes, No  discharge HENT:  No sore throats, nasal drainage Lymph: No neck swelling, No bruising easily Pulmonary:  No cough, productive sputum CV: No orthopnea, PND  Patient walks 10 minutes for about 1/4 miles without difficulty.  No exertional chest/neck/shoulder/arm pain. GI:  No personal nor family history of GI/colon cancer, inflammatory bowel disease, irritable bowel syndrome, allergy such as Celiac Sprue, dietary/dairy problems, colitis, ulcers nor gastritis.  No recent sick contacts/gastroenteritis.  No travel outside the country.  No changes in diet.  Renal: No UTIs, No hematuria Genital:  No drainage, bleeding, masses Musculoskeletal: No severe joint pain.  Good ROM major joints Skin:  No sores or lesions.  No rashes Heme/Lymph:  No easy bleeding.  No swollen lymph nodes Neuro: No focal weakness/numbness.  No seizures Psych: No suicidal ideation.  No hallucinations  BP 138/58  Pulse 81  Temp(Src) 98.9 F (37.2 C) (Oral)  Resp 16  SpO2 93%  Physical Exam: General: Pt awake/alert/oriented x4 in no major acute distress.  A little tired but not toxic/sickly Eyes: PERRL, normal EOM. Sclera nonicteric Neuro: CN II-XII intact w/o focal sensory/motor deficits. Lymph: No head/neck/groin lymphadenopathy Psych:  No delerium/psychosis/paranoia HENT: Normocephalic, Mucus membranes moist.  No thrush.  HOH Neck: Supple, No tracheal deviation Chest: No pain.  Good respiratory excursion. CV:  Pulses intact.  Regular rhythm Abdomen: Soft, Nondistended.  Mild diffuse abdominal discomfort but not severe.  No peritonitis.  No pain with cough/bed shake/percussion.  No evidence of any hernias. Genitourinary: Normal external male genitalia.  No obvious testicular masses.  No inguinal hernias. Ext:  SCDs BLE.  No significant edema.  No cyanosis Skin: No petechiae / purpurea.  No major sores Musculoskeletal: No severe joint pain.  Good ROM major joints   Results:   Labs: Results for orders placed  during the hospital encounter of 08/06/13 (from the past 48 hour(s))  CBC WITH DIFFERENTIAL     Status: Abnormal   Collection Time    08/06/13  4:40 PM      Result Value Ref Range   WBC 9.2  4.0 - 10.5 K/uL   RBC 5.00  4.22 - 5.81 MIL/uL   Hemoglobin 14.5  13.0 - 17.0 g/dL   HCT 42.2  39.0 - 52.0 %   MCV 84.4  78.0 - 100.0 fL   MCH 29.0  26.0 - 34.0 pg   MCHC 34.4  30.0 - 36.0 g/dL   RDW 14.0  11.5 - 15.5 %   Platelets 233  150 - 400 K/uL   Neutrophils Relative % 82 (*) 43 - 77 %   Neutro Abs 7.5  1.7 - 7.7 K/uL   Lymphocytes Relative 10 (*) 12 - 46 %   Lymphs Abs 0.9  0.7 - 4.0 K/uL   Monocytes Relative 9  3 - 12 %   Monocytes Absolute 0.8  0.1 - 1.0 K/uL   Eosinophils Relative 0  0 - 5 %   Eosinophils Absolute 0.0  0.0 - 0.7 K/uL   Basophils Relative 0  0 - 1 %   Basophils Absolute 0.0  0.0 - 0.1 K/uL  COMPREHENSIVE METABOLIC PANEL     Status: Abnormal   Collection Time    08/06/13  4:40 PM      Result Value Ref Range   Sodium 132 (*) 137 - 147 mEq/L   Potassium 5.1  3.7 - 5.3 mEq/L   Chloride 94 (*) 96 - 112 mEq/L   CO2 16 (*) 19 - 32 mEq/L   Glucose, Bld 175 (*) 70 - 99 mg/dL   BUN 68 (*) 6 - 23 mg/dL   Creatinine, Ser 2.70 (*) 0.50 - 1.35 mg/dL   Calcium 9.7  8.4 - 10.5 mg/dL   Total Protein 8.5 (*) 6.0 - 8.3 g/dL   Albumin 4.5  3.5 - 5.2 g/dL   AST 22  0 - 37 U/L   ALT 20  0 - 53 U/L   Alkaline Phosphatase 69  39 - 117 U/L  Total Bilirubin 0.4  0.3 - 1.2 mg/dL   GFR calc non Af Amer 21 (*) >90 mL/min   GFR calc Af Amer 24 (*) >90 mL/min   Comment: (NOTE)     The eGFR has been calculated using the CKD EPI equation.     This calculation has not been validated in all clinical situations.     eGFR's persistently <90 mL/min signify possible Chronic Kidney     Disease.  LIPASE, BLOOD     Status: None   Collection Time    08/06/13  4:40 PM      Result Value Ref Range   Lipase 36  11 - 59 U/L  URINALYSIS, ROUTINE W REFLEX MICROSCOPIC     Status: Abnormal    Collection Time    08/06/13  6:07 PM      Result Value Ref Range   Color, Urine YELLOW  YELLOW   APPearance HAZY (*) CLEAR   Specific Gravity, Urine 1.019  1.005 - 1.030   pH 5.0  5.0 - 8.0   Glucose, UA NEGATIVE  NEGATIVE mg/dL   Hgb urine dipstick NEGATIVE  NEGATIVE   Bilirubin Urine NEGATIVE  NEGATIVE   Ketones, ur NEGATIVE  NEGATIVE mg/dL   Protein, ur 30 (*) NEGATIVE mg/dL   Urobilinogen, UA 0.2  0.0 - 1.0 mg/dL   Nitrite NEGATIVE  NEGATIVE   Leukocytes, UA NEGATIVE  NEGATIVE  URINE MICROSCOPIC-ADD ON     Status: Abnormal   Collection Time    08/06/13  6:07 PM      Result Value Ref Range   WBC, UA 0-2  <3 WBC/hpf   Bacteria, UA RARE  RARE   Casts HYALINE CASTS (*) NEGATIVE   Comment: GRANULAR CAST   Urine-Other AMORPHOUS URATES/PHOSPHATES      Imaging / Studies: Ct Abdomen Pelvis Wo Contrast  08/06/2013   CLINICAL DATA:  Abdominal pain with vomiting  EXAM: CT ABDOMEN AND PELVIS WITHOUT CONTRAST  TECHNIQUE: Multidetector CT imaging of the abdomen and pelvis was performed following the standard protocol without intravenous contrast.  COMPARISON:  Abdominal ultrasound performed earlier today, CT abdomen/pelvis 04/11/2010  FINDINGS: Lower Chest: The lung bases are clear. Visualized cardiac structures within normal limits for size. Extensive atherosclerotic calcifications throughout the visualized coronary arteries. No pericardial effusion. Unremarkable visualized distal thoracic esophagus.  Abdomen: Unenhanced CT was performed per clinician order. Lack of IV contrast limits sensitivity and specificity, especially for evaluation of abdominal/pelvic solid viscera. Within these limitations, unremarkable CT appearance of the stomach, duodenum, spleen, adrenal glands and pancreas. Normal hepatic contour and morphology. Multiple small 2-3 mm hypo attenuating foci throughout liver are nonspecific and too small to accurately characterize. Layering high attenuation within the gallbladder lumen  favored to reflect gallbladder sludge. Small stones felt unlikely given the negative recent abdominal ultrasound.  Unremarkable appearance of the bilateral kidneys. No focal solid lesion, hydronephrosis or nephrolithiasis.  Highly redundant sigmoid colon. Scattered colonic diverticula without evidence of active inflammation. Surgical changes of prior right hemicolectomy. Entero colonic anastomosis appears widely patent. Multiple loops of dilated proximal and mid small bowel with air-fluid levels are noted in the mid abdomen. Some of the more distal loops of bowel have a flocculent appearance to their internal contents. There is no definite transition point, more a transition to normal caliber distal ileum with several acute angulations in the right hemi abdomen. This is likely obstruction related to underlying surgical adhesive disease. There is some trace inflammatory stranding around loops of bowel in  the right lower quadrant with very small amounts of interloop fluid. No free air.  Pelvis: Prostatomegaly. The prostate gland measures 5 x 5.5 cm and demonstrates coarse internal calcifications. Indents the bladder base. The bladder is otherwise unremarkable. No free fluid or suspicious adenopathy.  Bones/Soft Tissues: No acute fracture or aggressive appearing lytic or blastic osseous lesion.  Vascular: Limited evaluation in the absence of intravenous contrast. Extensive atherosclerotic vascular calcifications without aneurysmal dilatation.  IMPRESSION: 1. Small bowel obstruction likely secondary to surgical adhesive disease. No definite transition point identified. 2. Surgical changes of prior right hemicolectomy. The entero colonic anastomosis is widely patent. 3. Multiple tiny 2- 3 mm hypo attenuating foci throughout the liver are nonspecific and too small to accurately characterize. These could not be definitively identified on the prior CT scan of the abdomen and pelvis from October of 2011. 4. High attenuation  material layering within the gallbladder lumen favored to reflect gallbladder sludge. 5. Colonic diverticular disease without CT evidence of active inflammation. 6. Prostatomegaly 7. Extensive atherosclerotic vascular calcifications without aneurysmal dilatation.   Electronically Signed   By: Jacqulynn Cadet M.D.   On: 08/06/2013 21:08   US Abdomen Complete  08/06/2013   CLINICAL DATA:  Right upper quadrant and right flank pain.  EXAM: ULTRASOUND ABDOMEN COMPLETE  COMPARISON:  CT abdomen/pelvis 04/11/2010  FINDINGS: Gallbladder:  No gallstones or wall thickening visualized. No sonographic Murphy sign noted.  Common bile duct:  Diameter: 4 mm.  Liver:  No focal lesion identified. Within normal limits in parenchymal echogenicity.  IVC:  No abnormality visualized.  Pancreas:  Visualized portion unremarkable.  Spleen:  Size and appearance within normal limits.  Right Kidney:  Length: 10.9 cm. Echogenicity within normal limits. No mass or hydronephrosis visualized.  Left Kidney:  Length: 11.0 cm. Echogenicity within normal limits. No mass or hydronephrosis visualized.  Abdominal aorta:  Mid to distal segment not well visualized due to abundant overlying bowel gas. Proximal segment within normal.  Other findings:  None.  IMPRESSION: No acute findings.   Electronically Signed   By: Marin Olp M.D.   On: 08/06/2013 18:05    Medications / Allergies: per chart  Antibiotics: Anti-infectives   None      Assessment  Clayton Harrison  80 y.o. male       Problem List:  Principal Problem:   Acute diarrhea Active Problems:   DM (diabetes mellitus) with peripheral vascular complication   HYPERLIPIDEMIA   HYPERTENSION   CORONARY ARTERY DISEASE   CEREBROVASCULAR DISEASE   PERIPHERAL VASCULAR DISEASE   GERD   BPH (benign prostatic hyperplasia)   H/O right hemicolectomy   Acute kidney injury   Metabolic acidosis   Severe diarrhea with dehydration and renal failure now worse with Imodium.  CT scans  were concerning for possible small bowel obstruction.  Very soft call at best and my view.  Ileus seems more likely picture in my mind.  Plan:  Admit  IV fluid resuscitation.  He is already starting to make good clear urine.  That is hopeful sign.  Probiotic and then later fiber.  Followup x-ray in the morning to see if ingested contrast is got into the colon, arguing against a bowel obstruction.  Keep on sips for now.  Nasogastric tube for backup the patient worked develops worsening nausea or vomiting.  Having bowel movements and feeling better, consider liquid diet and eventually advanced to low residue diet few days  I see no strong evidence of any biliary  or or gallbladder disease.  Agree with medicine evaluation given his complexity for probable viral or other gastroenteritis etiology for his diarrhea.  The patient is stable.  There is no evidence of peritonitis, acute abdomen, nor shock.  There is no strong evidence of failure of improvement nor decline with current non-operative management.  There is no need for surgery at the present moment.  We will continue to follow.   -VTE prophylaxis- SCDs, etc  -mobilize as tolerated to help recovery    Adin Hector, M.D., F.A.C.S. Gastrointestinal and Minimally Invasive Surgery [facility] [address] [place], [place] [address] [phone] Main / Paging   08/06/2013  Note: This dictation was prepared with Dragon/digital dictation along with [facility] technology. Any transcriptional errors that result from this process are unintentional.

## 2013-08-06 NOTE — H&P (Addendum)
[facility] History and Physical  KENN SCHNOOR [id number] DOB: July 12, 1930 DOA: 08/06/2013  Referring physician: ED PCP: [name], MD   Chief Complaint:  Diarrhea with abdominal pain for 2 days  HPI:  80 year old male with history of CAD status post CABG, status post carotid endarterectomy, peripheral vascular disease, type 2 diabetes mellitus, history of vertigo, and a right hemicolectomy in 2011 by Dr. [name] with finding of low-grade dysphagia presented to the ED we did several episodes of watery diarrhea for past 2 days. Patient reports eating some corn beef one day prior to onset of symptoms. He he had about 10-15 episodes of watery diarrhea on first day and again several episodes yesterday. he denies any mucus or blood in the stool. He reports having crampy abdominal pain mainly in the epigastric and supraumbilical area radiating from side to side. He denies any nausea or vomiting. He does report having some dizziness. Patient denies headache, , fever, chills, nausea , vomiting, chest pain, palpitations, SOB,or urinary symptoms. Denies change in weight or appetite. Last episode of diarrhea was this afternoon.  Course in the ED Patient's vitals were stable. Blood will done showed normal CBC. Chemistry done showed a sodium of 132, potassium of 5.1, chloride of 94, CO2 of 16, BUN of 68 and creatinine of 2.7 with an anion gap of 22. Blood glucose was 175. A CT scan of the abdomen done in the ED showed small bowel obstruction likely secondary to postsurgical adhesions. No definite transition point was identified. There were also multiple tiny 2-3 mm hypoattenuating foci throughout the liver which are reported to be non-specific. Also comments on gallbladder sludge and colonic diverticular disease and extensive atherosclerotic vascular calcification. An ultrasound of the abdomen was unremarkable.  Review of Systems:  Constitutional: Denies fever, chills, diaphoresis,  appetite change and fatigue.  HEENT: Denies photophobia,  ear pain, congestion, sore throat, rhinorrhea, sneezing, mouth sores, trouble swallowing, neck pain, neck stiffness and tinnitus.   Respiratory: Denies SOB, DOE, cough, chest tightness,  and wheezing.   Cardiovascular: Denies chest pain, palpitations and leg swelling.  Gastrointestinal: Abdominal pain and watery diarrhea, Denies nausea, vomiting,  constipation, blood in stool and abdominal distention.  Genitourinary: Denies dysuria, urgency, frequency, hematuria, flank pain and difficulty urinating.  Endocrine: Denies polyuria, polydipsia. Musculoskeletal: Denies myalgias, back pain, joint swelling, arthralgias and gait problem.  Skin: Denies pallor, rash and wound.  Neurological: Denies dizziness, seizures, syncope, weakness, light-headedness, numbness and headaches.  Psychiatric/Behavioral: Denies confusion, nervousness, sleep disturbance and agitation   Past Medical History  Diagnosis Date  . DM (diabetes mellitus)   . CAD (coronary artery disease)   . Vertigo   . HTN (hypertension)   . Other and unspecified hyperlipidemia   . Cerebrovascular disease, unspecified   . Elevated PSA   . Interstitial cystitis   . Statin intolerance   . Anemia, blood loss     history of  . PVD (peripheral vascular disease)   . Carotid artery occlusion   . Adenomatous colon polyp     2.5cm excised by right colectomy 2011   Past Surgical History  Procedure Laterality Date  . Kidney stone surgery  1972  . Coronary artery bypass graft  2001    4  . Partial colectomy  05/24/2010    Lap converted to open right proximal colectomy  . Left carpal tunnel release  2009  . Carotid endarterectomy  2012    right  . Tonsillectomy  1940   Social History:  reports that  he quit smoking about 13 years ago. His smokeless tobacco use includes Chew. He reports that he does not drink alcohol or use illicit drugs.  Allergies  Allergen Reactions  .  Shellfish Allergy Anaphylaxis    Swelling of the throat  . Atorvastatin     REACTION: urinary frequency  . Ezetimibe     REACTION: "hip problems"  . Januvia [Sitagliptin] Other (See Comments)    Nausea and vomiting  . Pravastatin Sodium     REACTION: myalgia  . Rosuvastatin   . Statins     Family History  Problem Relation Age of Onset  . Diabetes Mother   . Heart disease Mother   . Heart disease Father   . Colon cancer Maternal Uncle     x4    Prior to Admission medications   Medication Sig Start Date End Date Taking? Authorizing Provider  acetaminophen (TYLENOL) 500 MG tablet Take 1,000 mg by mouth every 6 (six) hours as needed for mild pain.   Yes Historical Provider, MD  amLODipine (NORVASC) 10 MG tablet Take 10 mg by mouth daily.   Yes Historical Provider, MD  aspirin 325 MG tablet Take 325 mg by mouth daily.     Yes Historical Provider, MD  glipiZIDE (GLUCOTROL XL) 5 MG 24 hr tablet Take 5 mg by mouth daily with breakfast.   Yes Historical Provider, MD  lansoprazole (PREVACID) 15 MG capsule Take 15 mg by mouth daily as needed (acid reflux).    Yes Historical Provider, MD  lisinopril (PRINIVIL,ZESTRIL) 20 MG tablet Take 20 mg by mouth at bedtime.   Yes Historical Provider, MD  loperamide (IMODIUM A-D) 2 MG tablet Take 2 mg by mouth 4 (four) times daily as needed for diarrhea or loose stools.   Yes Historical Provider, MD  meclizine (ANTIVERT) 25 MG tablet Take 12.5 mg by mouth as needed for dizziness.   Yes Historical Provider, MD  metFORMIN (GLUCOPHAGE) 1000 MG tablet Take 1,000 mg by mouth 2 (two) times daily with a meal.   Yes Historical Provider, MD  metoprolol (LOPRESSOR) 50 MG tablet Take 50 mg by mouth 2 (two) times daily.   Yes Historical Provider, MD  Multiple Vitamins-Minerals (PRESERVISION AREDS 2 PO) Take 1 tablet by mouth 2 (two) times daily.    Yes Historical Provider, MD  Polyethyl Glycol-Propyl Glycol (SYSTANE) 0.4-0.3 % SOLN Apply 2 drops to eye 2 (two) times  daily as needed (dry eye).   Yes Historical Provider, MD     Physical Exam:  Filed Vitals:   08/06/13 1458 08/06/13 1937  BP: 122/54 138/58  Pulse: 79 81  Temp: 98.3 F (36.8 C) 98.9 F (37.2 C)  TempSrc: Oral Oral  Resp: 16 16  SpO2: 96% 93%    Constitutional: Vital signs reviewed.  Patient is an elderly male lying in bed in no acute distress HEENT: No pallor, vital mucosa Chest: Clear to auscultation bilaterally, no added sounds Cardiovascular: RRR, S1 normal, S2 normal, no MRG Abdominal: Soft. Midline surgical scar,  Non-tender, non-distended, bowel sounds are normal, no masses, organomegaly, or guarding present.  GU: no CVA tenderness Ext: warm, no edema Neurological: A&O x3, non focal  Labs on Admission:  Basic Metabolic Panel:  Recent Labs Lab 08/06/13 1640  NA 132*  K 5.1  CL 94*  CO2 16*  GLUCOSE 175*  BUN 68*  CREATININE 2.70*  CALCIUM 9.7   Liver Function Tests:  Recent Labs Lab 08/06/13 1640  AST 22  ALT 20  ALKPHOS  69  BILITOT 0.4  PROT 8.5*  ALBUMIN 4.5    Recent Labs Lab 08/06/13 1640  LIPASE 36   No results found for this basename: AMMONIA,  in the last 168 hours CBC:  Recent Labs Lab 08/06/13 1640  WBC 9.2  NEUTROABS 7.5  HGB 14.5  HCT 42.2  MCV 84.4  PLT 233   Cardiac Enzymes: No results found for this basename: CKTOTAL, CKMB, CKMBINDEX, TROPONINI,  in the last 168 hours BNP: No components found with this basename: POCBNP,  CBG: No results found for this basename: GLUCAP,  in the last 168 hours  Radiological Exams on Admission: Ct Abdomen Pelvis Wo Contrast  08/06/2013   CLINICAL DATA:  Abdominal pain with vomiting  EXAM: CT ABDOMEN AND PELVIS WITHOUT CONTRAST  TECHNIQUE: Multidetector CT imaging of the abdomen and pelvis was performed following the standard protocol without intravenous contrast.  COMPARISON:  Abdominal ultrasound performed earlier today, CT abdomen/pelvis 04/11/2010  FINDINGS: Lower Chest: The lung  bases are clear. Visualized cardiac structures within normal limits for size. Extensive atherosclerotic calcifications throughout the visualized coronary arteries. No pericardial effusion. Unremarkable visualized distal thoracic esophagus.  Abdomen: Unenhanced CT was performed per clinician order. Lack of IV contrast limits sensitivity and specificity, especially for evaluation of abdominal/pelvic solid viscera. Within these limitations, unremarkable CT appearance of the stomach, duodenum, spleen, adrenal glands and pancreas. Normal hepatic contour and morphology. Multiple small 2-3 mm hypo attenuating foci throughout liver are nonspecific and too small to accurately characterize. Layering high attenuation within the gallbladder lumen favored to reflect gallbladder sludge. Small stones felt unlikely given the negative recent abdominal ultrasound.  Unremarkable appearance of the bilateral kidneys. No focal solid lesion, hydronephrosis or nephrolithiasis.  Highly redundant sigmoid colon. Scattered colonic diverticula without evidence of active inflammation. Surgical changes of prior right hemicolectomy. Entero colonic anastomosis appears widely patent. Multiple loops of dilated proximal and mid small bowel with air-fluid levels are noted in the mid abdomen. Some of the more distal loops of bowel have a flocculent appearance to their internal contents. There is no definite transition point, more a transition to normal caliber distal ileum with several acute angulations in the right hemi abdomen. This is likely obstruction related to underlying surgical adhesive disease. There is some trace inflammatory stranding around loops of bowel in the right lower quadrant with very small amounts of interloop fluid. No free air.  Pelvis: Prostatomegaly. The prostate gland measures 5 x 5.5 cm and demonstrates coarse internal calcifications. Indents the bladder base. The bladder is otherwise unremarkable. No free fluid or suspicious  adenopathy.  Bones/Soft Tissues: No acute fracture or aggressive appearing lytic or blastic osseous lesion.  Vascular: Limited evaluation in the absence of intravenous contrast. Extensive atherosclerotic vascular calcifications without aneurysmal dilatation.  IMPRESSION: 1. Small bowel obstruction likely secondary to surgical adhesive disease. No definite transition point identified. 2. Surgical changes of prior right hemicolectomy. The entero colonic anastomosis is widely patent. 3. Multiple tiny 2- 3 mm hypo attenuating foci throughout the liver are nonspecific and too small to accurately characterize. These could not be definitively identified on the prior CT scan of the abdomen and pelvis from October of 2011. 4. High attenuation material layering within the gallbladder lumen favored to reflect gallbladder sludge. 5. Colonic diverticular disease without CT evidence of active inflammation. 6. Prostatomegaly 7. Extensive atherosclerotic vascular calcifications without aneurysmal dilatation.   Electronically Signed   By: Jacqulynn Cadet M.D.   On: 08/06/2013 21:08   US Abdomen Complete  08/06/2013   CLINICAL DATA:  Right upper quadrant and right flank pain.  EXAM: ULTRASOUND ABDOMEN COMPLETE  COMPARISON:  CT abdomen/pelvis 04/11/2010  FINDINGS: Gallbladder:  No gallstones or wall thickening visualized. No sonographic Murphy sign noted.  Common bile duct:  Diameter: 4 mm.  Liver:  No focal lesion identified. Within normal limits in parenchymal echogenicity.  IVC:  No abnormality visualized.  Pancreas:  Visualized portion unremarkable.  Spleen:  Size and appearance within normal limits.  Right Kidney:  Length: 10.9 cm. Echogenicity within normal limits. No mass or hydronephrosis visualized.  Left Kidney:  Length: 11.0 cm. Echogenicity within normal limits. No mass or hydronephrosis visualized.  Abdominal aorta:  Mid to distal segment not well visualized due to abundant overlying bowel gas. Proximal segment  within normal.  Other findings:  None.  IMPRESSION: No acute findings.   Electronically Signed   By: Marin Olp M.D.   On: 08/06/2013 18:05    EKG: pending  Assessment/Plan  Principal Problem:   Small bowel obstruction due to postoperative adhesions Admit to MedSurg N.p.o. Serial abdominal exam IV hydration with normal saline -does not need NG tube at this time. Will defer to surgery consult who has been called by ED physician. Patient has history of right hemicolectomy. -Check lactic acid level. -Pain control with low dose Dilaudid -Antiemetics   Active Problems: Acute gastroenteritis Check stool for C. difficile, GI pathogen and stool culture. -Keep n.p.o. and continue IV hydration  Metabolic acidosis Secondary to gastroenteritis. Monitor with IV hydration. Check lactic acid    Acute kidney injury Likely prerenal secondary to severe dehydration. Monitor with IV fluids. Patient denies taking NSAIDs. UA unremarkable. Check  FeNa   History of CAD status post CABG and peripheral vascular disease On full dose aspirin which is on hold.    Diabetes mellitus Hold oral hypoglycemics.  Check A1C, monitor fsg. continue SSI  Hypertension Hold oral blood pressure medications. We'll place on when necessary hydralazine.  Diet:NPO  DVT prophylaxis: sq heparin   Code Status: full code Family Communication: discussed with wife and daughter at bedside Disposition Plan: Home once improved  Brandin Stetzer, [facility] Pager [phone]  Total time spent on admission :70 minutes  If 7PM-7AM, please contact night-coverage [email or web address] Password [email or web address] 08/06/2013, 10:56 PM

## 2013-08-06 NOTE — ED Notes (Addendum)
Pt c/o upper abd pain, diarrhea and sore throat x 3 days. Pt is relieved after BM.

## 2013-08-07 ENCOUNTER — Inpatient Hospital Stay (HOSPITAL_COMMUNITY): Payer: Medicare Other

## 2013-08-07 LAB — GLUCOSE, CAPILLARY
Glucose-Capillary: 130 mg/dL — ABNORMAL HIGH (ref 70–99)
Glucose-Capillary: 119 mg/dL — ABNORMAL HIGH (ref 70–99)
Glucose-Capillary: 104 mg/dL — ABNORMAL HIGH (ref 70–99)
Glucose-Capillary: 104 mg/dL — ABNORMAL HIGH (ref 70–99)
Glucose-Capillary: 83 mg/dL (ref 70–99)
Glucose-Capillary: 109 mg/dL — ABNORMAL HIGH (ref 70–99)

## 2013-08-07 LAB — BASIC METABOLIC PANEL
BUN: 50 mg/dL — ABNORMAL HIGH (ref 6–23)
CO2: 16 mEq/L — ABNORMAL LOW (ref 19–32)
Calcium: 8.6 mg/dL (ref 8.4–10.5)
Chloride: 101 mEq/L (ref 96–112)
Creatinine, Ser: 1.61 mg/dL — ABNORMAL HIGH (ref 0.50–1.35)
GFR calc Af Amer: 45 mL/min — ABNORMAL LOW (ref >90)
GFR calc non Af Amer: 39 mL/min — ABNORMAL LOW (ref >90)
Glucose, Bld: 110 mg/dL — ABNORMAL HIGH (ref 70–99)
Potassium: 4.4 mEq/L (ref 3.7–5.3)
Sodium: 135 mEq/L — ABNORMAL LOW (ref 137–147)

## 2013-08-07 LAB — CLOSTRIDIUM DIFFICILE BY PCR: Toxigenic C. Difficile by PCR: NEGATIVE

## 2013-08-07 LAB — CREATININE, URINE, RANDOM: Creatinine, Urine: 192.3 mg/dL

## 2013-08-07 LAB — CBC
HCT: 35.6 % — ABNORMAL LOW (ref 39.0–52.0)
Hemoglobin: 12.4 g/dL — ABNORMAL LOW (ref 13.0–17.0)
MCH: 29 pg (ref 26.0–34.0)
MCHC: 34.8 g/dL (ref 30.0–36.0)
MCV: 83.4 fL (ref 78.0–100.0)
Platelets: 181 10*3/uL (ref 150–400)
RBC: 4.27 MIL/uL (ref 4.22–5.81)
RDW: 13.8 % (ref 11.5–15.5)
WBC: 4 10*3/uL (ref 4.0–10.5)

## 2013-08-07 MED ORDER — SODIUM CHLORIDE 0.9 % IV SOLN
INTRAVENOUS | Status: DC
Start: 2013-08-07 — End: 2013-08-09
  Administered 2013-08-07 – 2013-08-08 (×3): via INTRAVENOUS

## 2013-08-07 MED ORDER — HYDRALAZINE HCL 20 MG/ML IJ SOLN
10.0000 mg | Freq: Four times a day (QID) | INTRAMUSCULAR | Status: DC | PRN
  Filled 2013-08-07: qty 0.5

## 2013-08-07 MED ORDER — ACETAMINOPHEN 650 MG RE SUPP: 650.0000 mg | Freq: Four times a day (QID) | RECTAL | Status: DC | PRN

## 2013-08-07 MED ORDER — ONDANSETRON HCL 4 MG/2ML IJ SOLN
4.0000 mg | Freq: Four times a day (QID) | INTRAMUSCULAR | Status: DC | PRN
Start: 2013-08-07 — End: 2013-08-07

## 2013-08-07 MED ORDER — METOPROLOL TARTRATE 1 MG/ML IV SOLN
5.0000 mg | Freq: Four times a day (QID) | INTRAVENOUS | Status: DC | PRN
  Filled 2013-08-07: qty 5

## 2013-08-07 MED ORDER — PANTOPRAZOLE SODIUM 40 MG IV SOLR
40.0000 mg | INTRAVENOUS | Status: DC
  Administered 2013-08-07: 40 mg via INTRAVENOUS
  Filled 2013-08-07 (×2): qty 40

## 2013-08-07 MED ORDER — INSULIN ASPART 100 UNIT/ML ~~LOC~~ SOLN
0.0000 [IU] | Freq: Three times a day (TID) | SUBCUTANEOUS | Status: DC
  Administered 2013-08-08 – 2013-08-09 (×3): 1 [IU] via SUBCUTANEOUS

## 2013-08-07 MED ORDER — ACETAMINOPHEN 325 MG PO TABS
650.0000 mg | ORAL_TABLET | Freq: Four times a day (QID) | ORAL | Status: DC | PRN
Start: 2013-08-07 — End: 2013-08-09

## 2013-08-07 MED ORDER — HYDROMORPHONE HCL PF 1 MG/ML IJ SOLN
1.0000 mg | INTRAMUSCULAR | Status: DC | PRN
  Administered 2013-08-07: 1 mg via INTRAVENOUS
  Filled 2013-08-07: qty 1

## 2013-08-07 MED ORDER — INSULIN ASPART 100 UNIT/ML ~~LOC~~ SOLN: 0.0000 [IU] | Freq: Every day | SUBCUTANEOUS | Status: DC

## 2013-08-07 MED ORDER — HEPARIN SODIUM (PORCINE) 5000 UNIT/ML IJ SOLN
5000.0000 [IU] | Freq: Three times a day (TID) | INTRAMUSCULAR | Status: DC
  Administered 2013-08-07 – 2013-08-09 (×7): 5000 [IU] via SUBCUTANEOUS
  Filled 2013-08-07 (×10): qty 1

## 2013-08-07 MED ORDER — INSULIN ASPART 100 UNIT/ML ~~LOC~~ SOLN
0.0000 [IU] | SUBCUTANEOUS | Status: DC
  Administered 2013-08-07: 1 [IU] via SUBCUTANEOUS

## 2013-08-07 NOTE — Progress Notes (Signed)
Clayton Harrison [id number] 1930/11/15  CARE TEAM:  PCP: [name], MD  Outpatient Care Team: Patient Care Team: Hali Marry, MD as PCP - General (Family Medicine) Lelon Perla, MD (Cardiology) Rosetta Posner, MD as Attending Physician (Vascular Surgery) Simona Huh, MD as Consulting Physician (Dermatology) Hayden Pedro, MD as Attending Physician (Ophthalmology) Irene Shipper, MD as Attending Physician (Gastroenterology) Irine Seal, MD as Attending Physician (Urology) Johna Sheriff, MD as Attending Physician (Ophthalmology) Rolm Bookbinder, MD as Consulting Physician (General Surgery)  Inpatient Treatment Team: Treatment Team: Attending Provider: Louellen Molder, MD; Consulting Physician: Nolon Nations, MD; Rounding Team: Fatima Blank, MD; Technician: Loma Sousa Magbitang, NT   Subjective:  Sitting at the side of the bed.  Wife in the room.  Feels a little better.  No nausea or vomiting.  No fevers or chills.  No diarrhea.  Objective:  Vital signs:  Filed Vitals:   08/07/13 0015 08/07/13 0050 08/07/13 0052 08/07/13 0500  BP: 158/67 146/70  135/73  Pulse: 83 94  92  Temp: 98.4 F (36.9 C) 97.4 F (36.3 C)  99.6 F (37.6 C)  TempSrc: Oral Oral  Oral  Resp: _0 Height:   _1  (1.803 m)   Weight:   190 lb (86.183 kg)   SpO2: 95% 97%  94%    Last BM Date: 08/06/13  Intake/Output   Yesterday:  02/13 0701 - 02/14 0700 In: 679.2 [I.V.:679.2] Out: 250 [Urine:250] This shift:     Bowel function:  Flatus: y  BM: n  Drain: n/a  Physical Exam:  General: Pt awake/alert/oriented x4 in no acute distress Eyes: PERRL, normal EOM.  Sclera clear.  No icterus Neuro: CN II-XII intact w/o focal sensory/motor deficits. Lymph: No head/neck/groin lymphadenopathy Psych:  No delerium/psychosis/paranoia HENT: Normocephalic, Mucus membranes moist.  No thrush Neck: Supple, No tracheal deviation Chest: No chest wall pain w good  excursion CV:  Pulses intact.  Regular rhythm MS: Normal AROM mjr joints.  No obvious deformity Abdomen: Soft.  Mildly distended.  Nontender.  No evidence of peritonitis.  No incarcerated hernias. Ext:  SCDs BLE.  No mjr edema.  No cyanosis Skin: No petechiae / purpura   Problem List:   Principal Problem:   Acute diarrhea Active Problems:   DM (diabetes mellitus) with peripheral vascular complication   HYPERLIPIDEMIA   HYPERTENSION   CORONARY ARTERY DISEASE   CEREBROVASCULAR DISEASE   PERIPHERAL VASCULAR DISEASE   GERD   BPH (benign prostatic hyperplasia)   H/O right hemicolectomy   Acute kidney injury   Metabolic acidosis   HOH (hard of hearing)   Small bowel obstruction   Assessment  Clayton Harrison  80 y.o. male       Probable diarrhea with ileus stabilizing.  Again small bowel obstruction much less likely  Plan:  IV fluids.  Followup on renal failure  Consider starting by mouth liquids  Followup on x-rays of abdomen to see if contrast;, arguing against a significant obstruction.  Bowel regimen.  If markedly worsens with distention/nausea/vomiting, Place nasogastric tube.  -VTE prophylaxis- SCDs, etc  -mobilize as tolerated to help recovery  The patient is stable.  There is no evidence of peritonitis, acute abdomen, nor shock.  There is no strong evidence of failure of improvement nor decline with current non-operative management.  There is no need for surgery at the present moment.  We will continue to follow.   Adin Hector, M.D., F.A.C.S. Gastrointestinal  and Minimally Invasive Surgery [facility] [address], [place], [place] [address] [phone] Main / Paging   08/07/2013   Results:   Labs: Results for orders placed during the hospital encounter of 08/06/13 (from the past 48 hour(s))  CBC WITH DIFFERENTIAL     Status: Abnormal   Collection Time    08/06/13  4:40 PM      Result Value Ref Range    WBC 9.2  4.0 - 10.5 K/uL   RBC 5.00  4.22 - 5.81 MIL/uL   Hemoglobin 14.5  13.0 - 17.0 g/dL   HCT 42.2  39.0 - 52.0 %   MCV 84.4  78.0 - 100.0 fL   MCH 29.0  26.0 - 34.0 pg   MCHC 34.4  30.0 - 36.0 g/dL   RDW 14.0  11.5 - 15.5 %   Platelets 233  150 - 400 K/uL   Neutrophils Relative % 82 (*) 43 - 77 %   Neutro Abs 7.5  1.7 - 7.7 K/uL   Lymphocytes Relative 10 (*) 12 - 46 %   Lymphs Abs 0.9  0.7 - 4.0 K/uL   Monocytes Relative 9  3 - 12 %   Monocytes Absolute 0.8  0.1 - 1.0 K/uL   Eosinophils Relative 0  0 - 5 %   Eosinophils Absolute 0.0  0.0 - 0.7 K/uL   Basophils Relative 0  0 - 1 %   Basophils Absolute 0.0  0.0 - 0.1 K/uL  COMPREHENSIVE METABOLIC PANEL     Status: Abnormal   Collection Time    08/06/13  4:40 PM      Result Value Ref Range   Sodium 132 (*) 137 - 147 mEq/L   Potassium 5.1  3.7 - 5.3 mEq/L   Chloride 94 (*) 96 - 112 mEq/L   CO2 16 (*) 19 - 32 mEq/L   Glucose, Bld 175 (*) 70 - 99 mg/dL   BUN 68 (*) 6 - 23 mg/dL   Creatinine, Ser 2.70 (*) 0.50 - 1.35 mg/dL   Calcium 9.7  8.4 - 10.5 mg/dL   Total Protein 8.5 (*) 6.0 - 8.3 g/dL   Albumin 4.5  3.5 - 5.2 g/dL   AST 22  0 - 37 U/L   ALT 20  0 - 53 U/L   Alkaline Phosphatase 69  39 - 117 U/L   Total Bilirubin 0.4  0.3 - 1.2 mg/dL   GFR calc non Af Amer 21 (*) >90 mL/min   GFR calc Af Amer 24 (*) >90 mL/min   Comment: (NOTE)     The eGFR has been calculated using the CKD EPI equation.     This calculation has not been validated in all clinical situations.     eGFR's persistently <90 mL/min signify possible Chronic Kidney     Disease.  LIPASE, BLOOD     Status: None   Collection Time    08/06/13  4:40 PM      Result Value Ref Range   Lipase 36  11 - 59 U/L  URINALYSIS, ROUTINE W REFLEX MICROSCOPIC     Status: Abnormal   Collection Time    08/06/13  6:07 PM      Result Value Ref Range   Color, Urine YELLOW  YELLOW   APPearance HAZY (*) CLEAR   Specific Gravity, Urine 1.019  1.005 - 1.030   pH 5.0  5.0 -  8.0   Glucose, UA NEGATIVE  NEGATIVE mg/dL  Hgb urine dipstick NEGATIVE  NEGATIVE   Bilirubin Urine NEGATIVE  NEGATIVE   Ketones, ur NEGATIVE  NEGATIVE mg/dL   Protein, ur 30 (*) NEGATIVE mg/dL   Urobilinogen, UA 0.2  0.0 - 1.0 mg/dL   Nitrite NEGATIVE  NEGATIVE   Leukocytes, UA NEGATIVE  NEGATIVE  URINE MICROSCOPIC-ADD ON     Status: Abnormal   Collection Time    08/06/13  6:07 PM      Result Value Ref Range   WBC, UA 0-2  <3 WBC/hpf   Bacteria, UA RARE  RARE   Casts HYALINE CASTS (*) NEGATIVE   Comment: GRANULAR CAST   Urine-Other AMORPHOUS URATES/PHOSPHATES    CREATININE, URINE, RANDOM     Status: None   Collection Time    08/06/13  6:07 PM      Result Value Ref Range   Creatinine, Urine 192.3     Comment: Performed at [facility]  LACTIC ACID, PLASMA     Status: None   Collection Time    08/06/13 11:17 PM      Result Value Ref Range   Lactic Acid, Venous 1.8  0.5 - 2.2 mmol/L  GLUCOSE, CAPILLARY     Status: Abnormal   Collection Time    08/07/13  1:51 AM      Result Value Ref Range   Glucose-Capillary 130 (*) 70 - 99 mg/dL  GLUCOSE, CAPILLARY     Status: Abnormal   Collection Time    08/07/13  4:49 AM      Result Value Ref Range   Glucose-Capillary 119 (*) 70 - 99 mg/dL  BASIC METABOLIC PANEL     Status: Abnormal (Preliminary result)   Collection Time    08/07/13  5:41 AM      Result Value Ref Range   Sodium 135 (*) 137 - 147 mEq/L   Potassium 4.4  3.7 - 5.3 mEq/L   Chloride PENDING  96 - 112 mEq/L   CO2 16 (*) 19 - 32 mEq/L   Glucose, Bld 110 (*) 70 - 99 mg/dL   BUN 50 (*) 6 - 23 mg/dL   Creatinine, Ser PENDING  0.50 - 1.35 mg/dL   Calcium 8.6  8.4 - 10.5 mg/dL   GFR calc non Af Amer PENDING  >90 mL/min   GFR calc Af Amer PENDING  >90 mL/min  CBC     Status: Abnormal   Collection Time    08/07/13  5:41 AM      Result Value Ref Range   WBC 4.0  4.0 - 10.5 K/uL   RBC 4.27  4.22 - 5.81 MIL/uL   Hemoglobin 12.4 (*) 13.0 - 17.0 g/dL   HCT 35.6 (*)  39.0 - 52.0 %   MCV 83.4  78.0 - 100.0 fL   MCH 29.0  26.0 - 34.0 pg   MCHC 34.8  30.0 - 36.0 g/dL   RDW 13.8  11.5 - 15.5 %   Platelets 181  150 - 400 K/uL  GLUCOSE, CAPILLARY     Status: Abnormal   Collection Time    08/07/13  8:14 AM      Result Value Ref Range   Glucose-Capillary 104 (*) 70 - 99 mg/dL    Imaging / Studies: Ct Abdomen Pelvis Wo Contrast  08/06/2013   CLINICAL DATA:  Abdominal pain with vomiting  EXAM: CT ABDOMEN AND PELVIS WITHOUT CONTRAST  TECHNIQUE: Multidetector CT imaging of the abdomen and pelvis was performed following the standard protocol without intravenous  contrast.  COMPARISON:  Abdominal ultrasound performed earlier today, CT abdomen/pelvis 04/11/2010  FINDINGS: Lower Chest: The lung bases are clear. Visualized cardiac structures within normal limits for size. Extensive atherosclerotic calcifications throughout the visualized coronary arteries. No pericardial effusion. Unremarkable visualized distal thoracic esophagus.  Abdomen: Unenhanced CT was performed per clinician order. Lack of IV contrast limits sensitivity and specificity, especially for evaluation of abdominal/pelvic solid viscera. Within these limitations, unremarkable CT appearance of the stomach, duodenum, spleen, adrenal glands and pancreas. Normal hepatic contour and morphology. Multiple small 2-3 mm hypo attenuating foci throughout liver are nonspecific and too small to accurately characterize. Layering high attenuation within the gallbladder lumen favored to reflect gallbladder sludge. Small stones felt unlikely given the negative recent abdominal ultrasound.  Unremarkable appearance of the bilateral kidneys. No focal solid lesion, hydronephrosis or nephrolithiasis.  Highly redundant sigmoid colon. Scattered colonic diverticula without evidence of active inflammation. Surgical changes of prior right hemicolectomy. Entero colonic anastomosis appears widely patent. Multiple loops of dilated proximal and  mid small bowel with air-fluid levels are noted in the mid abdomen. Some of the more distal loops of bowel have a flocculent appearance to their internal contents. There is no definite transition point, more a transition to normal caliber distal ileum with several acute angulations in the right hemi abdomen. This is likely obstruction related to underlying surgical adhesive disease. There is some trace inflammatory stranding around loops of bowel in the right lower quadrant with very small amounts of interloop fluid. No free air.  Pelvis: Prostatomegaly. The prostate gland measures 5 x 5.5 cm and demonstrates coarse internal calcifications. Indents the bladder base. The bladder is otherwise unremarkable. No free fluid or suspicious adenopathy.  Bones/Soft Tissues: No acute fracture or aggressive appearing lytic or blastic osseous lesion.  Vascular: Limited evaluation in the absence of intravenous contrast. Extensive atherosclerotic vascular calcifications without aneurysmal dilatation.  IMPRESSION: 1. Small bowel obstruction likely secondary to surgical adhesive disease. No definite transition point identified. 2. Surgical changes of prior right hemicolectomy. The entero colonic anastomosis is widely patent. 3. Multiple tiny 2- 3 mm hypo attenuating foci throughout the liver are nonspecific and too small to accurately characterize. These could not be definitively identified on the prior CT scan of the abdomen and pelvis from October of 2011. 4. High attenuation material layering within the gallbladder lumen favored to reflect gallbladder sludge. 5. Colonic diverticular disease without CT evidence of active inflammation. 6. Prostatomegaly 7. Extensive atherosclerotic vascular calcifications without aneurysmal dilatation.   Electronically Signed   By: Jacqulynn Cadet M.D.   On: 08/06/2013 21:08   US Abdomen Complete  08/06/2013   CLINICAL DATA:  Right upper quadrant and right flank pain.  EXAM: ULTRASOUND ABDOMEN  COMPLETE  COMPARISON:  CT abdomen/pelvis 04/11/2010  FINDINGS: Gallbladder:  No gallstones or wall thickening visualized. No sonographic Murphy sign noted.  Common bile duct:  Diameter: 4 mm.  Liver:  No focal lesion identified. Within normal limits in parenchymal echogenicity.  IVC:  No abnormality visualized.  Pancreas:  Visualized portion unremarkable.  Spleen:  Size and appearance within normal limits.  Right Kidney:  Length: 10.9 cm. Echogenicity within normal limits. No mass or hydronephrosis visualized.  Left Kidney:  Length: 11.0 cm. Echogenicity within normal limits. No mass or hydronephrosis visualized.  Abdominal aorta:  Mid to distal segment not well visualized due to abundant overlying bowel gas. Proximal segment within normal.  Other findings:  None.  IMPRESSION: No acute findings.   Electronically Signed  By: Marin Olp M.D.   On: 08/06/2013 18:05   Dg Abd Acute W/chest  08/07/2013   CLINICAL DATA:  Upper abdominal pain, diarrhea, sore throat  EXAM: ACUTE ABDOMEN SERIES (ABDOMEN 2 VIEW & CHEST 1 VIEW)  COMPARISON:  CT abdomen/ pelvis 08/06/2013  FINDINGS: Cardiac and mediastinal contours within normal limits. Patient is status post median sternotomy with evidence of prior CABG. Mild bibasilar atelectasis. Trace chronic bronchitic changes. Otherwise, the lungs are clear. Surgical clips noted in the soft tissues of the right neck. No acute osseous abnormality.  Numerous loops of air-filled and dilated small bowel throughout the mid abdomen remain concerning for small bowel obstruction. Maximum small bowel diameter 5 cm by conventional x-ray which may be an overestimate. There are multiple small air-fluid levels on the upright radiograph. No free air.  IMPRESSION: Persistent gaseous distension of small bowel loops with multiple air-fluid levels remains concerning for small bowel obstruction. Enteritis with ileus could appear similar.   Electronically Signed   By: Jacqulynn Cadet M.D.   On:  08/07/2013 02:02    Medications / Allergies: per chart  Antibiotics: Anti-infectives   None       Note: This dictation was prepared with Dragon/digital dictation along with [employer]. Any transcriptional errors that result from this process are unintentional.

## 2013-08-07 NOTE — Progress Notes (Signed)
[facility] PROGRESS NOTE  Clayton Harrison [id number] DOB: May 16, 1931 DOA: 08/06/2013 PCP: [name], MD  Assessment/Plan  Partial SBO due to postoperative adhesions with or without gastroenteritis, + flatus and diarrhea -  CLD -  Continue IVF -  Continue pain medication and anti-emetics -  F/u c. Diff -  F/u stool cx -  F/u GI path panel  nongap metabolic acidosis secondary to diarrhea (bicarb losses) -  Stable. -  Continue hydration  AKI due to dehydration, baseline creatinine of 1 -  FENa pending -  Improved with hydration  DM -  Hold oral meds -  Continue SSI -  A1c  HTN, blood pressure wnl -  Hold BB, ACEI, norvasc -  Add prn metoprolol for BP or HR (at risk for rebound tachycardia)    Atherosclerosis of aorta noted on CT  BPH noted on CT.  asymptomatic  Diet:  CLD Access:  PIV IVF:  yes Proph:  heparin  Code Status: full Family Communication: patient and wife Disposition Plan: pending improvement in diarrhea and kidney function, to home   Consultants:  Gen Surg  Procedures:  Abd [place]  CT ab/p  KUB  Antibiotics:  none   HPI/Subjective:  States he is having liquidy stools still and he has provided a sample to Therapist, sports.  Persistent abdominal pains and cramping.  Denies SOb, chest pain, nausea, vomiting    Objective: Filed Vitals:   08/07/13 0015 08/07/13 0050 08/07/13 0052 08/07/13 0500  BP: 158/67 146/70  135/73  Pulse: 83 94  92  Temp: 98.4 F (36.9 C) 97.4 F (36.3 C)  99.6 F (37.6 C)  TempSrc: Oral Oral  Oral  Resp: 16 18  18   Height:   5\' 11"  (1.803 m)   Weight:   86.183 kg (190 lb)   SpO2: 95% 97%  94%    Intake/Output Summary (Last 24 hours) at 08/07/13 1535 Last data filed at 08/07/13 0600  Gross per 24 hour  Intake 679.17 ml  Output    250 ml  Net 429.17 ml   Filed Weights   08/07/13 0052  Weight: 86.183 kg (190 lb)    Exam:   General:  WM, No acute distress  HEENT:  NCAT, MMM  Cardiovascular:   RRR, nl S1, S2 no mrg, 2+ pulses, warm extremities  Respiratory:  CTAB, no increased WOB  Abdomen:   Hyperactive BS, soft, nondistended, mild TTP diffusely without rebound or guarding  MSK:   Normal tone and bulk, no LEE  Neuro:  Grossly intact  Data Reviewed: Basic Metabolic Panel:  Recent Labs Lab 08/06/13 1640 08/07/13 0541  NA 132* 135*  K 5.1 4.4  CL 94* 101  CO2 16* 16*  GLUCOSE 175* 110*  BUN 68* 50*  CREATININE 2.70* 1.61*  CALCIUM 9.7 8.6   Liver Function Tests:  Recent Labs Lab 08/06/13 1640  AST 22  ALT 20  ALKPHOS 69  BILITOT 0.4  PROT 8.5*  ALBUMIN 4.5    Recent Labs Lab 08/06/13 1640  LIPASE 36   No results found for this basename: AMMONIA,  in the last 168 hours CBC:  Recent Labs Lab 08/06/13 1640 08/07/13 0541  WBC 9.2 4.0  NEUTROABS 7.5  --   HGB 14.5 12.4*  HCT 42.2 35.6*  MCV 84.4 83.4  PLT 233 181   Cardiac Enzymes: No results found for this basename: CKTOTAL, CKMB, CKMBINDEX, TROPONINI,  in the last 168 hours BNP (last 3 results) No results found for this  basename: PROBNP,  in the last 8760 hours CBG:  Recent Labs Lab 08/07/13 0151 08/07/13 0449 08/07/13 0814 08/07/13 1212  GLUCAP 130* 119* 104* 104*    No results found for this or any previous visit (from the past 240 hour(s)).   Studies: Ct Abdomen Pelvis Wo Contrast  08/06/2013   CLINICAL DATA:  Abdominal pain with vomiting  EXAM: CT ABDOMEN AND PELVIS WITHOUT CONTRAST  TECHNIQUE: Multidetector CT imaging of the abdomen and pelvis was performed following the standard protocol without intravenous contrast.  COMPARISON:  Abdominal ultrasound performed earlier today, CT abdomen/pelvis 04/11/2010  FINDINGS: Lower Chest: The lung bases are clear. Visualized cardiac structures within normal limits for size. Extensive atherosclerotic calcifications throughout the visualized coronary arteries. No pericardial effusion. Unremarkable visualized distal thoracic esophagus.   Abdomen: Unenhanced CT was performed per clinician order. Lack of IV contrast limits sensitivity and specificity, especially for evaluation of abdominal/pelvic solid viscera. Within these limitations, unremarkable CT appearance of the stomach, duodenum, spleen, adrenal glands and pancreas. Normal hepatic contour and morphology. Multiple small 2-3 mm hypo attenuating foci throughout liver are nonspecific and too small to accurately characterize. Layering high attenuation within the gallbladder lumen favored to reflect gallbladder sludge. Small stones felt unlikely given the negative recent abdominal ultrasound.  Unremarkable appearance of the bilateral kidneys. No focal solid lesion, hydronephrosis or nephrolithiasis.  Highly redundant sigmoid colon. Scattered colonic diverticula without evidence of active inflammation. Surgical changes of prior right hemicolectomy. Entero colonic anastomosis appears widely patent. Multiple loops of dilated proximal and mid small bowel with air-fluid levels are noted in the mid abdomen. Some of the more distal loops of bowel have a flocculent appearance to their internal contents. There is no definite transition point, more a transition to normal caliber distal ileum with several acute angulations in the right hemi abdomen. This is likely obstruction related to underlying surgical adhesive disease. There is some trace inflammatory stranding around loops of bowel in the right lower quadrant with very small amounts of interloop fluid. No free air.  Pelvis: Prostatomegaly. The prostate gland measures 5 x 5.5 cm and demonstrates coarse internal calcifications. Indents the bladder base. The bladder is otherwise unremarkable. No free fluid or suspicious adenopathy.  Bones/Soft Tissues: No acute fracture or aggressive appearing lytic or blastic osseous lesion.  Vascular: Limited evaluation in the absence of intravenous contrast. Extensive atherosclerotic vascular calcifications without  aneurysmal dilatation.  IMPRESSION: 1. Small bowel obstruction likely secondary to surgical adhesive disease. No definite transition point identified. 2. Surgical changes of prior right hemicolectomy. The entero colonic anastomosis is widely patent. 3. Multiple tiny 2- 3 mm hypo attenuating foci throughout the liver are nonspecific and too small to accurately characterize. These could not be definitively identified on the prior CT scan of the abdomen and pelvis from October of 2011. 4. High attenuation material layering within the gallbladder lumen favored to reflect gallbladder sludge. 5. Colonic diverticular disease without CT evidence of active inflammation. 6. Prostatomegaly 7. Extensive atherosclerotic vascular calcifications without aneurysmal dilatation.   Electronically Signed   By: Jacqulynn Cadet M.D.   On: 08/06/2013 21:08   US Abdomen Complete  08/06/2013   CLINICAL DATA:  Right upper quadrant and right flank pain.  EXAM: ULTRASOUND ABDOMEN COMPLETE  COMPARISON:  CT abdomen/pelvis 04/11/2010  FINDINGS: Gallbladder:  No gallstones or wall thickening visualized. No sonographic Murphy sign noted.  Common bile duct:  Diameter: 4 mm.  Liver:  No focal lesion identified. Within normal limits in parenchymal echogenicity.  IVC:  No abnormality visualized.  Pancreas:  Visualized portion unremarkable.  Spleen:  Size and appearance within normal limits.  Right Kidney:  Length: 10.9 cm. Echogenicity within normal limits. No mass or hydronephrosis visualized.  Left Kidney:  Length: 11.0 cm. Echogenicity within normal limits. No mass or hydronephrosis visualized.  Abdominal aorta:  Mid to distal segment not well visualized due to abundant overlying bowel gas. Proximal segment within normal.  Other findings:  None.  IMPRESSION: No acute findings.   Electronically Signed   By: Marin Olp M.D.   On: 08/06/2013 18:05   Dg Abd Acute W/chest  08/07/2013   CLINICAL DATA:  Upper abdominal pain, diarrhea, sore  throat  EXAM: ACUTE ABDOMEN SERIES (ABDOMEN 2 VIEW & CHEST 1 VIEW)  COMPARISON:  CT abdomen/ pelvis 08/06/2013  FINDINGS: Cardiac and mediastinal contours within normal limits. Patient is status post median sternotomy with evidence of prior CABG. Mild bibasilar atelectasis. Trace chronic bronchitic changes. Otherwise, the lungs are clear. Surgical clips noted in the soft tissues of the right neck. No acute osseous abnormality.  Numerous loops of air-filled and dilated small bowel throughout the mid abdomen remain concerning for small bowel obstruction. Maximum small bowel diameter 5 cm by conventional x-ray which may be an overestimate. There are multiple small air-fluid levels on the upright radiograph. No free air.  IMPRESSION: Persistent gaseous distension of small bowel loops with multiple air-fluid levels remains concerning for small bowel obstruction. Enteritis with ileus could appear similar.   Electronically Signed   By: Jacqulynn Cadet M.D.   On: 08/07/2013 02:02    Scheduled Meds: . heparin  5,000 Units Subcutaneous 3 times per day  . insulin aspart  0-9 Units Subcutaneous 6 times per day  . lip balm  1 application Topical BID  . pantoprazole (PROTONIX) IV  40 mg Intravenous Q24H  . saccharomyces boulardii  250 mg Oral BID   Continuous Infusions: . sodium chloride 150 mL/hr at 08/07/13 1247  . sodium chloride 100 mL/hr at 08/07/13 1245    Principal Problem:   Acute diarrhea Active Problems:   DM (diabetes mellitus) with peripheral vascular complication   HYPERLIPIDEMIA   HYPERTENSION   CORONARY ARTERY DISEASE   CEREBROVASCULAR DISEASE   PERIPHERAL VASCULAR DISEASE   GERD   BPH (benign prostatic hyperplasia)   H/O right hemicolectomy   Acute kidney injury   Metabolic acidosis   HOH (hard of hearing)   Small bowel obstruction    Time spent: 30 min    Kimbery Harwood, [facility] Pager [phone]. If 7PM-7AM, please contact night-coverage at [email or web address],  password [facility] 08/07/2013, 3:35 PM  LOS: 1 day

## 2013-08-08 DIAGNOSIS — E1159 Type 2 diabetes mellitus with other circulatory complications: Secondary | ICD-10-CM

## 2013-08-08 LAB — CBC
HCT: 34.4 % — ABNORMAL LOW (ref 39.0–52.0)
Hemoglobin: 11.6 g/dL — ABNORMAL LOW (ref 13.0–17.0)
MCH: 28.6 pg (ref 26.0–34.0)
MCHC: 33.7 g/dL (ref 30.0–36.0)
MCV: 84.7 fL (ref 78.0–100.0)
Platelets: 187 10*3/uL (ref 150–400)
RBC: 4.06 MIL/uL — ABNORMAL LOW (ref 4.22–5.81)
RDW: 13.7 % (ref 11.5–15.5)
WBC: 4.4 10*3/uL (ref 4.0–10.5)

## 2013-08-08 LAB — BASIC METABOLIC PANEL
BUN: 29 mg/dL — ABNORMAL HIGH (ref 6–23)
CO2: 21 mEq/L (ref 19–32)
Calcium: 8.7 mg/dL (ref 8.4–10.5)
Chloride: 103 mEq/L (ref 96–112)
Creatinine, Ser: 1.37 mg/dL — ABNORMAL HIGH (ref 0.50–1.35)
GFR calc Af Amer: 55 mL/min — ABNORMAL LOW (ref >90)
GFR calc non Af Amer: 47 mL/min — ABNORMAL LOW (ref >90)
Glucose, Bld: 100 mg/dL — ABNORMAL HIGH (ref 70–99)
Potassium: 4 mEq/L (ref 3.7–5.3)
Sodium: 137 mEq/L (ref 137–147)

## 2013-08-08 LAB — GLUCOSE, CAPILLARY
Glucose-Capillary: 95 mg/dL (ref 70–99)
Glucose-Capillary: 148 mg/dL — ABNORMAL HIGH (ref 70–99)
Glucose-Capillary: 143 mg/dL — ABNORMAL HIGH (ref 70–99)
Glucose-Capillary: 156 mg/dL — ABNORMAL HIGH (ref 70–99)

## 2013-08-08 LAB — SODIUM, URINE, RANDOM: Sodium, Ur: 20 mEq/L

## 2013-08-08 MED ORDER — AMLODIPINE BESYLATE 10 MG PO TABS
10.0000 mg | ORAL_TABLET | Freq: Every day | ORAL | Status: DC
  Administered 2013-08-08 – 2013-08-09 (×2): 10 mg via ORAL
  Filled 2013-08-08 (×2): qty 1

## 2013-08-08 MED ORDER — METOPROLOL TARTRATE 50 MG PO TABS
50.0000 mg | ORAL_TABLET | Freq: Two times a day (BID) | ORAL | Status: DC
  Administered 2013-08-08 – 2013-08-09 (×3): 50 mg via ORAL
  Filled 2013-08-08 (×4): qty 1

## 2013-08-08 MED ORDER — HYDROMORPHONE HCL PF 1 MG/ML IJ SOLN: 0.5000 mg | INTRAMUSCULAR | Status: DC | PRN

## 2013-08-08 MED ORDER — PANTOPRAZOLE SODIUM 40 MG PO TBEC
40.0000 mg | DELAYED_RELEASE_TABLET | Freq: Every day | ORAL | Status: DC
  Administered 2013-08-08 – 2013-08-09 (×2): 40 mg via ORAL
  Filled 2013-08-08 (×2): qty 1

## 2013-08-08 NOTE — Progress Notes (Signed)
Clayton Harrison [id number] 01-19-1931  CARE TEAM:  PCP: [name], MD  Outpatient Care Team: Patient Care Team: Hali Marry, MD as PCP - General (Family Medicine) Lelon Perla, MD (Cardiology) Rosetta Posner, MD as Attending Physician (Vascular Surgery) Simona Huh, MD as Consulting Physician (Dermatology) Hayden Pedro, MD as Attending Physician (Ophthalmology) Irene Shipper, MD as Attending Physician (Gastroenterology) Irine Seal, MD as Attending Physician (Urology) Johna Sheriff, MD as Attending Physician (Ophthalmology) Rolm Bookbinder, MD as Consulting Physician (General Surgery)  Inpatient Treatment Team: Treatment Team: Attending Provider: Janece Canterbury, MD; Consulting Physician: Nolon Nations, MD; Rounding Team: Fatima Blank, MD; Technician: Leandrew Koyanagi, NT; Registered Nurse: Earline Mayotte II, RN   Subjective:  Sitting at the side of the bed.  Wife in the room.  Feels even better.  No nausea or vomiting.  No fevers or chills.  They think this started after eating a corn beef sandwich.  "I will never touched that again"  No diarrhea.  Objective:  Vital signs:  Filed Vitals:   08/07/13 0500 08/07/13 1616 08/07/13 2100 08/08/13 0550  BP: 135/73 151/71 132/63 144/70  Pulse: 92 88 83 75  Temp: 99.6 F (37.6 C) 98.1 F (36.7 C) 99.2 F (37.3 C) 98.5 F (36.9 C)  TempSrc: Oral Oral Oral Oral  Resp: _0 Height:      Weight:      SpO2: 94% 94% 96% 96%    Last BM Date: 08/06/13  Intake/Output   Yesterday:  02/14 0701 - 02/15 0700 In: 240 [P.O.:240] Out: -  This shift:     Bowel function:  Flatus: y  BM: n  Drain: n/a  Physical Exam:  General: Pt awake/alert/oriented x4 in no acute distress Eyes: PERRL, normal EOM.  Sclera clear.  No icterus Neuro: CN II-XII intact w/o focal sensory/motor deficits. Lymph: No head/neck/groin lymphadenopathy Psych:  No  delerium/psychosis/paranoia HENT: Normocephalic, Mucus membranes moist.  No thrush Neck: Supple, No tracheal deviation Chest: No chest wall pain w good excursion CV:  Pulses intact.  Regular rhythm MS: Normal AROM mjr joints.  No obvious deformity Abdomen: Soft.  Mildly distended but improved.  Nontender.  No evidence of peritonitis.  No incarcerated hernias. Ext:  SCDs BLE.  No mjr edema.  No cyanosis Skin: No petechiae / purpura   Problem List:   Principal Problem:   Acute diarrhea Active Problems:   DM (diabetes mellitus) with peripheral vascular complication   HYPERLIPIDEMIA   HYPERTENSION   CORONARY ARTERY DISEASE   CEREBROVASCULAR DISEASE   PERIPHERAL VASCULAR DISEASE   GERD   BPH (benign prostatic hyperplasia)   H/O right hemicolectomy   Acute kidney injury   Metabolic acidosis   HOH (hard of hearing)   Partial small bowel obstruction   Assessment  DUVID SMALLS  80 y.o. male       Probable diarrhea with ileus stabilizing.  Again small bowel obstruction much less likely  Workup for infectious diarrhea and negative so far  Plan:  Advanced to full liquids, solids as tolerated  Wean IV fluids as PO improves  Followup on renal failure - Following creatinine in improving urine output hopeful signs  Bowel regimen.  Start with probiotics.  I recommended some type of fiber supplement everyday rest of his life.  He claims to have one-2 tablets a day normally.  If markedly worsens with distention/nausea/vomiting, Place nasogastric tube.  -VTE prophylaxis- SCDs, etc  -mobilize as tolerated  to help recovery  The patient is stable.  There is no evidence of peritonitis, acute abdomen, nor shock.  There is no strong evidence of failure of improvement nor decline with current non-operative management.  There is no need for surgery at the present moment.  We will continue to follow.   Adin Hector, M.D., F.A.C.S. Gastrointestinal and Minimally Invasive  Sur[facility] [address] [place], [place] [address] [phone] Main / Paging   08/08/2013   Results:   Labs: Results for orders placed during the hospital encounter of 08/06/13 (from the past 48 hour(s))  CBC WITH DIFFERENTIAL     Status: Abnormal   Collection Time    08/06/13  4:40 PM      Result Value Ref Range   WBC 9.2  4.0 - 10.5 K/uL   RBC 5.00  4.22 - 5.81 MIL/uL   Hemoglobin 14.5  13.0 - 17.0 g/dL   HCT 42.2  39.0 - 52.0 %   MCV 84.4  78.0 - 100.0 fL   MCH 29.0  26.0 - 34.0 pg   MCHC 34.4  30.0 - 36.0 g/dL   RDW 14.0  11.5 - 15.5 %   Platelets 233  150 - 400 K/uL   Neutrophils Relative % 82 (*) 43 - 77 %   Neutro Abs 7.5  1.7 - 7.7 K/uL   Lymphocytes Relative 10 (*) 12 - 46 %   Lymphs Abs 0.9  0.7 - 4.0 K/uL   Monocytes Relative 9  3 - 12 %   Monocytes Absolute 0.8  0.1 - 1.0 K/uL   Eosinophils Relative 0  0 - 5 %   Eosinophils Absolute 0.0  0.0 - 0.7 K/uL   Basophils Relative 0  0 - 1 %   Basophils Absolute 0.0  0.0 - 0.1 K/uL  COMPREHENSIVE METABOLIC PANEL     Status: Abnormal   Collection Time    08/06/13  4:40 PM      Result Value Ref Range   Sodium 132 (*) 137 - 147 mEq/L   Potassium 5.1  3.7 - 5.3 mEq/L   Chloride 94 (*) 96 - 112 mEq/L   CO2 16 (*) 19 - 32 mEq/L   Glucose, Bld 175 (*) 70 - 99 mg/dL   BUN 68 (*) 6 - 23 mg/dL   Creatinine, Ser 2.70 (*) 0.50 - 1.35 mg/dL   Calcium 9.7  8.4 - 10.5 mg/dL   Total Protein 8.5 (*) 6.0 - 8.3 g/dL   Albumin 4.5  3.5 - 5.2 g/dL   AST 22  0 - 37 U/L   ALT 20  0 - 53 U/L   Alkaline Phosphatase 69  39 - 117 U/L   Total Bilirubin 0.4  0.3 - 1.2 mg/dL   GFR calc non Af Amer 21 (*) >90 mL/min   GFR calc Af Amer 24 (*) >90 mL/min   Comment: (NOTE)     The eGFR has been calculated using the CKD EPI equation.     This calculation has not been validated in all clinical situations.     eGFR's persistently <90 mL/min signify possible Chronic Kidney     Disease.   LIPASE, BLOOD     Status: None   Collection Time    08/06/13  4:40 PM      Result Value Ref Range   Lipase 36  11 - 59 U/L  URINALYSIS, ROUTINE W REFLEX MICROSCOPIC     Status:  Abnormal   Collection Time    08/06/13  6:07 PM      Result Value Ref Range   Color, Urine YELLOW  YELLOW   APPearance HAZY (*) CLEAR   Specific Gravity, Urine 1.019  1.005 - 1.030   pH 5.0  5.0 - 8.0   Glucose, UA NEGATIVE  NEGATIVE mg/dL   Hgb urine dipstick NEGATIVE  NEGATIVE   Bilirubin Urine NEGATIVE  NEGATIVE   Ketones, ur NEGATIVE  NEGATIVE mg/dL   Protein, ur 30 (*) NEGATIVE mg/dL   Urobilinogen, UA 0.2  0.0 - 1.0 mg/dL   Nitrite NEGATIVE  NEGATIVE   Leukocytes, UA NEGATIVE  NEGATIVE  URINE MICROSCOPIC-ADD ON     Status: Abnormal   Collection Time    08/06/13  6:07 PM      Result Value Ref Range   WBC, UA 0-2  <3 WBC/hpf   Bacteria, UA RARE  RARE   Casts HYALINE CASTS (*) NEGATIVE   Comment: GRANULAR CAST   Urine-Other AMORPHOUS URATES/PHOSPHATES    CREATININE, URINE, RANDOM     Status: None   Collection Time    08/06/13  6:07 PM      Result Value Ref Range   Creatinine, Urine 192.3     Comment: Performed at [facility]  LACTIC ACID, PLASMA     Status: None   Collection Time    08/06/13 11:17 PM      Result Value Ref Range   Lactic Acid, Venous 1.8  0.5 - 2.2 mmol/L  SODIUM, URINE, RANDOM     Status: None   Collection Time    08/07/13 12:18 AM      Result Value Ref Range   Sodium, Ur 20     Comment: Performed at [place], [facility]     Status: Abnormal   Collection Time    08/07/13  1:51 AM      Result Value Ref Range   Glucose-Capillary 130 (*) 70 - 99 mg/dL  GLUCOSE, CAPILLARY     Status: Abnormal   Collection Time    08/07/13  4:49 AM      Result Value Ref Range   Glucose-Capillary 119 (*) 70 - 99 mg/dL  BASIC METABOLIC PANEL     Status: Abnormal   Collection Time    08/07/13  5:41 AM      Result Value Ref Range   Sodium 135 (*) 137 - 147  mEq/L   Potassium 4.4  3.7 - 5.3 mEq/L   Chloride 101  96 - 112 mEq/L   CO2 16 (*) 19 - 32 mEq/L   Glucose, Bld 110 (*) 70 - 99 mg/dL   BUN 50 (*) 6 - 23 mg/dL   Creatinine, Ser 1.61 (*) 0.50 - 1.35 mg/dL   Comment: DELTA CHECK NOTED     REPEATED TO VERIFY   Calcium 8.6  8.4 - 10.5 mg/dL   GFR calc non Af Amer 39 (*) >90 mL/min   GFR calc Af Amer 45 (*) >90 mL/min   Comment: (NOTE)     The eGFR has been calculated using the CKD EPI equation.     This calculation has not been validated in all clinical situations.     eGFR's persistently <90 mL/min signify possible Chronic Kidney     Disease.  CBC     Status: Abnormal   Collection Time    08/07/13  5:41 AM      Result Value Ref Range  WBC 4.0  4.0 - 10.5 K/uL   RBC 4.27  4.22 - 5.81 MIL/uL   Hemoglobin 12.4 (*) 13.0 - 17.0 g/dL   HCT 35.6 (*) 39.0 - 52.0 %   MCV 83.4  78.0 - 100.0 fL   MCH 29.0  26.0 - 34.0 pg   MCHC 34.8  30.0 - 36.0 g/dL   RDW 13.8  11.5 - 15.5 %   Platelets 181  150 - 400 K/uL  GLUCOSE, CAPILLARY     Status: Abnormal   Collection Time    08/07/13  8:14 AM      Result Value Ref Range   Glucose-Capillary 104 (*) 70 - 99 mg/dL  CLOSTRIDIUM DIFFICILE BY PCR     Status: None   Collection Time    08/07/13 11:27 AM      Result Value Ref Range   C difficile by pcr NEGATIVE  NEGATIVE   Comment: Performed at [place], [place]     Status: Abnormal   Collection Time    08/07/13 12:12 PM      Result Value Ref Range   Glucose-Capillary 104 (*) 70 - 99 mg/dL  GLUCOSE, CAPILLARY     Status: None   Collection Time    08/07/13  5:14 PM      Result Value Ref Range   Glucose-Capillary 83  70 - 99 mg/dL  GLUCOSE, CAPILLARY     Status: Abnormal   Collection Time    08/07/13  8:56 PM      Result Value Ref Range   Glucose-Capillary 109 (*) 70 - 99 mg/dL  BASIC METABOLIC PANEL     Status: Abnormal   Collection Time    08/08/13  7:42 AM      Result Value Ref Range   Sodium 137  137 - 147  mEq/L   Potassium 4.0  3.7 - 5.3 mEq/L   Chloride 103  96 - 112 mEq/L   CO2 21  19 - 32 mEq/L   Glucose, Bld 100 (*) 70 - 99 mg/dL   BUN 29 (*) 6 - 23 mg/dL   Comment: RESULT REPEATED AND VERIFIED     DELTA CHECK NOTED   Creatinine, Ser 1.37 (*) 0.50 - 1.35 mg/dL   Calcium 8.7  8.4 - 10.5 mg/dL   GFR calc non Af Amer 47 (*) >90 mL/min   GFR calc Af Amer 55 (*) >90 mL/min   Comment: (NOTE)     The eGFR has been calculated using the CKD EPI equation.     This calculation has not been validated in all clinical situations.     eGFR's persistently <90 mL/min signify possible Chronic Kidney     Disease.  CBC     Status: Abnormal   Collection Time    08/08/13  7:42 AM      Result Value Ref Range   WBC 4.4  4.0 - 10.5 K/uL   RBC 4.06 (*) 4.22 - 5.81 MIL/uL   Hemoglobin 11.6 (*) 13.0 - 17.0 g/dL   HCT 34.4 (*) 39.0 - 52.0 %   MCV 84.7  78.0 - 100.0 fL   MCH 28.6  26.0 - 34.0 pg   MCHC 33.7  30.0 - 36.0 g/dL   RDW 13.7  11.5 - 15.5 %   Platelets 187  150 - 400 K/uL  GLUCOSE, CAPILLARY     Status: None   Collection Time    08/08/13  8:24 AM  Result Value Ref Range   Glucose-Capillary 95  70 - 99 mg/dL    Imaging / Studies: Ct Abdomen Pelvis Wo Contrast  08/06/2013   CLINICAL DATA:  Abdominal pain with vomiting  EXAM: CT ABDOMEN AND PELVIS WITHOUT CONTRAST  TECHNIQUE: Multidetector CT imaging of the abdomen and pelvis was performed following the standard protocol without intravenous contrast.  COMPARISON:  Abdominal ultrasound performed earlier today, CT abdomen/pelvis 04/11/2010  FINDINGS: Lower Chest: The lung bases are clear. Visualized cardiac structures within normal limits for size. Extensive atherosclerotic calcifications throughout the visualized coronary arteries. No pericardial effusion. Unremarkable visualized distal thoracic esophagus.  Abdomen: Unenhanced CT was performed per clinician order. Lack of IV contrast limits sensitivity and specificity, especially for evaluation  of abdominal/pelvic solid viscera. Within these limitations, unremarkable CT appearance of the stomach, duodenum, spleen, adrenal glands and pancreas. Normal hepatic contour and morphology. Multiple small 2-3 mm hypo attenuating foci throughout liver are nonspecific and too small to accurately characterize. Layering high attenuation within the gallbladder lumen favored to reflect gallbladder sludge. Small stones felt unlikely given the negative recent abdominal ultrasound.  Unremarkable appearance of the bilateral kidneys. No focal solid lesion, hydronephrosis or nephrolithiasis.  Highly redundant sigmoid colon. Scattered colonic diverticula without evidence of active inflammation. Surgical changes of prior right hemicolectomy. Entero colonic anastomosis appears widely patent. Multiple loops of dilated proximal and mid small bowel with air-fluid levels are noted in the mid abdomen. Some of the more distal loops of bowel have a flocculent appearance to their internal contents. There is no definite transition point, more a transition to normal caliber distal ileum with several acute angulations in the right hemi abdomen. This is likely obstruction related to underlying surgical adhesive disease. There is some trace inflammatory stranding around loops of bowel in the right lower quadrant with very small amounts of interloop fluid. No free air.  Pelvis: Prostatomegaly. The prostate gland measures 5 x 5.5 cm and demonstrates coarse internal calcifications. Indents the bladder base. The bladder is otherwise unremarkable. No free fluid or suspicious adenopathy.  Bones/Soft Tissues: No acute fracture or aggressive appearing lytic or blastic osseous lesion.  Vascular: Limited evaluation in the absence of intravenous contrast. Extensive atherosclerotic vascular calcifications without aneurysmal dilatation.  IMPRESSION: 1. Small bowel obstruction likely secondary to surgical adhesive disease. No definite transition point  identified. 2. Surgical changes of prior right hemicolectomy. The entero colonic anastomosis is widely patent. 3. Multiple tiny 2- 3 mm hypo attenuating foci throughout the liver are nonspecific and too small to accurately characterize. These could not be definitively identified on the prior CT scan of the abdomen and pelvis from October of 2011. 4. High attenuation material layering within the gallbladder lumen favored to reflect gallbladder sludge. 5. Colonic diverticular disease without CT evidence of active inflammation. 6. Prostatomegaly 7. Extensive atherosclerotic vascular calcifications without aneurysmal dilatation.   Electronically Signed   By: Jacqulynn Cadet M.D.   On: 08/06/2013 21:08   US Abdomen Complete  08/06/2013   CLINICAL DATA:  Right upper quadrant and right flank pain.  EXAM: ULTRASOUND ABDOMEN COMPLETE  COMPARISON:  CT abdomen/pelvis 04/11/2010  FINDINGS: Gallbladder:  No gallstones or wall thickening visualized. No sonographic Murphy sign noted.  Common bile duct:  Diameter: 4 mm.  Liver:  No focal lesion identified. Within normal limits in parenchymal echogenicity.  IVC:  No abnormality visualized.  Pancreas:  Visualized portion unremarkable.  Spleen:  Size and appearance within normal limits.  Right Kidney:  Length: 10.9 cm. Echogenicity  within normal limits. No mass or hydronephrosis visualized.  Left Kidney:  Length: 11.0 cm. Echogenicity within normal limits. No mass or hydronephrosis visualized.  Abdominal aorta:  Mid to distal segment not well visualized due to abundant overlying bowel gas. Proximal segment within normal.  Other findings:  None.  IMPRESSION: No acute findings.   Electronically Signed   By: Marin Olp M.D.   On: 08/06/2013 18:05   Dg Abd Acute W/chest  08/07/2013   CLINICAL DATA:  Upper abdominal pain, diarrhea, sore throat  EXAM: ACUTE ABDOMEN SERIES (ABDOMEN 2 VIEW & CHEST 1 VIEW)  COMPARISON:  CT abdomen/ pelvis 08/06/2013  FINDINGS: Cardiac and  mediastinal contours within normal limits. Patient is status post median sternotomy with evidence of prior CABG. Mild bibasilar atelectasis. Trace chronic bronchitic changes. Otherwise, the lungs are clear. Surgical clips noted in the soft tissues of the right neck. No acute osseous abnormality.  Numerous loops of air-filled and dilated small bowel throughout the mid abdomen remain concerning for small bowel obstruction. Maximum small bowel diameter 5 cm by conventional x-ray which may be an overestimate. There are multiple small air-fluid levels on the upright radiograph. No free air.  IMPRESSION: Persistent gaseous distension of small bowel loops with multiple air-fluid levels remains concerning for small bowel obstruction. Enteritis with ileus could appear similar.   Electronically Signed   By: Jacqulynn Cadet M.D.   On: 08/07/2013 02:02    Medications / Allergies: per chart  Antibiotics: Anti-infectives   None       Note: This dictation was prepared with Dragon/digital dictation along with [employer]. Any transcriptional errors that result from this process are unintentional.

## 2013-08-08 NOTE — Progress Notes (Signed)
[facility] PROGRESS NOTE  Clayton Harrison ZOX:[id number] DOB: 1931/06/07 DOA: 08/06/2013 PCP: [name], MD  Assessment/Plan  Partial SBO due to postoperative adhesions with or without gastroenteritis, feeling better -  Advance to soft diet this evening - Continue IVF -  Continue pain medication and anti-emetics -  C Diff neg -  stool cx pending -  GI path panel pending  nongap metabolic acidosis secondary to diarrhea (bicarb losses) -  Stable. -  Continue hydration  AKI due to dehydration, baseline creatinine of 1 -  Improved with hydration  DM -  Hold oral meds -  Continue SSI -  A1c 6.2 -  Recommend that sulfonylurea be discontinued due to risk of hypoglycemia  HTN, blood pressure higher today -  Restart BB and norvasc -  hold ACEI for at least 1-2 weeks before restarting  Atherosclerosis of aorta noted on CT  BPH noted on CT.  asymptomatic  Diet:  CLD Access:  PIV IVF:  yes Proph:  heparin  Code Status: full Family Communication: patient and wife Disposition Plan: pending improvement in diarrhea and kidney function, likely to home tomorrow   Consultants:  Gen Surg  Procedures:  Abd [place]  CT ab/p  KUB  Antibiotics:  none   HPI/Subjective:  Abdominal pain resolved, stools less frequent, tolerating diet  Objective: Filed Vitals:   08/07/13 2100 08/08/13 0550 08/08/13 0810 08/08/13 1418  BP: 132/63 144/70 145/77 160/85  Pulse: 83 75 84   Temp: 99.2 F (37.3 C) 98.5 F (36.9 C) 97.7 F (36.5 C) 97.7 F (36.5 C)  TempSrc: Oral Oral Oral Oral  Resp: 18 18 18 18   Height:      Weight:      SpO2: 96% 96% 97% 96%    Intake/Output Summary (Last 24 hours) at 08/08/13 1904 Last data filed at 08/08/13 1849  Gross per 24 hour  Intake    600 ml  Output      0 ml  Net    600 ml   Filed Weights   08/07/13 0052  Weight: 86.183 kg (190 lb)    Exam:   General:  WM, No acute distress  HEENT:  NCAT, MMM  Cardiovascular:   RRR, nl S1, S2 no mrg, 2+ pulses, warm extremities  Respiratory:  CTAB, no increased WOB  Abdomen:   Hyperactive BS, soft, nondistended, nontender  MSK:   Normal tone and bulk, no LEE  Neuro:  Grossly intact  Data Reviewed: Basic Metabolic Panel:  Recent Labs Lab 08/06/13 1640 08/07/13 0541 08/08/13 0742  NA 132* 135* 137  K 5.1 4.4 4.0  CL 94* 101 103  CO2 16* 16* 21  GLUCOSE 175* 110* 100*  BUN 68* 50* 29*  CREATININE 2.70* 1.61* 1.37*  CALCIUM 9.7 8.6 8.7   Liver Function Tests:  Recent Labs Lab 08/06/13 1640  AST 22  ALT 20  ALKPHOS 69  BILITOT 0.4  PROT 8.5*  ALBUMIN 4.5    Recent Labs Lab 08/06/13 1640  LIPASE 36   No results found for this basename: AMMONIA,  in the last 168 hours CBC:  Recent Labs Lab 08/06/13 1640 08/07/13 0541 08/08/13 0742  WBC 9.2 4.0 4.4  NEUTROABS 7.5  --   --   HGB 14.5 12.4* 11.6*  HCT 42.2 35.6* 34.4*  MCV 84.4 83.4 84.7  PLT 233 181 187   Cardiac Enzymes: No results found for this basename: CKTOTAL, CKMB, CKMBINDEX, TROPONINI,  in the last 168 hours BNP (last 3  results) No results found for this basename: PROBNP,  in the last 8760 hours CBG:  Recent Labs Lab 08/07/13 1714 08/07/13 2056 08/08/13 0824 08/08/13 1227 08/08/13 1646  GLUCAP 83 109* 95 148* 143*    Recent Results (from the past 240 hour(s))  CLOSTRIDIUM DIFFICILE BY PCR     Status: None   Collection Time    08/07/13 11:27 AM      Result Value Ref Range Status   C difficile by pcr NEGATIVE  NEGATIVE Final   Comment: Performed at [facility]     Status: None   Collection Time    08/07/13 11:27 AM      Result Value Ref Range Status   Specimen Description STOOL   Final   Special Requests Normal   Final   Culture     Final   Value: Culture reincubated for better growth     Performed at [facility]   Report Status PENDING   Incomplete     Studies: Ct Abdomen Pelvis Wo Contrast  08/06/2013   CLINICAL  DATA:  Abdominal pain with vomiting  EXAM: CT ABDOMEN AND PELVIS WITHOUT CONTRAST  TECHNIQUE: Multidetector CT imaging of the abdomen and pelvis was performed following the standard protocol without intravenous contrast.  COMPARISON:  Abdominal ultrasound performed earlier today, CT abdomen/pelvis 04/11/2010  FINDINGS: Lower Chest: The lung bases are clear. Visualized cardiac structures within normal limits for size. Extensive atherosclerotic calcifications throughout the visualized coronary arteries. No pericardial effusion. Unremarkable visualized distal thoracic esophagus.  Abdomen: Unenhanced CT was performed per clinician order. Lack of IV contrast limits sensitivity and specificity, especially for evaluation of abdominal/pelvic solid viscera. Within these limitations, unremarkable CT appearance of the stomach, duodenum, spleen, adrenal glands and pancreas. Normal hepatic contour and morphology. Multiple small 2-3 mm hypo attenuating foci throughout liver are nonspecific and too small to accurately characterize. Layering high attenuation within the gallbladder lumen favored to reflect gallbladder sludge. Small stones felt unlikely given the negative recent abdominal ultrasound.  Unremarkable appearance of the bilateral kidneys. No focal solid lesion, hydronephrosis or nephrolithiasis.  Highly redundant sigmoid colon. Scattered colonic diverticula without evidence of active inflammation. Surgical changes of prior right hemicolectomy. Entero colonic anastomosis appears widely patent. Multiple loops of dilated proximal and mid small bowel with air-fluid levels are noted in the mid abdomen. Some of the more distal loops of bowel have a flocculent appearance to their internal contents. There is no definite transition point, more a transition to normal caliber distal ileum with several acute angulations in the right hemi abdomen. This is likely obstruction related to underlying surgical adhesive disease. There is  some trace inflammatory stranding around loops of bowel in the right lower quadrant with very small amounts of interloop fluid. No free air.  Pelvis: Prostatomegaly. The prostate gland measures 5 x 5.5 cm and demonstrates coarse internal calcifications. Indents the bladder base. The bladder is otherwise unremarkable. No free fluid or suspicious adenopathy.  Bones/Soft Tissues: No acute fracture or aggressive appearing lytic or blastic osseous lesion.  Vascular: Limited evaluation in the absence of intravenous contrast. Extensive atherosclerotic vascular calcifications without aneurysmal dilatation.  IMPRESSION: 1. Small bowel obstruction likely secondary to surgical adhesive disease. No definite transition point identified. 2. Surgical changes of prior right hemicolectomy. The entero colonic anastomosis is widely patent. 3. Multiple tiny 2- 3 mm hypo attenuating foci throughout the liver are nonspecific and too small to accurately characterize. These could not be definitively identified  on the prior CT scan of the abdomen and pelvis from October of 2011. 4. High attenuation material layering within the gallbladder lumen favored to reflect gallbladder sludge. 5. Colonic diverticular disease without CT evidence of active inflammation. 6. Prostatomegaly 7. Extensive atherosclerotic vascular calcifications without aneurysmal dilatation.   Electronically Signed   By: Jacqulynn Cadet M.D.   On: 08/06/2013 21:08   Dg Abd Acute W/chest  08/07/2013   CLINICAL DATA:  Upper abdominal pain, diarrhea, sore throat  EXAM: ACUTE ABDOMEN SERIES (ABDOMEN 2 VIEW & CHEST 1 VIEW)  COMPARISON:  CT abdomen/ pelvis 08/06/2013  FINDINGS: Cardiac and mediastinal contours within normal limits. Patient is status post median sternotomy with evidence of prior CABG. Mild bibasilar atelectasis. Trace chronic bronchitic changes. Otherwise, the lungs are clear. Surgical clips noted in the soft tissues of the right neck. No acute osseous  abnormality.  Numerous loops of air-filled and dilated small bowel throughout the mid abdomen remain concerning for small bowel obstruction. Maximum small bowel diameter 5 cm by conventional x-ray which may be an overestimate. There are multiple small air-fluid levels on the upright radiograph. No free air.  IMPRESSION: Persistent gaseous distension of small bowel loops with multiple air-fluid levels remains concerning for small bowel obstruction. Enteritis with ileus could appear similar.   Electronically Signed   By: Jacqulynn Cadet M.D.   On: 08/07/2013 02:02    Scheduled Meds: . amLODipine  10 mg Oral Daily  . heparin  5,000 Units Subcutaneous 3 times per day  . insulin aspart  0-5 Units Subcutaneous QHS  . insulin aspart  0-9 Units Subcutaneous TID WC  . lip balm  1 application Topical BID  . metoprolol tartrate  50 mg Oral BID  . pantoprazole  40 mg Oral Daily  . saccharomyces boulardii  250 mg Oral BID   Continuous Infusions: . sodium chloride 50 mL/hr at 08/08/13 1251    Principal Problem:   Acute diarrhea Active Problems:   DM (diabetes mellitus) with peripheral vascular complication   HYPERLIPIDEMIA   HYPERTENSION   CORONARY ARTERY DISEASE   CEREBROVASCULAR DISEASE   PERIPHERAL VASCULAR DISEASE   GERD   BPH (benign prostatic hyperplasia)   H/O right hemicolectomy   Acute kidney injury   Metabolic acidosis   HOH (hard of hearing)   Partial small bowel obstruction    Time spent: 30 min    Theophilus Walz, [facility] Pager [phone]. If 7PM-7AM, please contact night-coverage at [email or web address], password [facility] 08/08/2013, 7:04 PM  LOS: 2 days

## 2013-08-09 DIAGNOSIS — D649 Anemia, unspecified: Secondary | ICD-10-CM

## 2013-08-09 DIAGNOSIS — I10 Essential (primary) hypertension: Secondary | ICD-10-CM

## 2013-08-09 LAB — BASIC METABOLIC PANEL
BUN: 15 mg/dL (ref 6–23)
CO2: 22 mEq/L (ref 19–32)
Calcium: 8.6 mg/dL (ref 8.4–10.5)
Chloride: 104 mEq/L (ref 96–112)
Creatinine, Ser: 1.11 mg/dL (ref 0.50–1.35)
GFR calc Af Amer: 70 mL/min — ABNORMAL LOW (ref >90)
GFR calc non Af Amer: 61 mL/min — ABNORMAL LOW (ref >90)
Glucose, Bld: 136 mg/dL — ABNORMAL HIGH (ref 70–99)
Potassium: 3.8 mEq/L (ref 3.7–5.3)
Sodium: 139 mEq/L (ref 137–147)

## 2013-08-09 LAB — GI PATHOGEN PANEL BY PCR, STOOL
C difficile toxin A/B: NEGATIVE
Campylobacter by PCR: NEGATIVE
Cryptosporidium by PCR: NEGATIVE
E coli (ETEC) LT/ST: NEGATIVE
E coli (STEC): NEGATIVE
E coli 0157 by PCR: NEGATIVE
G lamblia by PCR: NEGATIVE
Norovirus GI/GII: NEGATIVE
Rotavirus A by PCR: POSITIVE
Salmonella by PCR: NEGATIVE
Shigella by PCR: NEGATIVE

## 2013-08-09 LAB — CBC
HCT: 33.3 % — ABNORMAL LOW (ref 39.0–52.0)
Hemoglobin: 11.6 g/dL — ABNORMAL LOW (ref 13.0–17.0)
MCH: 29.1 pg (ref 26.0–34.0)
MCHC: 34.8 g/dL (ref 30.0–36.0)
MCV: 83.7 fL (ref 78.0–100.0)
Platelets: 188 10*3/uL (ref 150–400)
RBC: 3.98 MIL/uL — ABNORMAL LOW (ref 4.22–5.81)
RDW: 13.6 % (ref 11.5–15.5)
WBC: 4.8 10*3/uL (ref 4.0–10.5)

## 2013-08-09 LAB — GLUCOSE, CAPILLARY: Glucose-Capillary: 133 mg/dL — ABNORMAL HIGH (ref 70–99)

## 2013-08-09 MED ORDER — POLYETHYLENE GLYCOL 3350 17 G PO PACK: 17.0000 g | PACK | Freq: Every day | ORAL | Status: DC

## 2013-08-09 NOTE — Progress Notes (Signed)
Utilization review completed.  

## 2013-08-09 NOTE — Progress Notes (Signed)
  Subjective: Tolerating a regular diet last PM, no Diarrhea now.  He says he's ambulating well.  He says he only had 2 BM yesterday and they were mostly gas.  Objective: Vital signs in last 24 hours: Temp:  [97.7 F (36.5 C)-98.4 F (36.9 C)] 97.8 F (36.6 C) (02/16 0535) Pulse Rate:  [69-84] 69 (02/16 0535) Resp:  [16-20] 16 (02/16 0535) BP: (136-167)/(73-85) 167/79 mmHg (02/16 0535) SpO2:  [95 %-97 %] 95 % (02/16 0535) Last BM Date: 08/08/13 480 PO recorded 3 stools recorded yesterday Afebrile, BP up some, Creatinine is back to normal, Cbc is normal C diff is normal/stool cultures still pending Diet:  soft Intake/Output from previous day: 02/15 0701 - 02/16 0700 In: 1329.2 [P.O.:480; I.V.:849.2] Out: -  Intake/Output this shift:    General appearance: alert, cooperative, no distress and up in chair, no discomfort. GI: soft, non-tender; bowel sounds normal; no masses,  no organomegaly and midline surgical scar.  Lab Results:   Recent Labs  08/08/13 0742 08/09/13 0400  WBC 4.4 4.8  HGB 11.6* 11.6*  HCT 34.4* 33.3*  PLT 187 188    BMET  Recent Labs  08/08/13 0742 08/09/13 0400  NA 137 139  K 4.0 3.8  CL 103 104  CO2 21 22  GLUCOSE 100* 136*  BUN 29* 15  CREATININE 1.37* 1.11  CALCIUM 8.7 8.6   PT/INR No results found for this basename: LABPROT, INR,  in the last 72 hours   Recent Labs Lab 08/06/13 1640  AST 22  ALT 20  ALKPHOS 69  BILITOT 0.4  PROT 8.5*  ALBUMIN 4.5     Lipase     Component Value Date/Time   LIPASE 36 08/06/2013 1640     Studies/Results: No results found.  Medications: . amLODipine  10 mg Oral Daily  . heparin  5,000 Units Subcutaneous 3 times per day  . insulin aspart  0-5 Units Subcutaneous QHS  . insulin aspart  0-9 Units Subcutaneous TID WC  . lip balm  1 application Topical BID  . metoprolol tartrate  50 mg Oral BID  . pantoprazole  40 mg Oral Daily  . saccharomyces boulardii  250 mg Oral BID     Assessment/Plan Diarrhea with abdominal pain 48 hours before admission SBO vs ENTERITIS Hx of hemicolectomy 2011 AKI AODM HYPERTENSION S/p CABG/CE/PVD bph Possible gall bladder sludge on CT scan Multiple tiny 2- 3 mm hypo attenuating foci throughout the liver  are nonspecific and too small to accurately characterize.   Plan:  He does not appear to have an obstruction, having allot of flatus.  No acute surgical issue currently.    LOS: 3 days    Harrison,Clayton 08/09/2013  Agree with above. Wife in room.  Doing well.  Sees Dr. Charise Carwin as PCP. Knows about findings on CT scan. Plans to go home today.  Alphonsa Overall, MD, [facility] Pager: [phone] Office phone:  [phone]

## 2013-08-09 NOTE — Discharge Summary (Signed)
Physician Discharge Summary  Clayton Harrison [id number] DOB: Sep 08, 1930 DOA: 08/06/2013  PCP: [name], MD  Admit date: 08/06/2013 Discharge date: 08/09/2013  Recommendations for Outpatient Follow-up:  1. Follow up with Dr. [name] within 1-2 weeks for follow up, general surgery prn.  Repeat BP and BMP + CBC in 1 week to determine if safe to restart ACEI and f/u anemia.  C. Diff PCR was negative, but stool culture and GI pathogen panel are pending at the time of discharge. Stopped SU due to risk of hypoglycemia given recent bowel obstruction, irregular oral intake, and low A1c.   Discharge Diagnoses:  Principal Problem:   Partial small bowel obstruction Active Problems:   DM (diabetes mellitus) with peripheral vascular complication   ANEMIA-UNSPECIFIED   HYPERTENSION   CEREBROVASCULAR DISEASE   GERD   BPH (benign prostatic hyperplasia)   Acute kidney injury   Metabolic acidosis   Acute diarrhea   Discharge Condition: stable, improved   Diet recommendation:  Diabetic diet  Wt Readings from Last 3 Encounters:  08/07/13 86.183 kg (190 lb)  07/22/13 87.998 kg (194 lb)  04/15/13 87.544 kg (193 lb)    History of present illness:  80 year old male with history of CAD status post CABG, status post carotid endarterectomy, peripheral vascular disease, type 2 diabetes mellitus, history of vertigo, and a right hemicolectomy in 2011 by Dr. Donne Harrison with finding of low-grade dysphagia presented to the ED we did several episodes of watery diarrhea for past 2 days. Patient reports eating some corn beef one day prior to onset of symptoms. He he had about 10-15 episodes of watery diarrhea on first day and again several episodes yesterday. he denies any mucus or blood in the stool. He reports having crampy abdominal pain mainly in the epigastric and supraumbilical area radiating from side to side. He denies any nausea or vomiting. He does report having some dizziness. Patient denies  headache, , fever, chills, nausea , vomiting, chest pain, palpitations, SOB,or urinary symptoms. Denies change in weight or appetite. Last episode of diarrhea was this afternoon.   Hospital Course:   Partial SBO due to postoperative adhesions with or without gastroenteritis.  His abdominal pain and diarrhea were initially severe.  He was seen by general surgery and made NPO, however, he did not require NG tube.  His abdominal pain quickly improved as did his diarrhea and he was able to tolerate a regular diet before going home.  Additional stool studies were sent because of a recent local outbreak of infectious gastroenteritis.  C. Diff PCR was negative, but stool culture and GI pathogen panel are pending at the time of discharge.  PCP to please follow up on these test results.     [place] metabolic acidosis secondary to diarrhea, improved with hydration and resolution of diarrhea. Initial bicarb 16, but 22 on date of discharge.    AKI due to dehydration, baseline creatinine of 1.  Creatinine trended down from 2.7 to 1.1 with IVF.    T2DM.  Oral medications were held and he received SSI.  His last hemoglobin a1c was 6.2.  He was advised to resume metformin, but because of his low A1c, he was advised to stop his sulfonylurea due to the risk of hypoglycemia, especially while he is recovering from this illness.  Patient and family voiced understanding.  Defer further management to PCP.    HTN, blood pressures initially low normal and oral BP medications were held, however, his blood pressure was high while taking his BB  and norvasc at the time of discharge.  Defer BP management to PCP.  ACEI held at discharge due to recent AKI and may be restarted in 1-2 weeks by PCP.    Atherosclerosis of aorta noted on CT.  BPH noted on CT. Asymptomatic.  Consultants:  Gen Surg Procedures:  Abd [place]  CT ab/p  KUB Antibiotics:  none    Discharge Exam: Filed Vitals:   08/09/13 0535  BP: 167/79  Pulse: 69   Temp: 97.8 F (36.6 C)  Resp: 16   Filed Vitals:   08/08/13 0810 08/08/13 1418 08/08/13 2215 08/09/13 0535  BP: 145/77 160/85 136/73 167/79  Pulse: 84  76 69  Temp: 97.7 F (36.5 C) 97.7 F (36.5 C) 98.4 F (36.9 C) 97.8 F (36.6 C)  TempSrc: Oral Oral Oral Oral  Resp: 18 18 20 16   Height:      Weight:      SpO2: 97% 96% 96% 95%    General: WM, No acute distress  HEENT: NCAT, MMM  Cardiovascular: RRR, nl S1, S2 no mrg, 2+ pulses, warm extremities  Respiratory: CTAB, no increased WOB  Abdomen: NABS, soft, nondistended, nontender  MSK: Normal tone and bulk, no LEE  Neuro: Grossly intact   Discharge Instructions      Discharge Orders   Future Appointments Provider Department Dept Phone   10/19/2013 2:30 PM Hali Marry, MD [place] [phone]   08/05/2014 12:30 PM Hayden Pedro, MD [place] [phone]   Future Orders Complete By Expires   Call MD for:  difficulty breathing, headache or visual disturbances  As directed    Call MD for:  extreme fatigue  As directed    Call MD for:  hives  As directed    Call MD for:  persistant dizziness or light-headedness  As directed    Call MD for:  persistant nausea and vomiting  As directed    Call MD for:  severe uncontrolled pain  As directed    Call MD for:  temperature >100.4  As directed    Diet Carb Modified  As directed    Discharge instructions  As directed    Comments:     You were hospitalized with a partial small bowel obstruction which was causing abdominal pain and diarrhea.  Fortunately, your bowel obstruction improved on its own without the need for surgery.  Please take metamucil and miralax every day to prevent constipation.  You may adjust your doses so that you have 2-3 soft bowel movements every day.  Because you were dehydrated, your kidneys stopped working as well.  They are back to normal now, however, please do not resume your  lisinopril until your primary care doctor tells you it is okay.  Finally, please stop taking your glipizide because this medication can cause hypoglycemia or low blood sugars, especially when your hemoglobin A1c is as low as it is (6.2) and when you may not be eating normally because of your partial bowel obstruction.  Please follow up with your primary care doctor in 1-2 weeks to have your bloodwork repeated and your blood pressure checked.  If you have recurrent symptoms of abdominal pain, bloating, diarrhea, nausea, vomiting, please return to the hospital as these may be signs that your bowel obstruction has returned.   Increase activity slowly  As directed        Medication List    STOP taking these medications  glipiZIDE 5 MG 24 hr tablet  Commonly known as:  GLUCOTROL XL     lisinopril 20 MG tablet  Commonly known as:  PRINIVIL,ZESTRIL     loperamide 2 MG tablet  Commonly known as:  IMODIUM A-D      TAKE these medications       acetaminophen 500 MG tablet  Commonly known as:  TYLENOL  Take 1,000 mg by mouth every 6 (six) hours as needed for mild pain.     amLODipine 10 MG tablet  Commonly known as:  NORVASC  Take 10 mg by mouth daily.     aspirin 325 MG tablet  Take 325 mg by mouth daily.     meclizine 25 MG tablet  Commonly known as:  ANTIVERT  Take 12.5 mg by mouth as needed for dizziness.     metFORMIN 1000 MG tablet  Commonly known as:  GLUCOPHAGE  Take 1,000 mg by mouth 2 (two) times daily with a meal.     metoprolol 50 MG tablet  Commonly known as:  LOPRESSOR  Take 50 mg by mouth 2 (two) times daily.     polyethylene glycol packet  Commonly known as:  MIRALAX / GLYCOLAX  Take 17 g by mouth daily.     PRESERVISION AREDS 2 PO  Take 1 tablet by mouth 2 (two) times daily.     PREVACID 15 MG capsule  Generic drug:  lansoprazole  Take 15 mg by mouth daily as needed (acid reflux).     SYSTANE 0.4-0.3 % Soln  Generic drug:  Polyethyl Glycol-Propyl  Glycol  Apply 2 drops to eye 2 (two) times daily as needed (dry eye).       Follow-up Information   Follow up with METHENEY,CATHERINE, MD. Schedule an appointment as soon as possible for a visit in 2 weeks.   Specialty:  Family Medicine   Contact information:   [id number]  [place] [address] [phone]       Schedule an appointment as soon as possible for a visit with Adin Hector., MD. (As needed)    Specialty:  General Surgery   Contact information:   [place] [id number] [phone]        The results of significant diagnostics from this hospitalization (including imaging, microbiology, ancillary and laboratory) are listed below for reference.    Significant Diagnostic Studies: Ct Abdomen Pelvis Wo Contrast  08/06/2013   CLINICAL DATA:  Abdominal pain with vomiting  EXAM: CT ABDOMEN AND PELVIS WITHOUT CONTRAST  TECHNIQUE: Multidetector CT imaging of the abdomen and pelvis was performed following the standard protocol without intravenous contrast.  COMPARISON:  Abdominal ultrasound performed earlier today, CT abdomen/pelvis 04/11/2010  FINDINGS: Lower Chest: The lung bases are clear. Visualized cardiac structures within normal limits for size. Extensive atherosclerotic calcifications throughout the visualized coronary arteries. No pericardial effusion. Unremarkable visualized distal thoracic esophagus.  Abdomen: Unenhanced CT was performed per clinician order. Lack of IV contrast limits sensitivity and specificity, especially for evaluation of abdominal/pelvic solid viscera. Within these limitations, unremarkable CT appearance of the stomach, duodenum, spleen, adrenal glands and pancreas. Normal hepatic contour and morphology. Multiple small 2-3 mm hypo attenuating foci throughout liver are nonspecific and too small to accurately characterize. Layering high attenuation within the gallbladder lumen favored to reflect gallbladder  sludge. Small stones felt unlikely given the negative recent abdominal ultrasound.  Unremarkable appearance of the bilateral kidneys. No focal solid lesion, hydronephrosis or nephrolithiasis.  Highly redundant sigmoid colon. Scattered colonic diverticula without evidence of active inflammation. Surgical changes of prior right hemicolectomy. Entero colonic anastomosis appears widely patent. Multiple loops of dilated proximal and mid small bowel with air-fluid levels are noted in the mid abdomen. Some of the more distal loops of bowel have a flocculent appearance to their internal contents. There is no definite transition point, more a transition to normal caliber distal ileum with several acute angulations in the right hemi abdomen. This is likely obstruction related to underlying surgical adhesive disease. There is some trace inflammatory stranding around loops of bowel in the right lower quadrant with very small amounts of interloop fluid. No free air.  Pelvis: Prostatomegaly. The prostate gland measures 5 x 5.5 cm and demonstrates coarse internal calcifications. Indents the bladder base. The bladder is otherwise unremarkable. No free fluid or suspicious adenopathy.  Bones/Soft Tissues: No acute fracture or aggressive appearing lytic or blastic osseous lesion.  Vascular: Limited evaluation in the absence of intravenous contrast. Extensive atherosclerotic vascular calcifications without aneurysmal dilatation.  IMPRESSION: 1. Small bowel obstruction likely secondary to surgical adhesive disease. No definite transition point identified. 2. Surgical changes of prior right hemicolectomy. The entero colonic anastomosis is widely patent. 3. Multiple tiny 2- 3 mm hypo attenuating foci throughout the liver are nonspecific and too small to accurately characterize. These could not be definitively identified on the prior CT scan of the abdomen and pelvis from October of 2011. 4. High attenuation material layering within the  gallbladder lumen favored to reflect gallbladder sludge. 5. Colonic diverticular disease without CT evidence of active inflammation. 6. Prostatomegaly 7. Extensive atherosclerotic vascular calcifications without aneurysmal dilatation.   Electronically Signed   By: Jacqulynn Cadet M.D.   On: 08/06/2013 21:08   US Abdomen Complete  08/06/2013   CLINICAL DATA:  Right upper quadrant and right flank pain.  EXAM: ULTRASOUND ABDOMEN COMPLETE  COMPARISON:  CT abdomen/pelvis 04/11/2010  FINDINGS: Gallbladder:  No gallstones or wall thickening visualized. No sonographic Murphy sign noted.  Common bile duct:  Diameter: 4 mm.  Liver:  No focal lesion identified. Within normal limits in parenchymal echogenicity.  IVC:  No abnormality visualized.  Pancreas:  Visualized portion unremarkable.  Spleen:  Size and appearance within normal limits.  Right Kidney:  Length: 10.9 cm. Echogenicity within normal limits. No mass or hydronephrosis visualized.  Left Kidney:  Length: 11.0 cm. Echogenicity within normal limits. No mass or hydronephrosis visualized.  Abdominal aorta:  Mid to distal segment not well visualized due to abundant overlying bowel gas. Proximal segment within normal.  Other findings:  None.  IMPRESSION: No acute findings.   Electronically Signed   By: Marin Olp M.D.   On: 08/06/2013 18:05   Dg Abd Acute W/chest  08/07/2013   CLINICAL DATA:  Upper abdominal pain, diarrhea, sore throat  EXAM: ACUTE ABDOMEN SERIES (ABDOMEN 2 VIEW & CHEST 1 VIEW)  COMPARISON:  CT abdomen/ pelvis 08/06/2013  FINDINGS: Cardiac and mediastinal contours within normal limits. Patient is status post median sternotomy with evidence of prior CABG. Mild bibasilar atelectasis. Trace chronic bronchitic changes. Otherwise, the lungs are clear. Surgical clips noted in the soft tissues of the right neck. No acute osseous abnormality.  Numerous loops of air-filled and dilated small bowel throughout the mid abdomen remain concerning for small  bowel obstruction. Maximum small bowel diameter 5 cm by conventional x-ray which may be an overestimate. There are multiple small air-fluid levels on the upright radiograph. No free air.  IMPRESSION:  Persistent gaseous distension of small bowel loops with multiple air-fluid levels remains concerning for small bowel obstruction. Enteritis with ileus could appear similar.   Electronically Signed   By: Jacqulynn Cadet M.D.   On: 08/07/2013 02:02    Microbiology: Recent Results (from the past 240 hour(s))  CLOSTRIDIUM DIFFICILE BY PCR     Status: None   Collection Time    08/07/13 11:27 AM      Result Value Ref Range Status   C difficile by pcr NEGATIVE  NEGATIVE Final   Comment: Performed at [facility]     Status: None   Collection Time    08/07/13 11:27 AM      Result Value Ref Range Status   Specimen Description STOOL   Final   Special Requests Normal   Final   Culture     Final   Value: NO SUSPICIOUS COLONIES, CONTINUING TO HOLD     Performed at [facility]   Report Status PENDING   Incomplete     Labs: Basic Metabolic Panel:  Recent Labs Lab 08/06/13 1640 08/07/13 0541 08/08/13 0742 08/09/13 0400  NA 132* 135* 137 139  K 5.1 4.4 4.0 3.8  CL 94* 101 103 104  CO2 16* 16* 21 22  GLUCOSE 175* 110* 100* 136*  BUN 68* 50* 29* 15  CREATININE 2.70* 1.61* 1.37* 1.11  CALCIUM 9.7 8.6 8.7 8.6   Liver Function Tests:  Recent Labs Lab 08/06/13 1640  AST 22  ALT 20  ALKPHOS 69  BILITOT 0.4  PROT 8.5*  ALBUMIN 4.5    Recent Labs Lab 08/06/13 1640  LIPASE 36   No results found for this basename: AMMONIA,  in the last 168 hours CBC:  Recent Labs Lab 08/06/13 1640 08/07/13 0541 08/08/13 0742 08/09/13 0400  WBC 9.2 4.0 4.4 4.8  NEUTROABS 7.5  --   --   --   HGB 14.5 12.4* 11.6* 11.6*  HCT 42.2 35.6* 34.4* 33.3*  MCV 84.4 83.4 84.7 83.7  PLT 233 181 187 188   Cardiac Enzymes: No results found for this basename: CKTOTAL, CKMB,  CKMBINDEX, TROPONINI,  in the last 168 hours BNP: BNP (last 3 results) No results found for this basename: PROBNP,  in the last 8760 hours CBG:  Recent Labs Lab 08/08/13 0824 08/08/13 1227 08/08/13 1646 08/08/13 2217 08/09/13 0726  GLUCAP 95 148* 143* 156* 133*    Time coordinating discharge: 45 minutes  Signed:  Thoren Hosang  [facility] 08/09/2013, 11:46 AM

## 2013-08-11 ENCOUNTER — Other Ambulatory Visit: Payer: Self-pay

## 2013-08-11 LAB — STOOL CULTURE: Special Requests: NORMAL

## 2013-08-23 ENCOUNTER — Ambulatory Visit: Payer: Medicare Other | Admitting: Family

## 2013-08-23 ENCOUNTER — Other Ambulatory Visit (HOSPITAL_COMMUNITY): Payer: Medicare Other

## 2013-08-24 ENCOUNTER — Ambulatory Visit (INDEPENDENT_AMBULATORY_CARE_PROVIDER_SITE_OTHER): Payer: Medicare Other | Admitting: Family Medicine

## 2013-08-24 ENCOUNTER — Ambulatory Visit: Payer: Medicare Other | Admitting: Family

## 2013-08-24 ENCOUNTER — Encounter: Payer: Self-pay | Admitting: Family Medicine

## 2013-08-24 ENCOUNTER — Other Ambulatory Visit: Payer: Medicare Other

## 2013-08-24 VITALS — BP 120/66 | HR 72 | Wt 191.0 lb

## 2013-08-24 DIAGNOSIS — K560 Paralytic ileus: Secondary | ICD-10-CM

## 2013-08-24 DIAGNOSIS — E1159 Type 2 diabetes mellitus with other circulatory complications: Secondary | ICD-10-CM

## 2013-08-24 DIAGNOSIS — N179 Acute kidney failure, unspecified: Secondary | ICD-10-CM

## 2013-08-24 DIAGNOSIS — K567 Ileus, unspecified: Secondary | ICD-10-CM

## 2013-08-24 DIAGNOSIS — E1151 Type 2 diabetes mellitus with diabetic peripheral angiopathy without gangrene: Secondary | ICD-10-CM

## 2013-08-24 DIAGNOSIS — A080 Rotaviral enteritis: Secondary | ICD-10-CM

## 2013-08-24 MED ORDER — METOPROLOL TARTRATE 50 MG PO TABS: 50.0000 mg | ORAL_TABLET | Freq: Two times a day (BID) | ORAL | Status: DC

## 2013-08-24 NOTE — Progress Notes (Signed)
Subjective:    Patient ID: Clayton Harrison, male    DOB: 05-05-1931, 80 y.o.   MRN: [id number]  HPI Here today for hospital followup. He was admitted to [facility] and diagnosed with rotavirus with an ileus. At that time they did stop his lisinopril because his GFR was 21 creatinine was 2.7. They also stopped his glipizide because his A1c looks great at 6.2. He had not been having any hypoglycemic events. He says his bowels and stools are back to normal. He never saw any blood with bowel movements. He is eating normally. They initially recommended in sure that he has not been drinking as as he has been eating normally. He has been taking Metamucil and MiraLax to help move his bowels and keep a more regular. He did back down his dosing on both of these because the stools were becoming watery again. They are normal now.  Diabetes-no hypoglycemic events. Glucose this morning was 190 day and apple pie from [employer] which is very unusual for him for dinner last night. Typically his morning glucose was 150. No cuts or sores or wounds that are not healing well. Taking medications as prescribed.  Review of Systems  BP 120/66  Pulse 72  Wt 191 lb (86.637 kg)  SpO2 96%    Allergies  Allergen Reactions  . Shellfish Allergy Anaphylaxis    Swelling of the throat  . Atorvastatin     REACTION: urinary frequency  . Ezetimibe     REACTION: "hip problems"  . Januvia [Sitagliptin] Other (See Comments)    Nausea and vomiting  . Pravastatin Sodium     REACTION: myalgia  . Rosuvastatin   . Statins     Past Medical History  Diagnosis Date  . DM (diabetes mellitus)   . CAD (coronary artery disease)   . Vertigo   . HTN (hypertension)   . Other and unspecified hyperlipidemia   . Cerebrovascular disease, unspecified   . Elevated PSA   . Interstitial cystitis   . Statin intolerance   . Anemia, blood loss     history of  . PVD (peripheral vascular disease)   . Carotid artery occlusion   .  Adenomatous colon polyp     2.5cm excised by right colectomy 2011    Past Surgical History  Procedure Laterality Date  . Kidney stone surgery  1972  . Coronary artery bypass graft  2001    4  . Partial colectomy  05/24/2010    Lap converted to open right proximal colectomy  . Left carpal tunnel release  2009  . Carotid endarterectomy  2012    right  . Tonsillectomy  1940    History   Social History  . Marital Status: Married    Spouse Name: [name]    Number of Children: 2  . Years of Education: HS   Occupational History  . Retired    Social History Main Topics  . Smoking status: Former Smoker    Quit date: 11/23/1999  . Smokeless tobacco: Current User    Types: Chew  . Alcohol Use: No  . Drug Use: No  . Sexual Activity: Yes    Partners: Female   Other Topics Concern  . Not on file   Social History Narrative   No regular exercise. Daily caffeine- 3-4 cups daily          Family History  Problem Relation Age of Onset  . Diabetes Mother   . Heart disease Mother   .  Heart disease Father   . Colon cancer Maternal Uncle     x4    Outpatient Encounter Prescriptions as of 08/24/2013  Medication Sig  . acetaminophen (TYLENOL) 500 MG tablet Take 1,000 mg by mouth every 6 (six) hours as needed for mild pain.  Marland Kitchen amLODipine (NORVASC) 10 MG tablet Take 10 mg by mouth daily.  Marland Kitchen aspirin 325 MG tablet Take 325 mg by mouth daily.    . lansoprazole (PREVACID) 15 MG capsule Take 15 mg by mouth daily as needed (acid reflux).   . meclizine (ANTIVERT) 25 MG tablet Take 12.5 mg by mouth as needed for dizziness.  . metFORMIN (GLUCOPHAGE) 1000 MG tablet Take 1,000 mg by mouth 2 (two) times daily with a meal.  . metoprolol (LOPRESSOR) 50 MG tablet Take 1 tablet (50 mg total) by mouth 2 (two) times daily.  . Multiple Vitamins-Minerals (PRESERVISION AREDS 2 PO) Take 1 tablet by mouth 2 (two) times daily.   Vladimir Faster Glycol-Propyl Glycol (SYSTANE) 0.4-0.3 % SOLN Apply 2 drops to eye  2 (two) times daily as needed (dry eye).  . polyethylene glycol (MIRALAX / GLYCOLAX) packet Take 17 g by mouth daily.  . [DISCONTINUED] metoprolol (LOPRESSOR) 50 MG tablet Take 50 mg by mouth 2 (two) times daily.          Objective:   Physical Exam  Constitutional: He is oriented to person, place, and time. He appears well-developed and well-nourished.  HENT:  Head: Normocephalic and atraumatic.  Cardiovascular: Normal rate, regular rhythm and normal heart sounds.   Pulmonary/Chest: Effort normal and breath sounds normal.  Abdominal: Soft. Bowel sounds are normal. He exhibits no distension and no mass. There is no tenderness. There is no rebound and no guarding.  Neurological: He is alert and oriented to person, place, and time.  Skin: Skin is warm and dry.  Psychiatric: He has a normal mood and affect. His behavior is normal.          Assessment & Plan:  Rotavirus/ileus-resolved. Doing very well overall. He is back to normal diet. Continue matter MiraLax and Metamucil. Titrate as needed.  Acute kidney injury-we'll recheck creatinine today. His back to his baseline and okay to restart lisinopril.  Diabetes-last A1c was excellent at 6.2. We'll continue to hold the glipizide for now and see how he does over the next 3 months. Continue to monitor home blood sugars. If not fasting sugars are greater than 150 in the morning for several days then please let me know and we can go ahead and consider restarting the glipizide but at a lower dose. He was not having any hypoglycemic events on the medication. Does not need to use Ensure since eating normally.

## 2013-08-25 LAB — BASIC METABOLIC PANEL
BUN: 18 mg/dL (ref 6–23)
CO2: 29 mEq/L (ref 19–32)
Calcium: 9.6 mg/dL (ref 8.4–10.5)
Chloride: 102 mEq/L (ref 96–112)
Creat: 1.08 mg/dL (ref 0.50–1.35)
Glucose, Bld: 118 mg/dL — ABNORMAL HIGH (ref 70–99)
Potassium: 4.7 mEq/L (ref 3.5–5.3)
Sodium: 139 mEq/L (ref 135–145)

## 2013-08-26 ENCOUNTER — Ambulatory Visit: Payer: Medicare Other | Admitting: Family Medicine

## 2013-08-27 ENCOUNTER — Ambulatory Visit: Payer: Medicare Other | Admitting: Family Medicine

## 2013-08-31 ENCOUNTER — Encounter: Payer: Self-pay | Admitting: Family Medicine

## 2013-10-19 ENCOUNTER — Ambulatory Visit: Payer: Medicare Other | Admitting: Family Medicine

## 2013-10-26 ENCOUNTER — Other Ambulatory Visit: Payer: Self-pay | Admitting: Family Medicine

## 2013-11-25 ENCOUNTER — Encounter: Payer: Self-pay | Admitting: Family Medicine

## 2013-11-25 ENCOUNTER — Ambulatory Visit (INDEPENDENT_AMBULATORY_CARE_PROVIDER_SITE_OTHER): Payer: Medicare Other | Admitting: Family Medicine

## 2013-11-25 VITALS — BP 142/57 | HR 66 | Wt 193.0 lb

## 2013-11-25 DIAGNOSIS — E1151 Type 2 diabetes mellitus with diabetic peripheral angiopathy without gangrene: Secondary | ICD-10-CM

## 2013-11-25 DIAGNOSIS — I2510 Atherosclerotic heart disease of native coronary artery without angina pectoris: Secondary | ICD-10-CM

## 2013-11-25 DIAGNOSIS — E1159 Type 2 diabetes mellitus with other circulatory complications: Secondary | ICD-10-CM

## 2013-11-25 DIAGNOSIS — I10 Essential (primary) hypertension: Secondary | ICD-10-CM

## 2013-11-25 LAB — POCT GLYCOSYLATED HEMOGLOBIN (HGB A1C): Hemoglobin A1C: 7.2

## 2013-11-25 MED ORDER — MECLIZINE HCL 25 MG PO TABS: 12.5000 mg | ORAL_TABLET | ORAL | Status: DC | PRN

## 2013-11-25 MED ORDER — AMLODIPINE BESYLATE 10 MG PO TABS: ORAL_TABLET | ORAL | Status: DC

## 2013-11-25 NOTE — Progress Notes (Signed)
   Subjective:    Patient ID: Clayton Harrison, male    DOB: 1931/03/17, 80 y.o.   MRN: [id number]  HPI Diabetes - no hypoglycemic events. No wounds or sores that are not healing well. No increased thirst or urination. Checking glucose at home. Taking medications as prescribed without any side effects.   Has been eating some icecream.  Usually runs 150-157.   Hypertension- Pt denies chest pain, SOB, dizziness, or heart palpitations.  Taking meds as directed w/o problems.  Denies medication side effects.   No swelling.   On her disease. No chest pain shortness of breath. He does take an aspirin daily and is intolerant to statins.  Review of Systems     Objective:   Physical Exam  Constitutional: He is oriented to person, place, and time. He appears well-developed and well-nourished.  HENT:  Head: Normocephalic and atraumatic.  Cardiovascular: Normal rate, regular rhythm and normal heart sounds.   Pulmonary/Chest: Effort normal and breath sounds normal.  Neurological: He is alert and oriented to person, place, and time.  Skin: Skin is warm and dry.  Psychiatric: He has a normal mood and affect. His behavior is normal.          Assessment & Plan:  Diabetes-A1c is up today compared to previous. Thus the importance of diet and exercise to help control blood sugars. Recommend cut back on the ice cream. F/U in 3 months.  Lab Results  Component Value Date   HGBA1C 7.2 11/25/2013   Hypertension-well-controlled on current regimen.  CAD - Take an ASA. Inotlerant to statins.  He is also on metoprolol. Blood pressure is well-controlled. He has been asymptomatic.

## 2013-12-15 ENCOUNTER — Other Ambulatory Visit: Payer: Self-pay | Admitting: Family Medicine

## 2013-12-22 ENCOUNTER — Telehealth: Payer: Self-pay | Admitting: *Deleted

## 2013-12-22 NOTE — Telephone Encounter (Signed)
Pt's wife called and stated that he is having some back pain and wanted to know if it would be ok if she gave him a few of her pills zanaflex and mobic for his back. He has been using heating pad, tylenol, with no relief. I told her  try alternating with ice if no better try to bring him here tomorrow or go to [facility] or ED . She voiced understanding and agreed.Audelia Hives [place]

## 2013-12-30 ENCOUNTER — Other Ambulatory Visit: Payer: Self-pay | Admitting: Family Medicine

## 2014-01-11 LAB — LIPID PANEL
Cholesterol: 172 mg/dL (ref 0–200)
HDL: 43 mg/dL (ref >39)
LDL Cholesterol: 70 mg/dL (ref 0–99)
Total CHOL/HDL Ratio: 4 Ratio
Triglycerides: 295 mg/dL — ABNORMAL HIGH (ref <150)
VLDL: 59 mg/dL — ABNORMAL HIGH (ref 0–40)

## 2014-01-11 LAB — COMPLETE METABOLIC PANEL WITH GFR
ALT: 11 U/L (ref 0–53)
AST: 14 U/L (ref 0–37)
Albumin: 4.3 g/dL (ref 3.5–5.2)
Alkaline Phosphatase: 51 U/L (ref 39–117)
BUN: 17 mg/dL (ref 6–23)
CO2: 23 mEq/L (ref 19–32)
Calcium: 8.8 mg/dL (ref 8.4–10.5)
Chloride: 102 mEq/L (ref 96–112)
Creat: 1.09 mg/dL (ref 0.50–1.35)
GFR, Est African American: 74 mL/min
GFR, Est Non African American: 64 mL/min
Glucose, Bld: 164 mg/dL — ABNORMAL HIGH (ref 70–99)
Potassium: 4.6 mEq/L (ref 3.5–5.3)
Sodium: 138 mEq/L (ref 135–145)
Total Bilirubin: 0.3 mg/dL (ref 0.2–1.2)
Total Protein: 6.6 g/dL (ref 6.0–8.3)

## 2014-02-25 ENCOUNTER — Ambulatory Visit (INDEPENDENT_AMBULATORY_CARE_PROVIDER_SITE_OTHER): Payer: Medicare Other | Admitting: Family Medicine

## 2014-02-25 ENCOUNTER — Encounter: Payer: Self-pay | Admitting: Family Medicine

## 2014-02-25 VITALS — BP 119/71 | HR 65 | Wt 192.0 lb

## 2014-02-25 DIAGNOSIS — E1151 Type 2 diabetes mellitus with diabetic peripheral angiopathy without gangrene: Secondary | ICD-10-CM

## 2014-02-25 DIAGNOSIS — I798 Other disorders of arteries, arterioles and capillaries in diseases classified elsewhere: Secondary | ICD-10-CM

## 2014-02-25 DIAGNOSIS — R972 Elevated prostate specific antigen [PSA]: Secondary | ICD-10-CM

## 2014-02-25 DIAGNOSIS — I10 Essential (primary) hypertension: Secondary | ICD-10-CM

## 2014-02-25 DIAGNOSIS — Z23 Encounter for immunization: Secondary | ICD-10-CM

## 2014-02-25 DIAGNOSIS — E1159 Type 2 diabetes mellitus with other circulatory complications: Secondary | ICD-10-CM

## 2014-02-25 LAB — POCT GLYCOSYLATED HEMOGLOBIN (HGB A1C): Hemoglobin A1C: 7.4

## 2014-02-25 MED ORDER — METFORMIN HCL 1000 MG PO TABS: ORAL_TABLET | ORAL | Status: DC

## 2014-02-25 MED ORDER — LISINOPRIL 20 MG PO TABS: ORAL_TABLET | ORAL | Status: DC

## 2014-02-25 MED ORDER — METOPROLOL TARTRATE 50 MG PO TABS: ORAL_TABLET | ORAL | Status: DC

## 2014-02-25 MED ORDER — LANSOPRAZOLE 15 MG PO CPDR: 15.0000 mg | DELAYED_RELEASE_CAPSULE | Freq: Every day | ORAL | Status: DC | PRN

## 2014-02-25 NOTE — Assessment & Plan Note (Addendum)
Well controlled. a1c of 7.4 today. Was 7.2 last time.  On metformin.  On ACE, ASA.  Intolerant to statins. Discussed the importance of getting back on track with diet. We will see if he can get his A1c back down. Since he is under 8.0, based on his age. I consider him at goal.

## 2014-02-25 NOTE — Assessment & Plan Note (Signed)
Well controlled on regimen  

## 2014-02-25 NOTE — Progress Notes (Signed)
   Subjective:    Patient ID: SHAYE LAGACE, male    DOB: 04/13/1931, 80 y.o.   MRN: [id number]  HPI Hypertension- Pt denies chest pain, SOB, dizziness, or heart palpitations.  Taking meds as directed w/o problems.  Denies medication side effects.    Diabetes - no hypoglycemic events. No wounds or sores that are not healing well. No increased thirst or urination. Checking glucose at home. Taking medications as prescribed without any side effects.  Says has been eating cookies.    Review of Systems     Objective:   Physical Exam  Constitutional: He is oriented to person, place, and time. He appears well-developed and well-nourished.  HENT:  Head: Normocephalic and atraumatic.  Cardiovascular: Normal rate, regular rhythm and normal heart sounds.   Pulmonary/Chest: Effort normal and breath sounds normal.  Neurological: He is alert and oriented to person, place, and time.  Skin: Skin is warm and dry.  Psychiatric: He has a normal mood and affect. His behavior is normal.          Assessment & Plan:  Flu shot given today.

## 2014-02-25 NOTE — Assessment & Plan Note (Signed)
Levels  Have increased.  Has bx scheduled in 2 weeks iwht Dr. Jeffie Pollock at [facility]

## 2014-03-09 DIAGNOSIS — C61 Malignant neoplasm of prostate: Secondary | ICD-10-CM

## 2014-03-09 HISTORY — PX: PROSTATE BIOPSY: SHX241

## 2014-03-09 HISTORY — DX: Malignant neoplasm of prostate: C61

## 2014-03-11 ENCOUNTER — Other Ambulatory Visit: Payer: Self-pay | Admitting: Urology

## 2014-03-11 ENCOUNTER — Telehealth (INDEPENDENT_AMBULATORY_CARE_PROVIDER_SITE_OTHER): Payer: Self-pay

## 2014-03-11 DIAGNOSIS — C61 Malignant neoplasm of prostate: Secondary | ICD-10-CM

## 2014-03-11 NOTE — Telephone Encounter (Deleted)
No notes

## 2014-03-15 NOTE — Telephone Encounter (Signed)
No notes in encounter. 

## 2014-03-25 ENCOUNTER — Encounter (HOSPITAL_COMMUNITY)
Admission: RE | Admit: 2014-03-25 | Discharge: 2014-03-25 | Disposition: A | Discharge status: Home / Self Care | Payer: Medicare Other | Source: Ambulatory Visit | Attending: Urology | Admitting: Urology

## 2014-03-25 DIAGNOSIS — C61 Malignant neoplasm of prostate: Secondary | ICD-10-CM | POA: Insufficient documentation

## 2014-03-25 MED ORDER — TECHNETIUM TC 99M MEDRONATE IV KIT
26.0000 | PACK | Freq: Once | INTRAVENOUS | Status: AC | PRN
  Administered 2014-03-25: 26 via INTRAVENOUS

## 2014-04-14 ENCOUNTER — Encounter: Payer: Self-pay | Admitting: Family Medicine

## 2014-04-14 ENCOUNTER — Other Ambulatory Visit: Payer: Self-pay | Admitting: Urology

## 2014-04-14 ENCOUNTER — Encounter: Payer: Self-pay | Admitting: *Deleted

## 2014-04-14 DIAGNOSIS — C61 Malignant neoplasm of prostate: Secondary | ICD-10-CM

## 2014-04-14 NOTE — Progress Notes (Signed)
Called patient to introduce myself as Prostate Oncology Navigator and coordinator of the Prostate [place], and to: 1)  confirm his referral for the clinic on 04/19/14 with an arrival time of 12:30,  2)  explain format of clinic,  3)  confirm his understanding of [place] location, 4)  explain arrival and registration procedure, and   5)  indicate I am mailing an [place] to him that contains medical information forms which he should complete and bring with him on the day of clinic. I confirmed his mailing address. I provided my phone number and encouraged him to call me if he has any questions after receiving the Information Packet or prior to my call the day before clinic.  He verbalized understanding of information provided.  Gayleen Orem, RN, BSN, [place] at [place] [phone]

## 2014-04-15 ENCOUNTER — Encounter: Payer: Self-pay | Admitting: Radiation Oncology

## 2014-04-15 NOTE — Progress Notes (Signed)
GU Location of Tumor / Histology: prostate adenocarcinoma  If Prostate Cancer, Gleason Score is ( 4+ 5) and PSA is (10.66 on 01/17/14) 11/17/13 PSA 10.32 05/17/13 PSA 8.40  Clayton Harrison presented 11/2012 with signs/symptoms of: Elevated PSA, history of prostate induration, mild voiding symptoms, some intermittency  Biopsies of  prostate revealed:  03/09/14  Vol 98 mL   Past/Anticipated interventions by urology, if any: PSA surveillance  Past/Anticipated interventions by medical oncology, if any: none, will be seen in prostate [place] on 04/19/14  Weight changes, if any:   Bowel/Bladder complaints, if any:  IPSS 9  Nausea/Vomiting, if any: no  Pain issues, if any:  no  SAFETY ISSUES:  Prior radiation? no  Pacemaker/ICD? no  Possible current pregnancy? na  Is the patient on methotrexate? 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 No evidence of mets on staging studies.

## 2014-04-18 ENCOUNTER — Telehealth: Payer: Self-pay | Admitting: *Deleted

## 2014-04-18 NOTE — Telephone Encounter (Signed)
Called patient to see if he had any questions prior to his attendance at tomorrow's Prostate [place].  He stated he did not.  He confirmed his understanding of a 12:30 arrival time, that he would bring the completed health information forms he received, confirmed his understanding of the [facility] location and valet transportation to [facility] entrance.  Gayleen Orem, RN, BSN, [place] at [place] [phone] .

## 2014-04-19 ENCOUNTER — Encounter: Payer: Self-pay | Admitting: *Deleted

## 2014-04-19 ENCOUNTER — Encounter: Payer: Self-pay | Admitting: Radiation Oncology

## 2014-04-19 ENCOUNTER — Ambulatory Visit (HOSPITAL_BASED_OUTPATIENT_CLINIC_OR_DEPARTMENT_OTHER): Payer: Medicare Other | Admitting: Oncology

## 2014-04-19 ENCOUNTER — Ambulatory Visit
Admission: RE | Admit: 2014-04-19 | Discharge: 2014-04-19 | Disposition: A | Discharge status: Home / Self Care | Payer: Medicare Other | Source: Ambulatory Visit | Attending: Radiation Oncology | Admitting: Radiation Oncology

## 2014-04-19 VITALS — BP 151/64 | HR 63 | Temp 98.1°F | Resp 20 | Ht 71.0 in | Wt 195.0 lb

## 2014-04-19 DIAGNOSIS — K219 Gastro-esophageal reflux disease without esophagitis: Secondary | ICD-10-CM | POA: Diagnosis not present

## 2014-04-19 DIAGNOSIS — C61 Malignant neoplasm of prostate: Secondary | ICD-10-CM | POA: Diagnosis present

## 2014-04-19 DIAGNOSIS — Z720 Tobacco use: Secondary | ICD-10-CM | POA: Diagnosis not present

## 2014-04-19 DIAGNOSIS — Z7982 Long term (current) use of aspirin: Secondary | ICD-10-CM | POA: Insufficient documentation

## 2014-04-19 DIAGNOSIS — Z719 Counseling, unspecified: Secondary | ICD-10-CM | POA: Diagnosis not present

## 2014-04-19 DIAGNOSIS — E119 Type 2 diabetes mellitus without complications: Secondary | ICD-10-CM | POA: Insufficient documentation

## 2014-04-19 DIAGNOSIS — N3010 Interstitial cystitis (chronic) without hematuria: Secondary | ICD-10-CM | POA: Insufficient documentation

## 2014-04-19 DIAGNOSIS — R972 Elevated prostate specific antigen [PSA]: Secondary | ICD-10-CM | POA: Diagnosis not present

## 2014-04-19 DIAGNOSIS — I2510 Atherosclerotic heart disease of native coronary artery without angina pectoris: Secondary | ICD-10-CM | POA: Diagnosis not present

## 2014-04-19 DIAGNOSIS — Z8042 Family history of malignant neoplasm of prostate: Secondary | ICD-10-CM | POA: Insufficient documentation

## 2014-04-19 DIAGNOSIS — E780 Pure hypercholesterolemia: Secondary | ICD-10-CM | POA: Diagnosis not present

## 2014-04-19 DIAGNOSIS — I10 Essential (primary) hypertension: Secondary | ICD-10-CM | POA: Diagnosis not present

## 2014-04-19 HISTORY — DX: Unspecified hearing loss, unspecified ear: H91.90

## 2014-04-19 HISTORY — DX: Neoplasm of unspecified behavior of digestive system: D49.0

## 2014-04-19 HISTORY — DX: Unspecified cataract: H26.9

## 2014-04-19 HISTORY — DX: Gastro-esophageal reflux disease without esophagitis: K21.9

## 2014-04-19 HISTORY — DX: Unspecified malignant neoplasm of skin, unspecified: C44.90

## 2014-04-19 HISTORY — DX: Acute coronary thrombosis not resulting in myocardial infarction: I24.0

## 2014-04-19 HISTORY — DX: Unspecified intestinal obstruction, unspecified as to partial versus complete obstruction: K56.609

## 2014-04-19 HISTORY — DX: Pure hypercholesterolemia, unspecified: E78.00

## 2014-04-19 HISTORY — DX: Malignant neoplasm of prostate: C61

## 2014-04-19 HISTORY — DX: Unspecified macular degeneration: H35.30

## 2014-04-19 NOTE — Addendum Note (Signed)
Encounter addended by: Rexene Edison, MD on: 04/19/2014  7:06 PM<BR>     Documentation filed: Notes Section

## 2014-04-19 NOTE — Consult Note (Signed)
Chief Complaint  Prostate cancer   Reason For Visit  Reason for consult: To discuss treatment options for prostate cancer. Physician requesting consult: Dr. Irine Seal PCP: Dr. [name] Location of consult: [facility]   History of Present Illness  [name] is an 80 year old gentleman who has a history of a mildly elevated PSA with a high free percentage PSA thought to be secondary to BPH.  However, his PSA was noted to have increased above his baseline to 8.3 and subsequently to 10.66 along with a left mid prostate nodule prompting a prostate biopsy by Dr. Jeffie Pollock on 03/09/14 which confirmed Gleason 4+5=9 adenocarcinoma of the prostate with 8 out of 12 biopsy cores positive for malignancy. He had a CT scan of the pelvis and bone scan performed on 03/25/14 which did not show any evidence for obvious metastatic disease.   ** His medical comorbidities include a history of CAD, diabetes, colon cancer, GERD, hypercholesterolemia, hypertension, kidney stones, hyperthyroidism, and melanoma.  TNM stage: cT2a N0 M0 (left mid nodule) PSA: 10.66 Gleason score: 4+5=9 Biopsy (03/09/14): 8/12 cores positive    Left: L lateral apex (60%, 4+4=8, PNI), L lateral mid (90%, 4+3=7, PNI), L mid (80%, 4+5=9, PNI), L lateral base (90%, 4+4=8), L base (90%, 4+4=8, PNI)    Right: R mid (< 5%, 3+4=7), R base (80%, 4+4=8), R lateral base (80%, 4+3=7) Prostate volume: 98 cc  Urinary function: IPSS is 4. Erectile function: SHIM score is 15.   Past Medical History  1. History of Colon Cancer  2. History of cardiac disorder (Z86.79)  3. History of cataract (Z86.69)  4. History of diabetes mellitus (Z86.39)  5. History of esophageal reflux (Z87.19)  6. History of hypercholesterolemia (Z86.39)  7. History of hypertension (Z86.79)  8. History of kidney stones (Z87.442)  9. History of vertigo (Z87.898)  10. History of Hyperthyroidism (E05.90)  11. 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 History of Macular Degeneration  13. History of Malignant Melanoma Of The Skin  Surgical History  1. History of CABG (CABG)  2. History of Carotid Thromboendarterectomy  3. History of Colon Surgery  4. History of Dental Surgery  5. History of Hand Surgery  6. History of Heart Surgery  7. History of Tonsillectomy  8. History of Ureterolithotomy  Current Meds  1. AmLODIPine Besylate 10 MG Oral Tablet;  Therapy: (Recorded:02Jun2014) to Recorded  2. Aspirin 325 MG Oral Tablet;  Therapy: (Recorded:28Dec2007) to Recorded  3. Lisinopril 20 MG Oral Tablet;  Therapy: (Recorded:28Dec2007) to Recorded  4. Meclizine HCl - 25 MG Oral Tablet;  Therapy: (Recorded:02Jun2014) to Recorded  5. MetFORMIN HCl - 1000 MG Oral Tablet;  Therapy: (Recorded:28Dec2007) to Recorded  6. Metoprolol Tartrate 50 MG Oral Tablet;  Therapy: (Recorded:28Dec2007) to Recorded  7. Multi-Vitamin Oral Tablet;  Therapy: (Recorded:02Jun2014) to Recorded  8. PredniSONE 50 MG Oral Tablet; Take 50 mg PO 13, 7, and 1 hour prior to procedure;  Therapy: 18Sep2015 to (Last Rx:18Sep2015)  Requested for: 18Sep2015 Ordered  9. PreserVision AREDS Oral Capsule;  Therapy: (Recorded:02Jun2014) to Recorded  10. Prevacid 15 MG Oral Capsule Delayed Release;   Therapy: (Recorded:02Jun2014) to Recorded  Allergies  1. Atorvastatin Calcium TABS  2. Januvia TABS  3. Pravastatin Sodium TABS  4. Statins  5. Vytorin TABS  6. Zetia TABS  7. Shellfish  Family History  1. Family history of Colon Cancer  2. Family history of Coronary Artery Disease : Mother  3. Family  history of Death In The Family Father  4. Family history of Family Health Status Number Of Children  Social History   Denied: Alcohol Use   Family history of Death In The Family Mother   Former smoker [phone])   Marital History - Currently Married   Occupation:   History of Tobacco Use  Physical Exam Constitutional: Well  nourished and well developed . No acute distress.    Results/Data  I independently reviewed his medical records, PSA results, pathology report and slides, and imaging studies today.  Findings are as outlined above.     Assessment  1. Prostate cancer (C61)  Discussion/Summary  1.  Prostate cancer: I had a long and detailed discussion with Clayton Harrison and multiple family members who were present at his visit today.   The patient was counseled about the natural history of prostate cancer and the standard treatment options that are available for prostate cancer. It was explained to him how his age and life expectancy, clinical stage, Gleason score, and PSA affect his prognosis, the decision to proceed with additional staging studies, as well as how that information influences recommended treatment strategies. We discussed the roles for active surveillance, radiation therapy, surgical therapy, androgen deprivation, as well as ablative therapy options for the treatment of prostate cancer as appropriate to his individual cancer situation. We discussed the risks and benefits of these options with regard to their impact on cancer control and also in terms of potential adverse events, complications, and impact on quiality of life particularly related to urinary, bowel, and sexual function. The patient was encouraged to ask questions throughout the discussion today and all questions were answered to his stated satisfaction. In addition, the patient was provided with and/or directed to appropriate resources and literature for further education about prostate cancer and treatment options.   We discussed his advanced age and his multiple medical comorbidities which do give him a limited life expectancy.  However, he is not a very frail gentleman remains fairly active disease.  We also discussed that he has a very high-grade prostate cancer that very likely could cause serious symptoms or death even within 5 years  potentially.  We discussed options of proceeding with therapy of curative intent versus observation and systemic therapy upon the development of metastatic disease.  After reviewing the pros and cons of each approach, he does wish to proceed with curative therapy.  He understands that the optimal treatment for cure considering his advanced age and comorbidities would be external beam radiation therapy.  He also understands that with his high-grade disease, he would be best served with concomitant androgen deprivation therapy.  Considering his cardiovascular disease, he understands the potential increased toxicity associated with   androgen deprivation therapy and we discussed the option of potentially using a shorter duration of treatment and possibly considering LHRH antagonist as preferable treatment considering his past history for significant cardiovascular disease.  He does wish to proceed with this treatment approach.  He is scheduled see Dr. Valere Dross later today.  I will make Dr. Jeffie Pollock aware of his preference for treatment.  All questions were answered to the patient's satisfaction.  A total of 50 minutes were spent in the overall care of the patient today with 35 minutes in direct face to face consultation.    Signatures Electronically signed by : Raynelle Bring, M.D.; Apr 19 2014  5:56PM EST

## 2014-04-19 NOTE — Progress Notes (Signed)
Met with patient as part of Prostate [facility].  Reintroduced my role as his navigator and encouraged him to call as he proceeds with treatments and appointments at [facility].  Provided the accompanying Care Plan Summary:    Care Plan Summary  Name: Clayton Harrison DOB: April 06, 1931  Your Medical Team:   Urologist - Dr. Raynelle Bring, [facility]  Radiation Oncologist - Dr. Arloa Koh, [facility]   Medical Oncologist - Dr. Zola Button, [place]  Recommendations: 1) Androgen Deprivation Therapy (ADT) + Radiation Therapy (RT). * These recommendations are based on information available as of today's consult.      Recommendations may change depending on the results of further tests or exams.  Next Steps: 1) Dr. Ralene Muskrat office will contact you to start ADT. 2) Dr. Valere Dross will see you in about 2 months to start RT.  When appointments need to be scheduled, you will be contacted by [facility] and/or [facility].  Questions?  Please do not hesitate to call Gayleen Orem, RN, BSN, [facility] at [phone] with any questions or concerns.  Liliane Channel is Counsellor and is available to assist you while you're receiving your medical care at [facility].   I encouraged him to call me with any questions or concerns as his treatments progress.  He indicated understanding.  Gayleen Orem, RN, BSN, [place] at [place] [phone]

## 2014-04-19 NOTE — Progress Notes (Signed)
Reason for Referral: Prostate cancer.   HPI: 80 year old gentleman currently of [place] where he lives majority of his life. He is a retired Agricultural consultant also served in [employer]. He was noted to have an elevated PSA and most recently was high as 10.3 in May 2015. He did have mild worsening symptoms including intermittency and subsequently underwent a prostate biopsy by Dr. Jeffie Pollock. the biopsy showed prostate cancer with a Gleason score of 4+5 = 9 and some coarse with occasional 4+4 = 8 course. He has rather extensive disease involving total 8 cores. His staging workup including a bone scan done on 03/25/2014 as well as a CT scan of the abdomen and pelvis did not show any evidence of metastasis. Patient was referred to the prostate cancer multidisciplinary clinic for discussion for treatment options.  Clinically, the patient is asymptomatic. He does not report any fevers or chills or sweats. Does not report any weight loss or constitutional symptoms. Despite his age and comorbid conditions, he still relatively active able to drive and attends to her activities of daily living. He does not report any chest pain, shortness of breath, orthopnea or PND. He does not report any cough or hemoptysis. He does not report any nausea, vomiting or weight loss. He does not report any hematuria or dysuria. He does report some occasional nocturia and frequency. He does not report any skeletal complaints. Rest of his review of systems unremarkable.   Past Medical History  Diagnosis Date  . DM (diabetes mellitus)   . CAD (coronary artery disease)   . Vertigo   . HTN (hypertension)   . Other and unspecified hyperlipidemia   . Cerebrovascular disease, unspecified   . Elevated PSA   . Interstitial cystitis   . Statin intolerance   . Anemia, blood loss     history of  . PVD (peripheral vascular disease)   . Carotid artery occlusion   . Adenomatous colon polyp     2.5cm excised by right colectomy 2011  . Prostate  cancer 03/09/14    Gleason 4+5=9, volume 98 mL  . Cataract   . GERD (gastroesophageal reflux disease)   . Hypercholesteremia   . Hearing loss   . Blockage of coronary artery of heart 2001    x 4  . Colon tumor 03/2010    right ascending  . Macular degeneration   . Skin cancer 2012    melanoma upper back, removed  . SBO (small bowel obstruction) 07/2013  :  Past Surgical History  Procedure Laterality Date  . Kidney stone surgery  1972  . Coronary artery bypass graft  2001    4  . Partial colectomy  05/24/2010    Lap converted to open right proximal colectomy  . Left carpal tunnel release  2009  . Carotid endarterectomy  2012    right  . Tonsillectomy  1940  . Prostate biopsy  03/09/14    Gleason 9, vol 98 mL  . Dental surgery    . Ureterolithotomy    . Skin cancer excision  02/2011    upper back  . Cardiac catheterization  11/1999  . Appendectomy  05/2010    with right hemicolectomy  . Dental extractions Bilateral 1980  :  Current outpatient prescriptions:acetaminophen (TYLENOL) 500 MG tablet, Take 1,000 mg by mouth every 6 (six) hours as needed for mild pain., Disp: , Rfl: ;  amLODipine (NORVASC) 10 MG tablet, TAKE 1 TABLET BY MOUTH DAILY., Disp: 90 tablet, Rfl: 1;  aspirin 325  MG tablet, Take 325 mg by mouth daily.  , Disp: , Rfl: ;  lansoprazole (PREVACID) 15 MG capsule, Take 1 capsule (15 mg total) by mouth daily as needed (acid reflux)., Disp: 90 capsule, Rfl: 1 lisinopril (PRINIVIL,ZESTRIL) 20 MG tablet, TAKE 1 TABLET (20 MG TOTAL) BY MOUTH AT BEDTIME., Disp: 90 tablet, Rfl: 1;  meclizine (ANTIVERT) 25 MG tablet, Take 0.5 tablets (12.5 mg total) by mouth as needed for dizziness., Disp: 30 tablet, Rfl: 0;  metFORMIN (GLUCOPHAGE) 1000 MG tablet, TAKE 1 TABLET BY MOUTH 2 TIMES DAILY WITH A MEAL, Disp: 180 tablet, Rfl: 1 metoprolol (LOPRESSOR) 50 MG tablet, TAKE 1 TABLET (50 MG TOTAL) BY MOUTH 2 (TWO) TIMES DAILY., Disp: 180 tablet, Rfl: 1;  Multiple Vitamins-Minerals  (PRESERVISION AREDS 2 PO), Take 1 tablet by mouth 2 (two) times daily. , Disp: , Rfl: ;  Polyethyl Glycol-Propyl Glycol (SYSTANE) 0.4-0.3 % SOLN, Apply 2 drops to eye 2 (two) times daily as needed (dry eye)., Disp: , Rfl: :  Allergies  Allergen Reactions  . Shellfish Allergy Anaphylaxis    Swelling of the throat  . Atorvastatin     REACTION: urinary frequency  . Ezetimibe     REACTION: "hip problems"  . Januvia [Sitagliptin] Other (See Comments)    Nausea and vomiting  . Pravastatin Sodium     REACTION: myalgia  . Rosuvastatin   . Statins   :  Family History  Problem Relation Age of Onset  . Diabetes Mother   . Heart disease Mother   . Heart failure Mother   . Hypertension Mother   . Heart disease Father   . Colon cancer Maternal Uncle     x4  . Cancer Maternal Uncle     colon  . Cancer Daughter     breast  . Cancer Daughter     breast  . Cancer Cousin     colon  :  History   Social History  . Marital Status: Married    Spouse Name: [name]    Number of Children: 2  . Years of Education: HS   Occupational History  . Retired    Social History Main Topics  . Smoking status: Former Smoker -- 2.00 packs/day for 40 years    Quit date: 11/23/1999  . Smokeless tobacco: Current User    Types: Chew  . Alcohol Use: No  . Drug Use: No  . Sexual Activity: Yes    Partners: Female   Other Topics Concern  . Not on file   Social History Narrative   No regular exercise. Daily caffeine- 3-4 cups daily        :  Pertinent items are noted in HPI.  Exam: ECOG 1 There were no vitals taken for this visit. General appearance: alert and cooperative Throat: lips, mucosa, and tongue normal; teeth and gums normal Neck: no adenopathy Back: negative Cardio: regular rate and rhythm, S1, S2 normal, no murmur, click, rub or gallop GI: soft, non-tender; bowel sounds normal; no masses,  no organomegaly Extremities: extremities normal, atraumatic, no cyanosis or edema Pulses:  2+ and symmetric  Nm Bone Scan Whole Body  03/25/2014   CLINICAL DATA:  Malignant neoplasm of prostate newly diagnosed. PSA 10.66. Evaluate for metastatic disease. No pain or recent trauma.  EXAM: NUCLEAR MEDICINE WHOLE BODY BONE SCAN  TECHNIQUE: Whole body anterior and posterior images were obtained approximately 3 hours after intravenous injection of radiopharmaceutical.  RADIOPHARMACEUTICALS:  26.0 mCi Technetium-99 MDP  COMPARISON:  CT pelvis  03/25/2014 and CT abdomen/ pelvis 08/06/2013.  FINDINGS: Examination demonstrates normal uniform uptake of radiotracer within the bones, soft tissues and genitourinary system. There is no focal radiotracer uptake to suggest osseous metastatic disease.  IMPRESSION: Normal bone scan.   Electronically Signed   By: Marin Olp M.D.   On: 03/25/2014 13:31    Assessment and Plan:   80 year old gentleman with diagnosis of prostate cancer Gleason score 4+5 = 9 with high-volume disease and clinical stage of T2a. He has high risk disease is obvious by his PSA of 10.66 and high Gleason score. His IPSS score is 4 at this time. His case was discussed and the prostate cancer multidisciplinary clinic. Imaging studies were reviewed with radiology. He is pathology slides were reviewed by pathology. Treatment options were discussed with the patient and it was felt that he would be a poor surgical candidate at this time. Given his age, coronary disease, peripheral vascular disease and the fact that his cure rate is very low at this time. He is motivated and wants to consider curative therapy think would be reasonable to consider radiation therapy at this time. I think he has a reasonable performance status and life expectancy to consider that. I see no contraindication for using short-term androgen deprivation for a total of 6 months or so.  I also counseled him today about his risk of developing metastatic disease. I explained to him that there are no curative options at that  time but certainly he would be a reasonable candidate to consider hormonal agents for palliation of symptoms. All his questions were answered today and he will consider radiation as a definitive modality

## 2014-04-19 NOTE — Progress Notes (Addendum)
[facility] NEW PATIENT EVALUATION  Name: Clayton Harrison MRN: [id number]  Date:   04/19/2014           DOB: September 16, 1930  Status: outpatient   CC: METHENEY,CATHERINE, MD  Raynelle Bring, MD Dr. Irine Seal   REFERRING PHYSICIAN: Raynelle Bring, MD, Dr. 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 male who is seen today at the prostate multidisciplinary clinic through the courtesy of Dr. Irine Seal and also Dr. Dutch Gray for evaluation of his stageT2a high risk adenocarcinoma the prostate. His PSA was 6.2 in June 2008 rising to 8.4 by November 2014, and 10.66 on 01/17/2014. Dr. Jeffie Pollock noted a nodule along the right apex and therefore he underwent ultrasound-guided biopsies on 03/09/2014. He was found to have Gleason 9 (4+5) adenocarcinoma involving 80% of one core from the left mid gland. He had Gleason 8 (4+4) involving 90% of one core from the left lateral base, 80% of one core from the right base, 90% of one core from the left base and 60% of one core from left lateral apex. He also Gleason 7 (4+3) involving 80% of one core from right lateral base and 90% of one core from left lateral mid gland. He had Gleason 7 (3+4) involving 90% of one core from left lateral mid gland. His gland volume was 98 mL. He is doing reasonably well from a GU and GI standpoint. His PSA score today is 9. His PSA score was 4 on 04/13/2014. He has erectile dysfunction. His staging workup included a bone scan and CT scans which were without evidence for metastatic disease. He has multiple medical comorbidities including diabetes and coronary artery disease. He seen today with Dr. Alen Blew and Dr. Alinda Money.   PREVIOUS RADIATION THERAPY: No   PAST MEDICAL HISTORY:  has a past medical history of DM (diabetes mellitus); CAD (coronary artery disease); Vertigo; HTN (hypertension); Other and unspecified  hyperlipidemia; Cerebrovascular disease, unspecified; Elevated PSA; Interstitial cystitis; Statin intolerance; Anemia, blood loss; PVD (peripheral vascular disease); Carotid artery occlusion; Adenomatous colon polyp; Prostate cancer (03/09/14); Cataract; GERD (gastroesophageal reflux disease); Hypercholesteremia; Hearing loss; Blockage of coronary artery of heart (2001); Colon tumor (03/2010); Macular degeneration; Skin cancer (2012); and SBO (small bowel obstruction) (07/2013).     PAST SURGICAL HISTORY:  Past Surgical History  Procedure Laterality Date  . Kidney stone surgery  1972  . Coronary artery bypass graft  2001    4  . Partial colectomy  05/24/2010    Lap converted to open right proximal colectomy  . Left carpal tunnel release  2009  . Carotid endarterectomy  2012    right  . Tonsillectomy  1940  . Prostate biopsy  03/09/14    Gleason 9, vol 98 mL  . Dental surgery    . Ureterolithotomy    . Skin cancer excision  02/2011    upper back  . Cardiac catheterization  11/1999  . Appendectomy  05/2010    with right hemicolectomy  . Dental extractions Bilateral 1980     FAMILY HISTORY: family history includes Cancer in his cousin, daughter, daughter, and maternal uncle; Colon cancer in his maternal uncle; Diabetes in his mother; Heart disease in his father and mother; Heart failure in his mother; Hypertension in his mother. A maternal uncle and cousins were diagnosed with prostate cancer in their 39s and 53s. His mother died  from cardiac disease at 28. His father's health is unknown.   SOCIAL HISTORY:  reports that he quit smoking about 14 years ago. His smokeless tobacco use includes Chew. He reports that he does not drink alcohol or use illicit drugs. Married, 2 children. Retired Airline pilot.   ALLERGIES: Shellfish allergy; Atorvastatin; Ezetimibe; Januvia; Pravastatin sodium; Rosuvastatin; and Statins   MEDICATIONS:  Current Outpatient Prescriptions  Medication Sig Dispense  Refill  . acetaminophen (TYLENOL) 500 MG tablet Take 1,000 mg by mouth every 6 (six) hours as needed for mild pain.      Marland Kitchen amLODipine (NORVASC) 10 MG tablet TAKE 1 TABLET BY MOUTH DAILY.  90 tablet  1  . aspirin 325 MG tablet Take 325 mg by mouth daily.        . meclizine (ANTIVERT) 25 MG tablet Take 0.5 tablets (12.5 mg total) by mouth as needed for dizziness.  30 tablet  0  . metFORMIN (GLUCOPHAGE) 1000 MG tablet TAKE 1 TABLET BY MOUTH 2 TIMES DAILY WITH A MEAL  180 tablet  1  . metoprolol (LOPRESSOR) 50 MG tablet TAKE 1 TABLET (50 MG TOTAL) BY MOUTH 2 (TWO) TIMES DAILY.  180 tablet  1  . Multiple Vitamins-Minerals (PRESERVISION AREDS 2 PO) Take 1 tablet by mouth 2 (two) times daily.       . lansoprazole (PREVACID) 15 MG capsule Take 1 capsule (15 mg total) by mouth daily as needed (acid reflux).  90 capsule  1  . lisinopril (PRINIVIL,ZESTRIL) 20 MG tablet TAKE 1 TABLET (20 MG TOTAL) BY MOUTH AT BEDTIME.  90 tablet  1  . Polyethyl Glycol-Propyl Glycol (SYSTANE) 0.4-0.3 % SOLN Apply 2 drops to eye 2 (two) times daily as needed (dry eye).       No current facility-administered medications for this encounter.     REVIEW OF SYSTEMS:  Pertinent items are noted in HPI.    PHYSICAL EXAM:  height is 5\' 11"  (1.803 m) and weight is 195 lb (88.451 kg). His oral temperature is 98.1 F (36.7 C). His blood pressure is 151/64 and his pulse is 63. His respiration is 20.   Alert and oriented 80 year old white male appearing his stated age. Physical examination is not performed today. He does have a palpable right prostatic nodule.   LABORATORY DATA:  Lab Results  Component Value Date   WBC 4.8 08/09/2013   HGB 11.6* 08/09/2013   HCT 33.3* 08/09/2013   MCV 83.7 08/09/2013   PLT 188 08/09/2013   Lab Results  Component Value Date   NA 138 01/11/2014   K 4.6 01/11/2014   CL 102 01/11/2014   CO2 23 01/11/2014   Lab Results  Component Value Date   ALT 11 01/11/2014   AST 14 01/11/2014   ALKPHOS 51  01/11/2014   BILITOT 0.3 01/11/2014   PSA 10.66 from 01/17/2014   IMPRESSION: Clinical stage T2a high risk adenocarcinoma prostate. I explained to the patient and his family that his prognosis is related to his stage, Gleason score, and PSA level. His stage is favorable, his PSA level is 10.66, borderline favorable (although his prostate volume is 98 mL which is probably contributing to a significant component of his PSA), but more importantly, he has a Gleason score of 9 along with high-volume disease. We discussed management options which include observation with or without androgen deprivation therapy versus radiation therapy with androgen deprivation therapy. Considering his age, and cardiac history he is not a candidate for a seed implant boost. By  default he is a candidate for external beam/IMRT. I do recommend androgen deprivation therapy which can be given for at least 6 months to a year although the standard of care is 2 years. Considering his age it may take some time for his testosterone to recover, and therefore one year therapy is probably more than adequate. We discussed the potential side effects and toxicities of androgen deprivation therapy. We discussed the potential acute and late toxicities of radiation therapy. I will contact Dr. Jeffie Pollock and get him started on androgen deprivation therapy. We will also get him scheduled for placement of 3 gold seed markers for image guidance. I will see him for a follow-up visit in approximately 2 months. All of the patient's and family's questions were answered.  PLAN: As discussed above.   I spent 30 minutes face to face with the patient and more than 50% of that time was spent in counseling and/or coordination of care.

## 2014-04-20 ENCOUNTER — Telehealth: Payer: Self-pay | Admitting: *Deleted

## 2014-04-20 ENCOUNTER — Encounter: Payer: Self-pay | Admitting: Specialist

## 2014-04-20 NOTE — Telephone Encounter (Signed)
CALLED PATIENT TO INFORM OF GOLD SEED PLACEMENT ON  06-01-14- ARRIVAL TIME - 9 AM @ DR. WRENN'S OFFICE AND HIS [place] VISIT FOR 06-21-14 - 12:30 PM FOR NURSE, LVM FOR A RETURN CALL

## 2014-04-20 NOTE — Progress Notes (Signed)
[facility] Psychosocial Distress Screening Spiritual Care and Wholeness  Chaplain conducted distress screening protocol in [facility] [place].  The patient scored a 0 on the Psychosocial Distress Thermometer which indicates No distress. Chaplain assessed for distress and other psychosocial needs in clinic. Patient has good family and spiritual support. Educated the patient about the [employer] and services, particularly the Prostate Cancer Support Group. Provided him with my contact information and a flyer on the support group.    Clinical Social Worker follow up needed: No.  If yes, follow up plan:  [place] 04/20/2014  Screening Type Initial Screening  Mark the number that describes how much distress you have been experiencing in the past week 0  Referral to support programs Yes  Other Patient said he did not need to check any needs    Patient came with family. Wife, daughters, and son-in-law were also present. One of his daughters is a Therapist, sports. Two of his other daughters and his wife have had breast cancer and are family is familiar with many of the support activities. Patient also talked about his faith and how that is a strong coping mechanism and source of hope for him.  Epifania Gore, PhD, [place]

## 2014-04-21 NOTE — Addendum Note (Signed)
Encounter addended by: Andria Rhein, RN on: 04/21/2014  1:25 PM<BR>     Documentation filed: Charges VN

## 2014-04-29 ENCOUNTER — Ambulatory Visit
Admission: RE | Admit: 2014-04-29 | Discharge: 2014-04-29 | Disposition: A | Discharge status: Home / Self Care | Payer: Medicare Other | Source: Ambulatory Visit | Attending: Urology | Admitting: Urology

## 2014-04-29 DIAGNOSIS — C61 Malignant neoplasm of prostate: Secondary | ICD-10-CM

## 2014-05-09 NOTE — Progress Notes (Signed)
HPI: FU coronary artery disease, status post coronary artery bypass graft in June 2001. Last Myoview in May 2014 revealed EF of 53% and fixed apical perfusion defect representing prior MI versus thinning. Abdominal ultrasound in June of 2006 showed no aneurysm. Since I last saw him, the patient denies any dyspnea on exertion, orthopnea, PND, pedal edema, palpitations, syncope or chest pain.   Current Outpatient Prescriptions  Medication Sig Dispense Refill  . acetaminophen (TYLENOL) 500 MG tablet Take 1,000 mg by mouth every 6 (six) hours as needed for mild pain.    Marland Kitchen amLODipine (NORVASC) 10 MG tablet TAKE 1 TABLET BY MOUTH DAILY. 90 tablet 1  . aspirin 325 MG tablet Take 325 mg by mouth daily.      . Degarelix Acetate (FIRMAGON [place]) Inject 80 mg into the skin every 30 (thirty) days.    . lansoprazole (PREVACID) 15 MG capsule Take 1 capsule (15 mg total) by mouth daily as needed (acid reflux). 90 capsule 1  . lisinopril (PRINIVIL,ZESTRIL) 20 MG tablet TAKE 1 TABLET (20 MG TOTAL) BY MOUTH AT BEDTIME. 90 tablet 1  . meclizine (ANTIVERT) 25 MG tablet Take 0.5 tablets (12.5 mg total) by mouth as needed for dizziness. 30 tablet 0  . metFORMIN (GLUCOPHAGE) 1000 MG tablet TAKE 1 TABLET BY MOUTH 2 TIMES DAILY WITH A MEAL 180 tablet 1  . metoprolol (LOPRESSOR) 50 MG tablet TAKE 1 TABLET (50 MG TOTAL) BY MOUTH 2 (TWO) TIMES DAILY. 180 tablet 1  . Multiple Vitamins-Minerals (PRESERVISION AREDS 2 PO) Take 1 tablet by mouth 2 (two) times daily.     Vladimir Faster Glycol-Propyl Glycol (SYSTANE) 0.4-0.3 % SOLN Apply 2 drops to eye 2 (two) times daily as needed (dry eye).     No current facility-administered medications for this visit.     Past Medical History  Diagnosis Date  . DM (diabetes mellitus)   . CAD (coronary artery disease)   . Vertigo   . HTN (hypertension)   . Other and unspecified hyperlipidemia   . Cerebrovascular disease, unspecified   . Elevated PSA   . Interstitial cystitis   .  Statin intolerance   . Anemia, blood loss     history of  . PVD (peripheral vascular disease)   . Carotid artery occlusion   . Adenomatous colon polyp     2.5cm excised by right colectomy 2011  . Prostate cancer 03/09/14    Gleason 4+5=9, volume 98 mL  . Cataract   . GERD (gastroesophageal reflux disease)   . Hypercholesteremia   . Hearing loss   . Blockage of coronary artery of heart 2001    x 4  . Colon tumor 03/2010    right ascending  . Macular degeneration   . Skin cancer 2012    melanoma upper back, removed  . SBO (small bowel obstruction) 07/2013    Past Surgical History  Procedure Laterality Date  . Kidney stone surgery  1972  . Coronary artery bypass graft  2001    4  . Partial colectomy  05/24/2010    Lap converted to open right proximal colectomy  . Left carpal tunnel release  2009  . Carotid endarterectomy  2012    right  . Tonsillectomy  1940  . Prostate biopsy  03/09/14    Gleason 9, vol 98 mL  . Dental surgery    . Ureterolithotomy    . Skin cancer excision  02/2011    upper back  . Cardiac  catheterization  11/1999  . Appendectomy  05/2010    with right hemicolectomy  . Dental extractions Bilateral 1980    History   Social History  . Marital Status: Married    Spouse Name: [name]    Number of Children: 2  . Years of Education: HS   Occupational History  . Retired    Social History Main Topics  . Smoking status: Former Smoker -- 2.00 packs/day for 40 years    Quit date: 11/23/1999  . Smokeless tobacco: Current User    Types: Chew  . Alcohol Use: No  . Drug Use: No  . Sexual Activity:    Partners: Female   Other Topics Concern  . Not on file   Social History Narrative   No regular exercise. Daily caffeine- 3-4 cups daily          ROS: Some hip pain but no fevers or chills, productive cough, hemoptysis, dysphasia, odynophagia, melena, hematochezia, dysuria, hematuria, rash, seizure activity, orthopnea, PND, pedal edema, claudication.  Remaining systems are negative.  Physical Exam: Well-developed well-nourished in no acute distress.  Skin is warm and dry.  HEENT is normal.  Neck is supple.  Chest is clear to auscultation with normal expansion.  Cardiovascular exam is regular rate and rhythm.  Abdominal exam nontender or distended. No masses palpated. Extremities show no edema. neuro grossly intact  ECG Sinus rhythm at a rate of 76. Right bundle branch block. Inferior lateral T-wave inversion.

## 2014-05-13 ENCOUNTER — Encounter: Payer: Self-pay | Admitting: Cardiology

## 2014-05-13 ENCOUNTER — Ambulatory Visit (INDEPENDENT_AMBULATORY_CARE_PROVIDER_SITE_OTHER): Payer: Medicare Other | Admitting: Cardiology

## 2014-05-13 ENCOUNTER — Encounter: Payer: Self-pay | Admitting: *Deleted

## 2014-05-13 VITALS — BP 140/70 | HR 76 | Ht 69.0 in | Wt 197.4 lb

## 2014-05-13 DIAGNOSIS — I739 Peripheral vascular disease, unspecified: Secondary | ICD-10-CM

## 2014-05-13 DIAGNOSIS — I2510 Atherosclerotic heart disease of native coronary artery without angina pectoris: Secondary | ICD-10-CM

## 2014-05-13 DIAGNOSIS — I679 Cerebrovascular disease, unspecified: Secondary | ICD-10-CM

## 2014-05-13 DIAGNOSIS — I10 Essential (primary) hypertension: Secondary | ICD-10-CM

## 2014-05-13 NOTE — Assessment & Plan Note (Signed)
Continue aspirin. Schedule follow-up carotid Dopplers.

## 2014-05-13 NOTE — Assessment & Plan Note (Signed)
Continue diet. Intolerant to statins. 

## 2014-05-13 NOTE — Assessment & Plan Note (Signed)
Continue aspirin. Intolerant to statins. 

## 2014-05-13 NOTE — Patient Instructions (Signed)

## 2014-05-13 NOTE — Assessment & Plan Note (Signed)
Follow-up vascular surgery. 

## 2014-05-13 NOTE — Assessment & Plan Note (Signed)
Blood pressure controlled. Continue present medications. 

## 2014-05-17 ENCOUNTER — Ambulatory Visit (HOSPITAL_COMMUNITY)
Admission: RE | Admit: 2014-05-17 | Discharge: 2014-05-17 | Disposition: A | Discharge status: Home / Self Care | Payer: Medicare Other | Source: Ambulatory Visit | Attending: Cardiology | Admitting: Cardiology

## 2014-05-17 DIAGNOSIS — I2510 Atherosclerotic heart disease of native coronary artery without angina pectoris: Secondary | ICD-10-CM

## 2014-05-17 DIAGNOSIS — I672 Cerebral atherosclerosis: Secondary | ICD-10-CM | POA: Insufficient documentation

## 2014-05-17 DIAGNOSIS — I6529 Occlusion and stenosis of unspecified carotid artery: Secondary | ICD-10-CM

## 2014-05-17 DIAGNOSIS — I679 Cerebrovascular disease, unspecified: Secondary | ICD-10-CM

## 2014-05-17 DIAGNOSIS — I10 Essential (primary) hypertension: Secondary | ICD-10-CM | POA: Diagnosis not present

## 2014-05-17 NOTE — Progress Notes (Signed)
Carotid Duplex Completed. °Brianna L Mazza,RVT °

## 2014-05-23 ENCOUNTER — Other Ambulatory Visit: Payer: Self-pay | Admitting: Family Medicine

## 2014-05-27 ENCOUNTER — Encounter: Payer: Self-pay | Admitting: Family Medicine

## 2014-05-27 ENCOUNTER — Ambulatory Visit (INDEPENDENT_AMBULATORY_CARE_PROVIDER_SITE_OTHER): Payer: Medicare Other | Admitting: Family Medicine

## 2014-05-27 VITALS — BP 167/78 | HR 62 | Wt 198.0 lb

## 2014-05-27 DIAGNOSIS — E1151 Type 2 diabetes mellitus with diabetic peripheral angiopathy without gangrene: Secondary | ICD-10-CM

## 2014-05-27 DIAGNOSIS — I10 Essential (primary) hypertension: Secondary | ICD-10-CM

## 2014-05-27 DIAGNOSIS — E1159 Type 2 diabetes mellitus with other circulatory complications: Secondary | ICD-10-CM

## 2014-05-27 DIAGNOSIS — C61 Malignant neoplasm of prostate: Secondary | ICD-10-CM

## 2014-05-27 DIAGNOSIS — R232 Flushing: Secondary | ICD-10-CM

## 2014-05-27 DIAGNOSIS — N182 Chronic kidney disease, stage 2 (mild): Secondary | ICD-10-CM | POA: Insufficient documentation

## 2014-05-27 LAB — POCT GLYCOSYLATED HEMOGLOBIN (HGB A1C): Hemoglobin A1C: 8.4

## 2014-05-27 LAB — POCT UA - MICROALBUMIN
Creatinine, POC: 200 mg/dL
Microalbumin Ur, POC: 80 mg/L

## 2014-05-27 MED ORDER — GLIPIZIDE 5 MG PO TABS: 5.0000 mg | ORAL_TABLET | Freq: Two times a day (BID) | ORAL | Status: DC

## 2014-05-27 NOTE — Progress Notes (Signed)
   Subjective:    Patient ID: Clayton Harrison, male    DOB: Oct 27, 1930, 80 y.o.   MRN: [id number]  HPI Diabetes - no hypoglycemic events. No wounds or sores that are not healing well. No increased thirst or urination. Checking glucose at home. Taking medications as prescribed without any side effects. He is just on metformin 1000 mg twice a day. He was on glipizide but we stopped in back in Feb bc A1C was 6.2 and was having some lows.  That was around time admitted for acute renal failure.   He is doing the Firmagone injections for his Prostate Cancer.  He is not feeling well today.    HTN- BPs running 140/70s.  No CP or SOB.  Taking medication as Rx.   He went to the prostate clinic.  Dr. Valere Dross is his radiation oncologist. Will start radiation after they "shrink the prostate".  He has felt much more fatigued and has been having hotflashes. They had suggested [facility] but worried about using with his heart hx.  They are trying soymilk.     Review of Systems     Objective:   Physical Exam  Constitutional: He is oriented to person, place, and time. He appears well-developed and well-nourished.  HENT:  Head: Normocephalic and atraumatic.  Cardiovascular: Normal rate, regular rhythm and normal heart sounds.   Pulmonary/Chest: Effort normal and breath sounds normal.  Neurological: He is alert and oriented to person, place, and time.  Skin: Skin is warm and dry.  Psychiatric: He has a normal mood and affect. His behavior is normal.          Assessment & Plan:  DM- A1c is 8.4, previous was 7.4. Not well controlled.Foot exam done today.  Eye exam is scheduled.  Will add back glipizide. Warned can cause lows. He has taken it before and did well on it. Call if sugar not coming down or not doing well on it.  F/u in 3 months.  Urine micro performed today.   HTN- Repeat BP was better but still mildly elevated. Home BPs seem controlled for his age.    Prostate Cancer therapy causing Hot  flashes - drinking soymilk to help. Recommend soy nuts as well.

## 2014-06-16 NOTE — Progress Notes (Addendum)
GU Location of Tumor / Histology: prostate adenocarcinoma  If Prostate Cancer, Gleason Score is ( 4+ 5) and PSA is 2.74 on 05/25/14  01/17/14)  PSA 10.66 11/17/13 PSA 10.32 05/17/13 PSA 8.40  I-PSS 9  Clayton Harrison presented 11/2012 with signs/symptoms of: Elevated PSA, history of prostate induration, mild voiding symptoms, some intermittency  Biopsies of prostate revealed:  03/09/14 Vol 98 mL   Past/Anticipated interventions by urology, if any: PSA surveillance  Past/Anticipated interventions by medical oncology, if any:  seen by Dr Alen Blew in prostate [place] on 04/19/14. No role for chemotherapy at this time.   Weight changes, if any:   Bowel/Bladder complaints, if any: IPSS, Nocturia 2-3  Nausea/Vomiting, if any: no  Pain issues, if any: no  SAFETY ISSUES:  Prior radiation? no  Pacemaker/ICD? no  Possible current pregnancy? na  Is the patient on methotrexate? 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 No evidence of mets on staging studies.    04/25/14 Firmagon 240 mg  injection , next injection of Firmagon scheduled for January  06/01/14 3 gold seed markers placed, Dr Jeffie Pollock

## 2014-06-21 ENCOUNTER — Ambulatory Visit: Payer: Self-pay | Admitting: Radiation Oncology

## 2014-06-21 ENCOUNTER — Ambulatory Visit
Admission: RE | Admit: 2014-06-21 | Discharge: 2014-06-21 | Disposition: A | Discharge status: Home / Self Care | Payer: Medicare Other | Source: Ambulatory Visit | Attending: Radiation Oncology | Admitting: Radiation Oncology

## 2014-06-21 DIAGNOSIS — C61 Malignant neoplasm of prostate: Secondary | ICD-10-CM | POA: Diagnosis not present

## 2014-06-21 DIAGNOSIS — Z510 Encounter for antineoplastic radiation therapy: Secondary | ICD-10-CM | POA: Diagnosis not present

## 2014-06-21 DIAGNOSIS — I2510 Atherosclerotic heart disease of native coronary artery without angina pectoris: Secondary | ICD-10-CM | POA: Insufficient documentation

## 2014-06-21 DIAGNOSIS — N529 Male erectile dysfunction, unspecified: Secondary | ICD-10-CM | POA: Diagnosis not present

## 2014-06-21 DIAGNOSIS — E119 Type 2 diabetes mellitus without complications: Secondary | ICD-10-CM | POA: Insufficient documentation

## 2014-06-21 NOTE — Addendum Note (Signed)
Encounter addended by: Deirdre Evener, RN on: 06/21/2014  4:28 PM<BR>     Documentation filed: Inpatient Document Flowsheet

## 2014-06-21 NOTE — Progress Notes (Signed)
CC: Dr. Irine Seal,  Dr. Laray Anger  Follow-up note:  Diagnosis: Clinical stage T2a high risk adenocarcinoma prostate  History: [name] is a pleasant 80 year old male who is seen today for review and scheduling of his radiation therapy/IMRT in the management of his clinical stage T2a high risk adenocarcinoma prostate. His PSA was 6.2 in June 2008 rising to 8.4 by November 2014, and 10.66 on 01/17/2014. Dr. Jeffie Pollock noted a nodule along the right apex and therefore he underwent ultrasound-guided biopsies on 03/09/2014. He was found to have Gleason 9 (4+5) adenocarcinoma involving 80% of one core from the left mid gland. He had Gleason 8 (4+4) involving 90% of one core from the left lateral base, 80% of one core from the right base, 90% of one core from the left base and 60% of one core from left lateral apex. He also Gleason 7 (4+3) involving 80% of one core from right lateral base and 90% of one core from left lateral mid gland. He had Gleason 7 (3+4) involving 90% of one core from left lateral mid gland. His gland volume was 98 mL. He is doing reasonably well from a GU and GI standpoint. His PSA score today is 9. His PSA score was 4 on 04/13/2014. He has erectile dysfunction. His staging workup included a bone scan and CT scans which were without evidence for metastatic disease. He has multiple medical comorbidities including diabetes and coronary artery disease.  History of internal well but does have bothersome hot flashes.  He also has worsening fatigue.  His I PSS score today is 9.  He was started on monthly Firmagon on November 2.  Gold seed markers were placed on 06/01/2014.  Physical examination: Alert and oriented. Wt Readings from Last 3 Encounters:  05/27/14 198 lb (89.812 kg)  05/13/14 197 lb 6.4 oz (89.54 kg)  04/19/14 195 lb (88.451 kg)   Temp Readings from Last 3 Encounters:  04/19/14 98.1 F (36.7 C) Oral  08/09/13 97.8 F (36.6 C) Oral  07/22/13 97.6 F (36.4 C)    BP  Readings from Last 3 Encounters:  05/27/14 167/78  05/13/14 140/70  04/19/14 151/64   Pulse Readings from Last 3 Encounters:  05/27/14 62  05/13/14 76  04/19/14 63   Rectal exam: The prostate gland is slightly enlarged  and is without focal induration or nodularity.  The previously noted right prostatic nodule is no longer palpable.  Impression: Stage T2a high-risk adenocarcinoma prostate.  We again discussed his management options.  He has decided on external beam/IMRT.  We discussed the potential acute and late toxicities of radiation therapy.  We also discussed duration of androgen deprivation therapy based on his medical comorbidities we may get the most benefit from 6-8 months of androgen deprivation therapy rather than the standard duration of 2 years.  We discussed bladder filling to minimize urinary toxicity.  I will have him return here for CT simulation on January 18.  Consent is signed today.  Plan: As above.  30 minutes was spent face-to-face with the patient, primarily counseling the patient and coordinating his care.

## 2014-06-21 NOTE — Addendum Note (Signed)
Encounter addended by: Deirdre Evener, RN on: 06/21/2014  2:08 PM<BR>     Documentation filed: Charges VN

## 2014-06-28 DIAGNOSIS — C61 Malignant neoplasm of prostate: Secondary | ICD-10-CM | POA: Diagnosis not present

## 2014-07-06 NOTE — Addendum Note (Signed)
Encounter addended by: Deirdre Evener, RN on: 07/06/2014 11:50 AM<BR>     Documentation filed: Notes Section

## 2014-07-11 ENCOUNTER — Ambulatory Visit
Admission: RE | Admit: 2014-07-11 | Discharge: 2014-07-11 | Disposition: A | Discharge status: Home / Self Care | Payer: Medicare Other | Source: Ambulatory Visit | Attending: Radiation Oncology | Admitting: Radiation Oncology

## 2014-07-11 DIAGNOSIS — I2510 Atherosclerotic heart disease of native coronary artery without angina pectoris: Secondary | ICD-10-CM | POA: Diagnosis not present

## 2014-07-11 DIAGNOSIS — Z510 Encounter for antineoplastic radiation therapy: Secondary | ICD-10-CM | POA: Diagnosis not present

## 2014-07-11 DIAGNOSIS — E119 Type 2 diabetes mellitus without complications: Secondary | ICD-10-CM | POA: Diagnosis not present

## 2014-07-11 DIAGNOSIS — C61 Malignant neoplasm of prostate: Secondary | ICD-10-CM | POA: Diagnosis not present

## 2014-07-11 DIAGNOSIS — N529 Male erectile dysfunction, unspecified: Secondary | ICD-10-CM | POA: Diagnosis not present

## 2014-07-11 NOTE — Progress Notes (Signed)
Complex simulation/treatment planning note: the patient was taken to the CT simulator.  He was placed supine.  A Vac lock immobilization device was constructed.  A red rubber tube was placed within the rectal vault.  He was then catheterized and contrast instilled into the bladder/urethra.  He was then scanned.  I chose an isocenter along the central prostate.  Of note is that he did have a large median bar.  The CT data set was sent to the MIM putting system where I contoured his prostate, seminal vesicles, bladder, rectum, and lower rectosigmoid colon.  I am prescribing 7800 cGy in 40 sessions utilizing dual ARC VMAT IMRT with 6 MV photons.  I'm expanding the prostate GTV by 0.8 cm except for 0.5 cm along the rectum to create the prostate PTV.  The seminal vesicle PTV represents the seminal vesicles +0.5 cm.  He will receive 5600 cGy in 40 sessions to his seminal vesicle PTV.  He is now ready for IMRT simulation/treatment planning.

## 2014-07-12 ENCOUNTER — Encounter: Payer: Self-pay | Admitting: Radiation Oncology

## 2014-07-12 DIAGNOSIS — C61 Malignant neoplasm of prostate: Secondary | ICD-10-CM | POA: Diagnosis not present

## 2014-07-12 DIAGNOSIS — Z510 Encounter for antineoplastic radiation therapy: Secondary | ICD-10-CM | POA: Diagnosis not present

## 2014-07-12 DIAGNOSIS — I2510 Atherosclerotic heart disease of native coronary artery without angina pectoris: Secondary | ICD-10-CM | POA: Diagnosis not present

## 2014-07-12 DIAGNOSIS — N529 Male erectile dysfunction, unspecified: Secondary | ICD-10-CM | POA: Diagnosis not present

## 2014-07-12 DIAGNOSIS — E119 Type 2 diabetes mellitus without complications: Secondary | ICD-10-CM | POA: Diagnosis not present

## 2014-07-12 NOTE — Progress Notes (Signed)
IMRT simulation/treatment planning note: The patient completed IMRT simulation/treatment planning in the management of his carcinoma the prostate.  IMRT was chosen to decrease the risk for both acute and late bladder and rectal toxicity compared to conventional radiation therapy or 3-D conformal radiation therapy.  Dose volume histograms were obtained for the target structures including the prostate and seminal vesicles along with avoidance structures including the bladder, rectum, and femoral heads.  We met our departmental guidelines.  I'm prescribing 7800 cGy in 40 sessions to his prostate PTV and 5600 cGy in 40 sessions to his seminal vesicle PTV.

## 2014-07-13 DIAGNOSIS — E119 Type 2 diabetes mellitus without complications: Secondary | ICD-10-CM | POA: Diagnosis not present

## 2014-07-13 DIAGNOSIS — N529 Male erectile dysfunction, unspecified: Secondary | ICD-10-CM | POA: Diagnosis not present

## 2014-07-13 DIAGNOSIS — I2510 Atherosclerotic heart disease of native coronary artery without angina pectoris: Secondary | ICD-10-CM | POA: Diagnosis not present

## 2014-07-13 DIAGNOSIS — C61 Malignant neoplasm of prostate: Secondary | ICD-10-CM | POA: Diagnosis not present

## 2014-07-13 DIAGNOSIS — Z510 Encounter for antineoplastic radiation therapy: Secondary | ICD-10-CM | POA: Diagnosis not present

## 2014-07-20 ENCOUNTER — Ambulatory Visit
Admission: RE | Admit: 2014-07-20 | Discharge: 2014-07-20 | Disposition: A | Discharge status: Home / Self Care | Payer: Medicare Other | Source: Ambulatory Visit | Attending: Radiation Oncology | Admitting: Radiation Oncology

## 2014-07-20 DIAGNOSIS — C61 Malignant neoplasm of prostate: Secondary | ICD-10-CM

## 2014-07-20 DIAGNOSIS — E119 Type 2 diabetes mellitus without complications: Secondary | ICD-10-CM | POA: Diagnosis not present

## 2014-07-20 DIAGNOSIS — Z510 Encounter for antineoplastic radiation therapy: Secondary | ICD-10-CM | POA: Diagnosis not present

## 2014-07-20 DIAGNOSIS — N529 Male erectile dysfunction, unspecified: Secondary | ICD-10-CM | POA: Diagnosis not present

## 2014-07-20 DIAGNOSIS — I2510 Atherosclerotic heart disease of native coronary artery without angina pectoris: Secondary | ICD-10-CM | POA: Diagnosis not present

## 2014-07-20 NOTE — Progress Notes (Signed)
Education today regarding side-effects related to radiation therapy to the pelvis for prostate cancer: fatigue, burning upon urination, rectal discomfort, loose or diarrheal stools, change in urination pattern with potential urgency, slowing/stop-start of stream, and increase night time voiding. Given the Radiation therapy and you booklet with the appropriate pages marked.  Informed that Dr. Valere Dross see's all of his patients every Monday after treatment, but if this changes he will let him know in advance.  Given this RN's business card.  He stated understanding of above.

## 2014-07-20 NOTE — Progress Notes (Signed)
Chart note: The patient began his IMRT today in the management of his carcinoma the prostate.  He is being treated with dual ARC VMAT IMRT utilizing 2 dynamic MLCs corresponding to one set of IMRT treatment devices (51884).

## 2014-07-20 NOTE — Addendum Note (Signed)
Encounter addended by: Deirdre Evener, RN on: 07/20/2014  4:23 PM<BR>     Documentation filed: Inpatient Patient Education

## 2014-07-21 ENCOUNTER — Ambulatory Visit
Admission: RE | Admit: 2014-07-21 | Discharge: 2014-07-21 | Disposition: A | Discharge status: Home / Self Care | Payer: Medicare Other | Source: Ambulatory Visit | Attending: Radiation Oncology | Admitting: Radiation Oncology

## 2014-07-21 DIAGNOSIS — E119 Type 2 diabetes mellitus without complications: Secondary | ICD-10-CM | POA: Diagnosis not present

## 2014-07-21 DIAGNOSIS — I2510 Atherosclerotic heart disease of native coronary artery without angina pectoris: Secondary | ICD-10-CM | POA: Diagnosis not present

## 2014-07-21 DIAGNOSIS — C61 Malignant neoplasm of prostate: Secondary | ICD-10-CM | POA: Diagnosis not present

## 2014-07-21 DIAGNOSIS — Z510 Encounter for antineoplastic radiation therapy: Secondary | ICD-10-CM | POA: Diagnosis not present

## 2014-07-21 DIAGNOSIS — N529 Male erectile dysfunction, unspecified: Secondary | ICD-10-CM | POA: Diagnosis not present

## 2014-07-22 ENCOUNTER — Ambulatory Visit
Admission: RE | Admit: 2014-07-22 | Discharge: 2014-07-22 | Disposition: A | Discharge status: Home / Self Care | Payer: Medicare Other | Source: Ambulatory Visit | Attending: Radiation Oncology | Admitting: Radiation Oncology

## 2014-07-22 DIAGNOSIS — Z510 Encounter for antineoplastic radiation therapy: Secondary | ICD-10-CM | POA: Diagnosis not present

## 2014-07-22 DIAGNOSIS — E119 Type 2 diabetes mellitus without complications: Secondary | ICD-10-CM | POA: Diagnosis not present

## 2014-07-22 DIAGNOSIS — I2510 Atherosclerotic heart disease of native coronary artery without angina pectoris: Secondary | ICD-10-CM | POA: Diagnosis not present

## 2014-07-22 DIAGNOSIS — C61 Malignant neoplasm of prostate: Secondary | ICD-10-CM | POA: Diagnosis not present

## 2014-07-22 DIAGNOSIS — N529 Male erectile dysfunction, unspecified: Secondary | ICD-10-CM | POA: Diagnosis not present

## 2014-07-25 ENCOUNTER — Ambulatory Visit
Admission: RE | Admit: 2014-07-25 | Discharge: 2014-07-25 | Disposition: A | Discharge status: Home / Self Care | Payer: Medicare Other | Source: Ambulatory Visit | Attending: Radiation Oncology | Admitting: Radiation Oncology

## 2014-07-25 ENCOUNTER — Encounter: Payer: Self-pay | Admitting: Radiation Oncology

## 2014-07-25 VITALS — BP 155/61 | HR 67 | Temp 98.1°F | Resp 12 | Wt 203.2 lb

## 2014-07-25 DIAGNOSIS — C61 Malignant neoplasm of prostate: Secondary | ICD-10-CM

## 2014-07-25 DIAGNOSIS — I2510 Atherosclerotic heart disease of native coronary artery without angina pectoris: Secondary | ICD-10-CM | POA: Diagnosis not present

## 2014-07-25 DIAGNOSIS — Z510 Encounter for antineoplastic radiation therapy: Secondary | ICD-10-CM | POA: Diagnosis not present

## 2014-07-25 DIAGNOSIS — N529 Male erectile dysfunction, unspecified: Secondary | ICD-10-CM | POA: Diagnosis not present

## 2014-07-25 DIAGNOSIS — E119 Type 2 diabetes mellitus without complications: Secondary | ICD-10-CM | POA: Diagnosis not present

## 2014-07-25 NOTE — Progress Notes (Signed)
He is currently in no pain.  Pt complains of fatigue and weakness Pt reports urinary frequency and hot flashes, then chills.  Pt states they urinate 2 - 3 times per night.  Pt reports a soft bowel movement everyday.  BP 155/61 mmHg  Pulse 67  Temp(Src) 98.1 F (36.7 C) (Oral)  Resp 12  Wt 203 lb 3.2 oz (92.171 kg)  SpO2 99%

## 2014-07-25 NOTE — Progress Notes (Signed)
Weekly Management Note:  Site: Prostate Current Dose:  780  cGy Projected Dose: 7800  cGy  Narrative: The patient is seen today for routine under treatment assessment. CBCT/MVCT images/port films were reviewed. The chart was reviewed.   Bladder filling is satisfactory.  No new GU or GI difficulties.  Physical Examination:  Filed Vitals:   07/25/14 1031  BP: 155/61  Pulse: 67  Temp: 98.1 F (36.7 C)  Resp: 12  .  Weight: 203 lb 3.2 oz (92.171 kg).  No change.  Impression: Tolerating radiation therapy well.  Plan: Continue radiation therapy as planned.

## 2014-07-26 ENCOUNTER — Ambulatory Visit
Admission: RE | Admit: 2014-07-26 | Discharge: 2014-07-26 | Disposition: A | Discharge status: Home / Self Care | Payer: Medicare Other | Source: Ambulatory Visit | Attending: Radiation Oncology | Admitting: Radiation Oncology

## 2014-07-26 DIAGNOSIS — N529 Male erectile dysfunction, unspecified: Secondary | ICD-10-CM | POA: Diagnosis not present

## 2014-07-26 DIAGNOSIS — E119 Type 2 diabetes mellitus without complications: Secondary | ICD-10-CM | POA: Diagnosis not present

## 2014-07-26 DIAGNOSIS — C61 Malignant neoplasm of prostate: Secondary | ICD-10-CM | POA: Diagnosis not present

## 2014-07-26 DIAGNOSIS — I2510 Atherosclerotic heart disease of native coronary artery without angina pectoris: Secondary | ICD-10-CM | POA: Diagnosis not present

## 2014-07-26 DIAGNOSIS — Z510 Encounter for antineoplastic radiation therapy: Secondary | ICD-10-CM | POA: Diagnosis not present

## 2014-07-27 ENCOUNTER — Ambulatory Visit
Admission: RE | Admit: 2014-07-27 | Discharge: 2014-07-27 | Disposition: A | Discharge status: Home / Self Care | Payer: Medicare Other | Source: Ambulatory Visit | Attending: Radiation Oncology | Admitting: Radiation Oncology

## 2014-07-27 DIAGNOSIS — Z510 Encounter for antineoplastic radiation therapy: Secondary | ICD-10-CM | POA: Diagnosis not present

## 2014-07-27 DIAGNOSIS — E119 Type 2 diabetes mellitus without complications: Secondary | ICD-10-CM | POA: Diagnosis not present

## 2014-07-27 DIAGNOSIS — C61 Malignant neoplasm of prostate: Secondary | ICD-10-CM | POA: Diagnosis not present

## 2014-07-27 DIAGNOSIS — N529 Male erectile dysfunction, unspecified: Secondary | ICD-10-CM | POA: Diagnosis not present

## 2014-07-27 DIAGNOSIS — Z5111 Encounter for antineoplastic chemotherapy: Secondary | ICD-10-CM | POA: Diagnosis not present

## 2014-07-27 DIAGNOSIS — I2510 Atherosclerotic heart disease of native coronary artery without angina pectoris: Secondary | ICD-10-CM | POA: Diagnosis not present

## 2014-07-28 ENCOUNTER — Ambulatory Visit
Admission: RE | Admit: 2014-07-28 | Discharge: 2014-07-28 | Disposition: A | Discharge status: Home / Self Care | Payer: Medicare Other | Source: Ambulatory Visit | Attending: Radiation Oncology | Admitting: Radiation Oncology

## 2014-07-28 DIAGNOSIS — N529 Male erectile dysfunction, unspecified: Secondary | ICD-10-CM | POA: Diagnosis not present

## 2014-07-28 DIAGNOSIS — C61 Malignant neoplasm of prostate: Secondary | ICD-10-CM | POA: Diagnosis not present

## 2014-07-28 DIAGNOSIS — Z510 Encounter for antineoplastic radiation therapy: Secondary | ICD-10-CM | POA: Diagnosis not present

## 2014-07-28 DIAGNOSIS — I2510 Atherosclerotic heart disease of native coronary artery without angina pectoris: Secondary | ICD-10-CM | POA: Diagnosis not present

## 2014-07-28 DIAGNOSIS — E119 Type 2 diabetes mellitus without complications: Secondary | ICD-10-CM | POA: Diagnosis not present

## 2014-07-29 ENCOUNTER — Ambulatory Visit
Admission: RE | Admit: 2014-07-29 | Discharge: 2014-07-29 | Disposition: A | Discharge status: Home / Self Care | Payer: Medicare Other | Source: Ambulatory Visit | Attending: Radiation Oncology | Admitting: Radiation Oncology

## 2014-07-29 DIAGNOSIS — I2510 Atherosclerotic heart disease of native coronary artery without angina pectoris: Secondary | ICD-10-CM | POA: Diagnosis not present

## 2014-07-29 DIAGNOSIS — C61 Malignant neoplasm of prostate: Secondary | ICD-10-CM | POA: Diagnosis not present

## 2014-07-29 DIAGNOSIS — E119 Type 2 diabetes mellitus without complications: Secondary | ICD-10-CM | POA: Diagnosis not present

## 2014-07-29 DIAGNOSIS — N529 Male erectile dysfunction, unspecified: Secondary | ICD-10-CM | POA: Diagnosis not present

## 2014-07-29 DIAGNOSIS — Z510 Encounter for antineoplastic radiation therapy: Secondary | ICD-10-CM | POA: Diagnosis not present

## 2014-08-01 ENCOUNTER — Ambulatory Visit
Admission: RE | Admit: 2014-08-01 | Discharge: 2014-08-01 | Disposition: A | Discharge status: Home / Self Care | Payer: Medicare Other | Source: Ambulatory Visit | Attending: Radiation Oncology | Admitting: Radiation Oncology

## 2014-08-01 ENCOUNTER — Encounter: Payer: Self-pay | Admitting: Radiation Oncology

## 2014-08-01 VITALS — BP 146/65 | HR 70 | Temp 98.6°F | Ht 69.0 in | Wt 200.8 lb

## 2014-08-01 DIAGNOSIS — C61 Malignant neoplasm of prostate: Secondary | ICD-10-CM

## 2014-08-01 DIAGNOSIS — E119 Type 2 diabetes mellitus without complications: Secondary | ICD-10-CM | POA: Diagnosis not present

## 2014-08-01 DIAGNOSIS — Z510 Encounter for antineoplastic radiation therapy: Secondary | ICD-10-CM | POA: Diagnosis not present

## 2014-08-01 DIAGNOSIS — I2510 Atherosclerotic heart disease of native coronary artery without angina pectoris: Secondary | ICD-10-CM | POA: Diagnosis not present

## 2014-08-01 DIAGNOSIS — N529 Male erectile dysfunction, unspecified: Secondary | ICD-10-CM | POA: Diagnosis not present

## 2014-08-01 MED ORDER — TAMSULOSIN HCL 0.4 MG PO CAPS: 0.4000 mg | ORAL_CAPSULE | Freq: Every day | ORAL | Status: DC

## 2014-08-01 NOTE — Progress Notes (Signed)
Weekly Management Note:  Site: Prostate Current Dose:  1755  cGy Projected Dose: 7800  cGy  Narrative: The patient is seen today for routine under treatment assessment. CBCT/MVCT images/port films were reviewed. The chart was reviewed.   Bladder filling is satisfactory.  He is having more urinary frequency and nocturia 3-4.  Not feel that he is having his bladder.  His stream is slow.  Physical Examination:  Filed Vitals:   08/01/14 1217  BP: 146/65  Pulse: 70  Temp: 98.6 F (37 C)  .  Weight: 200 lb 12.8 oz (91.082 kg).  No change.  Impression: Tolerating radiation therapy well, although I think he has worsening obstructive symptoms.  He does have a history of interstitial cystitis.  I think it is worthwhile to give him a trial of tamsulosin.  I cautioned him about lightheadedness/hypotension.  Plan: Continue radiation therapy as planned.

## 2014-08-01 NOTE — Progress Notes (Addendum)
Clayton Harrison reports frequent urination during the day with Nocturia 3-4.  Denies any dysuria.  He reports that he dribbles prior to initiating his urination.  He denies proctitis nor diarrhea.  He currently has a cold.  Firmagon injection last Wed.

## 2014-08-02 ENCOUNTER — Ambulatory Visit
Admission: RE | Admit: 2014-08-02 | Discharge: 2014-08-02 | Disposition: A | Discharge status: Home / Self Care | Payer: Medicare Other | Source: Ambulatory Visit | Attending: Radiation Oncology | Admitting: Radiation Oncology

## 2014-08-02 DIAGNOSIS — C61 Malignant neoplasm of prostate: Secondary | ICD-10-CM | POA: Diagnosis not present

## 2014-08-02 DIAGNOSIS — E119 Type 2 diabetes mellitus without complications: Secondary | ICD-10-CM | POA: Diagnosis not present

## 2014-08-02 DIAGNOSIS — N529 Male erectile dysfunction, unspecified: Secondary | ICD-10-CM | POA: Diagnosis not present

## 2014-08-02 DIAGNOSIS — Z510 Encounter for antineoplastic radiation therapy: Secondary | ICD-10-CM | POA: Diagnosis not present

## 2014-08-02 DIAGNOSIS — I2510 Atherosclerotic heart disease of native coronary artery without angina pectoris: Secondary | ICD-10-CM | POA: Diagnosis not present

## 2014-08-03 ENCOUNTER — Ambulatory Visit
Admission: RE | Admit: 2014-08-03 | Discharge: 2014-08-03 | Disposition: A | Discharge status: Home / Self Care | Payer: Medicare Other | Source: Ambulatory Visit | Attending: Radiation Oncology | Admitting: Radiation Oncology

## 2014-08-03 DIAGNOSIS — C61 Malignant neoplasm of prostate: Secondary | ICD-10-CM | POA: Diagnosis not present

## 2014-08-03 DIAGNOSIS — E119 Type 2 diabetes mellitus without complications: Secondary | ICD-10-CM | POA: Diagnosis not present

## 2014-08-03 DIAGNOSIS — N529 Male erectile dysfunction, unspecified: Secondary | ICD-10-CM | POA: Diagnosis not present

## 2014-08-03 DIAGNOSIS — Z510 Encounter for antineoplastic radiation therapy: Secondary | ICD-10-CM | POA: Diagnosis not present

## 2014-08-03 DIAGNOSIS — I2510 Atherosclerotic heart disease of native coronary artery without angina pectoris: Secondary | ICD-10-CM | POA: Diagnosis not present

## 2014-08-04 ENCOUNTER — Ambulatory Visit
Admission: RE | Admit: 2014-08-04 | Discharge: 2014-08-04 | Disposition: A | Discharge status: Home / Self Care | Payer: Medicare Other | Source: Ambulatory Visit | Attending: Radiation Oncology | Admitting: Radiation Oncology

## 2014-08-04 DIAGNOSIS — I2510 Atherosclerotic heart disease of native coronary artery without angina pectoris: Secondary | ICD-10-CM | POA: Diagnosis not present

## 2014-08-04 DIAGNOSIS — Z510 Encounter for antineoplastic radiation therapy: Secondary | ICD-10-CM | POA: Diagnosis not present

## 2014-08-04 DIAGNOSIS — E119 Type 2 diabetes mellitus without complications: Secondary | ICD-10-CM | POA: Diagnosis not present

## 2014-08-04 DIAGNOSIS — C61 Malignant neoplasm of prostate: Secondary | ICD-10-CM | POA: Diagnosis not present

## 2014-08-04 DIAGNOSIS — N529 Male erectile dysfunction, unspecified: Secondary | ICD-10-CM | POA: Diagnosis not present

## 2014-08-05 ENCOUNTER — Ambulatory Visit (INDEPENDENT_AMBULATORY_CARE_PROVIDER_SITE_OTHER): Payer: Medicare Other | Admitting: Ophthalmology

## 2014-08-05 ENCOUNTER — Ambulatory Visit
Admission: RE | Admit: 2014-08-05 | Discharge: 2014-08-05 | Disposition: A | Discharge status: Home / Self Care | Payer: Medicare Other | Source: Ambulatory Visit | Attending: Radiation Oncology | Admitting: Radiation Oncology

## 2014-08-05 DIAGNOSIS — I2510 Atherosclerotic heart disease of native coronary artery without angina pectoris: Secondary | ICD-10-CM | POA: Diagnosis not present

## 2014-08-05 DIAGNOSIS — N529 Male erectile dysfunction, unspecified: Secondary | ICD-10-CM | POA: Diagnosis not present

## 2014-08-05 DIAGNOSIS — Z510 Encounter for antineoplastic radiation therapy: Secondary | ICD-10-CM | POA: Diagnosis not present

## 2014-08-05 DIAGNOSIS — C61 Malignant neoplasm of prostate: Secondary | ICD-10-CM | POA: Diagnosis not present

## 2014-08-05 DIAGNOSIS — E119 Type 2 diabetes mellitus without complications: Secondary | ICD-10-CM | POA: Diagnosis not present

## 2014-08-08 ENCOUNTER — Ambulatory Visit
Admission: RE | Admit: 2014-08-08 | Discharge: 2014-08-08 | Disposition: A | Discharge status: Home / Self Care | Payer: Medicare Other | Source: Ambulatory Visit | Attending: Radiation Oncology | Admitting: Radiation Oncology

## 2014-08-08 ENCOUNTER — Encounter: Payer: Self-pay | Admitting: Radiation Oncology

## 2014-08-08 VITALS — BP 160/68 | HR 68 | Temp 97.8°F | Resp 12 | Wt 202.4 lb

## 2014-08-08 DIAGNOSIS — C61 Malignant neoplasm of prostate: Secondary | ICD-10-CM | POA: Diagnosis not present

## 2014-08-08 DIAGNOSIS — E119 Type 2 diabetes mellitus without complications: Secondary | ICD-10-CM | POA: Diagnosis not present

## 2014-08-08 DIAGNOSIS — N529 Male erectile dysfunction, unspecified: Secondary | ICD-10-CM | POA: Diagnosis not present

## 2014-08-08 DIAGNOSIS — I2510 Atherosclerotic heart disease of native coronary artery without angina pectoris: Secondary | ICD-10-CM | POA: Diagnosis not present

## 2014-08-08 DIAGNOSIS — Z510 Encounter for antineoplastic radiation therapy: Secondary | ICD-10-CM | POA: Diagnosis not present

## 2014-08-08 NOTE — Progress Notes (Signed)
He is currently in no pain. Pt complains of fatigue, dizziness. Ortho static VS taken, no significant difference.  Pt on Flomax Pt reports urinary frequency, urgency, hot flashes and dribbing. Pt states they urinate 2 - 3 times per night.  Pt reports a soft bowel movement everyday/everyother day. BP 160/68 mmHg  Pulse 68  Temp(Src) 97.8 F (36.6 C) (Oral)  Resp 12  Wt 202 lb 6.4 oz (91.808 kg)  SpO2 100%

## 2014-08-08 NOTE — Progress Notes (Signed)
Weekly Management Note:  Site: Prostate Current Dose:  2730  cGy Projected Dose: 7800  cGy  Narrative: The patient is seen today for routine under treatment assessment. CBCT/MVCT images/port films were reviewed. The chart was reviewed.   Bladder filling is less than ideal today.  No new GU or GI difficulties.  He does have some lightheadedness, very briefly, when he stands.  This may be related to tamsulosin.  He does have nocturia 2-3.  Physical Examination:  Filed Vitals:   08/08/14 1156  BP: 160/68  Pulse: 68  Temp: 97.8 F (36.6 C)  Resp: 12  .  Weight: 202 lb 6.4 oz (91.808 kg).  No change.  Impression: Tolerating radiation therapy well.  Plan: Continue radiation therapy as planned.

## 2014-08-09 ENCOUNTER — Ambulatory Visit
Admission: RE | Admit: 2014-08-09 | Discharge: 2014-08-09 | Disposition: A | Discharge status: Home / Self Care | Payer: Medicare Other | Source: Ambulatory Visit | Attending: Radiation Oncology | Admitting: Radiation Oncology

## 2014-08-09 DIAGNOSIS — N529 Male erectile dysfunction, unspecified: Secondary | ICD-10-CM | POA: Diagnosis not present

## 2014-08-09 DIAGNOSIS — C61 Malignant neoplasm of prostate: Secondary | ICD-10-CM | POA: Diagnosis not present

## 2014-08-09 DIAGNOSIS — E119 Type 2 diabetes mellitus without complications: Secondary | ICD-10-CM | POA: Diagnosis not present

## 2014-08-09 DIAGNOSIS — Z510 Encounter for antineoplastic radiation therapy: Secondary | ICD-10-CM | POA: Diagnosis not present

## 2014-08-09 DIAGNOSIS — I2510 Atherosclerotic heart disease of native coronary artery without angina pectoris: Secondary | ICD-10-CM | POA: Diagnosis not present

## 2014-08-10 ENCOUNTER — Ambulatory Visit
Admission: RE | Admit: 2014-08-10 | Discharge: 2014-08-10 | Disposition: A | Discharge status: Home / Self Care | Payer: Medicare Other | Source: Ambulatory Visit | Attending: Radiation Oncology | Admitting: Radiation Oncology

## 2014-08-10 ENCOUNTER — Ambulatory Visit (INDEPENDENT_AMBULATORY_CARE_PROVIDER_SITE_OTHER): Payer: Medicare Other | Admitting: Ophthalmology

## 2014-08-10 DIAGNOSIS — H35033 Hypertensive retinopathy, bilateral: Secondary | ICD-10-CM | POA: Diagnosis not present

## 2014-08-10 DIAGNOSIS — H3531 Nonexudative age-related macular degeneration: Secondary | ICD-10-CM

## 2014-08-10 DIAGNOSIS — C61 Malignant neoplasm of prostate: Secondary | ICD-10-CM | POA: Diagnosis not present

## 2014-08-10 DIAGNOSIS — H2513 Age-related nuclear cataract, bilateral: Secondary | ICD-10-CM

## 2014-08-10 DIAGNOSIS — E11319 Type 2 diabetes mellitus with unspecified diabetic retinopathy without macular edema: Secondary | ICD-10-CM

## 2014-08-10 DIAGNOSIS — E11329 Type 2 diabetes mellitus with mild nonproliferative diabetic retinopathy without macular edema: Secondary | ICD-10-CM | POA: Diagnosis not present

## 2014-08-10 DIAGNOSIS — I2510 Atherosclerotic heart disease of native coronary artery without angina pectoris: Secondary | ICD-10-CM | POA: Diagnosis not present

## 2014-08-10 DIAGNOSIS — I10 Essential (primary) hypertension: Secondary | ICD-10-CM | POA: Diagnosis not present

## 2014-08-10 DIAGNOSIS — E119 Type 2 diabetes mellitus without complications: Secondary | ICD-10-CM | POA: Diagnosis not present

## 2014-08-10 DIAGNOSIS — N529 Male erectile dysfunction, unspecified: Secondary | ICD-10-CM | POA: Diagnosis not present

## 2014-08-10 DIAGNOSIS — H43813 Vitreous degeneration, bilateral: Secondary | ICD-10-CM

## 2014-08-10 DIAGNOSIS — Z510 Encounter for antineoplastic radiation therapy: Secondary | ICD-10-CM | POA: Diagnosis not present

## 2014-08-11 ENCOUNTER — Ambulatory Visit
Admission: RE | Admit: 2014-08-11 | Discharge: 2014-08-11 | Disposition: A | Discharge status: Home / Self Care | Payer: Medicare Other | Source: Ambulatory Visit | Attending: Radiation Oncology | Admitting: Radiation Oncology

## 2014-08-11 DIAGNOSIS — N529 Male erectile dysfunction, unspecified: Secondary | ICD-10-CM | POA: Diagnosis not present

## 2014-08-11 DIAGNOSIS — C61 Malignant neoplasm of prostate: Secondary | ICD-10-CM | POA: Diagnosis not present

## 2014-08-11 DIAGNOSIS — E119 Type 2 diabetes mellitus without complications: Secondary | ICD-10-CM | POA: Diagnosis not present

## 2014-08-11 DIAGNOSIS — I2510 Atherosclerotic heart disease of native coronary artery without angina pectoris: Secondary | ICD-10-CM | POA: Diagnosis not present

## 2014-08-11 DIAGNOSIS — Z510 Encounter for antineoplastic radiation therapy: Secondary | ICD-10-CM | POA: Diagnosis not present

## 2014-08-12 ENCOUNTER — Ambulatory Visit
Admission: RE | Admit: 2014-08-12 | Discharge: 2014-08-12 | Disposition: A | Discharge status: Home / Self Care | Payer: Medicare Other | Source: Ambulatory Visit | Attending: Radiation Oncology | Admitting: Radiation Oncology

## 2014-08-12 DIAGNOSIS — C61 Malignant neoplasm of prostate: Secondary | ICD-10-CM | POA: Diagnosis not present

## 2014-08-12 DIAGNOSIS — E119 Type 2 diabetes mellitus without complications: Secondary | ICD-10-CM | POA: Diagnosis not present

## 2014-08-12 DIAGNOSIS — I2510 Atherosclerotic heart disease of native coronary artery without angina pectoris: Secondary | ICD-10-CM | POA: Diagnosis not present

## 2014-08-12 DIAGNOSIS — Z510 Encounter for antineoplastic radiation therapy: Secondary | ICD-10-CM | POA: Diagnosis not present

## 2014-08-12 DIAGNOSIS — N529 Male erectile dysfunction, unspecified: Secondary | ICD-10-CM | POA: Diagnosis not present

## 2014-08-15 ENCOUNTER — Ambulatory Visit
Admission: RE | Admit: 2014-08-15 | Discharge: 2014-08-15 | Disposition: A | Discharge status: Home / Self Care | Payer: Medicare Other | Source: Ambulatory Visit | Attending: Radiation Oncology | Admitting: Radiation Oncology

## 2014-08-15 ENCOUNTER — Encounter: Payer: Self-pay | Admitting: Radiation Oncology

## 2014-08-15 VITALS — BP 167/67 | HR 69 | Temp 97.7°F | Ht 69.0 in | Wt 206.0 lb

## 2014-08-15 DIAGNOSIS — N529 Male erectile dysfunction, unspecified: Secondary | ICD-10-CM | POA: Diagnosis not present

## 2014-08-15 DIAGNOSIS — I2510 Atherosclerotic heart disease of native coronary artery without angina pectoris: Secondary | ICD-10-CM | POA: Diagnosis not present

## 2014-08-15 DIAGNOSIS — Z510 Encounter for antineoplastic radiation therapy: Secondary | ICD-10-CM | POA: Diagnosis not present

## 2014-08-15 DIAGNOSIS — C61 Malignant neoplasm of prostate: Secondary | ICD-10-CM | POA: Diagnosis not present

## 2014-08-15 DIAGNOSIS — E119 Type 2 diabetes mellitus without complications: Secondary | ICD-10-CM | POA: Diagnosis not present

## 2014-08-15 NOTE — Progress Notes (Signed)
Clayton Harrison has received 19 fractions to his pelvis for prostate cancer.  He reports voiding 2-3 times nightly which is his norm. Denies any dysuria, or proctitis.  Stares his urination pattern has not changed.  Taking Flomax and notes dizziness whether he takes it at night or during the day.

## 2014-08-15 NOTE — Progress Notes (Signed)
Weekly Management Note:  Site: Prostate  Current Dose:  3705  cGy Projected Dose: 7800  cGy  Narrative: The patient is seen today for routine under treatment assessment. CBCT/MVCT images/port films were reviewed. The chart was reviewed.   Filling is satisfactory.  He still having nocturia 2-3.  I started him on tamsulosin but this caused dizziness and he discontinued it.  Physical Examination:  Filed Vitals:   08/15/14 1149  BP: 167/67  Pulse: 69  Temp: 97.7 F (36.5 C)  .  Weight: 206 lb (93.441 kg).  No change.  Impression: Tolerating radiation therapy well.  Plan: Continue radiation therapy as planned.

## 2014-08-16 ENCOUNTER — Ambulatory Visit
Admission: RE | Admit: 2014-08-16 | Discharge: 2014-08-16 | Disposition: A | Discharge status: Home / Self Care | Payer: Medicare Other | Source: Ambulatory Visit | Attending: Radiation Oncology | Admitting: Radiation Oncology

## 2014-08-16 DIAGNOSIS — C61 Malignant neoplasm of prostate: Secondary | ICD-10-CM | POA: Diagnosis not present

## 2014-08-16 DIAGNOSIS — E119 Type 2 diabetes mellitus without complications: Secondary | ICD-10-CM | POA: Diagnosis not present

## 2014-08-16 DIAGNOSIS — I2510 Atherosclerotic heart disease of native coronary artery without angina pectoris: Secondary | ICD-10-CM | POA: Diagnosis not present

## 2014-08-16 DIAGNOSIS — Z510 Encounter for antineoplastic radiation therapy: Secondary | ICD-10-CM | POA: Diagnosis not present

## 2014-08-16 DIAGNOSIS — N529 Male erectile dysfunction, unspecified: Secondary | ICD-10-CM | POA: Diagnosis not present

## 2014-08-17 ENCOUNTER — Ambulatory Visit
Admission: RE | Admit: 2014-08-17 | Discharge: 2014-08-17 | Disposition: A | Discharge status: Home / Self Care | Payer: Medicare Other | Source: Ambulatory Visit | Attending: Radiation Oncology | Admitting: Radiation Oncology

## 2014-08-17 DIAGNOSIS — N529 Male erectile dysfunction, unspecified: Secondary | ICD-10-CM | POA: Diagnosis not present

## 2014-08-17 DIAGNOSIS — I2510 Atherosclerotic heart disease of native coronary artery without angina pectoris: Secondary | ICD-10-CM | POA: Diagnosis not present

## 2014-08-17 DIAGNOSIS — Z510 Encounter for antineoplastic radiation therapy: Secondary | ICD-10-CM | POA: Diagnosis not present

## 2014-08-17 DIAGNOSIS — E119 Type 2 diabetes mellitus without complications: Secondary | ICD-10-CM | POA: Diagnosis not present

## 2014-08-17 DIAGNOSIS — C61 Malignant neoplasm of prostate: Secondary | ICD-10-CM | POA: Diagnosis not present

## 2014-08-18 ENCOUNTER — Ambulatory Visit
Admission: RE | Admit: 2014-08-18 | Discharge: 2014-08-18 | Disposition: A | Discharge status: Home / Self Care | Payer: Medicare Other | Source: Ambulatory Visit | Attending: Radiation Oncology | Admitting: Radiation Oncology

## 2014-08-18 DIAGNOSIS — N529 Male erectile dysfunction, unspecified: Secondary | ICD-10-CM | POA: Diagnosis not present

## 2014-08-18 DIAGNOSIS — E119 Type 2 diabetes mellitus without complications: Secondary | ICD-10-CM | POA: Diagnosis not present

## 2014-08-18 DIAGNOSIS — Z510 Encounter for antineoplastic radiation therapy: Secondary | ICD-10-CM | POA: Diagnosis not present

## 2014-08-18 DIAGNOSIS — C61 Malignant neoplasm of prostate: Secondary | ICD-10-CM | POA: Diagnosis not present

## 2014-08-18 DIAGNOSIS — I2510 Atherosclerotic heart disease of native coronary artery without angina pectoris: Secondary | ICD-10-CM | POA: Diagnosis not present

## 2014-08-19 ENCOUNTER — Ambulatory Visit
Admission: RE | Admit: 2014-08-19 | Discharge: 2014-08-19 | Disposition: A | Discharge status: Home / Self Care | Payer: Medicare Other | Source: Ambulatory Visit | Attending: Radiation Oncology | Admitting: Radiation Oncology

## 2014-08-19 DIAGNOSIS — N529 Male erectile dysfunction, unspecified: Secondary | ICD-10-CM | POA: Diagnosis not present

## 2014-08-19 DIAGNOSIS — I2510 Atherosclerotic heart disease of native coronary artery without angina pectoris: Secondary | ICD-10-CM | POA: Diagnosis not present

## 2014-08-19 DIAGNOSIS — E119 Type 2 diabetes mellitus without complications: Secondary | ICD-10-CM | POA: Diagnosis not present

## 2014-08-19 DIAGNOSIS — C61 Malignant neoplasm of prostate: Secondary | ICD-10-CM | POA: Diagnosis not present

## 2014-08-19 DIAGNOSIS — Z510 Encounter for antineoplastic radiation therapy: Secondary | ICD-10-CM | POA: Diagnosis not present

## 2014-08-22 ENCOUNTER — Ambulatory Visit
Admission: RE | Admit: 2014-08-22 | Discharge: 2014-08-22 | Disposition: A | Discharge status: Home / Self Care | Payer: Medicare Other | Source: Ambulatory Visit | Attending: Radiation Oncology | Admitting: Radiation Oncology

## 2014-08-22 VITALS — BP 141/64 | HR 69 | Temp 98.0°F | Resp 12 | Wt 205.2 lb

## 2014-08-22 DIAGNOSIS — I2510 Atherosclerotic heart disease of native coronary artery without angina pectoris: Secondary | ICD-10-CM | POA: Diagnosis not present

## 2014-08-22 DIAGNOSIS — Z510 Encounter for antineoplastic radiation therapy: Secondary | ICD-10-CM | POA: Diagnosis not present

## 2014-08-22 DIAGNOSIS — N529 Male erectile dysfunction, unspecified: Secondary | ICD-10-CM | POA: Diagnosis not present

## 2014-08-22 DIAGNOSIS — C61 Malignant neoplasm of prostate: Secondary | ICD-10-CM

## 2014-08-22 DIAGNOSIS — E119 Type 2 diabetes mellitus without complications: Secondary | ICD-10-CM | POA: Diagnosis not present

## 2014-08-22 NOTE — Progress Notes (Signed)
Weekly Management Note:  Site: Prostate Current Dose:  4680  cGy Projected Dose: 7800  cGy  Narrative: The patient is seen today for routine under treatment assessment. CBCT/MVCT images/port films were reviewed. The chart was reviewed.   Bladder filling is satisfactory but less than ideal.  No new GU or GI difficulties.  Again, he could not tolerate tamsulosin because of hypotension.  Physical Examination:  Filed Vitals:   08/22/14 1200  BP: 141/64  Pulse: 69  Temp: 98 F (36.7 C)  Resp: 12  .  Weight: 205 lb 3.2 oz (93.078 kg).  No change.  Impression: Tolerating radiation therapy well.  Plan: Continue radiation therapy as planned.

## 2014-08-22 NOTE — Progress Notes (Signed)
He is currently in no pain.  Pt complains of fatigue Pt reports urinary frequency, retention and hot flashes. Pt states they urinate 2 - 3 times per night. Pt stopped Flomax on 08/15/14 due to continued dizziness.  Pt reports a soft bowel movement everyday/everyother day. BP 141/64 mmHg  Pulse 69  Temp(Src) 98 F (36.7 C) (Oral)  Resp 12  Wt 205 lb 3.2 oz (93.078 kg)  SpO2 99%

## 2014-08-23 ENCOUNTER — Ambulatory Visit
Admission: RE | Admit: 2014-08-23 | Discharge: 2014-08-23 | Disposition: A | Discharge status: Home / Self Care | Payer: Medicare Other | Source: Ambulatory Visit | Attending: Radiation Oncology | Admitting: Radiation Oncology

## 2014-08-23 ENCOUNTER — Ambulatory Visit: Payer: Medicare Other | Admitting: Family Medicine

## 2014-08-23 DIAGNOSIS — E119 Type 2 diabetes mellitus without complications: Secondary | ICD-10-CM | POA: Diagnosis not present

## 2014-08-23 DIAGNOSIS — C61 Malignant neoplasm of prostate: Secondary | ICD-10-CM | POA: Diagnosis not present

## 2014-08-23 DIAGNOSIS — I2510 Atherosclerotic heart disease of native coronary artery without angina pectoris: Secondary | ICD-10-CM | POA: Diagnosis not present

## 2014-08-23 DIAGNOSIS — Z510 Encounter for antineoplastic radiation therapy: Secondary | ICD-10-CM | POA: Diagnosis not present

## 2014-08-23 DIAGNOSIS — N529 Male erectile dysfunction, unspecified: Secondary | ICD-10-CM | POA: Diagnosis not present

## 2014-08-24 ENCOUNTER — Ambulatory Visit
Admission: RE | Admit: 2014-08-24 | Discharge: 2014-08-24 | Disposition: A | Discharge status: Home / Self Care | Payer: Medicare Other | Source: Ambulatory Visit | Attending: Radiation Oncology | Admitting: Radiation Oncology

## 2014-08-24 DIAGNOSIS — C61 Malignant neoplasm of prostate: Secondary | ICD-10-CM | POA: Diagnosis not present

## 2014-08-24 DIAGNOSIS — E119 Type 2 diabetes mellitus without complications: Secondary | ICD-10-CM | POA: Diagnosis not present

## 2014-08-24 DIAGNOSIS — I2510 Atherosclerotic heart disease of native coronary artery without angina pectoris: Secondary | ICD-10-CM | POA: Diagnosis not present

## 2014-08-24 DIAGNOSIS — Z510 Encounter for antineoplastic radiation therapy: Secondary | ICD-10-CM | POA: Diagnosis not present

## 2014-08-24 DIAGNOSIS — N529 Male erectile dysfunction, unspecified: Secondary | ICD-10-CM | POA: Diagnosis not present

## 2014-08-25 ENCOUNTER — Ambulatory Visit
Admission: RE | Admit: 2014-08-25 | Discharge: 2014-08-25 | Disposition: A | Discharge status: Home / Self Care | Payer: Medicare Other | Source: Ambulatory Visit | Attending: Radiation Oncology | Admitting: Radiation Oncology

## 2014-08-25 ENCOUNTER — Ambulatory Visit: Payer: Medicare Other | Admitting: Family Medicine

## 2014-08-25 DIAGNOSIS — N529 Male erectile dysfunction, unspecified: Secondary | ICD-10-CM | POA: Diagnosis not present

## 2014-08-25 DIAGNOSIS — I2510 Atherosclerotic heart disease of native coronary artery without angina pectoris: Secondary | ICD-10-CM | POA: Diagnosis not present

## 2014-08-25 DIAGNOSIS — E119 Type 2 diabetes mellitus without complications: Secondary | ICD-10-CM | POA: Diagnosis not present

## 2014-08-25 DIAGNOSIS — Z510 Encounter for antineoplastic radiation therapy: Secondary | ICD-10-CM | POA: Diagnosis not present

## 2014-08-25 DIAGNOSIS — C61 Malignant neoplasm of prostate: Secondary | ICD-10-CM | POA: Diagnosis not present

## 2014-08-26 ENCOUNTER — Telehealth: Payer: Self-pay | Admitting: Family Medicine

## 2014-08-26 ENCOUNTER — Encounter: Payer: Self-pay | Admitting: Family Medicine

## 2014-08-26 ENCOUNTER — Ambulatory Visit (INDEPENDENT_AMBULATORY_CARE_PROVIDER_SITE_OTHER): Payer: Medicare Other | Admitting: Family Medicine

## 2014-08-26 ENCOUNTER — Ambulatory Visit
Admission: RE | Admit: 2014-08-26 | Discharge: 2014-08-26 | Disposition: A | Discharge status: Home / Self Care | Payer: Medicare Other | Source: Ambulatory Visit | Attending: Radiation Oncology | Admitting: Radiation Oncology

## 2014-08-26 VITALS — BP 112/66 | HR 75 | Wt 204.0 lb

## 2014-08-26 DIAGNOSIS — C61 Malignant neoplasm of prostate: Secondary | ICD-10-CM | POA: Diagnosis not present

## 2014-08-26 DIAGNOSIS — I2510 Atherosclerotic heart disease of native coronary artery without angina pectoris: Secondary | ICD-10-CM | POA: Diagnosis not present

## 2014-08-26 DIAGNOSIS — I10 Essential (primary) hypertension: Secondary | ICD-10-CM

## 2014-08-26 DIAGNOSIS — R232 Flushing: Secondary | ICD-10-CM | POA: Diagnosis not present

## 2014-08-26 DIAGNOSIS — Z510 Encounter for antineoplastic radiation therapy: Secondary | ICD-10-CM | POA: Diagnosis not present

## 2014-08-26 DIAGNOSIS — E1159 Type 2 diabetes mellitus with other circulatory complications: Secondary | ICD-10-CM | POA: Diagnosis not present

## 2014-08-26 DIAGNOSIS — E119 Type 2 diabetes mellitus without complications: Secondary | ICD-10-CM | POA: Diagnosis not present

## 2014-08-26 DIAGNOSIS — E1151 Type 2 diabetes mellitus with diabetic peripheral angiopathy without gangrene: Secondary | ICD-10-CM

## 2014-08-26 DIAGNOSIS — N529 Male erectile dysfunction, unspecified: Secondary | ICD-10-CM | POA: Diagnosis not present

## 2014-08-26 MED ORDER — GLIPIZIDE 10 MG PO TABS: 10.0000 mg | ORAL_TABLET | Freq: Two times a day (BID) | ORAL | Status: DC

## 2014-08-26 NOTE — Progress Notes (Signed)
Subjective:    Patient ID: Clayton Harrison, male    DOB: 1931/01/29, 81 y.o.   MRN: [id number]  HPI  66 yto male with prostate adenocarcinoma undergoing chemo/radiation.   Diabetes - no hypoglycemic events. No wounds or sores that are not healing well. No increased thirst or urination. Checking glucose at home. Taking medications as prescribed without any side effects. Says after he has his shot for chem then his sugars run higher in the 190s for 5-6 days.  In between shots he still has been running in the 150-160 range with his blood sugars. Gets Firmagon injection every 28/30 days.    Eye exam is up to date.  Will need a cataract removed in the future after he is done with his radiation and chem.     he is very concerned about the hot flashes. He says it's really been disruptive to his quality of sleep. He did try Megace for short. A time but it increased his appetite and his weight and he stopped it after about a week and a half. He did have a day a couple days ago where he felt very down and sad and tearful. But overall he feels like he's kept his spirits up and has a lot of family support.  Hypertension- Pt denies chest pain, SOB, dizziness, or heart palpitations.  Taking meds as directed w/o problems.  Denies medication side effects. He reports BP have looked gret thorugh his treatments.      Review of Systems  BP 112/66 mmHg  Pulse 75  Wt 204 lb (92.534 kg)  SpO2 95%    Allergies  Allergen Reactions  . Shellfish Allergy Anaphylaxis    Swelling of the throat  . Atorvastatin     REACTION: urinary frequency  . Ezetimibe     REACTION: "hip problems"  . Flomax [Tamsulosin Hcl]     Complains of dizziness  . Pravastatin Sodium     REACTION: myalgia  . Rosuvastatin   . Statins     Past Medical History  Diagnosis Date  . DM (diabetes mellitus)   . CAD (coronary artery disease)   . Vertigo   . HTN (hypertension)   . Other and unspecified hyperlipidemia   . Cerebrovascular  disease, unspecified   . Elevated PSA   . Interstitial cystitis   . Statin intolerance   . Anemia, blood loss     history of  . PVD (peripheral vascular disease)   . Carotid artery occlusion   . Adenomatous colon polyp     2.5cm excised by right colectomy 2011  . Prostate cancer 03/09/14    Gleason 4+5=9, volume 98 mL  . Cataract   . GERD (gastroesophageal reflux disease)   . Hypercholesteremia   . Hearing loss   . Blockage of coronary artery of heart 2001    x 4  . Colon tumor 03/2010    right ascending  . Macular degeneration   . Skin cancer 2012    melanoma upper back, removed  . SBO (small bowel obstruction) 07/2013    Past Surgical History  Procedure Laterality Date  . Kidney stone surgery  1972  . Coronary artery bypass graft  2001    4  . Partial colectomy  05/24/2010    Lap converted to open right proximal colectomy  . Left carpal tunnel release  2009  . Carotid endarterectomy  2012    right  . Tonsillectomy  1940  . Prostate biopsy  03/09/14  Gleason 9, vol 98 mL  . Dental surgery    . Ureterolithotomy    . Skin cancer excision  02/2011    upper back  . Cardiac catheterization  11/1999  . Appendectomy  05/2010    with right hemicolectomy  . Dental extractions Bilateral 1980    History   Social History  . Marital Status: Married    Spouse Name: [name]  . Number of Children: 2  . Years of Education: HS   Occupational History  . Retired    Social History Main Topics  . Smoking status: Former Smoker -- 2.00 packs/day for 40 years    Quit date: 11/23/1999  . Smokeless tobacco: Current User    Types: Chew  . Alcohol Use: No  . Drug Use: No  . Sexual Activity:    Partners: Female   Other Topics Concern  . Not on file   Social History Narrative   No regular exercise. Daily caffeine- 3-4 cups daily          Family History  Problem Relation Age of Onset  . Diabetes Mother   . Heart disease Mother   . Heart failure Mother   . Hypertension  Mother   . Heart disease Father   . Colon cancer Maternal Uncle     x4  . Cancer Maternal Uncle     colon  . Cancer Daughter     breast  . Cancer Daughter     breast  . Cancer Cousin     colon    Outpatient Encounter Prescriptions as of 08/26/2014  Medication Sig  . acetaminophen (TYLENOL) 500 MG tablet Take 1,000 mg by mouth every 6 (six) hours as needed for mild pain.  Marland Kitchen amLODipine (NORVASC) 10 MG tablet TAKE 1 TABLET BY MOUTH DAILY.  Marland Kitchen aspirin 325 MG tablet Take 325 mg by mouth daily.    . Calcium Carb-Cholecalciferol (CALCIUM PLUS VITAMIN D3) 600-500 MG-UNIT CAPS Take 1 tablet by mouth 2 (two) times daily.  . Degarelix Acetate (FIRMAGON [place]) Inject 80 mg into the skin every 30 (thirty) days.  Marland Kitchen glipiZIDE (GLUCOTROL) 10 MG tablet Take 1 tablet (10 mg total) by mouth 2 (two) times daily before a meal.  . lansoprazole (PREVACID) 15 MG capsule Take 1 capsule (15 mg total) by mouth daily as needed (acid reflux).  Marland Kitchen lisinopril (PRINIVIL,ZESTRIL) 20 MG tablet TAKE 1 TABLET (20 MG TOTAL) BY MOUTH AT BEDTIME.  . meclizine (ANTIVERT) 25 MG tablet Take 0.5 tablets (12.5 mg total) by mouth as needed for dizziness.  . metFORMIN (GLUCOPHAGE) 1000 MG tablet TAKE 1 TABLET BY MOUTH 2 TIMES DAILY WITH A MEAL  . metoprolol (LOPRESSOR) 50 MG tablet TAKE 1 TABLET (50 MG TOTAL) BY MOUTH 2 (TWO) TIMES DAILY.  . Multiple Vitamins-Minerals (PRESERVISION AREDS 2 PO) Take 1 tablet by mouth 2 (two) times daily.   Vladimir Faster Glycol-Propyl Glycol (SYSTANE) 0.4-0.3 % SOLN Apply 2 drops to eye 2 (two) times daily as needed (dry eye).  . [DISCONTINUED] glipiZIDE (GLUCOTROL) 5 MG tablet Take 1 tablet (5 mg total) by mouth 2 (two) times daily before a meal.          Objective:   Physical Exam  Constitutional: He is oriented to person, place, and time. He appears well-developed and well-nourished.  HENT:  Head: Normocephalic and atraumatic.  Cardiovascular: Normal rate, regular rhythm and normal heart sounds.    Pulmonary/Chest: Effort normal and breath sounds normal.  Neurological: He is alert and oriented  to person, place, and time.  Skin: Skin is warm and dry.  Psychiatric: He has a normal mood and affect. His behavior is normal.          Assessment & Plan:  DM-  We do not have point-of-care testing today available for A1c. Will get blood drawn. He reports that his home blood sugars have been quite elevated some going to go ahead and increase his glipizide to 10 mg twice a day. I did warn him to watch out for hypoglycemia and to check his sugars if he feels poorly or notices any shakiness etc. Due for BMP as well.  Hotflashes - will reach out to Dr. Roni Bread about Effexor.   I think this could be very helpful for him. It's primarily been studied in women for hot flashes but I still think it could be a reasonable choice to try for him and see if it's helpful.  Prostate adenocarcinoma - 12 more radiation tx's and will continue [place] Until November.   HTN - well controlled.

## 2014-08-26 NOTE — Telephone Encounter (Signed)
Please call his Urologist office, Dr. Roni Bread.  I would like to start him on Effexor for hotflashes. Just want to make sure won't interfere with any of Dr. Jethro Poling treatments for his prostate cancer.

## 2014-08-27 LAB — HEMOGLOBIN A1C
Hgb A1c MFr Bld: 7.9 % — ABNORMAL HIGH (ref <5.7)
Mean Plasma Glucose: 180 mg/dL — ABNORMAL HIGH (ref <117)

## 2014-08-27 LAB — BASIC METABOLIC PANEL
BUN: 28 mg/dL — ABNORMAL HIGH (ref 6–23)
CO2: 28 mEq/L (ref 19–32)
Calcium: 9.6 mg/dL (ref 8.4–10.5)
Chloride: 96 mEq/L (ref 96–112)
Creat: 1.38 mg/dL — ABNORMAL HIGH (ref 0.50–1.35)
Glucose, Bld: 135 mg/dL — ABNORMAL HIGH (ref 70–99)
Potassium: 4.5 mEq/L (ref 3.5–5.3)
Sodium: 135 mEq/L (ref 135–145)

## 2014-08-29 ENCOUNTER — Ambulatory Visit
Admission: RE | Admit: 2014-08-29 | Discharge: 2014-08-29 | Disposition: A | Discharge status: Home / Self Care | Payer: Medicare Other | Source: Ambulatory Visit | Attending: Radiation Oncology | Admitting: Radiation Oncology

## 2014-08-29 ENCOUNTER — Encounter: Payer: Self-pay | Admitting: Radiation Oncology

## 2014-08-29 VITALS — BP 146/60 | HR 73 | Temp 97.6°F | Resp 12 | Wt 203.8 lb

## 2014-08-29 DIAGNOSIS — I2510 Atherosclerotic heart disease of native coronary artery without angina pectoris: Secondary | ICD-10-CM | POA: Diagnosis not present

## 2014-08-29 DIAGNOSIS — Z510 Encounter for antineoplastic radiation therapy: Secondary | ICD-10-CM | POA: Diagnosis not present

## 2014-08-29 DIAGNOSIS — E119 Type 2 diabetes mellitus without complications: Secondary | ICD-10-CM | POA: Diagnosis not present

## 2014-08-29 DIAGNOSIS — C61 Malignant neoplasm of prostate: Secondary | ICD-10-CM

## 2014-08-29 DIAGNOSIS — N529 Male erectile dysfunction, unspecified: Secondary | ICD-10-CM | POA: Diagnosis not present

## 2014-08-29 NOTE — Progress Notes (Addendum)
He rates his pain as a 2 on a scale of 0-10. Pt complains of fatigue, not as significant as last week. Pt reports urinary frequency and hot flashes.  Reports hot flashes have increased in intensity.  Pt states they urinate 2 - 3 times per night. Has stopped taking Flomax for approx 2 weeks.  Still occasionally has periods of "dizziness" but not like he was.   Pt reports a soft bowel movement everyday/everyother day. BP 146/60 mmHg  Pulse 73  Temp(Src) 97.6 F (36.4 C) (Oral)  Resp 12  Wt 203 lb 12.8 oz (92.443 kg)  SpO2 98%

## 2014-08-29 NOTE — Progress Notes (Signed)
Weekly Management Note:  Site: Prostate Current Dose:  5655  cGy Projected Dose: 7800  cGy  Narrative: The patient is seen today for routine under treatment assessment. CBCT/MVCT images/port films were reviewed. The chart was reviewed.   Bladder filling is satisfactory.  No new GU or GI difficulties.  He is bothered by hot flashes and his primary care physician is looking at starting Effexor.  Physical Examination:  Filed Vitals:   08/29/14 1208  BP: 146/60  Pulse: 73  Temp: 97.6 F (36.4 C)  Resp: 12  .  Weight: 203 lb 12.8 oz (92.443 kg).  No change.  Impression: Tolerating radiation therapy well.  Plan: Continue radiation therapy as planned.

## 2014-08-30 ENCOUNTER — Ambulatory Visit
Admission: RE | Admit: 2014-08-30 | Discharge: 2014-08-30 | Disposition: A | Discharge status: Home / Self Care | Payer: Medicare Other | Source: Ambulatory Visit | Attending: Radiation Oncology | Admitting: Radiation Oncology

## 2014-08-30 DIAGNOSIS — I2510 Atherosclerotic heart disease of native coronary artery without angina pectoris: Secondary | ICD-10-CM | POA: Diagnosis not present

## 2014-08-30 DIAGNOSIS — N529 Male erectile dysfunction, unspecified: Secondary | ICD-10-CM | POA: Diagnosis not present

## 2014-08-30 DIAGNOSIS — E119 Type 2 diabetes mellitus without complications: Secondary | ICD-10-CM | POA: Diagnosis not present

## 2014-08-30 DIAGNOSIS — Z510 Encounter for antineoplastic radiation therapy: Secondary | ICD-10-CM | POA: Diagnosis not present

## 2014-08-30 DIAGNOSIS — C61 Malignant neoplasm of prostate: Secondary | ICD-10-CM | POA: Diagnosis not present

## 2014-08-31 ENCOUNTER — Ambulatory Visit
Admission: RE | Admit: 2014-08-31 | Discharge: 2014-08-31 | Disposition: A | Discharge status: Home / Self Care | Payer: Medicare Other | Source: Ambulatory Visit | Attending: Radiation Oncology | Admitting: Radiation Oncology

## 2014-08-31 DIAGNOSIS — E119 Type 2 diabetes mellitus without complications: Secondary | ICD-10-CM | POA: Diagnosis not present

## 2014-08-31 DIAGNOSIS — Z510 Encounter for antineoplastic radiation therapy: Secondary | ICD-10-CM | POA: Diagnosis not present

## 2014-08-31 DIAGNOSIS — I2510 Atherosclerotic heart disease of native coronary artery without angina pectoris: Secondary | ICD-10-CM | POA: Diagnosis not present

## 2014-08-31 DIAGNOSIS — N529 Male erectile dysfunction, unspecified: Secondary | ICD-10-CM | POA: Diagnosis not present

## 2014-08-31 DIAGNOSIS — C61 Malignant neoplasm of prostate: Secondary | ICD-10-CM | POA: Diagnosis not present

## 2014-08-31 NOTE — Telephone Encounter (Signed)
Called and spoke w/patrice and she will send a message to Dr. Jeffie Pollock about this.Audelia Hives [place]

## 2014-09-01 ENCOUNTER — Ambulatory Visit
Admission: RE | Admit: 2014-09-01 | Discharge: 2014-09-01 | Disposition: A | Discharge status: Home / Self Care | Payer: Medicare Other | Source: Ambulatory Visit | Attending: Radiation Oncology | Admitting: Radiation Oncology

## 2014-09-01 DIAGNOSIS — E119 Type 2 diabetes mellitus without complications: Secondary | ICD-10-CM | POA: Diagnosis not present

## 2014-09-01 DIAGNOSIS — N529 Male erectile dysfunction, unspecified: Secondary | ICD-10-CM | POA: Diagnosis not present

## 2014-09-01 DIAGNOSIS — I2510 Atherosclerotic heart disease of native coronary artery without angina pectoris: Secondary | ICD-10-CM | POA: Diagnosis not present

## 2014-09-01 DIAGNOSIS — Z510 Encounter for antineoplastic radiation therapy: Secondary | ICD-10-CM | POA: Diagnosis not present

## 2014-09-01 DIAGNOSIS — C61 Malignant neoplasm of prostate: Secondary | ICD-10-CM | POA: Diagnosis not present

## 2014-09-01 NOTE — Telephone Encounter (Signed)
Sounds good. Please call patient and see if he still on board with starting the Effexor. Please let him know that Dr. Roni Bread was okay with this as an option.

## 2014-09-01 NOTE — Telephone Encounter (Signed)
Dr Ralene Muskrat office called and he agrees with the start of Effexor.

## 2014-09-01 NOTE — Telephone Encounter (Signed)
Patient advised and would like the Effexor sent to [facility].

## 2014-09-02 ENCOUNTER — Ambulatory Visit
Admission: RE | Admit: 2014-09-02 | Discharge: 2014-09-02 | Disposition: A | Discharge status: Home / Self Care | Payer: Medicare Other | Source: Ambulatory Visit | Attending: Radiation Oncology | Admitting: Radiation Oncology

## 2014-09-02 DIAGNOSIS — Z510 Encounter for antineoplastic radiation therapy: Secondary | ICD-10-CM | POA: Diagnosis not present

## 2014-09-02 DIAGNOSIS — C61 Malignant neoplasm of prostate: Secondary | ICD-10-CM | POA: Diagnosis not present

## 2014-09-02 DIAGNOSIS — N529 Male erectile dysfunction, unspecified: Secondary | ICD-10-CM | POA: Diagnosis not present

## 2014-09-02 DIAGNOSIS — E119 Type 2 diabetes mellitus without complications: Secondary | ICD-10-CM | POA: Diagnosis not present

## 2014-09-02 DIAGNOSIS — I2510 Atherosclerotic heart disease of native coronary artery without angina pectoris: Secondary | ICD-10-CM | POA: Diagnosis not present

## 2014-09-02 MED ORDER — VENLAFAXINE HCL ER 37.5 MG PO CP24: 37.5000 mg | ORAL_CAPSULE | Freq: Every day | ORAL | Status: DC

## 2014-09-02 NOTE — Telephone Encounter (Signed)
rx sent

## 2014-09-05 ENCOUNTER — Encounter: Payer: Self-pay | Admitting: Radiation Oncology

## 2014-09-05 ENCOUNTER — Ambulatory Visit
Admission: RE | Admit: 2014-09-05 | Discharge: 2014-09-05 | Disposition: A | Discharge status: Home / Self Care | Payer: Medicare Other | Source: Ambulatory Visit | Attending: Radiation Oncology | Admitting: Radiation Oncology

## 2014-09-05 DIAGNOSIS — Z510 Encounter for antineoplastic radiation therapy: Secondary | ICD-10-CM | POA: Diagnosis not present

## 2014-09-05 DIAGNOSIS — E119 Type 2 diabetes mellitus without complications: Secondary | ICD-10-CM | POA: Diagnosis not present

## 2014-09-05 DIAGNOSIS — I2510 Atherosclerotic heart disease of native coronary artery without angina pectoris: Secondary | ICD-10-CM | POA: Diagnosis not present

## 2014-09-05 DIAGNOSIS — N529 Male erectile dysfunction, unspecified: Secondary | ICD-10-CM | POA: Diagnosis not present

## 2014-09-05 DIAGNOSIS — C61 Malignant neoplasm of prostate: Secondary | ICD-10-CM | POA: Diagnosis not present

## 2014-09-06 ENCOUNTER — Ambulatory Visit
Admission: RE | Admit: 2014-09-06 | Discharge: 2014-09-06 | Disposition: A | Discharge status: Home / Self Care | Payer: Medicare Other | Source: Ambulatory Visit | Attending: Radiation Oncology | Admitting: Radiation Oncology

## 2014-09-06 DIAGNOSIS — I2510 Atherosclerotic heart disease of native coronary artery without angina pectoris: Secondary | ICD-10-CM | POA: Diagnosis not present

## 2014-09-06 DIAGNOSIS — C61 Malignant neoplasm of prostate: Secondary | ICD-10-CM

## 2014-09-06 DIAGNOSIS — Z510 Encounter for antineoplastic radiation therapy: Secondary | ICD-10-CM | POA: Diagnosis not present

## 2014-09-06 DIAGNOSIS — N529 Male erectile dysfunction, unspecified: Secondary | ICD-10-CM | POA: Diagnosis not present

## 2014-09-06 DIAGNOSIS — E119 Type 2 diabetes mellitus without complications: Secondary | ICD-10-CM | POA: Diagnosis not present

## 2014-09-06 NOTE — Progress Notes (Signed)
[name] has received 35 fractions to his pelvis for prostate cancer.  He reports a stop and start stream and the need strain to void intermittenty. Denies any dysuria, and notes a "slight burn" in the rectal.  No diarrhea nor loose stools. Denies any fatigue.

## 2014-09-06 NOTE — Progress Notes (Signed)
Weekly Management Note:  Site: Prostate Current Dose:  6825  cGy Projected Dose: 7800  cGy  Narrative: The patient is seen today for routine under treatment assessment. CBCT/MVCT images/port films were reviewed. The chart was reviewed.   Bladder filling satisfactory.  No new GU or GI difficulties.  He did not tolerate tamsulosin.  Physical Examination: There were no vitals filed for this visit..  Weight:  .  No change.  Impression: Tolerating radiation therapy well.  Plan: Continue radiation therapy as planned.

## 2014-09-07 ENCOUNTER — Ambulatory Visit
Admission: RE | Admit: 2014-09-07 | Discharge: 2014-09-07 | Disposition: A | Discharge status: Home / Self Care | Payer: Medicare Other | Source: Ambulatory Visit | Attending: Radiation Oncology | Admitting: Radiation Oncology

## 2014-09-07 DIAGNOSIS — N529 Male erectile dysfunction, unspecified: Secondary | ICD-10-CM | POA: Diagnosis not present

## 2014-09-07 DIAGNOSIS — E119 Type 2 diabetes mellitus without complications: Secondary | ICD-10-CM | POA: Diagnosis not present

## 2014-09-07 DIAGNOSIS — C61 Malignant neoplasm of prostate: Secondary | ICD-10-CM | POA: Diagnosis not present

## 2014-09-07 DIAGNOSIS — I2510 Atherosclerotic heart disease of native coronary artery without angina pectoris: Secondary | ICD-10-CM | POA: Diagnosis not present

## 2014-09-07 DIAGNOSIS — Z510 Encounter for antineoplastic radiation therapy: Secondary | ICD-10-CM | POA: Diagnosis not present

## 2014-09-08 ENCOUNTER — Other Ambulatory Visit: Payer: Self-pay | Admitting: Family Medicine

## 2014-09-08 ENCOUNTER — Ambulatory Visit
Admission: RE | Admit: 2014-09-08 | Discharge: 2014-09-08 | Disposition: A | Discharge status: Home / Self Care | Payer: Medicare Other | Source: Ambulatory Visit | Attending: Radiation Oncology | Admitting: Radiation Oncology

## 2014-09-08 DIAGNOSIS — E119 Type 2 diabetes mellitus without complications: Secondary | ICD-10-CM | POA: Diagnosis not present

## 2014-09-08 DIAGNOSIS — N529 Male erectile dysfunction, unspecified: Secondary | ICD-10-CM | POA: Diagnosis not present

## 2014-09-08 DIAGNOSIS — I2510 Atherosclerotic heart disease of native coronary artery without angina pectoris: Secondary | ICD-10-CM | POA: Diagnosis not present

## 2014-09-08 DIAGNOSIS — C61 Malignant neoplasm of prostate: Secondary | ICD-10-CM | POA: Diagnosis not present

## 2014-09-08 DIAGNOSIS — Z510 Encounter for antineoplastic radiation therapy: Secondary | ICD-10-CM | POA: Diagnosis not present

## 2014-09-09 ENCOUNTER — Ambulatory Visit
Admission: RE | Admit: 2014-09-09 | Discharge: 2014-09-09 | Disposition: A | Discharge status: Home / Self Care | Payer: Medicare Other | Source: Ambulatory Visit | Attending: Radiation Oncology | Admitting: Radiation Oncology

## 2014-09-09 DIAGNOSIS — C61 Malignant neoplasm of prostate: Secondary | ICD-10-CM | POA: Diagnosis not present

## 2014-09-09 DIAGNOSIS — N529 Male erectile dysfunction, unspecified: Secondary | ICD-10-CM | POA: Diagnosis not present

## 2014-09-09 DIAGNOSIS — Z510 Encounter for antineoplastic radiation therapy: Secondary | ICD-10-CM | POA: Diagnosis not present

## 2014-09-09 DIAGNOSIS — E119 Type 2 diabetes mellitus without complications: Secondary | ICD-10-CM | POA: Diagnosis not present

## 2014-09-09 DIAGNOSIS — I2510 Atherosclerotic heart disease of native coronary artery without angina pectoris: Secondary | ICD-10-CM | POA: Diagnosis not present

## 2014-09-10 ENCOUNTER — Other Ambulatory Visit: Payer: Self-pay | Admitting: Family Medicine

## 2014-09-12 ENCOUNTER — Ambulatory Visit
Admission: RE | Admit: 2014-09-12 | Discharge: 2014-09-12 | Disposition: A | Discharge status: Home / Self Care | Payer: Medicare Other | Source: Ambulatory Visit | Attending: Radiation Oncology | Admitting: Radiation Oncology

## 2014-09-12 ENCOUNTER — Encounter: Payer: Self-pay | Admitting: Radiation Oncology

## 2014-09-12 VITALS — BP 132/68 | HR 70 | Temp 97.8°F | Resp 12 | Wt 204.0 lb

## 2014-09-12 DIAGNOSIS — C61 Malignant neoplasm of prostate: Secondary | ICD-10-CM | POA: Diagnosis not present

## 2014-09-12 DIAGNOSIS — E119 Type 2 diabetes mellitus without complications: Secondary | ICD-10-CM | POA: Diagnosis not present

## 2014-09-12 DIAGNOSIS — I2510 Atherosclerotic heart disease of native coronary artery without angina pectoris: Secondary | ICD-10-CM | POA: Diagnosis not present

## 2014-09-12 DIAGNOSIS — Z510 Encounter for antineoplastic radiation therapy: Secondary | ICD-10-CM | POA: Diagnosis not present

## 2014-09-12 DIAGNOSIS — N529 Male erectile dysfunction, unspecified: Secondary | ICD-10-CM | POA: Diagnosis not present

## 2014-09-12 NOTE — Progress Notes (Signed)
Weekly Management Note:  Site: Prostate Current Dose:  7605  cGy Projected Dose: 7800  cGy  Narrative: The patient is seen today for routine under treatment assessment. CBCT/MVCT images/port films were reviewed. The chart was reviewed.   Bladder filling is satisfactory.  No new GU or GI difficulties.  He'll finish his radiation therapy tomorrow.  Physical Examination:  Filed Vitals:   09/12/14 1152  BP: 132/68  Pulse: 70  Temp: 97.8 F (36.6 C)  Resp: 12  .  Weight: 204 lb (92.534 kg).  No change.  Impression: Tolerating radiation therapy well.   Plan: Continue radiation therapy as planned.  One-month follow-up visit after completion of radiation therapy tomorrow.

## 2014-09-12 NOTE — Progress Notes (Signed)
He is currently in no pain.  Pt complains of fatigue and loss of sleep Pt reports urinary frequency, urgency, retention, hesistency and hot flashes. Pt states they urinate 3 - 4 times per night.  Pt reports Diarrhea a few times a day. Denies rectal bleeding.  Reports rectal tenderness.   BP 132/68 mmHg  Pulse 70  Temp(Src) 97.8 F (36.6 C) (Oral)  Resp 12  Wt 204 lb (92.534 kg)  SpO2 99%

## 2014-09-13 ENCOUNTER — Encounter: Payer: Self-pay | Admitting: Radiation Oncology

## 2014-09-13 ENCOUNTER — Ambulatory Visit
Admission: RE | Admit: 2014-09-13 | Discharge: 2014-09-13 | Disposition: A | Discharge status: Home / Self Care | Payer: Medicare Other | Source: Ambulatory Visit | Attending: Radiation Oncology | Admitting: Radiation Oncology

## 2014-09-13 DIAGNOSIS — C61 Malignant neoplasm of prostate: Secondary | ICD-10-CM | POA: Diagnosis not present

## 2014-09-13 DIAGNOSIS — Z510 Encounter for antineoplastic radiation therapy: Secondary | ICD-10-CM | POA: Diagnosis not present

## 2014-09-13 DIAGNOSIS — E119 Type 2 diabetes mellitus without complications: Secondary | ICD-10-CM | POA: Diagnosis not present

## 2014-09-13 DIAGNOSIS — I2510 Atherosclerotic heart disease of native coronary artery without angina pectoris: Secondary | ICD-10-CM | POA: Diagnosis not present

## 2014-09-13 DIAGNOSIS — N529 Male erectile dysfunction, unspecified: Secondary | ICD-10-CM | POA: Diagnosis not present

## 2014-09-13 NOTE — Progress Notes (Signed)
[facility] End of Treatment Note  Name:Clayton Harrison  Date: 09/13/2014 WVP:[id number] DOB:March 13, 1931   Status:outpatient    CC: [name], MD  Dr. Irine Seal   REFERRING PHYSICIAN:  Dr. Irine Seal   DIAGNOSIS:  Stage T2a  high-risk adenocarcinoma prostate  INDICATION FOR TREATMENT: Curative   TREATMENT DATES: 07/20/2014 through 09/13/2014                          SITE/DOSE:  Prostate 7800 cGy in 40 sessions, seminal vesicles 5600 cGy in 40 sessions                          BEAMS/ENERGY:   Dual ARC VMAT IMRT with 6 MV photons                NARRATIVE:   The patient tolerated his treatment reasonably well, however he developed worsening obstructive symptoms for which I gave him a trial tamsulosin.  This made him too dizzy  and this was immediately discontinued.  As noted previously, we felt that he should continue with androgen deprivation therapy for approximately 6-8 months instead of 2 years because of his multiple medical comorbidities.                       PLAN: Routine followup in one month. Patient instructed to call if questions or worsening complaints in interim.

## 2014-09-24 ENCOUNTER — Other Ambulatory Visit: Payer: Self-pay | Admitting: Family Medicine

## 2014-09-27 DIAGNOSIS — C61 Malignant neoplasm of prostate: Secondary | ICD-10-CM | POA: Diagnosis not present

## 2014-10-10 DIAGNOSIS — Z85820 Personal history of malignant melanoma of skin: Secondary | ICD-10-CM | POA: Diagnosis not present

## 2014-10-10 DIAGNOSIS — Z08 Encounter for follow-up examination after completed treatment for malignant neoplasm: Secondary | ICD-10-CM | POA: Diagnosis not present

## 2014-10-10 DIAGNOSIS — L814 Other melanin hyperpigmentation: Secondary | ICD-10-CM | POA: Diagnosis not present

## 2014-10-10 DIAGNOSIS — D225 Melanocytic nevi of trunk: Secondary | ICD-10-CM | POA: Diagnosis not present

## 2014-10-11 ENCOUNTER — Encounter: Payer: Self-pay | Admitting: Radiation Oncology

## 2014-10-12 ENCOUNTER — Encounter: Payer: Self-pay | Admitting: Radiation Oncology

## 2014-10-12 ENCOUNTER — Ambulatory Visit
Admission: RE | Admit: 2014-10-12 | Discharge: 2014-10-12 | Disposition: A | Discharge status: Home / Self Care | Payer: Medicare Other | Source: Ambulatory Visit | Attending: Radiation Oncology | Admitting: Radiation Oncology

## 2014-10-12 VITALS — BP 145/80 | HR 61 | Temp 97.5°F | Resp 20 | Ht 69.0 in | Wt 205.0 lb

## 2014-10-12 DIAGNOSIS — C61 Malignant neoplasm of prostate: Secondary | ICD-10-CM

## 2014-10-12 HISTORY — DX: Personal history of irradiation: Z92.3

## 2014-10-12 NOTE — Progress Notes (Signed)
Clayton Harrison has no voiced concerns regarding urination, nor any rectal irritation

## 2014-10-12 NOTE — Progress Notes (Signed)
CC: Dr. Irine Seal  Follow-up note:  Mr. Sawin visits today approximately 1 month following completion of radiation therapy/IMRT along with androgen deprivation therapy in the management of his Stage T2a high-risk adenocarcinoma prostate.  He has been continuing with his monthly Firmagon injections through Dr. Jeffie Pollock.  He is without GU or GI difficulties. He does report hot flashes and some fatigue, but his fatigue is improved.  Physical examination: Alert and oriented. Filed Vitals:   10/12/14 1030  BP: 145/80  Pulse: 61  Temp: 97.5 F (36.4 C)  Resp: 20   Rectal examination not performed today.  Impression: Satisfactory progress with no residual GU or GI difficulties.  We discussed the ideal duration for androgen deprivation therapy with Mills Koller, and I told the family that we just do not have data to tell us how long he should remain on androgen deprivation therapy.  The best study was done decades ago and 2 years has been the standard of care.  Considering his age and diabetes I feel that one year would be satisfactory.  Plan: As above.  I've not scheduled Mr. Reczek for a formal follow-up visit and I ask that Dr. Jeffie Pollock keep me posted on his progress.

## 2014-10-27 ENCOUNTER — Telehealth: Payer: Self-pay | Admitting: *Deleted

## 2014-10-27 DIAGNOSIS — C61 Malignant neoplasm of prostate: Secondary | ICD-10-CM | POA: Diagnosis not present

## 2014-10-27 DIAGNOSIS — R42 Dizziness and giddiness: Secondary | ICD-10-CM | POA: Diagnosis not present

## 2014-10-27 MED ORDER — PREDNISONE 50 MG PO TABS: ORAL_TABLET | ORAL | Status: DC

## 2014-10-27 NOTE — Telephone Encounter (Signed)
Pt's wife called and stated that he has been in pain from his lower back and it is now affecting his legs x2wks. She stated that he has been using OTC pain patches, arthritis strength tylenol and heating pad for relief and wanted to know if there is something that can be sent in for him to take for the pain. Will fwd to Dr. Darene Lamer for advice. She also informed me that as of today he has stopped taking the Effexor she said that Dr. Roni Bread advised him to d/c.Clayton KitchenAudelia Hives [place]

## 2014-10-27 NOTE — Telephone Encounter (Signed)
I'm guessing his back pain is discogenic?  I will add 5 days of prednisone, however considering history of prostate cancer I do need to see him, and we should probably get advanced imaging. Prostate cancer can metastasize to the lumbar spine.

## 2014-10-28 NOTE — Telephone Encounter (Signed)
lvm informing pt that Dr. Dianah Field has sent in Rx and that he will probably need some imaging done. Advised to rtn call if there are any questions.Audelia Hives [place]

## 2014-11-01 ENCOUNTER — Telehealth: Payer: Self-pay | Admitting: *Deleted

## 2014-11-01 NOTE — Telephone Encounter (Signed)
Pt's wife called and said that the prednisone has caused insomnia and he is still hurting. She informed me that she has made an appt for Thursday at 10.Clayton Harrison

## 2014-11-03 ENCOUNTER — Encounter: Payer: Self-pay | Admitting: Family Medicine

## 2014-11-03 ENCOUNTER — Ambulatory Visit (INDEPENDENT_AMBULATORY_CARE_PROVIDER_SITE_OTHER): Payer: Medicare Other | Admitting: Family Medicine

## 2014-11-03 ENCOUNTER — Ambulatory Visit (INDEPENDENT_AMBULATORY_CARE_PROVIDER_SITE_OTHER): Payer: Medicare Other

## 2014-11-03 VITALS — BP 135/67 | HR 67 | Ht 69.0 in | Wt 205.0 lb

## 2014-11-03 DIAGNOSIS — S3992XA Unspecified injury of lower back, initial encounter: Secondary | ICD-10-CM | POA: Diagnosis not present

## 2014-11-03 DIAGNOSIS — M5442 Lumbago with sciatica, left side: Secondary | ICD-10-CM

## 2014-11-03 DIAGNOSIS — M533 Sacrococcygeal disorders, not elsewhere classified: Secondary | ICD-10-CM | POA: Diagnosis not present

## 2014-11-03 DIAGNOSIS — M545 Low back pain: Secondary | ICD-10-CM

## 2014-11-03 MED ORDER — TIZANIDINE HCL 4 MG PO TABS: 4.0000 mg | ORAL_TABLET | Freq: Three times a day (TID) | ORAL | Status: DC | PRN

## 2014-11-03 MED ORDER — KETOROLAC TROMETHAMINE 60 MG/2ML IM SOLN
60.0000 mg | Freq: Once | INTRAMUSCULAR | Status: AC
  Administered 2014-11-03: 60 mg via INTRAMUSCULAR

## 2014-11-03 NOTE — Progress Notes (Signed)
   Subjective:    Patient ID: Clayton Harrison, male    DOB: Apr 16, 1931, 81 y.o.   MRN: [id number]  HPI 81 year old male with a history of prostate adenocarcinoma comes in today complaining of back pain that started 3 weeks ago when he was lifting a battery into his boat. He specifically complains of pain over the low back near the tailbone and is radiating into the left leg. He reports prior history of fracture to the tailbone as a teenager. Position aggravates his symptoms. It is tender to touch at times.  Took some tylenol.  Using the salon pas and helps some.  Pianful  After walking about 80 feet.  Rates his pain as 8 out of 10 today.   Review of Systems     Objective:   Physical Exam  Constitutional: He is oriented to person, place, and time. He appears well-developed and well-nourished.  HENT:  Head: Normocephalic.  Musculoskeletal:  Tender over the lower lumbar spine and upper sacrum. Just above an old surgical scar. He has some tenderness just to the right of the paraspinous muscles. A little less tender to the left. Tender over the left SI joint. Negative straight leg raise bilaterally. Hip, knee, ankle strength is 5 out of 5 bilaterally. That he had a fair on a spasm and had difficulty straightening after standing. Normal flexion  Neurological: He is alert and oriented to person, place, and time.  Skin: Skin is warm and dry.  Psychiatric: He has a normal mood and affect. His behavior is normal.          Assessment & Plan:  Acute low back pain with radiation into left buttock in an 81 year old after lifting heavy object. Recommend x-rays to rule out compression fracture since he is in significant pain today. Wide muscle relaxer his regimen. Encouraged him to not sit or lie for too long. Try to change position and be active but without any heavy lifting etc. since he clearly has some stiffness on exam. If x-rays are negative then likely a disc issue. He has had low back pain on and  off for years mostly related to a herniated disc. Is getting some pain radiating into the left but not in the distribution of the sciatic nerve. Given Toradol injection for acute pain.

## 2014-11-06 ENCOUNTER — Encounter: Payer: Self-pay | Admitting: Family Medicine

## 2014-11-24 ENCOUNTER — Ambulatory Visit (INDEPENDENT_AMBULATORY_CARE_PROVIDER_SITE_OTHER): Payer: Medicare Other | Admitting: Family Medicine

## 2014-11-24 ENCOUNTER — Encounter: Payer: Self-pay | Admitting: Family Medicine

## 2014-11-24 VITALS — BP 117/61 | HR 66 | Ht 69.0 in | Wt 212.0 lb

## 2014-11-24 DIAGNOSIS — M5442 Lumbago with sciatica, left side: Secondary | ICD-10-CM | POA: Diagnosis not present

## 2014-11-24 DIAGNOSIS — T50905A Adverse effect of unspecified drugs, medicaments and biological substances, initial encounter: Secondary | ICD-10-CM

## 2014-11-24 DIAGNOSIS — Z23 Encounter for immunization: Secondary | ICD-10-CM | POA: Diagnosis not present

## 2014-11-24 NOTE — Progress Notes (Signed)
   Subjective:    Patient ID: Clayton Harrison, male    DOB: 1930-09-28, 81 y.o.   MRN: [id number]  HPI Low back pain was getting better but then mowed the lawn and it started getting worse. Still getting some radiation into the left but not. Worse with prolonged sitting.  A few times it has actually radiated into the posterior thigh.. Taking Advil for pain relief.  It does provide some short-term relief for a few hours but then since to wear off.   Review of Systems     Objective:   Physical Exam  Constitutional: He appears well-developed and well-nourished.  Musculoskeletal:  Normal lumbar flexion and extension. Symmetric rotation right and left that he did have pain with rotation to the right. Nontender of the lumbar spine. He is tender over the left lumbar paraspinous muscles. Nontender over the SI joints. Negative straight leg raise bilaterally. Hip, knee, ankle strength is 5 out of 5 bilaterally.  Skin: Skin is warm and dry.  Psychiatric: He has a normal mood and affect. His behavior is normal.          Assessment & Plan:  Low Back Strain - we discussed possible referral to physical therapy. His x-ray was normal except for some arthritis. Based on his symptoms I suspect a mild herniated disc. He would prefer to do some stretches and exercises on his own at home. wore on core strengthening.  Given H.O on stretches to do on his on.  Can switch to Aleve BID. The medication does not have to be dosed as frequently and has a lower cardiac risk. If he is not improving over the next 3 weeks then please give [place] a call back.  Medication reaction-prednisone and tizanidine added to his intolerance list. It caused insomnia and nightmares.  Prevnar 13 today.

## 2014-11-28 ENCOUNTER — Ambulatory Visit: Payer: Medicare Other | Admitting: Family Medicine

## 2014-11-30 DIAGNOSIS — Z5111 Encounter for antineoplastic chemotherapy: Secondary | ICD-10-CM | POA: Diagnosis not present

## 2014-11-30 DIAGNOSIS — C61 Malignant neoplasm of prostate: Secondary | ICD-10-CM | POA: Diagnosis not present

## 2014-12-01 ENCOUNTER — Other Ambulatory Visit: Payer: Self-pay | Admitting: Family Medicine

## 2014-12-05 ENCOUNTER — Other Ambulatory Visit: Payer: Self-pay | Admitting: Family Medicine

## 2014-12-06 ENCOUNTER — Encounter: Payer: Self-pay | Admitting: Family Medicine

## 2014-12-06 ENCOUNTER — Ambulatory Visit (INDEPENDENT_AMBULATORY_CARE_PROVIDER_SITE_OTHER): Payer: Medicare Other | Admitting: Family Medicine

## 2014-12-06 VITALS — BP 119/68 | HR 72 | Ht 69.0 in | Wt 209.0 lb

## 2014-12-06 DIAGNOSIS — E1151 Type 2 diabetes mellitus with diabetic peripheral angiopathy without gangrene: Secondary | ICD-10-CM

## 2014-12-06 DIAGNOSIS — R42 Dizziness and giddiness: Secondary | ICD-10-CM

## 2014-12-06 DIAGNOSIS — E1159 Type 2 diabetes mellitus with other circulatory complications: Secondary | ICD-10-CM | POA: Diagnosis not present

## 2014-12-06 LAB — POCT GLYCOSYLATED HEMOGLOBIN (HGB A1C): Hemoglobin A1C: 8.4

## 2014-12-06 MED ORDER — INSULIN PEN NEEDLE 32G X 4 MM MISC: Status: DC

## 2014-12-06 MED ORDER — INSULIN GLARGINE 300 UNIT/ML ~~LOC~~ SOPN: 12.0000 [IU] | PEN_INJECTOR | Freq: Every day | SUBCUTANEOUS | Status: DC

## 2014-12-06 NOTE — Progress Notes (Signed)
   Subjective:    Patient ID: Clayton Harrison, male    DOB: [birth year], 81 y.o.   MRN: [id number]  HPI Diabetes - no hypoglycemic events. No wounds or sores that are not healing well. No increased thirst or urination. Checking glucose at home. Taking medications as prescribed without any side effects. On fermagon injections and these have increasing his blood sugar. He i son glipizide, metformin. Has been eating a bowl of ice cream at night. Sugar is around 300.   Having dizzy spells intermittantly throughout the day. He thinks it started around last weekend. It happened while he was at church. No known trigger per se but does think getting up to quickly can trigger it. Gets lightheaded like might pass out.  No room spinning. Says if sits down will feel better with a few minutes.Buddy Duty on Sunday.  Did bring in a log of some blood pressures at home. Most of them were actually slightly elevated in the 140-160 range. And pulses were within the normal range as well. When this happened last Sunday while at church the systolic blood pressure was 133 and pulse was normal. He did not have an opportunity to check his blood sugar when this happened. But he says he has not had any hypoglycemic events and a very long time.    Review of Systems No CP, SOB    Objective:   Physical Exam  Constitutional: He is oriented to person, place, and time. He appears well-developed and well-nourished.  HENT:  Head: Normocephalic and atraumatic.  Cardiovascular: Normal rate, regular rhythm and normal heart sounds.   Pulmonary/Chest: Effort normal and breath sounds normal.  Neurological: He is alert and oriented to person, place, and time.  Skin: Skin is warm and dry.  Psychiatric: He has a normal mood and affect. His behavior is normal.          Assessment & Plan:  DM- uncontrolled. A1C is 8.4 today. ON ACE.  Will start Toujeo at 8 units at bedtime.  Follow-up in 6 weeks. Sample pen provided and patient was able  to administer his first injection himself here in the office. He did great with it. Will send over perception to the pharmacy. We can find out what drug is best covered under his planned. If this works well we might even be able to stick with the insulin and maybe even discontinue the glipizide but keep the metformin..  Dizziness-unclear etiology but blood pressure is definitely low today. Make sure hydrating well and I'm going to go ahead and have him cut his lisinopril in half for at least the next 2 weeks and monitor blood pressures at home. The blood pressures we are getting here a numbers he is getting at home really don't match. But we'll follow carefully. Also encouraged him to make sure that he is hydrating well. This certainly still could be a side effect of the [place]. And his wife did specifically request a handout with a list of potential side effects of the drug. I provided this from up-to-date.

## 2014-12-06 NOTE — Patient Instructions (Addendum)
After 5 day of using the toujeo then check morning sugar. If the sugar is greater than 130 then go up by one unit.  Can increase by one unit every 5 days as needed. Once sugar is under 130 then stay at that dose.

## 2014-12-07 ENCOUNTER — Encounter: Payer: Self-pay | Admitting: Family Medicine

## 2014-12-12 ENCOUNTER — Other Ambulatory Visit: Payer: Self-pay | Admitting: Family Medicine

## 2014-12-14 NOTE — Telephone Encounter (Signed)
I see in the historical med list that the course was completed for this med but I don't see any notes on it.  Is the refill appropriate? Please advise.

## 2014-12-15 ENCOUNTER — Other Ambulatory Visit: Payer: Self-pay | Admitting: *Deleted

## 2014-12-29 ENCOUNTER — Telehealth: Payer: Self-pay | Admitting: *Deleted

## 2014-12-29 DIAGNOSIS — M5430 Sciatica, unspecified side: Secondary | ICD-10-CM

## 2014-12-29 NOTE — Telephone Encounter (Signed)
Pt's daughter called and stated that he is having continued back pain. They have called his Oncology dr and was told that he should contact his PCP regarding this. They told him that he will need Dr. Madilyn Fireman to place an order for an MRI of his back. I placed the order. She asked that this gets done @ [facility] sometime next wk in the afternoon.Maryruth Eve, [place] Crocker

## 2014-12-30 ENCOUNTER — Ambulatory Visit (HOSPITAL_COMMUNITY)
Admission: RE | Admit: 2014-12-30 | Discharge: 2014-12-30 | Disposition: A | Discharge status: Home / Self Care | Payer: Medicare Other | Source: Ambulatory Visit | Attending: Family Medicine | Admitting: Family Medicine

## 2014-12-30 DIAGNOSIS — M479 Spondylosis, unspecified: Secondary | ICD-10-CM | POA: Insufficient documentation

## 2014-12-30 DIAGNOSIS — M47816 Spondylosis without myelopathy or radiculopathy, lumbar region: Secondary | ICD-10-CM | POA: Diagnosis not present

## 2014-12-30 DIAGNOSIS — Z8546 Personal history of malignant neoplasm of prostate: Secondary | ICD-10-CM | POA: Diagnosis not present

## 2014-12-30 DIAGNOSIS — M545 Low back pain: Secondary | ICD-10-CM | POA: Diagnosis present

## 2014-12-30 DIAGNOSIS — C61 Malignant neoplasm of prostate: Secondary | ICD-10-CM | POA: Diagnosis not present

## 2014-12-30 DIAGNOSIS — M4806 Spinal stenosis, lumbar region: Secondary | ICD-10-CM | POA: Insufficient documentation

## 2014-12-30 DIAGNOSIS — M5126 Other intervertebral disc displacement, lumbar region: Secondary | ICD-10-CM | POA: Diagnosis not present

## 2014-12-30 DIAGNOSIS — R29898 Other symptoms and signs involving the musculoskeletal system: Secondary | ICD-10-CM | POA: Diagnosis not present

## 2014-12-30 DIAGNOSIS — M7138 Other bursal cyst, other site: Secondary | ICD-10-CM | POA: Insufficient documentation

## 2015-01-03 ENCOUNTER — Other Ambulatory Visit: Payer: Self-pay | Admitting: *Deleted

## 2015-01-03 DIAGNOSIS — M5136 Other intervertebral disc degeneration, lumbar region: Secondary | ICD-10-CM

## 2015-01-04 ENCOUNTER — Telehealth: Payer: Self-pay | Admitting: *Deleted

## 2015-01-04 ENCOUNTER — Encounter: Payer: Self-pay | Admitting: Family Medicine

## 2015-01-04 DIAGNOSIS — M5136 Other intervertebral disc degeneration, lumbar region: Secondary | ICD-10-CM

## 2015-01-04 DIAGNOSIS — M47816 Spondylosis without myelopathy or radiculopathy, lumbar region: Secondary | ICD-10-CM

## 2015-01-04 DIAGNOSIS — M5126 Other intervertebral disc displacement, lumbar region: Secondary | ICD-10-CM

## 2015-01-04 NOTE — Telephone Encounter (Signed)
Orthopedic referral placed

## 2015-01-09 ENCOUNTER — Other Ambulatory Visit: Payer: Self-pay | Admitting: Family Medicine

## 2015-01-09 DIAGNOSIS — M5136 Other intervertebral disc degeneration, lumbar region: Secondary | ICD-10-CM

## 2015-01-10 ENCOUNTER — Ambulatory Visit (HOSPITAL_COMMUNITY): Admission: RE | Admit: 2015-01-10 | Payer: Medicare Other | Source: Ambulatory Visit

## 2015-01-12 DIAGNOSIS — M47816 Spondylosis without myelopathy or radiculopathy, lumbar region: Secondary | ICD-10-CM | POA: Diagnosis not present

## 2015-01-12 DIAGNOSIS — M545 Low back pain: Secondary | ICD-10-CM | POA: Diagnosis not present

## 2015-01-12 DIAGNOSIS — M4806 Spinal stenosis, lumbar region: Secondary | ICD-10-CM | POA: Diagnosis not present

## 2015-01-17 ENCOUNTER — Ambulatory Visit (INDEPENDENT_AMBULATORY_CARE_PROVIDER_SITE_OTHER): Payer: Medicare Other | Admitting: Family Medicine

## 2015-01-17 ENCOUNTER — Encounter: Payer: Self-pay | Admitting: Family Medicine

## 2015-01-17 VITALS — BP 120/64 | HR 63 | Ht 69.0 in | Wt 216.0 lb

## 2015-01-17 DIAGNOSIS — M4806 Spinal stenosis, lumbar region: Secondary | ICD-10-CM | POA: Diagnosis not present

## 2015-01-17 DIAGNOSIS — M4310 Spondylolisthesis, site unspecified: Secondary | ICD-10-CM

## 2015-01-17 DIAGNOSIS — C61 Malignant neoplasm of prostate: Secondary | ICD-10-CM | POA: Diagnosis not present

## 2015-01-17 DIAGNOSIS — Q762 Congenital spondylolisthesis: Secondary | ICD-10-CM

## 2015-01-17 DIAGNOSIS — E1159 Type 2 diabetes mellitus with other circulatory complications: Secondary | ICD-10-CM | POA: Diagnosis not present

## 2015-01-17 DIAGNOSIS — M48061 Spinal stenosis, lumbar region without neurogenic claudication: Secondary | ICD-10-CM | POA: Insufficient documentation

## 2015-01-17 DIAGNOSIS — E1151 Type 2 diabetes mellitus with diabetic peripheral angiopathy without gangrene: Secondary | ICD-10-CM

## 2015-01-17 NOTE — Patient Instructions (Addendum)
2 weeks after the last shot can decrease the Effexor every other day x 2 weeks and then can stop.   Increase by one unit every 3 days.

## 2015-01-17 NOTE — Progress Notes (Signed)
   Subjective:    Patient ID: Clayton Harrison, male    DOB: 07/30/1930, 81 y.o.   MRN: [id number]  HPI Ortho looked at the MRIs and they felt his arthritis is causing his back. Says they recommend PT, not steroid or injections at this time.  Plans to start soon.  Hasn't been as active.   Prostatic adenocarcinoma-He will be on Fermagon for about 2 more months.  Wants to know how to wean the Effexor when it comes time.  She shot causes dizziness and elevated BP and doesn't feel well right after the shot. He still most frustrated by the low energy and the hot flashes.  DM-he gradually went up to 12 units of Toujeo, but stopped at that dose. He says the medication was starting to work well in controlling his blood sugars. Says after last fermagon shot sugars our back up in 200s.  He wanted to know how to dose his medication at this point.  Review of Systems     Objective:   Physical Exam  Constitutional: He is oriented to person, place, and time. He appears well-developed and well-nourished.  HENT:  Head: Normocephalic and atraumatic.  Cardiovascular: Normal rate, regular rhythm and normal heart sounds.   Pulmonary/Chest: Effort normal and breath sounds normal.  Neurological: He is alert and oriented to person, place, and time.  Skin: Skin is warm and dry.  Psychiatric: He has a normal mood and affect. His behavior is normal.          Assessment & Plan:  Low back pain-hopefully will start physical therapy in the next week or 2 and have a follow-up back with orthopedist in about a month.  DM- uncontrolled. Gradually increase Toujeo by 1 unit every 3-5 days until blood sugars in the morning fasting are under 130. And then stay at that dose until I see him back in September. He has any questions or concerns can always call back sooner. Next  Prostatic adenocarcinoma-we'll continue injections for 2 more months. I did go ahead and write out a taper/wean on the Effexor that he can start about 2  weeks after his last injection. It may take a few months there for the hot flashes to completely resolve after the last injection and reminded him of this.

## 2015-01-23 DIAGNOSIS — M47816 Spondylosis without myelopathy or radiculopathy, lumbar region: Secondary | ICD-10-CM | POA: Diagnosis not present

## 2015-01-25 DIAGNOSIS — M47816 Spondylosis without myelopathy or radiculopathy, lumbar region: Secondary | ICD-10-CM | POA: Diagnosis not present

## 2015-01-25 DIAGNOSIS — C61 Malignant neoplasm of prostate: Secondary | ICD-10-CM | POA: Diagnosis not present

## 2015-01-30 DIAGNOSIS — M47816 Spondylosis without myelopathy or radiculopathy, lumbar region: Secondary | ICD-10-CM | POA: Diagnosis not present

## 2015-02-01 DIAGNOSIS — R232 Flushing: Secondary | ICD-10-CM | POA: Diagnosis not present

## 2015-02-01 DIAGNOSIS — R5383 Other fatigue: Secondary | ICD-10-CM | POA: Diagnosis not present

## 2015-02-01 DIAGNOSIS — C61 Malignant neoplasm of prostate: Secondary | ICD-10-CM | POA: Diagnosis not present

## 2015-02-06 DIAGNOSIS — M47816 Spondylosis without myelopathy or radiculopathy, lumbar region: Secondary | ICD-10-CM | POA: Diagnosis not present

## 2015-02-08 DIAGNOSIS — E119 Type 2 diabetes mellitus without complications: Secondary | ICD-10-CM | POA: Diagnosis not present

## 2015-02-09 DIAGNOSIS — M47816 Spondylosis without myelopathy or radiculopathy, lumbar region: Secondary | ICD-10-CM | POA: Diagnosis not present

## 2015-02-13 DIAGNOSIS — M47816 Spondylosis without myelopathy or radiculopathy, lumbar region: Secondary | ICD-10-CM | POA: Diagnosis not present

## 2015-02-20 ENCOUNTER — Telehealth: Payer: Self-pay | Admitting: *Deleted

## 2015-02-20 NOTE — Telephone Encounter (Signed)
Pt stated that he has stopped the firmagon injections and is now looking to come off of the Effexor and wanted to know how he is to wean off of this med. He will be coming by the office later today and stated that he could pick up this information at the front desk. Will fwd to pcp for advice.Audelia Hives [place]

## 2015-02-20 NOTE — Telephone Encounter (Signed)
I believe he is taking the 37.5mg  daily. Decrease to every other day for 14 days and then stop.

## 2015-02-20 NOTE — Telephone Encounter (Signed)
Will leave this up front for pt to p/u.Audelia Hives [place]

## 2015-02-22 ENCOUNTER — Telehealth: Payer: Self-pay | Admitting: *Deleted

## 2015-02-22 NOTE — Telephone Encounter (Signed)
Pt called requesting samples of Toujeo will place name on samples and he can p/u at his convenience.Audelia Hives [place]

## 2015-02-24 ENCOUNTER — Other Ambulatory Visit: Payer: Self-pay | Admitting: Family Medicine

## 2015-03-02 ENCOUNTER — Other Ambulatory Visit: Payer: Self-pay | Admitting: Family Medicine

## 2015-03-10 ENCOUNTER — Other Ambulatory Visit: Payer: Self-pay | Admitting: Family Medicine

## 2015-03-14 ENCOUNTER — Encounter: Payer: Self-pay | Admitting: Family Medicine

## 2015-03-14 ENCOUNTER — Ambulatory Visit (INDEPENDENT_AMBULATORY_CARE_PROVIDER_SITE_OTHER): Payer: Medicare Other | Admitting: Family Medicine

## 2015-03-14 VITALS — BP 125/55 | HR 68 | Temp 97.7°F | Ht 69.0 in | Wt 213.0 lb

## 2015-03-14 DIAGNOSIS — I10 Essential (primary) hypertension: Secondary | ICD-10-CM | POA: Diagnosis not present

## 2015-03-14 DIAGNOSIS — E785 Hyperlipidemia, unspecified: Secondary | ICD-10-CM

## 2015-03-14 DIAGNOSIS — R42 Dizziness and giddiness: Secondary | ICD-10-CM | POA: Diagnosis not present

## 2015-03-14 DIAGNOSIS — Z23 Encounter for immunization: Secondary | ICD-10-CM

## 2015-03-14 DIAGNOSIS — R5383 Other fatigue: Secondary | ICD-10-CM | POA: Diagnosis not present

## 2015-03-14 DIAGNOSIS — E1159 Type 2 diabetes mellitus with other circulatory complications: Secondary | ICD-10-CM

## 2015-03-14 DIAGNOSIS — E1151 Type 2 diabetes mellitus with diabetic peripheral angiopathy without gangrene: Secondary | ICD-10-CM

## 2015-03-14 DIAGNOSIS — N183 Chronic kidney disease, stage 3 (moderate): Secondary | ICD-10-CM | POA: Diagnosis not present

## 2015-03-14 DIAGNOSIS — C61 Malignant neoplasm of prostate: Secondary | ICD-10-CM

## 2015-03-14 LAB — POCT GLYCOSYLATED HEMOGLOBIN (HGB A1C): Hemoglobin A1C: 7.3

## 2015-03-14 MED ORDER — MECLIZINE HCL 25 MG PO TABS: ORAL_TABLET | ORAL | Status: DC

## 2015-03-14 NOTE — Addendum Note (Signed)
Addended by: Teddy Spike on: 03/14/2015 05:13 PM   Modules accepted: Orders

## 2015-03-14 NOTE — Progress Notes (Signed)
Subjective:    Patient ID: Clayton Harrison, male    DOB: 05-Sep-1930, 81 y.o.   MRN: [id number]  HPI Diabetes - no hypoglycemic events. No wounds or sores that are not healing well. No increased thirst or urination. Checking glucose at home. Taking medications as prescribed without any side effects.  He is up to 16 units of Toujeo.  Last A1C was 8.4. Fasting blood sugars in the morning have ranged mostly from around the 100-240 over the month of August.  Low back Pain - did well with PT after 5 sessions.    Prostate and her carcinoma-now that he has completed his treatments he was able to wean off the Effexor which she was taking primarily for hot flashes.  Hypertension- Pt denies chest pain, SOB, dizziness, or heart palpitations.  Taking meds as directed w/o problems.  Denies medication side effects.  He did bring a log of home blood pressures. We'll aspirate pressure was 115/64. That he did have some elevated blood pressures in the 150s. The highest blood pressure was 173/74. Blood pressures for August overall looks much better   Has been having dizzy episdoes. Says will come for about 2 days and then resolve her several days at a time. He says when it happens it really only last a few minutes. Sometimes it can happen after standing up but not always. Sometimes it can happen if he just looks up into a cabinet but again not every time. Says not sure if related to his BP or sugars. He says he hasn't really paid attention to this. No other trigger specifically. This started while he was on treatment for prostate cancer. He felt like it was probably secondary to the medications and never really said much about it before. He is also just felt really fatigued and like he is having low energy.    Review of Systems No Chest pain  But says has been feeling tired like he did before he had open heart surgery.     Objective:   Physical Exam  Constitutional: He is oriented to person, place, and time. He  appears well-developed and well-nourished.  HENT:  Head: Normocephalic and atraumatic.  Neck: Neck supple. No thyromegaly present.  Cardiovascular: Normal rate, regular rhythm and normal heart sounds.   No carotid bruits.  Pulmonary/Chest: Effort normal and breath sounds normal.  Lymphadenopathy:    He has no cervical adenopathy.  Neurological: He is alert and oriented to person, place, and time.  Skin: Skin is warm and dry.  Psychiatric: He has a normal mood and affect. His behavior is normal.          Assessment & Plan:  DM- hemoglobin A1c significantly improved. A1c of 7.3 today which is down from previous of 8.4. His making great strides and doing well with the new start of insulin. We'll continue current regimen and have him continue work on diet and exercise and follow-up in 3 months. Due for CMP and lipids. Increase Toueo to 18 units.   HTN - well controlled. He is down about 3 pounds which is fantastic. Continue to work on diet and exercise. Really like for him to lose about 20 more pounds. I think it would make a big difference in how he feels. These for CMP and lipids.  Prostate Cancer - has completed therapy. Off of fermagon.  Off the effexor as well.   Low back pain - much better after physical therapy. The still has some tenderness right at  the edge of the tail bone. Next  Fatigue-we'll do some additional blood work today just to evaluate thyroid and for anemia..   Dizziness-unclear etiology. He is not feeling symptomatic right now. He says he felt dizzy and weak right before he had cardiac surgery years ago. He is not currently expressing any chest pain or discomfort or shortness of breath. Did do an EKG today. He also plans to get back in with Dr. Kirk Ruths, his cardiologist as he is due for an appointment anyway. I also encouraged him to track his blood pressures and blood sugars and marked the days where he feels dizzy to see if there may be a relationship to days  when his pressures are high or low. EKG today shows rate of 70 bpm, normal sinus rhythm with right bundle branch block.

## 2015-03-14 NOTE — Patient Instructions (Signed)
Increase Toujeo to 18 units.

## 2015-03-15 LAB — VITAMIN B12: Vitamin B-12: 356 pg/mL (ref 211–911)

## 2015-03-15 LAB — CBC WITH DIFFERENTIAL/PLATELET
Basophils Absolute: 0 10*3/uL (ref 0.0–0.1)
Basophils Relative: 0 % (ref 0–1)
Eosinophils Absolute: 0.1 10*3/uL (ref 0.0–0.7)
Eosinophils Relative: 2 % (ref 0–5)
HCT: 34.1 % — ABNORMAL LOW (ref 39.0–52.0)
Hemoglobin: 11.8 g/dL — ABNORMAL LOW (ref 13.0–17.0)
Lymphocytes Relative: 26 % (ref 12–46)
Lymphs Abs: 1.6 10*3/uL (ref 0.7–4.0)
MCH: 30 pg (ref 26.0–34.0)
MCHC: 34.6 g/dL (ref 30.0–36.0)
MCV: 86.8 fL (ref 78.0–100.0)
MPV: 10.4 fL (ref 8.6–12.4)
Monocytes Absolute: 0.6 10*3/uL (ref 0.1–1.0)
Monocytes Relative: 9 % (ref 3–12)
Neutro Abs: 4 10*3/uL (ref 1.7–7.7)
Neutrophils Relative %: 63 % (ref 43–77)
Platelets: 251 10*3/uL (ref 150–400)
RBC: 3.93 MIL/uL — ABNORMAL LOW (ref 4.22–5.81)
RDW: 14.2 % (ref 11.5–15.5)
WBC: 6.3 10*3/uL (ref 4.0–10.5)

## 2015-03-15 LAB — COMPLETE METABOLIC PANEL WITH GFR
ALT: 14 U/L (ref 9–46)
AST: 17 U/L (ref 10–35)
Albumin: 4.3 g/dL (ref 3.6–5.1)
Alkaline Phosphatase: 52 U/L (ref 40–115)
BUN: 32 mg/dL — ABNORMAL HIGH (ref 7–25)
CO2: 24 mmol/L (ref 20–31)
Calcium: 10.1 mg/dL (ref 8.6–10.3)
Chloride: 99 mmol/L (ref 98–110)
Creat: 1.46 mg/dL — ABNORMAL HIGH (ref 0.70–1.11)
GFR, Est African American: 51 mL/min — ABNORMAL LOW (ref >=60)
GFR, Est Non African American: 44 mL/min — ABNORMAL LOW (ref >=60)
Glucose, Bld: 210 mg/dL — ABNORMAL HIGH (ref 65–99)
Potassium: 5.1 mmol/L (ref 3.5–5.3)
Sodium: 137 mmol/L (ref 135–146)
Total Bilirubin: 0.4 mg/dL (ref 0.2–1.2)
Total Protein: 7.3 g/dL (ref 6.1–8.1)

## 2015-03-15 LAB — LIPID PANEL
Cholesterol: 206 mg/dL — ABNORMAL HIGH (ref 125–200)
HDL: 41 mg/dL (ref >=40)
LDL Cholesterol: 89 mg/dL (ref <130)
Total CHOL/HDL Ratio: 5 Ratio (ref <=5.0)
Triglycerides: 378 mg/dL — ABNORMAL HIGH (ref <150)
VLDL: 76 mg/dL — ABNORMAL HIGH (ref <30)

## 2015-03-15 LAB — TSH: TSH: 2.935 u[IU]/mL (ref 0.350–4.500)

## 2015-03-15 NOTE — Addendum Note (Signed)
Addended by: [name] D on: 03/15/2015 01:32 PM   Modules accepted: Orders

## 2015-03-23 ENCOUNTER — Ambulatory Visit (INDEPENDENT_AMBULATORY_CARE_PROVIDER_SITE_OTHER): Payer: Medicare Other

## 2015-03-23 DIAGNOSIS — N189 Chronic kidney disease, unspecified: Secondary | ICD-10-CM | POA: Diagnosis not present

## 2015-03-23 DIAGNOSIS — N183 Chronic kidney disease, stage 3 (moderate): Secondary | ICD-10-CM | POA: Diagnosis not present

## 2015-03-27 ENCOUNTER — Other Ambulatory Visit: Payer: Self-pay | Admitting: Family Medicine

## 2015-04-08 ENCOUNTER — Other Ambulatory Visit: Payer: Self-pay | Admitting: Family Medicine

## 2015-04-11 ENCOUNTER — Telehealth: Payer: Self-pay | Admitting: *Deleted

## 2015-04-11 DIAGNOSIS — R7989 Other specified abnormal findings of blood chemistry: Secondary | ICD-10-CM

## 2015-04-11 NOTE — Telephone Encounter (Signed)
Pt wanted to know with all of his health issues that are going on right now whether or not it would be a good idea for him to have cataract surgery. Will fwd to pcp for advice.Audelia Hives [place]

## 2015-04-11 NOTE — Telephone Encounter (Signed)
He is definitely ok for cataract surgery.

## 2015-04-13 ENCOUNTER — Telehealth: Payer: Self-pay | Admitting: Cardiology

## 2015-04-13 NOTE — Telephone Encounter (Signed)
No answer when dialed. 

## 2015-04-13 NOTE — Telephone Encounter (Signed)
Pt informed.Clayton Harrison  

## 2015-04-13 NOTE — Telephone Encounter (Addendum)
Pt's wife stated that he has been getting dizzy a lot more lately. The spells lasts for about 5-10 seconds and then go away. It happens more when he is getting out of the car or bending over. She states that it has been ongoing and daily. His BP's have been running fine. He has an Rx for meclizine and only takes the medication PRN when his sxs are really bad. He has been dx with a R bundle blockage from Dr. Stanford Breed and wonders if this could be a contributing factor, I told her that it could possibly have something to do with it. I told her that she should call their ofc and speak with them or make an appt.  Will fwd to pcp advice.Maryruth Eve, [place] Crocker

## 2015-04-13 NOTE — Addendum Note (Signed)
Addended by: [name] D on: 04/13/2015 09:40 PM   Modules accepted: Orders

## 2015-04-13 NOTE — Telephone Encounter (Signed)
Let recheck his kidney function and have him cut his amlodipine in half.

## 2015-04-13 NOTE — Telephone Encounter (Signed)
Clayton Harrison is calling to see if her husband had a electrical interruption last year when he came to see Dr. Stanford Breed .Marland Kitchen Please call   Thanks

## 2015-04-14 NOTE — Telephone Encounter (Signed)
Attempted to contact Pt, no answer. Left voicemail to return clinic call, callback information provided.

## 2015-04-14 NOTE — Telephone Encounter (Signed)
Pt wife returned clinic all, advised of Rx change and informed of blood work. Pt will get labs done on Monday.

## 2015-04-14 NOTE — Telephone Encounter (Signed)
Return call from patient's wife. She states pt was eval'd for dizziness by Dr. Tommy Rainwater office ~ 1 month ago. - he'd been having episodes recently. Not taking meclizine, although he was prescribed this by Dr. Ward Chatters. He denies syncopal episodes. States dizziness on standing w/ no BP changes.   BPs running between 110-140/70-80 for past week, HR between 68-75.  Advised use of meclizine - will route to Dr. Stanford Breed to further advise. Pt has f/u on 12/5 w/ Dr. Stanford Breed for yearly visit.

## 2015-04-14 NOTE — Telephone Encounter (Signed)
Change norvasc to 5 mg daily and follow bp Kirk Ruths

## 2015-04-17 ENCOUNTER — Encounter: Payer: Self-pay | Admitting: Family Medicine

## 2015-04-17 ENCOUNTER — Ambulatory Visit (INDEPENDENT_AMBULATORY_CARE_PROVIDER_SITE_OTHER): Payer: Medicare Other

## 2015-04-17 ENCOUNTER — Ambulatory Visit (INDEPENDENT_AMBULATORY_CARE_PROVIDER_SITE_OTHER): Payer: Medicare Other | Admitting: Family Medicine

## 2015-04-17 VITALS — BP 143/53 | HR 79 | Wt 213.0 lb

## 2015-04-17 DIAGNOSIS — R222 Localized swelling, mass and lump, trunk: Secondary | ICD-10-CM

## 2015-04-17 DIAGNOSIS — M546 Pain in thoracic spine: Secondary | ICD-10-CM

## 2015-04-17 DIAGNOSIS — D485 Neoplasm of uncertain behavior of skin: Secondary | ICD-10-CM | POA: Diagnosis not present

## 2015-04-17 DIAGNOSIS — R229 Localized swelling, mass and lump, unspecified: Secondary | ICD-10-CM

## 2015-04-17 DIAGNOSIS — L723 Sebaceous cyst: Secondary | ICD-10-CM

## 2015-04-17 DIAGNOSIS — R7989 Other specified abnormal findings of blood chemistry: Secondary | ICD-10-CM | POA: Diagnosis not present

## 2015-04-17 DIAGNOSIS — M549 Dorsalgia, unspecified: Secondary | ICD-10-CM

## 2015-04-17 DIAGNOSIS — R748 Abnormal levels of other serum enzymes: Secondary | ICD-10-CM | POA: Diagnosis not present

## 2015-04-17 LAB — BASIC METABOLIC PANEL WITH GFR
BUN: 18 mg/dL (ref 7–25)
CO2: 23 mmol/L (ref 20–31)
Calcium: 9.2 mg/dL (ref 8.6–10.3)
Chloride: 104 mmol/L (ref 98–110)
Creat: 1.31 mg/dL — ABNORMAL HIGH (ref 0.70–1.11)
GFR, Est African American: 58 mL/min — ABNORMAL LOW (ref >=60)
GFR, Est Non African American: 50 mL/min — ABNORMAL LOW (ref >=60)
Glucose, Bld: 129 mg/dL — ABNORMAL HIGH (ref 65–99)
Potassium: 5 mmol/L (ref 3.5–5.3)
Sodium: 138 mmol/L (ref 135–146)

## 2015-04-17 NOTE — Telephone Encounter (Signed)
Spoke with pt wife, pt just started 5 mg tablets on Friday. He has a f/u appt tomorrow

## 2015-04-17 NOTE — Progress Notes (Signed)
Subjective:    Patient ID: Clayton Harrison, male    DOB: June 06, 1931, 81 y.o.   MRN: [id number]  HPI Patient comes in today complaining about a knot on his upper back that has been painful x 6 days. He had been lifting weights the gym when he felt a pop that was suddnely painful. He decreased his dermatologist about it and they thought it could be to be getting her shingles but he has never actually broken out in a rash. They did mention possibly trying a muscle relaxer but they did not get a prescription for it. They have been using capsaicin cream on it which helps some.Has been doing warm compresses. Did take Tylenol- not really helping .    Review of Systems  BP 143/53 mmHg  Pulse 79  Wt 213 lb (96.616 kg)    Allergies  Allergen Reactions  . Shellfish Allergy Anaphylaxis    Swelling of the throat  . Atorvastatin     REACTION: urinary frequency  . Ezetimibe     REACTION: "hip problems"  . Flomax [Tamsulosin Hcl]     Complains of dizziness  . Pravastatin Sodium     REACTION: myalgia  . Prednisone Other (See Comments)    Crazy dreams, insomnia  . Rosuvastatin   . Statins   . Tizanidine Other (See Comments)    Insomnia, nightmares    Past Medical History  Diagnosis Date  . DM (diabetes mellitus) ([place])   . CAD (coronary artery disease)   . Vertigo   . HTN (hypertension)   . Other and unspecified hyperlipidemia   . Cerebrovascular disease, unspecified   . Elevated PSA   . Interstitial cystitis   . Statin intolerance   . Anemia, blood loss     history of  . PVD (peripheral vascular disease) ([place])   . Carotid artery occlusion   . Adenomatous colon polyp     2.5cm excised by right colectomy 2011  . Prostate cancer ([place]) 03/09/14    Gleason 4+5=9, volume 98 mL  . Cataract   . GERD (gastroesophageal reflux disease)   . Hypercholesteremia   . Hearing loss   . Blockage of coronary artery of heart ([place]) 2001    x 4  . Colon tumor 03/2010    right ascending  . Macular  degeneration   . Skin cancer 2012    melanoma upper back, removed  . SBO (small bowel obstruction) ([place]) 07/2013  . S/P radiation therapy 07/20/2014 through 09/13/2014     Prostate 7800 cGy in 40 sessions, seminal vesicles 5600 cGy in 40 sessions   . Vertigo     Past Surgical History  Procedure Laterality Date  . Kidney stone surgery  1972  . Coronary artery bypass graft  2001    4  . Partial colectomy  05/24/2010    Lap converted to open right proximal colectomy  . Left carpal tunnel release  2009  . Carotid endarterectomy  2012    right  . Tonsillectomy  1940  . Prostate biopsy  03/09/14    Gleason 9, vol 98 mL  . Dental surgery    . Ureterolithotomy    . Skin cancer excision  02/2011    upper back  . Cardiac catheterization  11/1999  . Appendectomy  05/2010    with right hemicolectomy  . Dental extractions Bilateral 1980    Social History   Social History  . Marital Status: Married    Spouse Name: [name]  .  Number of Children: 2  . Years of Education: HS   Occupational History  . Retired    Social History Main Topics  . Smoking status: Former Smoker -- 2.00 packs/day for 40 years    Quit date: 11/23/1999  . Smokeless tobacco: Current User    Types: Chew  . Alcohol Use: No  . Drug Use: No  . Sexual Activity:    Partners: Female   Other Topics Concern  . Not on file   Social History Narrative   No regular exercise. Daily caffeine- 3-4 cups daily          Family History  Problem Relation Age of Onset  . Diabetes Mother   . Heart disease Mother   . Heart failure Mother   . Hypertension Mother   . Heart disease Father   . Colon cancer Maternal Uncle     x4  . Cancer Maternal Uncle     colon  . Cancer Daughter     breast  . Cancer Daughter     breast  . Cancer Cousin     colon    Outpatient Encounter Prescriptions as of 04/17/2015  Medication Sig  . acetaminophen (TYLENOL)  500 MG tablet Take 1,000 mg by mouth every 6 (six) hours as needed for mild pain.  Marland Kitchen amLODipine (NORVASC) 10 MG tablet TAKE 1 TABLET BY MOUTH DAILY. (Patient taking differently: pt taking 1/2 tablet daily)  . aspirin 325 MG tablet Take 325 mg by mouth daily.    . Calcium Carb-Cholecalciferol (CALCIUM PLUS VITAMIN D3) 600-500 MG-UNIT CAPS Take 1 tablet by mouth 2 (two) times daily.  Marland Kitchen glipiZIDE (GLUCOTROL) 10 MG tablet TAKE 1 TABLET BY MOUTH TWICE A DAY BEFORE A MEAL  . Insulin Glargine (TOUJEO SOLOSTAR) 300 UNIT/ML SOPN Inject 12 Units into the skin at bedtime.  . Insulin Pen Needle 32G X 4 MM MISC Inject once daily subcutaneously.  . lansoprazole (PREVACID) 15 MG capsule Take 1 capsule (15 mg total) by mouth daily as needed (acid reflux).  Marland Kitchen lisinopril (PRINIVIL,ZESTRIL) 20 MG tablet TAKE 1 TABLET BY MOUTH AT BEDTIME (Patient taking differently: TAKE 1/2 TABLET BY MOUTH AT BEDTIME)  . meclizine (ANTIVERT) 25 MG tablet TAKE 1/2 TABLET BY MOUTH AS NEEDED FOR DIZZINESS  . metFORMIN (GLUCOPHAGE) 1000 MG tablet TAKE 1 TABLET BY MOUTH 2 TIMES DAILY WITH A MEAL  . metoprolol (LOPRESSOR) 50 MG tablet TAKE 1 TABLET BY MOUTH TWICE A DAY  . Multiple Vitamins-Minerals (PRESERVISION AREDS 2 PO) Take 1 tablet by mouth 2 (two) times daily.   Vladimir Faster Glycol-Propyl Glycol (SYSTANE) 0.4-0.3 % SOLN Apply 2 drops to eye 2 (two) times daily as needed (dry eye).  . valACYclovir (VALTREX) 1000 MG tablet    No facility-administered encounter medications on file as of 04/17/2015.          Objective:   Physical Exam  Constitutional: He is oriented to person, place, and time. He appears well-developed and well-nourished.  Musculoskeletal:  approx 6 cm mass on the left upper back.  Just below that has an approx 4-5 cm sebaceous cyst.    Neurological: He is alert and oriented to person, place, and time.  Skin: Skin is warm and dry.  Psychiatric: He has a normal mood and affect. His behavior is normal.           Assessment & Plan:  Mass on the left upper back. Will get [place] to confirm in lipoma.  Says his pain is better today.  I think he may have just pulled a muscle under the lipoma.  He is feeling much better than he was 3 days ago.  Ok to use Aleve for a few days but not prolonged use. Work on stretches and heat. Will call with [place] results.    Upper back pain - likely MSK changes.  See note above.  GAve reassurance that I don't think this is shingles.  He has had ss for 5-6 days with no sign of rash yet.    SEbacous cysts - he has a large one on his back and one on his anterior chest.  GAve reassurance.

## 2015-04-17 NOTE — Addendum Note (Signed)
Addended by: Teddy Spike on: 04/17/2015 11:07 AM   Modules accepted: Orders

## 2015-04-17 NOTE — Telephone Encounter (Signed)
Left message for pt to call.

## 2015-04-18 ENCOUNTER — Encounter: Payer: Self-pay | Admitting: Cardiology

## 2015-04-18 ENCOUNTER — Encounter: Payer: Self-pay | Admitting: *Deleted

## 2015-04-18 ENCOUNTER — Ambulatory Visit (INDEPENDENT_AMBULATORY_CARE_PROVIDER_SITE_OTHER): Payer: Medicare Other | Admitting: Cardiology

## 2015-04-18 VITALS — BP 132/74 | HR 62 | Ht 69.0 in | Wt 212.7 lb

## 2015-04-18 DIAGNOSIS — E785 Hyperlipidemia, unspecified: Secondary | ICD-10-CM

## 2015-04-18 DIAGNOSIS — I2510 Atherosclerotic heart disease of native coronary artery without angina pectoris: Secondary | ICD-10-CM

## 2015-04-18 DIAGNOSIS — I679 Cerebrovascular disease, unspecified: Secondary | ICD-10-CM | POA: Diagnosis not present

## 2015-04-18 MED ORDER — EZETIMIBE 10 MG PO TABS: 10.0000 mg | ORAL_TABLET | Freq: Every day | ORAL | Status: DC

## 2015-04-18 NOTE — Assessment & Plan Note (Signed)
Plan follow-up carotid Dopplers November 2016.

## 2015-04-18 NOTE — Patient Instructions (Addendum)
Medication Instructions:   START ZETIA 10 MG ONCE DAILY  Labwork:  Your physician recommends that you return for lab work in: 4 WEEKS= DO NOT EAT PRIOR TO LAB WORK  Testing/Procedures:  Your physician has requested that you have a lexiscan myoview. For further information please visit [email or web address]. Please follow instruction sheet, as given.  Your physician has requested that you have a carotid duplex. This test is an ultrasound of the carotid arteries in your neck. It looks at blood flow through these arteries that supply the brain with blood. Allow one hour for this exam. There are no restrictions or special instructions.DUE IN NOVEMBER      Follow-Up:  Your physician recommends that you schedule a follow-up appointment in: [place]  If you need a refill on your cardiac medications before your next appointment, please call your pharmacy.

## 2015-04-18 NOTE — Assessment & Plan Note (Signed)
Patient has not tolerated statins previously. I will try Zetia 10 mg daily. Check lipids and liver in 4 weeks.

## 2015-04-18 NOTE — Assessment & Plan Note (Signed)
Patient was complaining of orthostatic symptoms. These have resolved with decreasing amlodipine. We will continue present medications and follow.

## 2015-04-18 NOTE — Assessment & Plan Note (Signed)
Patient is describing occasional indigestion symptoms. I will plan a nuclear study for risk stratification. Continue aspirin. Intolerant to statins.

## 2015-04-18 NOTE — Progress Notes (Signed)
HPI: FU coronary artery disease, status post coronary artery bypass graft in June 2001. Last Myoview in May 2014 revealed EF of 53% and fixed apical perfusion defect representing prior MI versus thinning. Abdominal ultrasound in June of 2006 showed no aneurysm. Carotid Dopplers November 2015 showed mild plaque bilaterally and follow-up recommended 1 year. Since I last saw him, The patient has noticed dizziness with standing. We decreased his amlodipine dose to 5 mg daily and his symptoms have resolved. He has had mild indigestion but denies exertional chest pain. No dyspnea or syncope. He does complain of generalized fatigue.   Current Outpatient Prescriptions  Medication Sig Dispense Refill  . acetaminophen (TYLENOL) 500 MG tablet Take 1,000 mg by mouth every 6 (six) hours as needed for mild pain.    Marland Kitchen amLODipine (NORVASC) 10 MG tablet TAKE 1 TABLET BY MOUTH DAILY. (Patient taking differently: pt taking 1/2 tablet daily) 90 tablet 0  . aspirin 325 MG tablet Take 325 mg by mouth daily.      . Calcium Carb-Cholecalciferol (CALCIUM PLUS VITAMIN D3) 600-500 MG-UNIT CAPS Take 1 tablet by mouth 2 (two) times daily.    Marland Kitchen glipiZIDE (GLUCOTROL) 10 MG tablet TAKE 1 TABLET BY MOUTH TWICE A DAY BEFORE A MEAL 60 tablet 0  . Insulin Glargine (TOUJEO SOLOSTAR) 300 UNIT/ML SOPN Inject 12 Units into the skin at bedtime. 3 mL 2  . Insulin Pen Needle 32G X 4 MM MISC Inject once daily subcutaneously. 100 each 3  . lansoprazole (PREVACID) 15 MG capsule Take 1 capsule (15 mg total) by mouth daily as needed (acid reflux). 90 capsule 1  . lisinopril (PRINIVIL,ZESTRIL) 20 MG tablet TAKE 1 TABLET BY MOUTH AT BEDTIME (Patient taking differently: TAKE 1/2 TABLET BY MOUTH AT BEDTIME) 90 tablet 0  . meclizine (ANTIVERT) 25 MG tablet TAKE 1/2 TABLET BY MOUTH AS NEEDED FOR DIZZINESS 30 tablet 2  . metFORMIN (GLUCOPHAGE) 1000 MG tablet TAKE 1 TABLET BY MOUTH 2 TIMES DAILY WITH A MEAL 180 tablet 0  . metoprolol (LOPRESSOR)  50 MG tablet TAKE 1 TABLET BY MOUTH TWICE A DAY 180 tablet 1  . Multiple Vitamins-Minerals (PRESERVISION AREDS 2 PO) Take 1 tablet by mouth 2 (two) times daily.     Vladimir Faster Glycol-Propyl Glycol (SYSTANE) 0.4-0.3 % SOLN Apply 2 drops to eye 2 (two) times daily as needed (dry eye).    . valACYclovir (VALTREX) 1000 MG tablet      No current facility-administered medications for this visit.     Past Medical History  Diagnosis Date  . DM (diabetes mellitus) ([place])   . CAD (coronary artery disease)   . Vertigo   . HTN (hypertension)   . Other and unspecified hyperlipidemia   . Cerebrovascular disease, unspecified   . Elevated PSA   . Interstitial cystitis   . Statin intolerance   . Anemia, blood loss     history of  . PVD (peripheral vascular disease) ([place])   . Carotid artery occlusion   . Adenomatous colon polyp     2.5cm excised by right colectomy 2011  . Prostate cancer ([place]) 03/09/14    Gleason 4+5=9, volume 98 mL  . Cataract   . GERD (gastroesophageal reflux disease)   . Hypercholesteremia   . Hearing loss   . Blockage of coronary artery of heart ([place]) 2001    x 4  . Colon tumor 03/2010    right ascending  . Macular degeneration   . Skin cancer 2012  melanoma upper back, removed  . SBO (small bowel obstruction) ([place]) 07/2013  . S/P radiation therapy 07/20/2014 through 09/13/2014     Prostate 7800 cGy in 40 sessions, seminal vesicles 5600 cGy in 40 sessions   . Vertigo     Past Surgical History  Procedure Laterality Date  . Kidney stone surgery  1972  . Coronary artery bypass graft  2001    4  . Partial colectomy  05/24/2010    Lap converted to open right proximal colectomy  . Left carpal tunnel release  2009  . Carotid endarterectomy  2012    right  . Tonsillectomy  1940  . Prostate biopsy  03/09/14    Gleason 9, vol 98 mL  . Dental surgery    . Ureterolithotomy    . Skin cancer  excision  02/2011    upper back  . Cardiac catheterization  11/1999  . Appendectomy  05/2010    with right hemicolectomy  . Dental extractions Bilateral 1980    Social History   Social History  . Marital Status: Married    Spouse Name: [name]  . Number of Children: 2  . Years of Education: HS   Occupational History  . Retired    Social History Main Topics  . Smoking status: Former Smoker -- 2.00 packs/day for 40 years    Quit date: 11/23/1999  . Smokeless tobacco: Current User    Types: Chew  . Alcohol Use: No  . Drug Use: No  . Sexual Activity:    Partners: Female   Other Topics Concern  . Not on file   Social History Narrative   No regular exercise. Daily caffeine- 3-4 cups daily          ROS: no fevers or chills, productive cough, hemoptysis, dysphasia, odynophagia, melena, hematochezia, dysuria, hematuria, rash, seizure activity, orthopnea, PND, pedal edema, claudication. Remaining systems are negative.  Physical Exam: Well-developed well-nourished in no acute distress.  Skin is warm and dry.  HEENT is normal.  Neck is supple.  Chest is clear to auscultation with normal expansion.  Cardiovascular exam is regular rate and rhythm.  Abdominal exam nontender or distended. No masses palpated. Extremities show no edema. neuro grossly intact  ECG 03/14/2015-sinus rhythm, right bundle branch block, lateral T-wave inversion.

## 2015-04-20 DIAGNOSIS — E113292 Type 2 diabetes mellitus with mild nonproliferative diabetic retinopathy without macular edema, left eye: Secondary | ICD-10-CM | POA: Diagnosis not present

## 2015-04-20 DIAGNOSIS — H2511 Age-related nuclear cataract, right eye: Secondary | ICD-10-CM | POA: Diagnosis not present

## 2015-04-20 DIAGNOSIS — H2512 Age-related nuclear cataract, left eye: Secondary | ICD-10-CM | POA: Diagnosis not present

## 2015-04-20 DIAGNOSIS — E113391 Type 2 diabetes mellitus with moderate nonproliferative diabetic retinopathy without macular edema, right eye: Secondary | ICD-10-CM | POA: Diagnosis not present

## 2015-04-20 DIAGNOSIS — H25011 Cortical age-related cataract, right eye: Secondary | ICD-10-CM | POA: Diagnosis not present

## 2015-04-24 ENCOUNTER — Encounter: Payer: Self-pay | Admitting: Family Medicine

## 2015-04-24 ENCOUNTER — Encounter: Payer: Self-pay | Admitting: Cardiology

## 2015-05-03 ENCOUNTER — Encounter (HOSPITAL_COMMUNITY): Payer: Medicare Other

## 2015-05-08 ENCOUNTER — Other Ambulatory Visit: Payer: Self-pay | Admitting: Family Medicine

## 2015-05-08 DIAGNOSIS — R5383 Other fatigue: Secondary | ICD-10-CM | POA: Diagnosis not present

## 2015-05-08 DIAGNOSIS — C61 Malignant neoplasm of prostate: Secondary | ICD-10-CM | POA: Diagnosis not present

## 2015-05-09 DIAGNOSIS — H2511 Age-related nuclear cataract, right eye: Secondary | ICD-10-CM | POA: Diagnosis not present

## 2015-05-09 DIAGNOSIS — H538 Other visual disturbances: Secondary | ICD-10-CM | POA: Diagnosis not present

## 2015-05-15 DIAGNOSIS — C61 Malignant neoplasm of prostate: Secondary | ICD-10-CM | POA: Diagnosis not present

## 2015-05-15 DIAGNOSIS — R5383 Other fatigue: Secondary | ICD-10-CM | POA: Diagnosis not present

## 2015-05-15 DIAGNOSIS — E291 Testicular hypofunction: Secondary | ICD-10-CM | POA: Diagnosis not present

## 2015-05-22 ENCOUNTER — Other Ambulatory Visit: Payer: Self-pay | Admitting: Family Medicine

## 2015-05-24 ENCOUNTER — Inpatient Hospital Stay (HOSPITAL_COMMUNITY): Admission: RE | Admit: 2015-05-24 | Payer: Medicare Other | Source: Ambulatory Visit

## 2015-05-24 DIAGNOSIS — H25012 Cortical age-related cataract, left eye: Secondary | ICD-10-CM | POA: Diagnosis not present

## 2015-05-24 DIAGNOSIS — H2512 Age-related nuclear cataract, left eye: Secondary | ICD-10-CM | POA: Diagnosis not present

## 2015-05-29 ENCOUNTER — Ambulatory Visit: Payer: Medicare Other | Admitting: Cardiology

## 2015-05-30 DIAGNOSIS — H2512 Age-related nuclear cataract, left eye: Secondary | ICD-10-CM | POA: Diagnosis not present

## 2015-06-07 ENCOUNTER — Other Ambulatory Visit: Payer: Self-pay | Admitting: Family Medicine

## 2015-06-10 ENCOUNTER — Other Ambulatory Visit: Payer: Self-pay | Admitting: Family Medicine

## 2015-06-13 ENCOUNTER — Encounter: Payer: Self-pay | Admitting: Family Medicine

## 2015-06-13 ENCOUNTER — Ambulatory Visit (INDEPENDENT_AMBULATORY_CARE_PROVIDER_SITE_OTHER): Payer: Medicare Other | Admitting: Family Medicine

## 2015-06-13 VITALS — BP 162/70 | HR 70 | Wt 219.0 lb

## 2015-06-13 DIAGNOSIS — E1151 Type 2 diabetes mellitus with diabetic peripheral angiopathy without gangrene: Secondary | ICD-10-CM

## 2015-06-13 DIAGNOSIS — R635 Abnormal weight gain: Secondary | ICD-10-CM | POA: Diagnosis not present

## 2015-06-13 DIAGNOSIS — I10 Essential (primary) hypertension: Secondary | ICD-10-CM

## 2015-06-13 DIAGNOSIS — T50905A Adverse effect of unspecified drugs, medicaments and biological substances, initial encounter: Secondary | ICD-10-CM

## 2015-06-13 DIAGNOSIS — E119 Type 2 diabetes mellitus without complications: Secondary | ICD-10-CM | POA: Diagnosis not present

## 2015-06-13 LAB — POCT UA - MICROALBUMIN
Creatinine, POC: 300 mg/dL
Microalbumin Ur, POC: 150 mg/L

## 2015-06-13 LAB — POCT GLYCOSYLATED HEMOGLOBIN (HGB A1C): Hemoglobin A1C: 6.9

## 2015-06-13 MED ORDER — INSULIN GLARGINE 300 UNIT/ML ~~LOC~~ SOPN: 20.0000 [IU] | PEN_INJECTOR | Freq: Every day | SUBCUTANEOUS | Status: DC

## 2015-06-13 NOTE — Progress Notes (Signed)
   Subjective:    Patient ID: Clayton Harrison, male    DOB: 1930-12-03, 81 y.o.   MRN: [id number]  HPI Diabetes - no hypoglycemic events. No wounds or sores that are not healing well. No increased thirst or urination. Checking glucose at home. Taking medications as prescribed without any side effects. Sugars running under 130.  He is now using 20 units.    Hypertension- Pt denies chest pain, SOB, dizziness, or heart palpitations.  Taking meds as directed w/o problems.  Denies medication side effects.   His weight is 7 lbs.    Saw Dr. Stanford Breed for abnormal EKG.  Has further cardiac w/u in Feb.   CKD 3 - doing well  Overall.  Due to recheck function at next OV.  Urinating without any problems.     Review of Systems     Objective:   Physical Exam  Constitutional: He is oriented to person, place, and time. He appears well-developed and well-nourished.  HENT:  Head: Normocephalic and atraumatic.  Cardiovascular: Normal rate, regular rhythm and normal heart sounds.   Pulmonary/Chest: Effort normal and breath sounds normal.  Neurological: He is alert and oriented to person, place, and time.  Skin: Skin is warm and dry.  Psychiatric: He has a normal mood and affect. His behavior is normal.          Assessment & Plan:   Diabetes-A1c is 6.9 today. Back under 7.0!!! Doing well overall.  Continue current regimen. Continue to work on diet and exercise. I would like for hm to have lost 3-5 lbs when I see him back. He feels confident he can do that.    Weight gain - Consider changing the glipizide.  May be contributing to weight gain. But would have to switch to more expensive med.    HTN- uncontrolled today. Work on diet and exercise and monitor BP over the next month.  If not coming down then we can adjust his regimen. He is currently taking half tab of amlodipine and half tab of lisinopril. '  CKD-3 - will check renal function at next appt.

## 2015-06-15 ENCOUNTER — Telehealth (HOSPITAL_COMMUNITY): Payer: Self-pay

## 2015-06-15 NOTE — Telephone Encounter (Signed)
Encounter complete. 

## 2015-06-21 ENCOUNTER — Ambulatory Visit (HOSPITAL_BASED_OUTPATIENT_CLINIC_OR_DEPARTMENT_OTHER)
Admission: RE | Admit: 2015-06-21 | Discharge: 2015-06-21 | Disposition: A | Discharge status: Home / Self Care | Payer: Medicare Other | Source: Ambulatory Visit | Attending: Cardiology | Admitting: Cardiology

## 2015-06-21 ENCOUNTER — Encounter: Payer: Self-pay | Admitting: Family Medicine

## 2015-06-21 ENCOUNTER — Ambulatory Visit (HOSPITAL_COMMUNITY)
Admission: RE | Admit: 2015-06-21 | Discharge: 2015-06-21 | Disposition: A | Discharge status: Home / Self Care | Payer: Medicare Other | Source: Ambulatory Visit | Attending: Cardiology | Admitting: Cardiology

## 2015-06-21 DIAGNOSIS — I739 Peripheral vascular disease, unspecified: Secondary | ICD-10-CM | POA: Insufficient documentation

## 2015-06-21 DIAGNOSIS — Z6831 Body mass index (BMI) 31.0-31.9, adult: Secondary | ICD-10-CM | POA: Diagnosis not present

## 2015-06-21 DIAGNOSIS — R5383 Other fatigue: Secondary | ICD-10-CM | POA: Diagnosis not present

## 2015-06-21 DIAGNOSIS — I779 Disorder of arteries and arterioles, unspecified: Secondary | ICD-10-CM | POA: Insufficient documentation

## 2015-06-21 DIAGNOSIS — Z87891 Personal history of nicotine dependence: Secondary | ICD-10-CM | POA: Diagnosis not present

## 2015-06-21 DIAGNOSIS — E663 Overweight: Secondary | ICD-10-CM | POA: Insufficient documentation

## 2015-06-21 DIAGNOSIS — R42 Dizziness and giddiness: Secondary | ICD-10-CM | POA: Insufficient documentation

## 2015-06-21 DIAGNOSIS — I6523 Occlusion and stenosis of bilateral carotid arteries: Secondary | ICD-10-CM | POA: Insufficient documentation

## 2015-06-21 DIAGNOSIS — I2510 Atherosclerotic heart disease of native coronary artery without angina pectoris: Secondary | ICD-10-CM | POA: Insufficient documentation

## 2015-06-21 DIAGNOSIS — R9439 Abnormal result of other cardiovascular function study: Secondary | ICD-10-CM | POA: Insufficient documentation

## 2015-06-21 DIAGNOSIS — I10 Essential (primary) hypertension: Secondary | ICD-10-CM | POA: Diagnosis not present

## 2015-06-21 DIAGNOSIS — I679 Cerebrovascular disease, unspecified: Secondary | ICD-10-CM | POA: Diagnosis not present

## 2015-06-21 DIAGNOSIS — Z8249 Family history of ischemic heart disease and other diseases of the circulatory system: Secondary | ICD-10-CM | POA: Insufficient documentation

## 2015-06-21 DIAGNOSIS — E119 Type 2 diabetes mellitus without complications: Secondary | ICD-10-CM | POA: Diagnosis not present

## 2015-06-21 LAB — MYOCARDIAL PERFUSION IMAGING
LV dias vol: 121 mL
LV sys vol: 47 mL
Peak HR: 80 {beats}/min
Rest HR: 62 {beats}/min
SDS: 3
SRS: 5
SSS: 8
TID: 1.17

## 2015-06-21 MED ORDER — REGADENOSON 0.4 MG/5ML IV SOLN
0.4000 mg | Freq: Once | INTRAVENOUS | Status: AC
  Administered 2015-06-21: 0.4 mg via INTRAVENOUS

## 2015-06-21 MED ORDER — TECHNETIUM TC 99M SESTAMIBI GENERIC - CARDIOLITE
30.6000 | Freq: Once | INTRAVENOUS | Status: AC | PRN
  Administered 2015-06-21: 30.6 via INTRAVENOUS

## 2015-06-21 MED ORDER — TECHNETIUM TC 99M SESTAMIBI GENERIC - CARDIOLITE
10.5000 | Freq: Once | INTRAVENOUS | Status: AC | PRN
  Administered 2015-06-21: 10.5 via INTRAVENOUS

## 2015-06-23 ENCOUNTER — Telehealth: Payer: Self-pay | Admitting: Cardiology

## 2015-06-23 NOTE — Telephone Encounter (Signed)
Please call,she has a specific question she needs ask concerning his stress test results.

## 2015-06-23 NOTE — Telephone Encounter (Signed)
Spoke to daughter  Daughter was concerned about results of stress test. She had reviewed information from [facility] chart. She wanted to know if patient needed an cath - "defect  " mentioned on report. RN informed her the test was normal per Dr Stanford Breed  She states she will not be able to come to appointment on 07/24/15 She will send a note of question for Dr Stanford Breed to address.

## 2015-06-27 ENCOUNTER — Encounter: Payer: Self-pay | Admitting: Osteopathic Medicine

## 2015-06-27 ENCOUNTER — Ambulatory Visit (INDEPENDENT_AMBULATORY_CARE_PROVIDER_SITE_OTHER): Payer: Medicare Other | Admitting: Osteopathic Medicine

## 2015-06-27 VITALS — BP 115/56 | HR 64 | Temp 98.2°F | Ht 69.0 in | Wt 215.0 lb

## 2015-06-27 DIAGNOSIS — R059 Cough, unspecified: Secondary | ICD-10-CM

## 2015-06-27 DIAGNOSIS — J019 Acute sinusitis, unspecified: Secondary | ICD-10-CM | POA: Diagnosis not present

## 2015-06-27 DIAGNOSIS — J069 Acute upper respiratory infection, unspecified: Secondary | ICD-10-CM

## 2015-06-27 DIAGNOSIS — B9789 Other viral agents as the cause of diseases classified elsewhere: Principal | ICD-10-CM

## 2015-06-27 DIAGNOSIS — R05 Cough: Secondary | ICD-10-CM | POA: Diagnosis not present

## 2015-06-27 MED ORDER — AMOXICILLIN-POT CLAVULANATE 875-125 MG PO TABS: 1.0000 | ORAL_TABLET | Freq: Two times a day (BID) | ORAL | Status: DC

## 2015-06-27 MED ORDER — IPRATROPIUM BROMIDE 0.03 % NA SOLN: 2.0000 | Freq: Two times a day (BID) | NASAL | Status: DC

## 2015-06-27 MED ORDER — GUAIFENESIN-CODEINE 100-10 MG/5ML PO SYRP: 5.0000 mL | ORAL_SOLUTION | Freq: Three times a day (TID) | ORAL | Status: DC | PRN

## 2015-06-27 MED ORDER — BENZONATATE 200 MG PO CAPS: 200.0000 mg | ORAL_CAPSULE | Freq: Three times a day (TID) | ORAL | Status: DC | PRN

## 2015-06-27 NOTE — Patient Instructions (Signed)

## 2015-06-27 NOTE — Progress Notes (Signed)
HPI: Clayton Harrison is a 82 y.o. male who presents to [facility]  today for chief complaint of:  Chief Complaint  Patient presents with  . Cough    . Location: chest . Quality: coughin . Severity: moderate . Duration: 7 days . Context: no sick contacts, no recent travel . Modifying factors: has tried the following OTC medications: Coridine (for HTN) and Delsym for coughing and Tylenol for pain   without relief . Assoc signs/symptoms: no fever/chills, no productive cough, Yes  body aches, No  GI upset   Past medical, social and family history reviewed: Past Medical History  Diagnosis Date  . DM (diabetes mellitus) ([place])   . CAD (coronary artery disease)   . Vertigo   . HTN (hypertension)   . Other and unspecified hyperlipidemia   . Cerebrovascular disease, unspecified   . Elevated PSA   . Interstitial cystitis   . Statin intolerance   . Anemia, blood loss     history of  . PVD (peripheral vascular disease) ([place])   . Carotid artery occlusion   . Adenomatous colon polyp     2.5cm excised by right colectomy 2011  . Prostate cancer ([place]) 03/09/14    Gleason 4+5=9, volume 98 mL  . Cataract   . GERD (gastroesophageal reflux disease)   . Hypercholesteremia   . Hearing loss   . Blockage of coronary artery of heart ([place]) 2001    x 4  . Colon tumor 03/2010    right ascending  . Macular degeneration   . Skin cancer 2012    melanoma upper back, removed  . SBO (small bowel obstruction) ([place]) 07/2013  . S/P radiation therapy 07/20/2014 through 09/13/2014     Prostate 7800 cGy in 40 sessions, seminal vesicles 5600 cGy in 40 sessions   . Vertigo    Past Surgical History  Procedure Laterality Date  . Kidney stone surgery  1972  . Coronary artery bypass graft  2001    4  . Partial colectomy  05/24/2010    Lap converted to open right proximal colectomy  . Left carpal tunnel  release  2009  . Carotid endarterectomy  2012    right  . Tonsillectomy  1940  . Prostate biopsy  03/09/14    Gleason 9, vol 98 mL  . Dental surgery    . Ureterolithotomy    . Skin cancer excision  02/2011    upper back  . Cardiac catheterization  11/1999  . Appendectomy  05/2010    with right hemicolectomy  . Dental extractions Bilateral 1980   Social History  Substance Use Topics  . Smoking status: Former Smoker -- 2.00 packs/day for 40 years    Quit date: 11/23/1999  . Smokeless tobacco: Current User    Types: Chew  . Alcohol Use: No   Family History  Problem Relation Age of Onset  . Diabetes Mother   . Heart disease Mother   . Heart failure Mother   . Hypertension Mother   . Heart disease Father   . Colon cancer Maternal Uncle     x4  . Cancer Maternal Uncle     colon  . Cancer Daughter     breast  . Cancer Daughter     breast  . Cancer Cousin     colon    Current Outpatient Prescriptions  Medication Sig Dispense Refill  . acetaminophen (TYLENOL) 500 MG tablet Take 1,000 mg by mouth every 6 (six)  hours as needed for mild pain.    Marland Kitchen amLODipine (NORVASC) 10 MG tablet Take 1 tablet (10 mg total) by mouth daily. APPOINTMENT NEEDED FOR FURTHER REFILLS 90 tablet 0  . aspirin 325 MG tablet Take 325 mg by mouth daily.      . Calcium Carb-Cholecalciferol (CALCIUM PLUS VITAMIN D3) 600-500 MG-UNIT CAPS Take 1 tablet by mouth 2 (two) times daily.    Marland Kitchen ezetimibe (ZETIA) 10 MG tablet Take 1 tablet (10 mg total) by mouth daily. 90 tablet 3  . glipiZIDE (GLUCOTROL) 10 MG tablet Take 1 tablet (10 mg total) by mouth 2 (two) times daily before a meal. APPOINTMENT NEEDED FOR FURTHER REFILLS 60 tablet 0  . Insulin Glargine (TOUJEO SOLOSTAR) 300 UNIT/ML SOPN Inject 20 Units into the skin at bedtime. 4.5 mL 2  . Insulin Pen Needle 32G X 4 MM MISC Inject once daily subcutaneously. 100 each 3  . Ketorolac Tromethamine 0.45 % SOLN Apply to eye.    . lansoprazole (PREVACID) 15 MG capsule  Take 1 capsule (15 mg total) by mouth daily as needed (acid reflux). 90 capsule 1  . lisinopril (PRINIVIL,ZESTRIL) 20 MG tablet TAKE 1 TABLET BY MOUTH AT BEDTIME 90 tablet 0  . meclizine (ANTIVERT) 25 MG tablet TAKE 1/2 TABLET BY MOUTH AS NEEDED FOR DIZZINESS 30 tablet 2  . metFORMIN (GLUCOPHAGE) 1000 MG tablet TAKE 1 TABLET BY MOUTH 2 TIMES DAILY WITH A MEAL 180 tablet 0  . metoprolol (LOPRESSOR) 50 MG tablet TAKE 1 TABLET BY MOUTH TWICE A DAY 180 tablet 1  . Multiple Vitamins-Minerals (PRESERVISION AREDS 2 PO) Take 1 tablet by mouth 2 (two) times daily.     Marland Kitchen ofloxacin (OCUFLOX) 0.3 % ophthalmic solution 1 drop 4 (four) times daily.    Vladimir Faster Glycol-Propyl Glycol (SYSTANE) 0.4-0.3 % SOLN Apply 2 drops to eye 2 (two) times daily as needed (dry eye).    . prednisoLONE acetate (PRED FORTE) 1 % ophthalmic suspension 1 drop 4 (four) times daily.    . valACYclovir (VALTREX) 1000 MG tablet      No current facility-administered medications for this visit.   Allergies  Allergen Reactions  . Shellfish Allergy Anaphylaxis    Swelling of the throat  . Atorvastatin     REACTION: urinary frequency  . Ezetimibe     REACTION: "hip problems"  . Flomax [Tamsulosin Hcl]     Complains of dizziness  . Pravastatin Sodium     REACTION: myalgia  . Prednisone Other (See Comments)    Crazy dreams, insomnia  . Rosuvastatin   . Statins   . Tizanidine Other (See Comments)    Insomnia, nightmares         V  Review of Systems: CONSTITUTIONAL: no fever/chills HEAD/EYES/EARS/NOSE/THROAT: yes headache, no vision change or hearing change, mild sore throat CARDIAC: No chest pain/pressure/palpitations, no orthopnea RESPIRATORY: yes cough, no shortness of breath GASTROINTESTINAL: no nausea, no vomiting, no abdominal pain/blood in stool/diarrhea/constipation MUSCULOSKELETAL: no myalgia/arthralgia   Exam:  BP 115/56 mmHg  Pulse 64  Temp(Src) 98.2 F (36.8 C) (Oral)  Ht 5\' 9"  (1.753 m)  Wt 215 lb  (97.523 kg)  BMI 31.74 kg/m2 Constitutional: VSS, see above. General Appearance: alert, well-developed, well-nourished, NAD Eyes: Normal lids and conjunctive, non-icteric sclera, PERRLA Ears, Nose, Mouth, Throat: Normal external inspection ears/nares/mouth/lips/gums, normal TM, MMM; posterior pharynx without erythema, without exudate Neck: No masses, trachea midline. No thyroid enlargement/tenderness/mass appreciated, normal lymph nodes Respiratory: Normal respiratory effort. No  wheeze/rhonchi/rales Cardiovascular: S1/S2 normal,  no murmur/rub/gallop auscultated. RRR. No carotid bruit or JVD. No lower extremity edema.   ASSESSMENT/PLAN: Most likely viral illness, lung exam clear, supportive care advised with prescription medications as below, antibiotic prescription printed to fill in 2-3 days if not doing any better, return to clinic if no better in one week or if worse in the meantime and would consider chest x-ray at that time  Viral upper respiratory tract infection with cough - Plan: ipratropium (ATROVENT) 0.03 % nasal spray  Cough - Plan: guaiFENesin-codeine (ROBITUSSIN AC) 100-10 MG/5ML syrup, benzonatate (TESSALON) 200 MG capsule  Acute sinusitis, recurrence not specified, unspecified location - Plan: amoxicillin-clavulanate (AUGMENTIN) 875-125 MG tablet Asian asked to fill prescription for antibiotics if he is not doing any better in the next 2-3 days,    Return if symptoms worsen or fail to improve.

## 2015-07-19 ENCOUNTER — Other Ambulatory Visit: Payer: Self-pay | Admitting: Family Medicine

## 2015-07-20 DIAGNOSIS — E785 Hyperlipidemia, unspecified: Secondary | ICD-10-CM | POA: Diagnosis not present

## 2015-07-20 NOTE — Progress Notes (Signed)
HPI: FU coronary artery disease, status post coronary artery bypass graft in June 2001. Abdominal ultrasound in June of 2006 showed no aneurysm. Carotid Dopplers Dec 2016 showed 40-59 bilateral stenosis and follow-up recommended 1 year. Nuclear study December 2016 showed ejection fraction 61%, apical thinning but no ischemia. Since I last saw him, the patient denies any dyspnea on exertion, orthopnea, PND, pedal edema, palpitations, syncope or chest pain.   Current Outpatient Prescriptions  Medication Sig Dispense Refill  . acetaminophen (TYLENOL) 500 MG tablet Take 1,000 mg by mouth every 6 (six) hours as needed for mild pain.    Marland Kitchen amLODipine (NORVASC) 10 MG tablet Take 1 tablet (10 mg total) by mouth daily. APPOINTMENT NEEDED FOR FURTHER REFILLS 90 tablet 0  . aspirin 325 MG tablet Take 325 mg by mouth daily.      Marland Kitchen ezetimibe (ZETIA) 10 MG tablet Take 1 tablet (10 mg total) by mouth daily. 90 tablet 3  . glipiZIDE (GLUCOTROL) 10 MG tablet TAKE 1 TABLET TWICE A DAY BEFORE A MEAL-NEED OFFICE VISIT FOR MORE REFILLS 60 tablet 2  . Insulin Glargine (TOUJEO SOLOSTAR) 300 UNIT/ML SOPN Inject 20 Units into the skin at bedtime. 4.5 mL 2  . Insulin Pen Needle 32G X 4 MM MISC Inject once daily subcutaneously. 100 each 3  . lansoprazole (PREVACID) 15 MG capsule Take 1 capsule (15 mg total) by mouth daily as needed (acid reflux). 90 capsule 1  . lisinopril (PRINIVIL,ZESTRIL) 20 MG tablet TAKE 1 TABLET BY MOUTH AT BEDTIME 90 tablet 0  . meclizine (ANTIVERT) 25 MG tablet TAKE 1/2 TABLET BY MOUTH AS NEEDED FOR DIZZINESS 30 tablet 2  . metFORMIN (GLUCOPHAGE) 1000 MG tablet TAKE 1 TABLET BY MOUTH 2 TIMES DAILY WITH A MEAL 180 tablet 0  . metoprolol (LOPRESSOR) 50 MG tablet TAKE 1 TABLET BY MOUTH TWICE A DAY 180 tablet 1   No current facility-administered medications for this visit.     Past Medical History  Diagnosis Date  . DM (diabetes mellitus) ([place])   . CAD (coronary artery disease)   .  Vertigo   . HTN (hypertension)   . Other and unspecified hyperlipidemia   . Cerebrovascular disease, unspecified   . Elevated PSA   . Interstitial cystitis   . Statin intolerance   . Anemia, blood loss     history of  . PVD (peripheral vascular disease) ([place])   . Carotid artery occlusion   . Adenomatous colon polyp     2.5cm excised by right colectomy 2011  . Prostate cancer ([place]) 03/09/14    Gleason 4+5=9, volume 98 mL  . Cataract   . GERD (gastroesophageal reflux disease)   . Hypercholesteremia   . Hearing loss   . Blockage of coronary artery of heart ([place]) 2001    x 4  . Colon tumor 03/2010    right ascending  . Macular degeneration   . Skin cancer 2012    melanoma upper back, removed  . SBO (small bowel obstruction) ([place]) 07/2013  . S/P radiation therapy 07/20/2014 through 09/13/2014     Prostate 7800 cGy in 40 sessions, seminal vesicles 5600 cGy in 40 sessions   . Vertigo     Past Surgical History  Procedure Laterality Date  . Kidney stone surgery  1972  . Coronary artery bypass graft  2001    4  . Partial colectomy  05/24/2010    Lap converted to open right proximal colectomy  . Left carpal tunnel  release  2009  . Carotid endarterectomy  2012    right  . Tonsillectomy  1940  . Prostate biopsy  03/09/14    Gleason 9, vol 98 mL  . Dental surgery    . Ureterolithotomy    . Skin cancer excision  02/2011    upper back  . Cardiac catheterization  11/1999  . Appendectomy  05/2010    with right hemicolectomy  . Dental extractions Bilateral 1980    Social History   Social History  . Marital Status: Married    Spouse Name: [name]  . Number of Children: 2  . Years of Education: HS   Occupational History  . Retired    Social History Main Topics  . Smoking status: Former Smoker -- 2.00 packs/day for 40 years    Quit date: 11/23/1999  . Smokeless tobacco: Current User    Types: Chew  .  Alcohol Use: No  . Drug Use: No  . Sexual Activity:    Partners: Female   Other Topics Concern  . Not on file   Social History Narrative   No regular exercise. Daily caffeine- 3-4 cups daily          Family History  Problem Relation Age of Onset  . Diabetes Mother   . Heart disease Mother   . Heart failure Mother   . Hypertension Mother   . Heart disease Father   . Colon cancer Maternal Uncle     x4  . Cancer Maternal Uncle     colon  . Cancer Daughter     breast  . Cancer Daughter     breast  . Cancer Cousin     colon    ROS: no fevers or chills, productive cough, hemoptysis, dysphasia, odynophagia, melena, hematochezia, dysuria, hematuria, rash, seizure activity, orthopnea, PND, pedal edema, claudication. Remaining systems are negative.  Physical Exam: Well-developed well-nourished in no acute distress.  Skin is warm and dry.  HEENT is normal.  Neck is supple.  Chest is clear to auscultation with normal expansion.  Cardiovascular exam is regular rate and rhythm.  Abdominal exam nontender or distended. No masses palpated. Extremities show no edema. neuro grossly intact

## 2015-07-21 LAB — HEPATIC FUNCTION PANEL
ALT: 17 U/L (ref 9–46)
AST: 18 U/L (ref 10–35)
Albumin: 4 g/dL (ref 3.6–5.1)
Alkaline Phosphatase: 50 U/L (ref 40–115)
Bilirubin, Direct: 0.1 mg/dL (ref <=0.2)
Indirect Bilirubin: 0.2 mg/dL (ref 0.2–1.2)
Total Bilirubin: 0.3 mg/dL (ref 0.2–1.2)
Total Protein: 6.9 g/dL (ref 6.1–8.1)

## 2015-07-21 LAB — LIPID PANEL
Cholesterol: 148 mg/dL (ref 125–200)
HDL: 34 mg/dL — ABNORMAL LOW (ref >=40)
LDL Cholesterol: 47 mg/dL (ref <130)
Total CHOL/HDL Ratio: 4.4 Ratio (ref <=5.0)
Triglycerides: 334 mg/dL — ABNORMAL HIGH (ref <150)
VLDL: 67 mg/dL — ABNORMAL HIGH (ref <30)

## 2015-07-24 ENCOUNTER — Encounter: Payer: Self-pay | Admitting: Cardiology

## 2015-07-24 ENCOUNTER — Ambulatory Visit (INDEPENDENT_AMBULATORY_CARE_PROVIDER_SITE_OTHER): Payer: Medicare Other | Admitting: Cardiology

## 2015-07-24 VITALS — BP 148/70 | HR 63 | Ht 69.5 in | Wt 213.9 lb

## 2015-07-24 DIAGNOSIS — I10 Essential (primary) hypertension: Secondary | ICD-10-CM | POA: Diagnosis not present

## 2015-07-24 DIAGNOSIS — I2510 Atherosclerotic heart disease of native coronary artery without angina pectoris: Secondary | ICD-10-CM

## 2015-07-24 DIAGNOSIS — I679 Cerebrovascular disease, unspecified: Secondary | ICD-10-CM | POA: Diagnosis not present

## 2015-07-24 DIAGNOSIS — E785 Hyperlipidemia, unspecified: Secondary | ICD-10-CM

## 2015-07-24 NOTE — Assessment & Plan Note (Signed)
Continue aspirin. Intolerant to statins. Recent nuclear study reviewed in depth. Patient has apical thinning but no ischemia and I consider this normal. No symptoms. No further evaluation.

## 2015-07-24 NOTE — Assessment & Plan Note (Signed)
Intolerant to statins. Continue zetia.  

## 2015-07-24 NOTE — Patient Instructions (Signed)
Your physician wants you to follow-up in: 6 MONTHS WITH DR CRENSHAW You will receive a reminder letter in the mail two months in advance. If you don't receive a letter, please call our office to schedule the follow-up appointment.   If you need a refill on your cardiac medications before your next appointment, please call your pharmacy.  

## 2015-07-24 NOTE — Assessment & Plan Note (Signed)
Follow-up carotid Dopplers December 2017. 

## 2015-07-24 NOTE — Assessment & Plan Note (Signed)
Blood pressure borderline. We will follow this and adjust regimen as needed. He did have orthostatic symptoms last fall.

## 2015-08-09 ENCOUNTER — Ambulatory Visit (INDEPENDENT_AMBULATORY_CARE_PROVIDER_SITE_OTHER): Payer: Medicare Other | Admitting: Ophthalmology

## 2015-08-09 DIAGNOSIS — E113293 Type 2 diabetes mellitus with mild nonproliferative diabetic retinopathy without macular edema, bilateral: Secondary | ICD-10-CM

## 2015-08-09 DIAGNOSIS — H353134 Nonexudative age-related macular degeneration, bilateral, advanced atrophic with subfoveal involvement: Secondary | ICD-10-CM

## 2015-08-09 DIAGNOSIS — I10 Essential (primary) hypertension: Secondary | ICD-10-CM

## 2015-08-09 DIAGNOSIS — H43813 Vitreous degeneration, bilateral: Secondary | ICD-10-CM | POA: Diagnosis not present

## 2015-08-09 DIAGNOSIS — H35033 Hypertensive retinopathy, bilateral: Secondary | ICD-10-CM

## 2015-08-09 DIAGNOSIS — E11319 Type 2 diabetes mellitus with unspecified diabetic retinopathy without macular edema: Secondary | ICD-10-CM | POA: Diagnosis not present

## 2015-08-09 LAB — HM DIABETES EYE EXAM

## 2015-08-10 DIAGNOSIS — C61 Malignant neoplasm of prostate: Secondary | ICD-10-CM | POA: Diagnosis not present

## 2015-08-14 DIAGNOSIS — E291 Testicular hypofunction: Secondary | ICD-10-CM | POA: Diagnosis not present

## 2015-08-14 DIAGNOSIS — C61 Malignant neoplasm of prostate: Secondary | ICD-10-CM | POA: Diagnosis not present

## 2015-08-14 DIAGNOSIS — Z0000 Encounter for general adult medical examination without abnormal findings: Secondary | ICD-10-CM | POA: Diagnosis not present

## 2015-08-18 ENCOUNTER — Other Ambulatory Visit: Payer: Self-pay | Admitting: Family Medicine

## 2015-08-21 ENCOUNTER — Other Ambulatory Visit: Payer: Self-pay | Admitting: *Deleted

## 2015-08-21 MED ORDER — GLIPIZIDE 10 MG PO TABS: ORAL_TABLET | ORAL | Status: DC

## 2015-08-23 DIAGNOSIS — E119 Type 2 diabetes mellitus without complications: Secondary | ICD-10-CM | POA: Diagnosis not present

## 2015-09-04 ENCOUNTER — Other Ambulatory Visit: Payer: Self-pay | Admitting: Family Medicine

## 2015-09-11 ENCOUNTER — Ambulatory Visit (INDEPENDENT_AMBULATORY_CARE_PROVIDER_SITE_OTHER): Payer: Medicare Other | Admitting: Family Medicine

## 2015-09-11 ENCOUNTER — Encounter: Payer: Self-pay | Admitting: Family Medicine

## 2015-09-11 VITALS — BP 137/55 | HR 65 | Wt 219.0 lb

## 2015-09-11 DIAGNOSIS — R635 Abnormal weight gain: Secondary | ICD-10-CM

## 2015-09-11 DIAGNOSIS — E1151 Type 2 diabetes mellitus with diabetic peripheral angiopathy without gangrene: Secondary | ICD-10-CM | POA: Diagnosis not present

## 2015-09-11 DIAGNOSIS — I10 Essential (primary) hypertension: Secondary | ICD-10-CM

## 2015-09-11 DIAGNOSIS — C61 Malignant neoplasm of prostate: Secondary | ICD-10-CM

## 2015-09-11 LAB — POCT GLYCOSYLATED HEMOGLOBIN (HGB A1C): Hemoglobin A1C: 6.9

## 2015-09-11 MED ORDER — INSULIN GLARGINE 300 UNIT/ML ~~LOC~~ SOPN: 25.0000 [IU] | PEN_INJECTOR | Freq: Every day | SUBCUTANEOUS | Status: DC

## 2015-09-11 NOTE — Progress Notes (Signed)
   Subjective:    Patient ID: Clayton Harrison, male    DOB: 11-03-1930, 82 y.o.   MRN: [id number]  HPI Diabetes - no hypoglycemic events. No wounds or sores that are not healing well. No increased thirst or urination. Checking glucose at home. Taking medications as prescribed without any side effects.  Hypertension- Pt denies chest pain, SOB, dizziness, or heart palpitations.  Taking meds as directed w/o problems.  Denies medication side effects.  He is on ACE and ARB.    Prostate cancer-ever since he completed his treatments he has been overly hungry and craving sweets. He's been eating ice cream etc. He's actually gained his weight back.   Review of Systems     Objective:   Physical Exam  Constitutional: He is oriented to person, place, and time. He appears well-developed and well-nourished.  HENT:  Head: Normocephalic and atraumatic.  Cardiovascular: Normal rate, regular rhythm and normal heart sounds.   Pulmonary/Chest: Effort normal and breath sounds normal.  Neurological: He is alert and oriented to person, place, and time.  Skin: Skin is warm and dry.  Psychiatric: He has a normal mood and affect. His behavior is normal.          Assessment & Plan:  DM- Stable.  A1C of 6.9 today. On ACE, zetia ( since intolerant to statins).  About cutting out sweets especially at night. Really working on diet and exercise and getting his weight back under control.  Reminded him about eye exam.   HTN - On ACEi.  Well controlled. Continue current regimen. Follow up in 3 mo.   Abnormal weight gain/BMI 31-continue work on diet and exercise. Discussed the his glipizide consult could also be increasing his appetite and causing some weight gain. Certainly we could look at changing that but it would probably be replaced by more expensive medication. He wants to work on diet and exercise first.  Prostate cancer-can pleated cancer treatment. Still being followed by oncology.

## 2015-09-11 NOTE — Patient Instructions (Signed)
Increase Toujeo to 25 units

## 2015-09-16 ENCOUNTER — Other Ambulatory Visit: Payer: Self-pay | Admitting: Family Medicine

## 2015-10-01 ENCOUNTER — Other Ambulatory Visit: Payer: Self-pay | Admitting: Family Medicine

## 2015-11-16 ENCOUNTER — Ambulatory Visit (INDEPENDENT_AMBULATORY_CARE_PROVIDER_SITE_OTHER): Payer: Medicare Other | Admitting: Sports Medicine

## 2015-11-16 ENCOUNTER — Ambulatory Visit (INDEPENDENT_AMBULATORY_CARE_PROVIDER_SITE_OTHER): Payer: Medicare Other

## 2015-11-16 ENCOUNTER — Encounter: Payer: Self-pay | Admitting: Sports Medicine

## 2015-11-16 VITALS — BP 133/68 | HR 67 | Resp 18 | Wt 220.9 lb

## 2015-11-16 DIAGNOSIS — G5601 Carpal tunnel syndrome, right upper limb: Secondary | ICD-10-CM

## 2015-11-16 DIAGNOSIS — M19031 Primary osteoarthritis, right wrist: Secondary | ICD-10-CM

## 2015-11-16 DIAGNOSIS — M25531 Pain in right wrist: Secondary | ICD-10-CM | POA: Diagnosis not present

## 2015-11-16 MED ORDER — MELOXICAM 15 MG PO TABS: ORAL_TABLET | ORAL | Status: DC

## 2015-11-16 NOTE — Progress Notes (Signed)
   Subjective:    I'm seeing this patient as a consultation for:  Dr. [name]  CC: Right wrist pain  HPI: This is a pleasant 82 year old male, he has pain that he localizes both on the dorsum of his right wrist as well as on the volar aspect with radiation and numbness and tingling into his hands and fingers. Pain is significantly worse at night.  Moderate, persistent, has done nighttime splinting for months now without improvement.  Past medical history, Surgical history, Family history not pertinant except as noted below, Social history, Allergies, and medications have been entered into the medical record, reviewed, and no changes needed.   Review of Systems: No headache, visual changes, nausea, vomiting, diarrhea, constipation, dizziness, abdominal pain, skin rash, fevers, chills, night sweats, weight loss, swollen lymph nodes, body aches, joint swelling, muscle aches, chest pain, shortness of breath, mood changes, visual or auditory hallucinations.   Objective:   General: Well Developed, well nourished, and in no acute distress.  Neuro/Psych: Alert and oriented x3, extra-ocular muscles intact, able to move all 4 extremities, sensation grossly intact. Skin: Warm and dry, no rashes noted.  Respiratory: Not using accessory muscles, speaking in full sentences, trachea midline.  Cardiovascular: Pulses palpable, no extremity edema. Abdomen: Does not appear distended. Right Wrist: Inspection normal with no visible erythema or swelling. ROM smooth and normal with good flexion and extension and ulnar/radial deviation that is symmetrical with opposite wrist. Tender to palpation over the radiocarpal joint No snuffbox tenderness. No tenderness over Canal of Guyon. Strength 5/5 in all directions without pain. Negative Finkelstein, positive Tinel's and Phalen signs Negative Watson's test.  Procedure: Real-time Ultrasound Guided Injection of right radiocarpal joint Device: GE Logiq E    Verbal informed consent obtained.  Time-out conducted.  Noted no overlying erythema, induration, or other signs of local infection.  Skin prepped in a sterile fashion.  Local anesthesia: Topical Ethyl chloride.  With sterile technique and under real time ultrasound guidance:  25-gauge needle advanced into joint and 1 mL lidocaine, 1 mL Marcaine, 1 mL kenalog 40 injected easily. Completed without difficulty  Pain immediately resolved suggesting accurate placement of the medication.  Advised to call if fevers/chills, erythema, induration, drainage, or persistent bleeding.  Images permanently stored and available for review in the ultrasound unit.  Impression: Technically successful ultrasound guided injection.  Procedure: Real-time Ultrasound Guided Injection of right median nerve hydrodissection Device: GE Logiq E  Verbal informed consent obtained.  Time-out conducted.  Noted no overlying erythema, induration, or other signs of local infection.  Skin prepped in a sterile fashion.  Local anesthesia: Topical Ethyl chloride.  With sterile technique and under real time ultrasound guidance:  25-gauge needle advanced into the carpal tunnel, taking care to avoid intraneural injection I injected medication both superficial to and deep to the median nerve free from surrounding structures, needle was then redirected deep into the carpal tunnel and further medication was injected for a total of 1 mL kenalog 40, 5 mL lidocaine. Completed without difficulty  Pain immediately resolved suggesting accurate placement of the medication.  Advised to call if fevers/chills, erythema, induration, drainage, or persistent bleeding.  Images permanently stored and available for review in the ultrasound unit.  Impression: Technically successful ultrasound guided injection.  Impression and Recommendations:   This case required medical decision making of moderate complexity.

## 2015-11-16 NOTE — Assessment & Plan Note (Signed)
X-rays, wrist joint injection, meloxicam.  return in one month.

## 2015-11-16 NOTE — Assessment & Plan Note (Signed)
Failed months of nighttime splinting. Median nerve hydrodissection. Return in 1 month.

## 2015-11-30 ENCOUNTER — Other Ambulatory Visit: Payer: Self-pay | Admitting: Family Medicine

## 2015-12-01 ENCOUNTER — Other Ambulatory Visit: Payer: Self-pay | Admitting: Family Medicine

## 2015-12-06 ENCOUNTER — Other Ambulatory Visit: Payer: Self-pay | Admitting: Family Medicine

## 2015-12-12 ENCOUNTER — Ambulatory Visit: Payer: Medicare Other | Admitting: Family Medicine

## 2015-12-19 ENCOUNTER — Encounter: Payer: Self-pay | Admitting: Family Medicine

## 2015-12-19 ENCOUNTER — Ambulatory Visit: Payer: Medicare Other | Admitting: Sports Medicine

## 2015-12-19 ENCOUNTER — Ambulatory Visit (INDEPENDENT_AMBULATORY_CARE_PROVIDER_SITE_OTHER): Payer: Medicare Other | Admitting: Family Medicine

## 2015-12-19 VITALS — BP 149/71 | HR 64 | Wt 216.0 lb

## 2015-12-19 DIAGNOSIS — I10 Essential (primary) hypertension: Secondary | ICD-10-CM | POA: Diagnosis not present

## 2015-12-19 DIAGNOSIS — E1151 Type 2 diabetes mellitus with diabetic peripheral angiopathy without gangrene: Secondary | ICD-10-CM

## 2015-12-19 DIAGNOSIS — K529 Noninfective gastroenteritis and colitis, unspecified: Secondary | ICD-10-CM

## 2015-12-19 DIAGNOSIS — R42 Dizziness and giddiness: Secondary | ICD-10-CM

## 2015-12-19 DIAGNOSIS — I951 Orthostatic hypotension: Secondary | ICD-10-CM

## 2015-12-19 LAB — POCT GLYCOSYLATED HEMOGLOBIN (HGB A1C): Hemoglobin A1C: 7.9

## 2015-12-19 MED ORDER — INSULIN GLARGINE 300 UNIT/ML ~~LOC~~ SOPN: 28.0000 [IU] | PEN_INJECTOR | Freq: Every day | SUBCUTANEOUS | Status: DC

## 2015-12-19 MED ORDER — AMLODIPINE BESYLATE 10 MG PO TABS: ORAL_TABLET | ORAL | Status: DC

## 2015-12-19 NOTE — Patient Instructions (Addendum)
Increased Toujeo to 28 units. Continue work on regular exercise and healthy diet. Work on increasing vegetable intake to feel full and less hungry. Can try Restora for your diarrhea

## 2015-12-19 NOTE — Progress Notes (Signed)
Subjective:    CC: DM  HPI: Diabetes - no hypoglycemic events. No wounds or sores that are not healing well. No increased thirst or urination. Checking glucose at home. Taking medications as prescribed without any side effects. Lab Results  Component Value Date   HGBA1C 6.9 09/11/2015   Hypertension- Pt denies chest pain, SOB, dizziness, or heart palpitations.  Taking meds as directed w/o problems.  Denies medication side effects.    He's also had some intermittent dizziness. It seems to happen more so when he is changing position. For example happened when he arrived here and tried to get out of his car. He says it doesn't happen with just changing the movement of his head. It doesn't happen daily but it does feel like it's been happening more frequently recently.   He also completes a chronic recurrent diarrhea that seems to be triggered when he eats. He can have up to 3 bowel movements a day. Usually soft and running and sometimes even watery. He says it's been going on ever since he had his colon surgery. He denies any infectious symptoms such as fever or abdominal pain or chills.  Past medical history, Surgical history, Family history not pertinant except as noted below, Social history, Allergies, and medications have been entered into the medical record, reviewed, and corrections made.   Review of Systems: No fevers, chills, night sweats, weight loss, chest pain, or shortness of breath.   Objective:    General: Well Developed, well nourished, and in no acute distress.  Neuro: Alert and oriented x3, extra-ocular muscles intact, sensation grossly intact.  HEENT: Normocephalic, atraumatic, TMs and canals are clear bilaterally. Oropharynx is clear as well. No thyromegaly. No cervical lymphadenopathy. Skin: Warm and dry, no rashes. Cardiac: Regular rate and rhythm, no murmurs rubs or gallops, no lower extremity edema.  Respiratory: Clear to auscultation bilaterally. Not using accessory  muscles, speaking in full sentences. Neuro: Negative Dix-Hallpike maneuver.  Normal gait.  Cranial nerves II through XII intact.   Impression and Recommendations:    DM- Uncontrolled. Hemoglobin A1c of 7.9 today. He admits he's been eating a lot. He says his appetite has just really increased. We discussed strategies to get back on track with diet and exercise. In the interim we'll go ahead and increase insulin to 28 units. Continue current regimen. Not on a statin due to her intolerance. He is on an ACE inhibitor.  HTN - Well controlled. Continue current regimen. Follow up in 3-4 months.  Dizziness - negative Dix-Hallpike maneuver so unlikely to be benign positional vertigo. Most likely consistent with orthostasis. I explained to him how when we age there is less compliance of the blood vessels and sometimes a delay in contraction of vasculature to help maintain blood pressure with change in position. Encouraged him to take his time when he is going from sitting to standing or lying down to sitting etc.  Chronic diarrhea-recommend a trial of Restora which is a probiotic. Avoid artificial sweeteners.

## 2016-01-01 ENCOUNTER — Other Ambulatory Visit: Payer: Self-pay | Admitting: Family Medicine

## 2016-01-17 ENCOUNTER — Telehealth: Payer: Self-pay | Admitting: Family Medicine

## 2016-01-17 NOTE — Telephone Encounter (Signed)
Call pt: I reviewed  The formulary book he brought.  Unfortunately there really isn't going to be anything cheaper as far as insulin. They are all exactly the same tear. And as far as the pills the to that he is taking are the cheapest. Is really not a lot of great options on the newer medicines they're all tier 3 which is differently a more expensive co-pay. There are not any tear to drugs on his formulary for diabetes. He can come by to pick up his book at any time

## 2016-01-17 NOTE — Telephone Encounter (Signed)
Called and informed pt recommendations.Clayton Harrison [place]

## 2016-01-29 ENCOUNTER — Other Ambulatory Visit: Payer: Self-pay | Admitting: Family Medicine

## 2016-02-05 DIAGNOSIS — C61 Malignant neoplasm of prostate: Secondary | ICD-10-CM | POA: Diagnosis not present

## 2016-02-05 LAB — TESTOSTERONE: Testosterone, total: 161.7

## 2016-02-21 DIAGNOSIS — E291 Testicular hypofunction: Secondary | ICD-10-CM | POA: Diagnosis not present

## 2016-02-21 DIAGNOSIS — Z87442 Personal history of urinary calculi: Secondary | ICD-10-CM | POA: Diagnosis not present

## 2016-02-22 IMAGING — CR DG SACRUM/COCCYX 2+V
3 series · 3 of 3 positions shown · non-contrast
Comparison: Pelvic CT 03/25/2014.  Whole body bone scan 03/25/2014.

CLINICAL DATA: Tenderness over the lumbar spine and upper sacrum
after lifting injury 3 weeks ago. Initial encounter.

EXAM:
SACRUM AND COCCYX - 2+ VIEW

[sacrum ap]
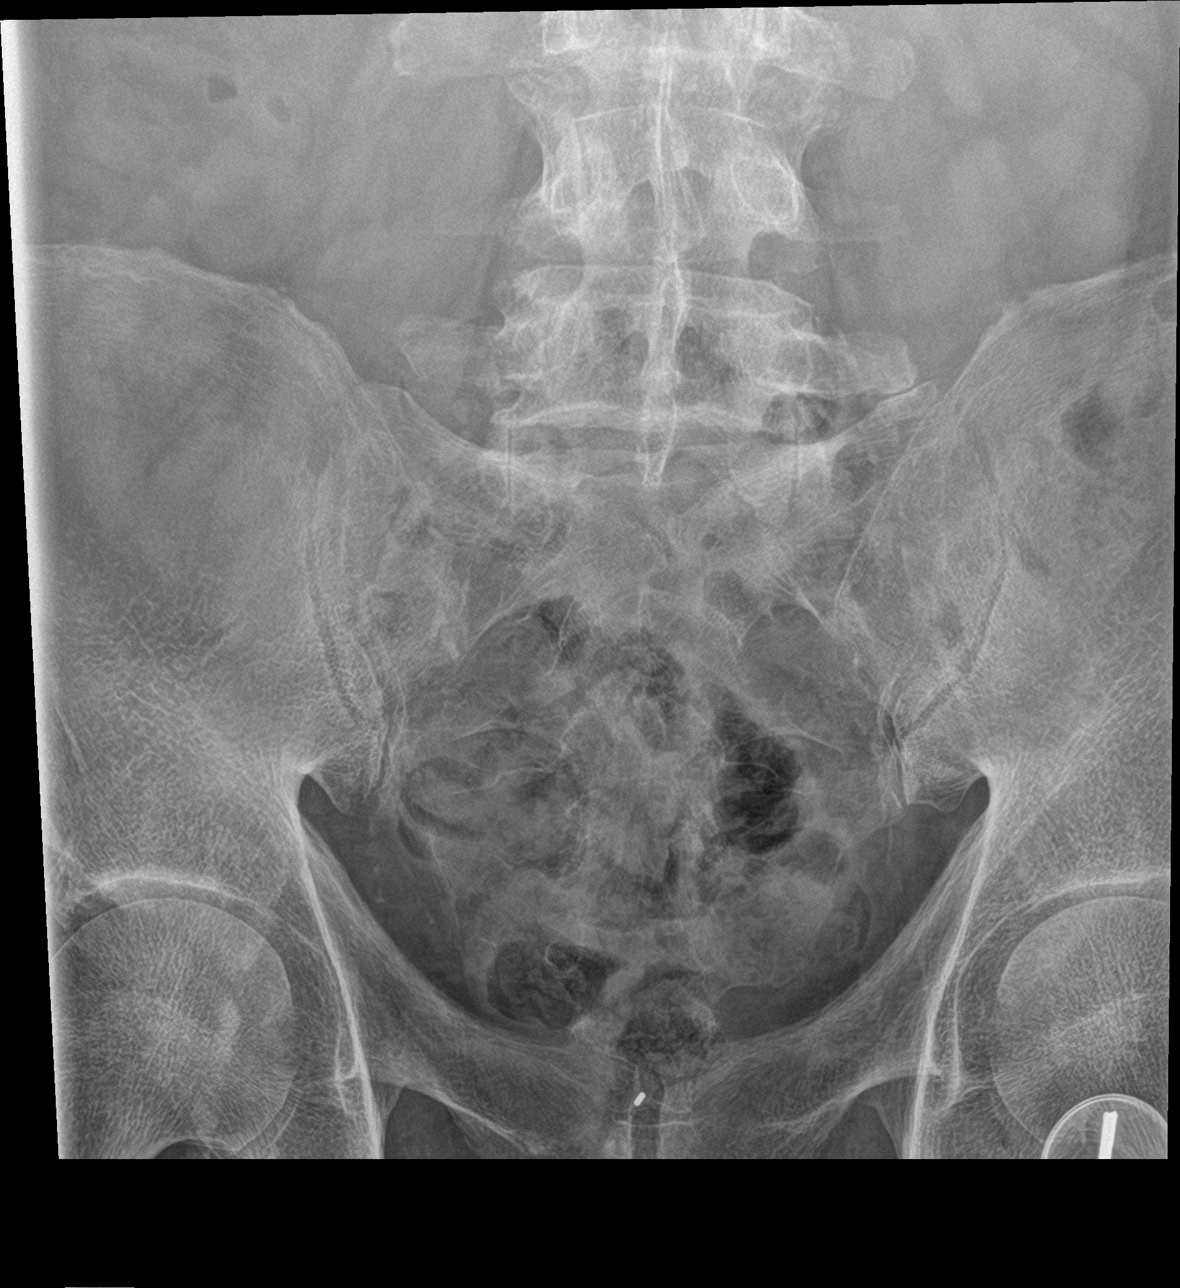

[coccyx ap]
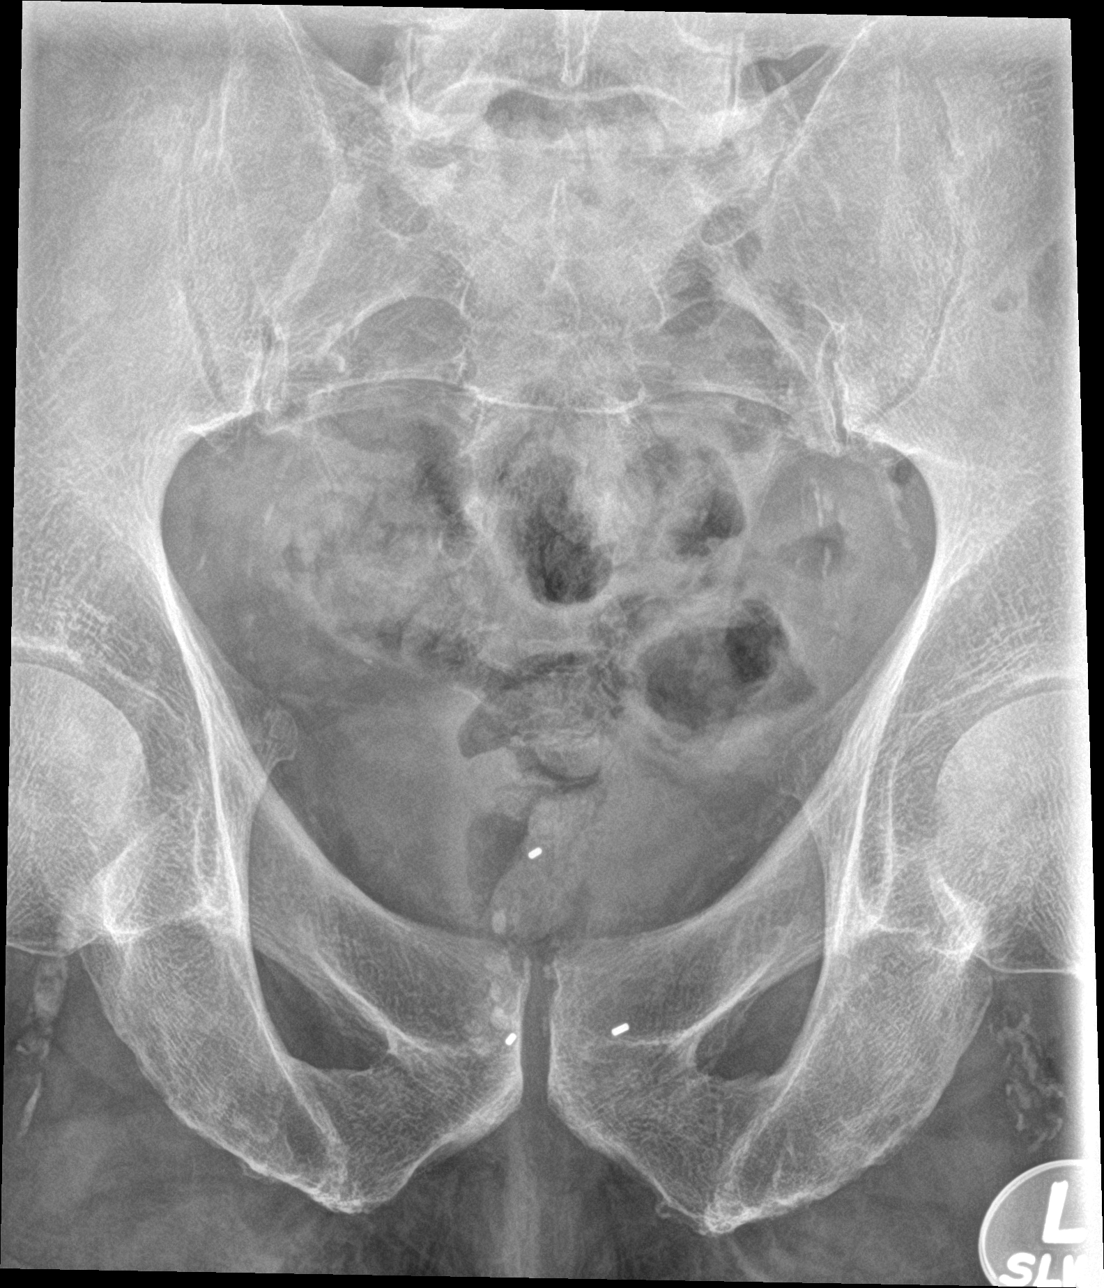

[sacrum lat]
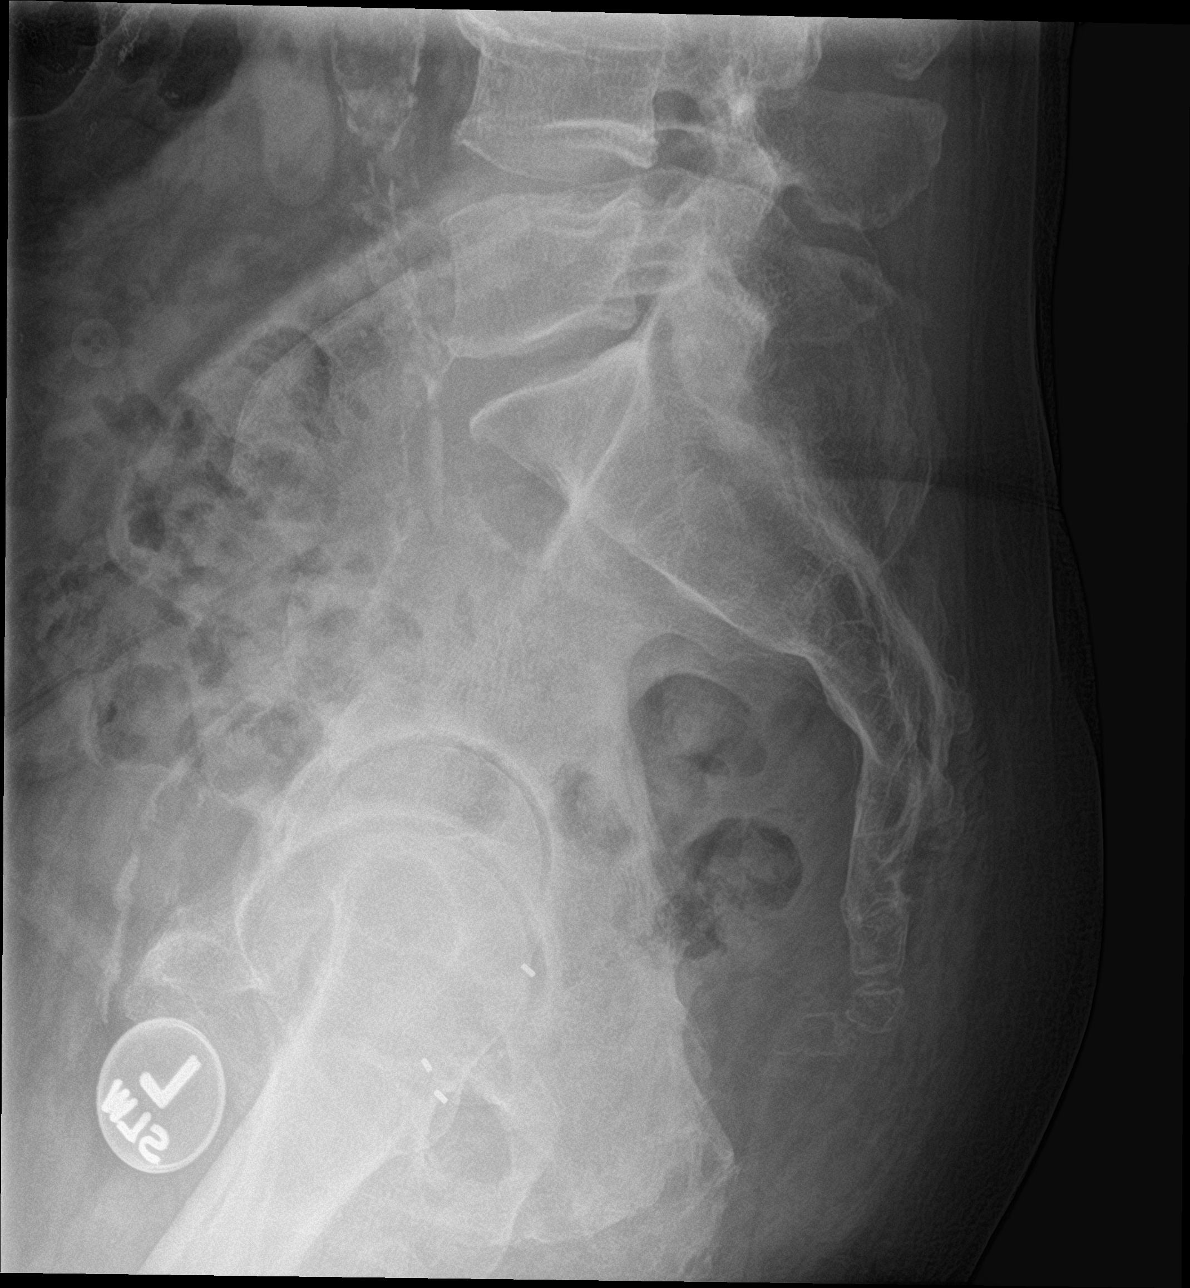

[3 of 3 positions shown; findings below may reference images not displayed]

FINDINGS: The bones are demineralized. There is no evidence of acute fracture
or dislocation. The sacrum has a stable appearance. Mild
degenerative changes appear stable. Moderate atherosclerosis noted.
IMPRESSION: No acute osseous findings.

## 2016-02-22 IMAGING — CR DG LUMBAR SPINE 2-3V
3 series · 3 of 3 positions shown · non-contrast
Comparison: 08/07/2013

CLINICAL DATA: Tenderness over lumbar spine and upper sacrum after
heavy lifting 3 weeks ago.

EXAM:
LUMBAR SPINE - 2-3 VIEW

[l-spine ap]
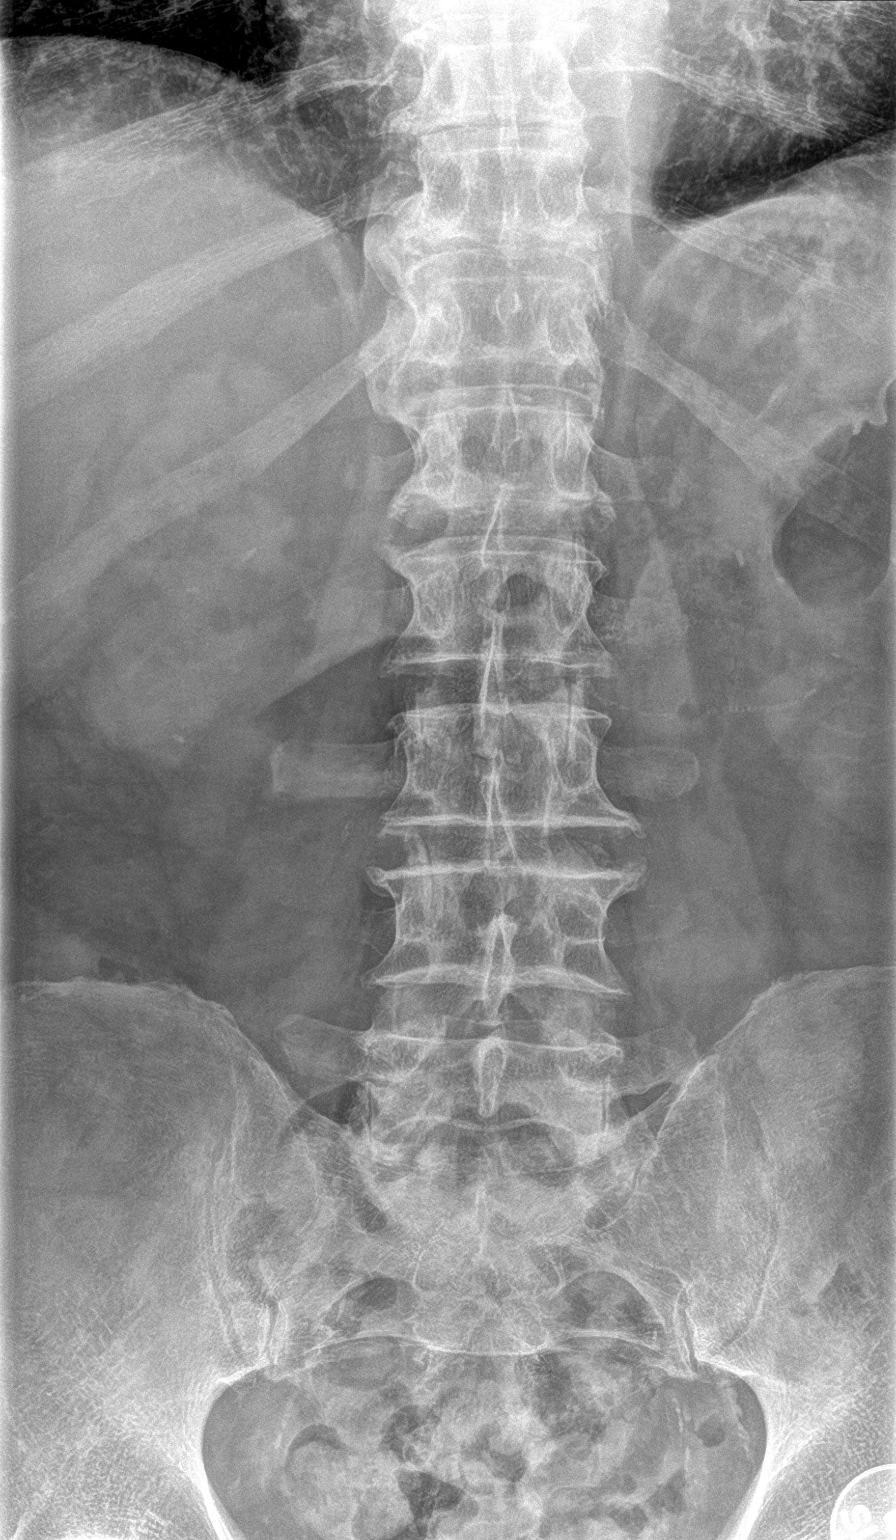

[l-spine lat]
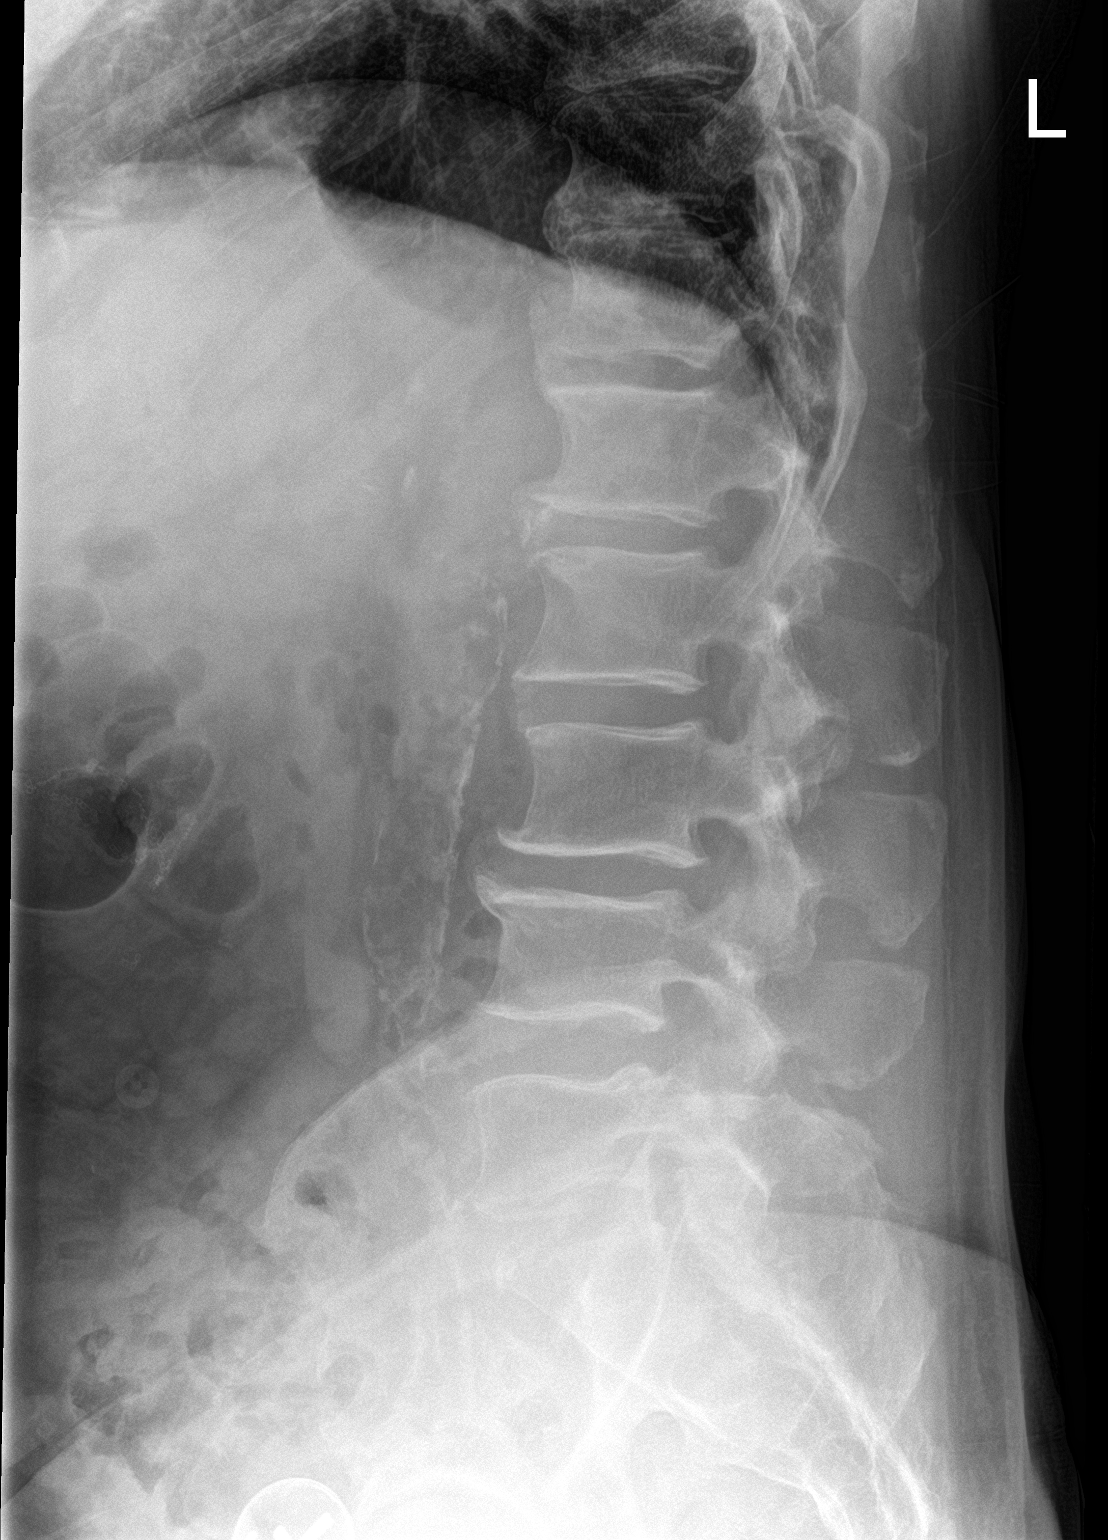

[l-spine spot]
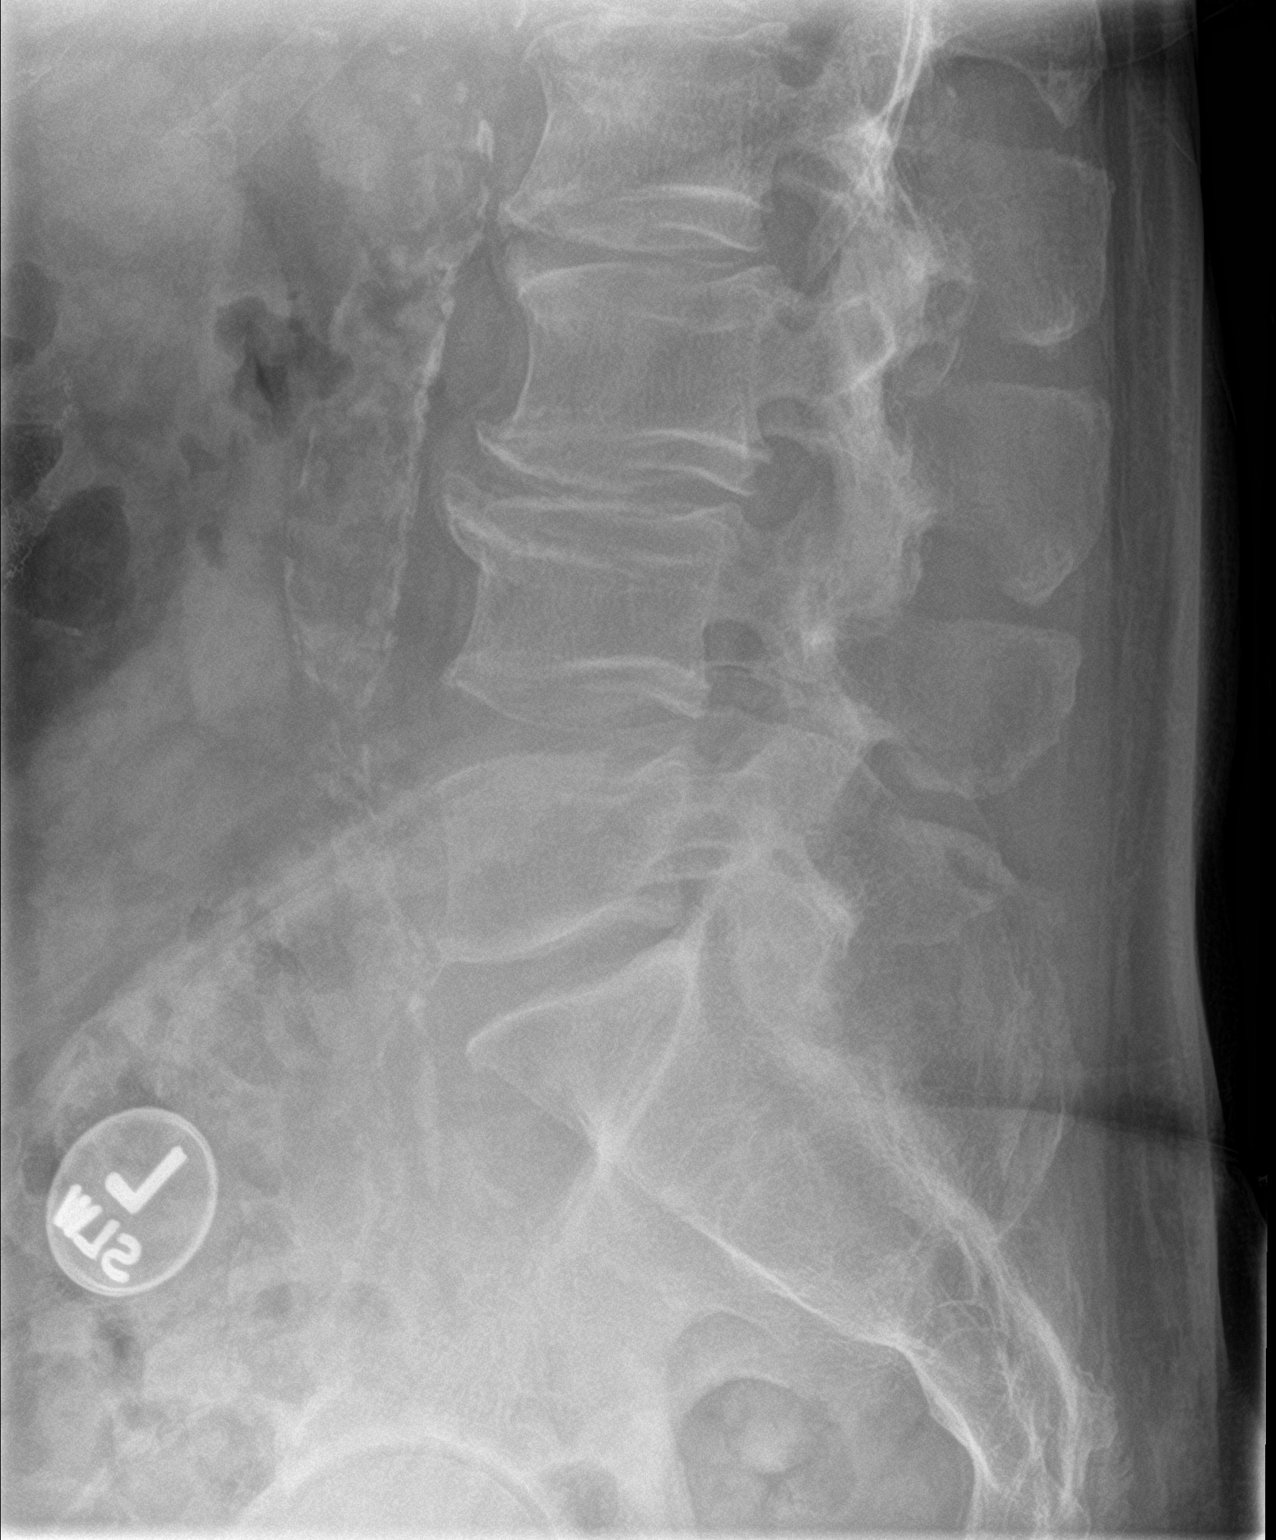

[3 of 3 positions shown; findings below may reference images not displayed]

FINDINGS: There is loss of lumbar lordosis. Large anterior osteophytes are
identified at numerous levels. There is no evidence for acute
fracture or traumatic subluxation. No suspicious lytic or blastic
lesions are identified.

Bowel gas pattern is nonobstructive. Bowel sutures are identified
within the left upper quadrant and right upper quadrant.
IMPRESSION: 1.  No evidence for acute  abnormality.
2. Loss of lordosis.

## 2016-02-27 DIAGNOSIS — E119 Type 2 diabetes mellitus without complications: Secondary | ICD-10-CM | POA: Diagnosis not present

## 2016-03-04 ENCOUNTER — Other Ambulatory Visit: Payer: Self-pay | Admitting: Family Medicine

## 2016-03-16 ENCOUNTER — Other Ambulatory Visit: Payer: Self-pay | Admitting: Family Medicine

## 2016-03-21 ENCOUNTER — Ambulatory Visit: Payer: Medicare Other | Admitting: Family Medicine

## 2016-03-25 ENCOUNTER — Encounter: Payer: Self-pay | Admitting: Family Medicine

## 2016-03-25 ENCOUNTER — Ambulatory Visit (INDEPENDENT_AMBULATORY_CARE_PROVIDER_SITE_OTHER): Payer: Medicare Other | Admitting: Family Medicine

## 2016-03-25 ENCOUNTER — Ambulatory Visit (INDEPENDENT_AMBULATORY_CARE_PROVIDER_SITE_OTHER): Payer: Medicare Other

## 2016-03-25 VITALS — BP 139/50 | HR 71 | Wt 224.0 lb

## 2016-03-25 DIAGNOSIS — M479 Spondylosis, unspecified: Secondary | ICD-10-CM

## 2016-03-25 DIAGNOSIS — M5417 Radiculopathy, lumbosacral region: Secondary | ICD-10-CM | POA: Diagnosis not present

## 2016-03-25 DIAGNOSIS — M545 Low back pain: Secondary | ICD-10-CM | POA: Diagnosis not present

## 2016-03-25 DIAGNOSIS — M546 Pain in thoracic spine: Secondary | ICD-10-CM | POA: Diagnosis not present

## 2016-03-25 DIAGNOSIS — M47814 Spondylosis without myelopathy or radiculopathy, thoracic region: Secondary | ICD-10-CM | POA: Diagnosis not present

## 2016-03-25 DIAGNOSIS — M5416 Radiculopathy, lumbar region: Secondary | ICD-10-CM

## 2016-03-25 MED ORDER — METOPROLOL TARTRATE 50 MG PO TABS: 50.0000 mg | ORAL_TABLET | Freq: Two times a day (BID) | ORAL | 1 refills | Status: DC

## 2016-03-25 MED ORDER — TRAMADOL HCL 50 MG PO TABS: 50.0000 mg | ORAL_TABLET | Freq: Three times a day (TID) | ORAL | 0 refills | Status: DC | PRN

## 2016-03-25 MED ORDER — AMLODIPINE BESYLATE 10 MG PO TABS: 10.0000 mg | ORAL_TABLET | Freq: Every day | ORAL | 1 refills | Status: DC

## 2016-03-25 MED ORDER — LISINOPRIL 20 MG PO TABS: 20.0000 mg | ORAL_TABLET | Freq: Every day | ORAL | 1 refills | Status: DC

## 2016-03-25 NOTE — Progress Notes (Addendum)
   Subjective:    Patient ID: Clayton Harrison, male    DOB: [birth year], 82 y.o.   MRN: [id number]  HPI Has he had more back pain for the last 2-3 months after his radiation therapy for his prostate.  No CP or SOB. Even when even walk to his mailbox and back. He has to stop and rest once back in the house. He will usually lean over a chair for relief.    He also c/o back pain.  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  He also have several bulging discs. He has been using Salon pas and some OTC topicals for pain.  He also uses Tylenol. He'll usually take 2 in the morning and 2 in the afternoon or at bedtime. Usually just a total of 4 per day and some days less than that. He says the morning dose only seems to last about a half an hour. Takes it at bedtime he does feel like he sleeps better. His back does seem to feel better if he is leaning forward or lying flat. He also gets pain that radiates down both outer thighs. He says sometimes it goes all way down his legs to the point where his toes actually feel numb. Even his feet will feel like they're swollen at times even though they don't look swollen.   Review of Systems     Objective:   Physical Exam  Constitutional: He is oriented to person, place, and time. He appears well-developed and well-nourished.  HENT:  Head: Normocephalic and atraumatic.  Eyes: Conjunctivae and EOM are normal.  Cardiovascular: Normal rate.   Pulmonary/Chest: Effort normal.  Musculoskeletal:       Back:  Normal lumbar flexion though he did have a lot of pain with flexion. He also had pain with extension. Normal rotation right and left without significant discomfort that he had a little bit of a pulling sensation in both sides. He actually has some swelling over the upper lumbar and lower thoracic spine. Hip, knee and ankle strength is 5/5 bilat.  Patellar reflexes 1+ bilat.   Neurological: He is alert and oriented to person, place,  and time.  Skin: Skin is dry. No pallor.  Psychiatric: He has a normal mood and affect. His behavior is normal.  Vitals reviewed.       Assessment & Plan:  Lumbar spine pain-we'll go ahead and get x-ray today to rule out compression fracture etc. Given tramadol for pain. Continue Tylenol but increased to 2 tabs 3 times a day for maximum of 300 mg. Call if suddenly worse or not improving. We'll likely need to get a repeat MRI. Also consider peripheral neuropathy.

## 2016-03-26 ENCOUNTER — Encounter: Payer: Self-pay | Admitting: Family Medicine

## 2016-03-26 DIAGNOSIS — I700 Atherosclerosis of aorta: Secondary | ICD-10-CM | POA: Insufficient documentation

## 2016-03-29 ENCOUNTER — Ambulatory Visit
Admission: RE | Admit: 2016-03-29 | Discharge: 2016-03-29 | Disposition: A | Discharge status: Home / Self Care | Payer: Medicare Other | Source: Ambulatory Visit | Attending: Family Medicine | Admitting: Family Medicine

## 2016-03-29 DIAGNOSIS — M5416 Radiculopathy, lumbar region: Secondary | ICD-10-CM

## 2016-03-29 DIAGNOSIS — M48061 Spinal stenosis, lumbar region without neurogenic claudication: Secondary | ICD-10-CM | POA: Diagnosis not present

## 2016-04-02 ENCOUNTER — Encounter: Payer: Self-pay | Admitting: Family Medicine

## 2016-04-02 ENCOUNTER — Ambulatory Visit (INDEPENDENT_AMBULATORY_CARE_PROVIDER_SITE_OTHER): Payer: Medicare Other | Admitting: Family Medicine

## 2016-04-02 VITALS — BP 135/50 | HR 63 | Wt 222.0 lb

## 2016-04-02 DIAGNOSIS — E1151 Type 2 diabetes mellitus with diabetic peripheral angiopathy without gangrene: Secondary | ICD-10-CM | POA: Diagnosis not present

## 2016-04-02 DIAGNOSIS — I10 Essential (primary) hypertension: Secondary | ICD-10-CM | POA: Diagnosis not present

## 2016-04-02 DIAGNOSIS — M48062 Spinal stenosis, lumbar region with neurogenic claudication: Secondary | ICD-10-CM | POA: Diagnosis not present

## 2016-04-02 DIAGNOSIS — Z23 Encounter for immunization: Secondary | ICD-10-CM | POA: Diagnosis not present

## 2016-04-02 LAB — POCT GLYCOSYLATED HEMOGLOBIN (HGB A1C): Hemoglobin A1C: 6.6

## 2016-04-02 NOTE — Progress Notes (Signed)
Subjective:    CC: DM, HTN  HPI: Diabetes - no hypoglycemic events. No wounds or sores that are not healing well. No increased thirst or urination. Checking glucose at home. Taking medications as prescribed without any side effects.  Sugars   Hypertension- Pt denies chest pain, SOB, dizziness, or heart palpitations.  Taking meds as directed w/o problems.  Denies medication side effects.    Low back pain with radiculopathy-we did do an MRI and he does have some worsening of some of the disc disease at infection causing some nerve compression. We are to try to call him yesterday and leave a message recently having palms with his phone. Really affected his mobility and is affecting his daily quality of life.  Past medical history, Surgical history, Family history not pertinant except as noted below, Social history, Allergies, and medications have been entered into the medical record, reviewed, and corrections made.   Review of Systems: No fevers, chills, night sweats, weight loss, chest pain, or shortness of breath.   Objective:    General: Well Developed, well nourished, and in no acute distress.  Neuro: Alert and oriented x3, extra-ocular muscles intact, sensation grossly intact.  HEENT: Normocephalic, atraumatic  Skin: Warm and dry, no rashes. Cardiac: Regular rate and rhythm, no murmurs rubs or gallops, no lower extremity edema.  Respiratory: Clear to auscultation bilaterally. Not using accessory muscles, speaking in full sentences.   Impression and Recommendations:   DM - Well controlled. Continue current regimen. Follow up in  3-4 months.  Lab Results  Component Value Date   HGBA1C 6.6 04/02/2016   HTN - Well controlled. Continue current regimen.  Lumbar stenosis with degenerative disc disease and nerve impingement-I would like him to get in with Dr. Dianah Field he has seen previously for his hand issues. Discussed possible injections for treatment. He is not interested in  surgical correction at this time. We discussed the quality of life is what's most important at this point.

## 2016-04-03 LAB — BASIC METABOLIC PANEL
BUN: 18 mg/dL (ref 7–25)
CO2: 20 mmol/L (ref 20–31)
Calcium: 9.5 mg/dL (ref 8.6–10.3)
Chloride: 103 mmol/L (ref 98–110)
Creat: 1.27 mg/dL — ABNORMAL HIGH (ref 0.70–1.11)
Glucose, Bld: 121 mg/dL — ABNORMAL HIGH (ref 65–99)
Potassium: 4.3 mmol/L (ref 3.5–5.3)
Sodium: 138 mmol/L (ref 135–146)

## 2016-04-04 ENCOUNTER — Ambulatory Visit (INDEPENDENT_AMBULATORY_CARE_PROVIDER_SITE_OTHER): Payer: Medicare Other | Admitting: Sports Medicine

## 2016-04-04 ENCOUNTER — Encounter: Payer: Self-pay | Admitting: Sports Medicine

## 2016-04-04 DIAGNOSIS — M48062 Spinal stenosis, lumbar region with neurogenic claudication: Secondary | ICD-10-CM | POA: Diagnosis not present

## 2016-04-04 DIAGNOSIS — M48061 Spinal stenosis, lumbar region without neurogenic claudication: Secondary | ICD-10-CM

## 2016-04-04 MED ORDER — CELECOXIB 200 MG PO CAPS: ORAL_CAPSULE | ORAL | 2 refills | Status: DC

## 2016-04-04 MED ORDER — CYCLOBENZAPRINE HCL 10 MG PO TABS: ORAL_TABLET | ORAL | 0 refills | Status: DC

## 2016-04-04 NOTE — Progress Notes (Signed)
   Subjective:    I'm seeing this patient as a consultation for:  Dr. [name]  CC: Low back pain  HPI: For decades this pleasant 82 year old male has had pain that he localizes in the midline of his low back, left worse than right, with weakness in his legs, and pain with standing, feels better when walking in a flexed position. He has already had physical therapy, however he is in a great deal of pain and this limits his participation.  He has not been on any NSAIDs due to renal issues however his creatinine most recently is less than 1.3. He has never had an epidural injection. Currently using Ultram and Tylenol. Has significant muscle spasm at night, they're wondering if he can do a low-dose Flexeril at bedtime.  Past medical history:  Negative.  See flowsheet/record as well for more information.  Surgical history: Negative.  See flowsheet/record as well for more information.  Family history: Negative.  See flowsheet/record as well for more information.  Social history: Negative.  See flowsheet/record as well for more information.  Allergies, and medications have been entered into the medical record, reviewed, and no changes needed.   Review of Systems: No headache, visual changes, nausea, vomiting, diarrhea, constipation, dizziness, abdominal pain, skin rash, fevers, chills, night sweats, weight loss, swollen lymph nodes, body aches, joint swelling, muscle aches, chest pain, shortness of breath, mood changes, visual or auditory hallucinations.   Objective:   General: Well Developed, well nourished, and in no acute distress.  Neuro/Psych: Alert and oriented x3, extra-ocular muscles intact, able to move all 4 extremities, sensation grossly intact. Skin: Warm and dry, no rashes noted.  Respiratory: Not using accessory muscles, speaking in full sentences, trachea midline.  Cardiovascular: Pulses palpable, no extremity edema. Abdomen: Does not appear distended. Back Exam:    Inspection: Unremarkable  Motion: Walks in a flexed position classic for lumbar spinal stenosis Rotation to 45 deg bilaterally  SLR laying: Negative  XSLR laying: Negative  Palpable tenderness: None. FABER: negative. Sensory change: Gross sensation intact to all lumbar and sacral dermatomes.  Reflexes: 2+ at both patellar tendons, 2+ at achilles tendons, Babinski's downgoing.  Strength at foot  Plantar-flexion: 5/5 Dorsi-flexion: 5/5 Eversion: 5/5 Inversion: 5/5  Leg strength  Quad: 5/5 Hamstring: 5/5 Hip flexor: 5/5 Hip abductors: 5/5  Gait unremarkable.  MRI shows multilevel spondylosis worst at the L4-L5 level with severe central canal stenosis with disc protrusion, ligamentum flavum hypertrophy and L4-L5 bilateral facet arthritis and hypertrophy.  Impression and Recommendations:   This case required medical decision making of moderate complexity.  Spinal stenosis of lumbar region Severe spinal stenosis at the L4-L5 level with concurrent facet hypertrophy and ligament flavum hypertrophy. He has gone through physical therapy, currently taking Ultram, adding Celebrex, Flexeril at bedtime, and a left L4-L5 interlaminar epidural. Return to see me one month after the epidural, we will consider L4-L5 facet injections if no better.

## 2016-04-04 NOTE — Assessment & Plan Note (Signed)
Severe spinal stenosis at the L4-L5 level with concurrent facet hypertrophy and ligament flavum hypertrophy. He has gone through physical therapy, currently taking Ultram, adding Celebrex, Flexeril at bedtime, and a left L4-L5 interlaminar epidural. Return to see me one month after the epidural, we will consider L4-L5 facet injections if no better.

## 2016-04-12 ENCOUNTER — Encounter: Payer: Self-pay | Admitting: Family Medicine

## 2016-04-17 ENCOUNTER — Ambulatory Visit
Admission: RE | Admit: 2016-04-17 | Discharge: 2016-04-17 | Disposition: A | Discharge status: Home / Self Care | Payer: Medicare Other | Source: Ambulatory Visit | Attending: Sports Medicine | Admitting: Sports Medicine

## 2016-04-17 VITALS — BP 160/74 | HR 62

## 2016-04-17 DIAGNOSIS — M48062 Spinal stenosis, lumbar region with neurogenic claudication: Secondary | ICD-10-CM

## 2016-04-17 DIAGNOSIS — M48061 Spinal stenosis, lumbar region without neurogenic claudication: Secondary | ICD-10-CM | POA: Diagnosis not present

## 2016-04-17 MED ORDER — METHYLPREDNISOLONE ACETATE 40 MG/ML INJ SUSP (RADIOLOG
120.0000 mg | Freq: Once | INTRAMUSCULAR | Status: AC
  Administered 2016-04-17: 120 mg via EPIDURAL

## 2016-04-17 MED ORDER — IOPAMIDOL (ISOVUE-M 200) INJECTION 41%
1.0000 mL | Freq: Once | INTRAMUSCULAR | Status: AC
  Administered 2016-04-17: 1 mL via EPIDURAL

## 2016-04-17 NOTE — Discharge Instructions (Signed)

## 2016-04-26 ENCOUNTER — Encounter: Payer: Self-pay | Admitting: Family Medicine

## 2016-04-29 ENCOUNTER — Other Ambulatory Visit: Payer: Self-pay | Admitting: Family Medicine

## 2016-04-30 ENCOUNTER — Other Ambulatory Visit: Payer: Self-pay | Admitting: Family Medicine

## 2016-05-21 ENCOUNTER — Other Ambulatory Visit: Payer: Self-pay

## 2016-05-21 MED ORDER — METFORMIN HCL 1000 MG PO TABS: ORAL_TABLET | ORAL | 0 refills | Status: DC

## 2016-05-21 MED ORDER — GLIPIZIDE 10 MG PO TABS: ORAL_TABLET | ORAL | 2 refills | Status: DC

## 2016-05-21 MED ORDER — METOPROLOL TARTRATE 50 MG PO TABS: 50.0000 mg | ORAL_TABLET | Freq: Two times a day (BID) | ORAL | 1 refills | Status: DC

## 2016-05-21 MED ORDER — AMLODIPINE BESYLATE 10 MG PO TABS: 10.0000 mg | ORAL_TABLET | Freq: Every day | ORAL | 1 refills | Status: DC

## 2016-05-24 ENCOUNTER — Other Ambulatory Visit: Payer: Self-pay

## 2016-05-24 MED ORDER — LISINOPRIL 20 MG PO TABS: 20.0000 mg | ORAL_TABLET | Freq: Every day | ORAL | 3 refills | Status: DC

## 2016-05-24 MED ORDER — MECLIZINE HCL 25 MG PO TABS: ORAL_TABLET | ORAL | 1 refills | Status: DC

## 2016-05-30 ENCOUNTER — Telehealth: Payer: Self-pay | Admitting: *Deleted

## 2016-05-30 NOTE — Telephone Encounter (Signed)
Received notification from [facility] wanting to clarify directions on meclizine prescription that was sent on 05/24/16.They want clarification on the frequency.Sig states as needed but the pharmacy wants it to either specify BID or QID

## 2016-05-30 NOTE — Telephone Encounter (Signed)
Ok.I'm sorry I sent the phone note before I realized it needed your signature. Placed it in your box for signature

## 2016-05-30 NOTE — Telephone Encounter (Signed)
OK for BID prn

## 2016-06-01 ENCOUNTER — Other Ambulatory Visit: Payer: Self-pay | Admitting: Family Medicine

## 2016-06-20 ENCOUNTER — Other Ambulatory Visit: Payer: Self-pay | Admitting: Family Medicine

## 2016-06-29 DIAGNOSIS — E119 Type 2 diabetes mellitus without complications: Secondary | ICD-10-CM | POA: Diagnosis not present

## 2016-07-04 ENCOUNTER — Encounter: Payer: Self-pay | Admitting: Family Medicine

## 2016-07-04 ENCOUNTER — Ambulatory Visit (INDEPENDENT_AMBULATORY_CARE_PROVIDER_SITE_OTHER): Payer: Medicare Other | Admitting: Sports Medicine

## 2016-07-04 ENCOUNTER — Ambulatory Visit (INDEPENDENT_AMBULATORY_CARE_PROVIDER_SITE_OTHER): Payer: Medicare Other | Admitting: Family Medicine

## 2016-07-04 VITALS — BP 122/59 | HR 69 | Ht 70.0 in | Wt 216.0 lb

## 2016-07-04 DIAGNOSIS — M48062 Spinal stenosis, lumbar region with neurogenic claudication: Secondary | ICD-10-CM

## 2016-07-04 DIAGNOSIS — F32A Depression, unspecified: Secondary | ICD-10-CM

## 2016-07-04 DIAGNOSIS — F329 Major depressive disorder, single episode, unspecified: Secondary | ICD-10-CM | POA: Diagnosis not present

## 2016-07-04 DIAGNOSIS — E1151 Type 2 diabetes mellitus with diabetic peripheral angiopathy without gangrene: Secondary | ICD-10-CM | POA: Diagnosis not present

## 2016-07-04 DIAGNOSIS — I10 Essential (primary) hypertension: Secondary | ICD-10-CM | POA: Diagnosis not present

## 2016-07-04 LAB — POCT GLYCOSYLATED HEMOGLOBIN (HGB A1C): Hemoglobin A1C: 6.7

## 2016-07-04 NOTE — Progress Notes (Signed)
  Subjective:    CC: Follow-up after epidural  HPI: Clayton Harrison is a very pleasant 83 year old male, he did extremely well after a lumbar epidural, he is requesting a repeat. He had 2 weeks of fantastic relief with the return of baseline pain. He has known multifactorial severe lumbar spinal stenosis at the L4-L5 level.  Past medical history:  Negative.  See flowsheet/record as well for more information.  Surgical history: Negative.  See flowsheet/record as well for more information.  Family history: Negative.  See flowsheet/record as well for more information.  Social history: Negative.  See flowsheet/record as well for more information.  Allergies, and medications have been entered into the medical record, reviewed, and no changes needed.   Review of Systems: No fevers, chills, night sweats, weight loss, chest pain, or shortness of breath.   Objective:    General: Well Developed, well nourished, and in no acute distress.  Neuro: Alert and oriented x3, extra-ocular muscles intact, sensation grossly intact.  HEENT: Normocephalic, atraumatic, pupils equal round reactive to light, neck supple, no masses, no lymphadenopathy, thyroid nonpalpable.  Skin: Warm and dry, no rashes. Cardiac: Regular rate and rhythm, no murmurs rubs or gallops, no lower extremity edema.  Respiratory: Clear to auscultation bilaterally. Not using accessory muscles, speaking in full sentences.  Impression and Recommendations:    Spinal stenosis of lumbar region Severe spinal stenosis at the L4-L5 level with facet hypertrophy and ligamentum hypertrophy at the same level. He did get fantastic relief for 2 weeks after his first L4-L5 left-sided interlaminar epidural, I am ordering a repeat epidural, this will be #2 and the series and we can certainly try #3. If he gets insufficient relief after all of the above we will proceed with bilateral L4-L5 facet joint injections.  I spent 25 minutes with this patient, greater than  50% was face-to-face time counseling regarding the above diagnoses

## 2016-07-04 NOTE — Assessment & Plan Note (Signed)
Severe spinal stenosis at the L4-L5 level with facet hypertrophy and ligamentum hypertrophy at the same level. He did get fantastic relief for 2 weeks after his first L4-L5 left-sided interlaminar epidural, I am ordering a repeat epidural, this will be #2 and the series and we can certainly try #3. If he gets insufficient relief after all of the above we will proceed with bilateral L4-L5 facet joint injections.

## 2016-07-04 NOTE — Progress Notes (Signed)
Subjective:    CC: DM  HPI:  Diabetes - no hypoglycemic events. No wounds or sores that are not healing well. No increased thirst or urination. Checking glucose at home. Taking medications as prescribed without any side effects.  Hypertension- Pt denies chest pain, SOB, dizziness, or heart palpitations.  Taking meds as directed w/o problems.  Denies medication side effects.    Has felt more down and depressed recently struggling with his back pain. He did get an injection near the holidays and got pain relief for about 2 weeks. He says pain almost went back down to baseline but it started gradually getting worse again and he just hasn't felt great since then. Is been very sedentary  Past medical history, Surgical history, Family history not pertinant except as noted below, Social history, Allergies, and medications have been entered into the medical record, reviewed, and corrections made.   Review of Systems: No fevers, chills, night sweats, weight loss, chest pain, or shortness of breath.   Objective:    General: Well Developed, well nourished, and in no acute distress.  Neuro: Alert and oriented x3, extra-ocular muscles intact, sensation grossly intact.  HEENT: Normocephalic, atraumatic  Skin: Warm and dry, no rashes. Cardiac: Regular rate and rhythm, no murmurs rubs or gallops, no lower extremity edema.  Respiratory: Clear to auscultation bilaterally. Not using accessory muscles, speaking in full sentences.   Impression and Recommendations:   DM - Well controlled. Continue current regimen. Follow up in  3 months.  Lab Results  Component Value Date   HGBA1C 6.7 07/04/2016   HTN - Well controlled. Continue current regimen. Follow up in  3-4 mo.   Mild depression-related to recent increase in back pain. He is seeing one of our sports medicine docs today they will likely repeat an injection hopefully this will give him some relief but if he feels like his mood is still low and please  let me know and we can consider treatment/therapy.

## 2016-07-09 ENCOUNTER — Other Ambulatory Visit: Payer: Self-pay | Admitting: Family Medicine

## 2016-07-10 ENCOUNTER — Other Ambulatory Visit: Payer: Medicare Other

## 2016-07-12 ENCOUNTER — Ambulatory Visit
Admission: RE | Admit: 2016-07-12 | Discharge: 2016-07-12 | Disposition: A | Discharge status: Home / Self Care | Payer: Medicare Other | Source: Ambulatory Visit | Attending: Sports Medicine | Admitting: Sports Medicine

## 2016-07-12 DIAGNOSIS — M47817 Spondylosis without myelopathy or radiculopathy, lumbosacral region: Secondary | ICD-10-CM | POA: Diagnosis not present

## 2016-07-12 MED ORDER — METHYLPREDNISOLONE ACETATE 40 MG/ML INJ SUSP (RADIOLOG
120.0000 mg | Freq: Once | INTRAMUSCULAR | Status: AC
  Administered 2016-07-12: 120 mg via EPIDURAL

## 2016-07-12 MED ORDER — IOPAMIDOL (ISOVUE-M 200) INJECTION 41%
1.0000 mL | Freq: Once | INTRAMUSCULAR | Status: AC
  Administered 2016-07-12: 1 mL via EPIDURAL

## 2016-07-12 NOTE — Discharge Instructions (Signed)

## 2016-07-15 ENCOUNTER — Other Ambulatory Visit: Payer: Medicare Other

## 2016-07-29 DIAGNOSIS — M9903 Segmental and somatic dysfunction of lumbar region: Secondary | ICD-10-CM | POA: Diagnosis not present

## 2016-07-29 DIAGNOSIS — M4726 Other spondylosis with radiculopathy, lumbar region: Secondary | ICD-10-CM | POA: Diagnosis not present

## 2016-07-30 DIAGNOSIS — M9903 Segmental and somatic dysfunction of lumbar region: Secondary | ICD-10-CM | POA: Diagnosis not present

## 2016-07-30 DIAGNOSIS — M4726 Other spondylosis with radiculopathy, lumbar region: Secondary | ICD-10-CM | POA: Diagnosis not present

## 2016-07-31 DIAGNOSIS — M9903 Segmental and somatic dysfunction of lumbar region: Secondary | ICD-10-CM | POA: Diagnosis not present

## 2016-07-31 DIAGNOSIS — M4726 Other spondylosis with radiculopathy, lumbar region: Secondary | ICD-10-CM | POA: Diagnosis not present

## 2016-08-05 DIAGNOSIS — M9903 Segmental and somatic dysfunction of lumbar region: Secondary | ICD-10-CM | POA: Diagnosis not present

## 2016-08-05 DIAGNOSIS — M4726 Other spondylosis with radiculopathy, lumbar region: Secondary | ICD-10-CM | POA: Diagnosis not present

## 2016-08-06 DIAGNOSIS — M9903 Segmental and somatic dysfunction of lumbar region: Secondary | ICD-10-CM | POA: Diagnosis not present

## 2016-08-06 DIAGNOSIS — M4726 Other spondylosis with radiculopathy, lumbar region: Secondary | ICD-10-CM | POA: Diagnosis not present

## 2016-08-08 DIAGNOSIS — M9903 Segmental and somatic dysfunction of lumbar region: Secondary | ICD-10-CM | POA: Diagnosis not present

## 2016-08-08 DIAGNOSIS — M4726 Other spondylosis with radiculopathy, lumbar region: Secondary | ICD-10-CM | POA: Diagnosis not present

## 2016-08-09 ENCOUNTER — Ambulatory Visit (INDEPENDENT_AMBULATORY_CARE_PROVIDER_SITE_OTHER): Payer: Medicare Other | Admitting: Ophthalmology

## 2016-08-12 DIAGNOSIS — M9903 Segmental and somatic dysfunction of lumbar region: Secondary | ICD-10-CM | POA: Diagnosis not present

## 2016-08-12 DIAGNOSIS — M4726 Other spondylosis with radiculopathy, lumbar region: Secondary | ICD-10-CM | POA: Diagnosis not present

## 2016-08-13 DIAGNOSIS — M4726 Other spondylosis with radiculopathy, lumbar region: Secondary | ICD-10-CM | POA: Diagnosis not present

## 2016-08-13 DIAGNOSIS — M9903 Segmental and somatic dysfunction of lumbar region: Secondary | ICD-10-CM | POA: Diagnosis not present

## 2016-08-14 DIAGNOSIS — M4726 Other spondylosis with radiculopathy, lumbar region: Secondary | ICD-10-CM | POA: Diagnosis not present

## 2016-08-14 DIAGNOSIS — M9903 Segmental and somatic dysfunction of lumbar region: Secondary | ICD-10-CM | POA: Diagnosis not present

## 2016-08-19 DIAGNOSIS — M9903 Segmental and somatic dysfunction of lumbar region: Secondary | ICD-10-CM | POA: Diagnosis not present

## 2016-08-19 DIAGNOSIS — M4726 Other spondylosis with radiculopathy, lumbar region: Secondary | ICD-10-CM | POA: Diagnosis not present

## 2016-08-20 DIAGNOSIS — M9903 Segmental and somatic dysfunction of lumbar region: Secondary | ICD-10-CM | POA: Diagnosis not present

## 2016-08-20 DIAGNOSIS — M4726 Other spondylosis with radiculopathy, lumbar region: Secondary | ICD-10-CM | POA: Diagnosis not present

## 2016-08-21 DIAGNOSIS — M9903 Segmental and somatic dysfunction of lumbar region: Secondary | ICD-10-CM | POA: Diagnosis not present

## 2016-08-21 DIAGNOSIS — M4726 Other spondylosis with radiculopathy, lumbar region: Secondary | ICD-10-CM | POA: Diagnosis not present

## 2016-08-26 DIAGNOSIS — M4726 Other spondylosis with radiculopathy, lumbar region: Secondary | ICD-10-CM | POA: Diagnosis not present

## 2016-08-26 DIAGNOSIS — M9903 Segmental and somatic dysfunction of lumbar region: Secondary | ICD-10-CM | POA: Diagnosis not present

## 2016-08-28 DIAGNOSIS — M9903 Segmental and somatic dysfunction of lumbar region: Secondary | ICD-10-CM | POA: Diagnosis not present

## 2016-08-28 DIAGNOSIS — M4726 Other spondylosis with radiculopathy, lumbar region: Secondary | ICD-10-CM | POA: Diagnosis not present

## 2016-09-02 DIAGNOSIS — M4726 Other spondylosis with radiculopathy, lumbar region: Secondary | ICD-10-CM | POA: Diagnosis not present

## 2016-09-02 DIAGNOSIS — M9903 Segmental and somatic dysfunction of lumbar region: Secondary | ICD-10-CM | POA: Diagnosis not present

## 2016-09-06 ENCOUNTER — Ambulatory Visit (INDEPENDENT_AMBULATORY_CARE_PROVIDER_SITE_OTHER): Payer: Medicare Other | Admitting: Family Medicine

## 2016-09-06 ENCOUNTER — Encounter: Payer: Self-pay | Admitting: Family Medicine

## 2016-09-06 VITALS — BP 140/64 | HR 70 | Temp 98.3°F | Ht 70.0 in | Wt 218.0 lb

## 2016-09-06 DIAGNOSIS — R6889 Other general symptoms and signs: Secondary | ICD-10-CM

## 2016-09-06 MED ORDER — AMOXICILLIN-POT CLAVULANATE 875-125 MG PO TABS: 1.0000 | ORAL_TABLET | Freq: Two times a day (BID) | ORAL | 0 refills | Status: DC

## 2016-09-06 MED ORDER — INSULIN GLARGINE 300 UNIT/ML ~~LOC~~ SOPN: 28.0000 [IU] | PEN_INJECTOR | Freq: Every day | SUBCUTANEOUS | 2 refills | Status: DC

## 2016-09-06 NOTE — Progress Notes (Signed)
   Subjective:    Patient ID: RYO KLANG, male    DOB: 05/15/1931, 83 y.o.   MRN: [id number]  HPI 83 year old male comes in today with one week of respiratory symptoms. He is complaining mostly of chest congestion and body aches and runny nose and sore throat. He has not had any fever. He's been taking over-the-counter Coricidin.ST on the right side of her neck.  The Coricidin kept him awake last night.  He Has also had nasal congestion and runny nose. No significant facial pain or pressure but he has had some headaches.    Review of Systems     Objective:   Physical Exam  Constitutional: He is oriented to person, place, and time. He appears well-developed and well-nourished.  HENT:  Head: Normocephalic and atraumatic.  Right Ear: External ear normal.  Left Ear: External ear normal.  Nose: Nose normal.  Mouth/Throat: Oropharynx is clear and moist.  TMs and canals are clear.   Eyes: Conjunctivae and EOM are normal. Pupils are equal, round, and reactive to light.  Neck: Neck supple. No thyromegaly present.  Cardiovascular: Normal rate and normal heart sounds.   Pulmonary/Chest: Effort normal and breath sounds normal.  Lymphadenopathy:    He has no cervical adenopathy.  Neurological: He is alert and oriented to person, place, and time.  Skin: Skin is warm and dry.  Psychiatric: He has a normal mood and affect.          Assessment & Plan:  Flulike illness-he has all the symptoms except for an actual fever. He is Artie been sick for 4-5 days. Encouraged him to continue symptomatically care. Switch to plain Mucinex since the other cough medicine seems to keep him awake. He is not feeling better over the weekend are suddenly feels worse than he can fill a perception for the Augmentin. Make sure staying well-hydrated and follow-up if needed.

## 2016-09-09 ENCOUNTER — Telehealth: Payer: Self-pay | Admitting: *Deleted

## 2016-09-09 MED ORDER — PROMETHAZINE-DM 6.25-15 MG/5ML PO SYRP: 5.0000 mL | ORAL_SOLUTION | Freq: Every evening | ORAL | 0 refills | Status: DC | PRN

## 2016-09-09 NOTE — Telephone Encounter (Signed)
Pt's wife called and wanted to know if a cough med can be sent for in for his cough. rx for promethazine-dm sent to his pharmacy.Audelia Hives Isabel

## 2016-09-27 DIAGNOSIS — E119 Type 2 diabetes mellitus without complications: Secondary | ICD-10-CM | POA: Diagnosis not present

## 2016-10-01 ENCOUNTER — Ambulatory Visit (INDEPENDENT_AMBULATORY_CARE_PROVIDER_SITE_OTHER): Payer: Medicare Other | Admitting: Family Medicine

## 2016-10-01 ENCOUNTER — Encounter: Payer: Self-pay | Admitting: Family Medicine

## 2016-10-01 VITALS — BP 114/51 | HR 67 | Wt 220.0 lb

## 2016-10-01 DIAGNOSIS — Z6831 Body mass index (BMI) 31.0-31.9, adult: Secondary | ICD-10-CM

## 2016-10-01 DIAGNOSIS — E1151 Type 2 diabetes mellitus with diabetic peripheral angiopathy without gangrene: Secondary | ICD-10-CM | POA: Diagnosis not present

## 2016-10-01 DIAGNOSIS — N182 Chronic kidney disease, stage 2 (mild): Secondary | ICD-10-CM | POA: Diagnosis not present

## 2016-10-01 DIAGNOSIS — I779 Disorder of arteries and arterioles, unspecified: Secondary | ICD-10-CM

## 2016-10-01 DIAGNOSIS — I10 Essential (primary) hypertension: Secondary | ICD-10-CM

## 2016-10-01 DIAGNOSIS — I739 Peripheral vascular disease, unspecified: Secondary | ICD-10-CM

## 2016-10-01 LAB — POCT GLYCOSYLATED HEMOGLOBIN (HGB A1C): Hemoglobin A1C: 6.7

## 2016-10-01 NOTE — Progress Notes (Signed)
Subjective:    CC: DM, HTN  HPI:  Started seeing a chiropractor for about 6 weeks for her chronic low back pain. Hx of spinal stenosis. Says this has actually been very helpful for his pain and is doing maintenance sessions for now.  NOt exercising bc of this.   Diabetes - no hypoglycemic events. No wounds or sores that are not healing well. No increased thirst or urination. Checking glucose at home. Taking medications as prescribed without any side effects. Overdue for eye exam. On Toujeo 32 units daily.  Hypertension- Pt denies chest pain, SOB, dizziness, or heart palpitations.  Taking meds as directed w/o problems.  Denies medication side effects.    CKD 3 - due for recheck on kidneys.    BMI 31-he continues to gain weight. He's been very inactive because of his back. He did start seeing a chiropractor about 6 weeks ago, Dr. Deatra Robinson, and this has been very helpful for his pain.  Carotid artery disease - he is due for repeat US in next 2 months but wants me to listen to his left carotid.  Hx of endarterectomy on the right.    Past medical history, Surgical history, Family history not pertinant except as noted below, Social history, Allergies, and medications have been entered into the medical record, reviewed, and corrections made.   Review of Systems: No fevers, chills, night sweats, weight loss, chest pain, or shortness of breath.   Objective:    General: Well Developed, well nourished, and in no acute distress.  Neuro: Alert and oriented x3, extra-ocular muscles intact, sensation grossly intact.  HEENT: Normocephalic, atraumatic  Skin: Warm and dry, no rashes. Cardiac: Regular rate and rhythm, no murmurs rubs or gallops, no lower extremity edema. No carotid bruit on the left.  Respiratory: Clear to auscultation bilaterally. Not using accessory muscles, speaking in full sentences. MSK:    Impression and Recommendations:    DM- Stable. Well-controlled. Continue Toujeo 32 units  daily. Follow-up in 3-4 months.encouarged a 3-4 lb weight loss by next f/u visit.  Work on portion controll and start exercising again.   Lab Results  Component Value Date   HGBA1C 6.7 10/01/2016    Appointment is scheduled on Thursday. He sees Dr. Zigmund Daniel in [place]. We'll keep an eye out for report next week.  HTN - Well controlled. Continue current regimen. Follow up in  3-4 months.    BMI 31-we discussed strategies to get active again. Unfortunately he has had such difficulty with his back he's had a hard time trying to get to the gym. We discussed starting out with maybe 5 minutes on the stationary bike and walking around doing some stretching and repeating 5 minutes. Gradually increase exercise as tolerated to the doesn't reinjure his back. I really want him to cut back on portion sizes well. Like to see him lose 3-4 pounds by the time I see him back in 3 months.  Carotid artery dx - keep f/u with cards for next doppler.  04/5725- LICA 20-35%.    Chronic low back pain- chiropractic care helping.   CKD 3-check BMP today.

## 2016-10-03 ENCOUNTER — Ambulatory Visit (INDEPENDENT_AMBULATORY_CARE_PROVIDER_SITE_OTHER): Payer: Medicare Other | Admitting: Ophthalmology

## 2016-10-03 DIAGNOSIS — H35033 Hypertensive retinopathy, bilateral: Secondary | ICD-10-CM

## 2016-10-03 DIAGNOSIS — H353134 Nonexudative age-related macular degeneration, bilateral, advanced atrophic with subfoveal involvement: Secondary | ICD-10-CM | POA: Diagnosis not present

## 2016-10-03 DIAGNOSIS — I10 Essential (primary) hypertension: Secondary | ICD-10-CM | POA: Diagnosis not present

## 2016-10-03 DIAGNOSIS — H43813 Vitreous degeneration, bilateral: Secondary | ICD-10-CM

## 2016-10-03 DIAGNOSIS — E113293 Type 2 diabetes mellitus with mild nonproliferative diabetic retinopathy without macular edema, bilateral: Secondary | ICD-10-CM | POA: Diagnosis not present

## 2016-10-03 DIAGNOSIS — E11319 Type 2 diabetes mellitus with unspecified diabetic retinopathy without macular edema: Secondary | ICD-10-CM | POA: Diagnosis not present

## 2016-10-10 ENCOUNTER — Other Ambulatory Visit: Payer: Self-pay | Admitting: Family Medicine

## 2016-10-24 DIAGNOSIS — N182 Chronic kidney disease, stage 2 (mild): Secondary | ICD-10-CM | POA: Diagnosis not present

## 2016-10-24 LAB — BASIC METABOLIC PANEL WITH GFR
BUN: 14 mg/dL (ref 7–25)
CO2: 24 mmol/L (ref 20–31)
Calcium: 9 mg/dL (ref 8.6–10.3)
Chloride: 106 mmol/L (ref 98–110)
Creat: 1.22 mg/dL — ABNORMAL HIGH (ref 0.70–1.11)
GFR, Est African American: 63 mL/min (ref >=60)
GFR, Est Non African American: 54 mL/min — ABNORMAL LOW (ref >=60)
Glucose, Bld: 81 mg/dL (ref 65–99)
Potassium: 4.9 mmol/L (ref 3.5–5.3)
Sodium: 139 mmol/L (ref 135–146)

## 2016-10-24 NOTE — Progress Notes (Signed)
All labs are normal. 

## 2016-10-24 NOTE — Addendum Note (Signed)
Addended by: Teddy Spike on: 10/24/2016 10:38 AM   Modules accepted: Orders

## 2016-12-16 DIAGNOSIS — L720 Epidermal cyst: Secondary | ICD-10-CM | POA: Diagnosis not present

## 2016-12-16 DIAGNOSIS — Z85820 Personal history of malignant melanoma of skin: Secondary | ICD-10-CM | POA: Diagnosis not present

## 2016-12-16 DIAGNOSIS — D3610 Benign neoplasm of peripheral nerves and autonomic nervous system, unspecified: Secondary | ICD-10-CM | POA: Diagnosis not present

## 2016-12-16 DIAGNOSIS — D225 Melanocytic nevi of trunk: Secondary | ICD-10-CM | POA: Diagnosis not present

## 2016-12-16 DIAGNOSIS — L821 Other seborrheic keratosis: Secondary | ICD-10-CM | POA: Diagnosis not present

## 2016-12-24 DIAGNOSIS — E113292 Type 2 diabetes mellitus with mild nonproliferative diabetic retinopathy without macular edema, left eye: Secondary | ICD-10-CM | POA: Diagnosis not present

## 2016-12-24 DIAGNOSIS — H353132 Nonexudative age-related macular degeneration, bilateral, intermediate dry stage: Secondary | ICD-10-CM | POA: Diagnosis not present

## 2016-12-24 DIAGNOSIS — E119 Type 2 diabetes mellitus without complications: Secondary | ICD-10-CM | POA: Diagnosis not present

## 2016-12-24 DIAGNOSIS — H26493 Other secondary cataract, bilateral: Secondary | ICD-10-CM | POA: Diagnosis not present

## 2016-12-24 LAB — HM DIABETES EYE EXAM

## 2016-12-31 ENCOUNTER — Ambulatory Visit (INDEPENDENT_AMBULATORY_CARE_PROVIDER_SITE_OTHER): Payer: Medicare Other | Admitting: Family Medicine

## 2016-12-31 ENCOUNTER — Encounter: Payer: Self-pay | Admitting: Family Medicine

## 2016-12-31 VITALS — BP 144/57 | HR 63 | Ht 70.0 in | Wt 220.0 lb

## 2016-12-31 DIAGNOSIS — R600 Localized edema: Secondary | ICD-10-CM | POA: Diagnosis not present

## 2016-12-31 DIAGNOSIS — E1151 Type 2 diabetes mellitus with diabetic peripheral angiopathy without gangrene: Secondary | ICD-10-CM

## 2016-12-31 DIAGNOSIS — I872 Venous insufficiency (chronic) (peripheral): Secondary | ICD-10-CM | POA: Diagnosis not present

## 2016-12-31 DIAGNOSIS — I10 Essential (primary) hypertension: Secondary | ICD-10-CM

## 2016-12-31 DIAGNOSIS — M48062 Spinal stenosis, lumbar region with neurogenic claudication: Secondary | ICD-10-CM | POA: Diagnosis not present

## 2016-12-31 DIAGNOSIS — R29898 Other symptoms and signs involving the musculoskeletal system: Secondary | ICD-10-CM | POA: Diagnosis not present

## 2016-12-31 LAB — POCT GLYCOSYLATED HEMOGLOBIN (HGB A1C): Hemoglobin A1C: 6.7

## 2016-12-31 MED ORDER — DULOXETINE HCL 30 MG PO CPEP: 30.0000 mg | ORAL_CAPSULE | Freq: Every day | ORAL | 0 refills | Status: DC

## 2016-12-31 NOTE — Patient Instructions (Signed)
Call if you need a diuretic ( fluid pIll ) to take as needed if your ankles keep swelling.

## 2016-12-31 NOTE — Progress Notes (Signed)
Subjective:    CC:   HPI: Diabetes - no hypoglycemic events. No wounds or sores that are not healing well. No increased thirst or urination. Checking glucose at home. Taking medications as prescribed without any side effects.  Hypertension- Pt denies chest pain, SOB, dizziness, or heart palpitations.  Taking meds as directed w/o problems.  Denies medication side effects.  He has noticed some ankle swelling over the last couple of weeks. But he also admits he's been eating frozen dinners more recently. His wife says they do contain a lot of salt.  He is still having a lot of pain in his lower back.  No longer seeing the chiropractor.  He says every time he will he will go home and actually have more pain at least temporarily. He wasn't convinced that it was actually causing improvement. He has a history spinal stenosis. He is not at the point of wanting any type of surgery or treatment and he did injections etc. in the past.  He does complain of some lower extremity weakness and soreness. He says it's difficult even just to walk to his mailbox and back. Mostly because of the spinal stenosis it starts to really cause a lot of back pain. He has been doing some leg lifts while sitting to help strengthen his legs.  Past medical history, Surgical history, Family history not pertinant except as noted below, Social history, Allergies, and medications have been entered into the medical record, reviewed, and corrections made.   Review of Systems: No fevers, chills, night sweats, weight loss, chest pain, or shortness of breath.   Objective:    General: Well Developed, well nourished, and in no acute distress.  Neuro: Alert and oriented x3, extra-ocular muscles intact, sensation grossly intact.  HEENT: Normocephalic, atraumatic  Skin: Warm and dry, no rashes. Cardiac: Regular rate and rhythm, no murmurs rubs or gallops,Bilateral ankle edema worse on the right compared to the left. Trace pitting.   Respiratory: Clear to auscultation bilaterally. Not using accessory muscles, speaking in full sentences.   Impression and Recommendations:    DM- Well controlled. A1c of 6.7 today. Continue current regimen. Follow up in  3 months. Will call to get most recent eye exam with Dr. Zigmund Daniel and Dr. Herbert Deaner.  HTN - Pressure is a little elevated today even upon recheck. I encouraged him to come back in for nurse visit in about a week. He says he will check at home and keep an eye on it. He has had some ankle swelling the last couple weeks. We discussed laying off of the salty foods and seeing if it improves over the next week or so. If not then consider when necessary diuretic.  Low back Pain/spinal stenosis of lumbar spine - will try cymbalta.  We'll start 30 mg.Discussed potential S.E.   Lower extremity edema-work on low-salt diet and if not improving consider when necessary diuresis. He is not having any chest pain or shortness of breath.  Your extremity weakness-dictated we did discuss some stretches and exercises to help strengthen the hamstrings and quads. Walk as much as he is is able to to keep up his strength and reduce his risk of falls.

## 2017-01-06 ENCOUNTER — Encounter: Payer: Self-pay | Admitting: Family Medicine

## 2017-01-07 ENCOUNTER — Encounter: Payer: Self-pay | Admitting: Family Medicine

## 2017-01-14 ENCOUNTER — Other Ambulatory Visit: Payer: Self-pay | Admitting: Family Medicine

## 2017-01-16 ENCOUNTER — Other Ambulatory Visit: Payer: Self-pay | Admitting: Family Medicine

## 2017-01-16 MED ORDER — INSULIN GLARGINE 300 UNIT/ML ~~LOC~~ SOPN: 30.0000 [IU] | PEN_INJECTOR | Freq: Every day | SUBCUTANEOUS | 6 refills | Status: DC

## 2017-01-18 ENCOUNTER — Other Ambulatory Visit: Payer: Self-pay | Admitting: Family Medicine

## 2017-01-30 ENCOUNTER — Encounter: Payer: Self-pay | Admitting: Family Medicine

## 2017-02-03 ENCOUNTER — Other Ambulatory Visit: Payer: Self-pay

## 2017-02-03 DIAGNOSIS — E1151 Type 2 diabetes mellitus with diabetic peripheral angiopathy without gangrene: Secondary | ICD-10-CM

## 2017-02-03 MED ORDER — INSULIN GLARGINE 300 UNIT/ML ~~LOC~~ SOPN: 34.0000 [IU] | PEN_INJECTOR | Freq: Every day | SUBCUTANEOUS | 3 refills | Status: DC

## 2017-02-03 NOTE — Telephone Encounter (Signed)
Spoke with Pt. Rx updated and sent.

## 2017-02-25 ENCOUNTER — Telehealth: Payer: Self-pay | Admitting: Cardiology

## 2017-02-25 DIAGNOSIS — I6529 Occlusion and stenosis of unspecified carotid artery: Secondary | ICD-10-CM

## 2017-02-25 DIAGNOSIS — I10 Essential (primary) hypertension: Secondary | ICD-10-CM

## 2017-02-25 NOTE — Telephone Encounter (Signed)
New Message  Pt call to set up appt for Carotid. Pt will need a new order in the system. Please call back to discuss

## 2017-02-25 NOTE — Telephone Encounter (Signed)
Reviewed, last performed in 2016 w recommendation for 1 year f/u carotid US Returned call. NAS, pt states he didn't realize it'd been 2 years since last checked, wished to schedule this overdue test but needed order. I have placed order in system, msg sent to schedulers & precert to arrange.  Routed to Dr. Stanford Breed for any further advice.

## 2017-02-28 ENCOUNTER — Ambulatory Visit (INDEPENDENT_AMBULATORY_CARE_PROVIDER_SITE_OTHER): Payer: Medicare Other | Admitting: Family Medicine

## 2017-02-28 ENCOUNTER — Encounter: Payer: Self-pay | Admitting: Family Medicine

## 2017-02-28 VITALS — BP 136/62 | HR 69 | Ht 70.0 in | Wt 221.0 lb

## 2017-02-28 DIAGNOSIS — Z23 Encounter for immunization: Secondary | ICD-10-CM | POA: Diagnosis not present

## 2017-02-28 DIAGNOSIS — M48062 Spinal stenosis, lumbar region with neurogenic claudication: Secondary | ICD-10-CM | POA: Diagnosis not present

## 2017-02-28 DIAGNOSIS — E1151 Type 2 diabetes mellitus with diabetic peripheral angiopathy without gangrene: Secondary | ICD-10-CM | POA: Diagnosis not present

## 2017-02-28 MED ORDER — TRAMADOL HCL 50 MG PO TABS: 50.0000 mg | ORAL_TABLET | Freq: Every day | ORAL | 0 refills | Status: DC | PRN

## 2017-02-28 NOTE — Progress Notes (Addendum)
Subjective:    Patient ID: Clayton Harrison, male    DOB: [birth year], 83 y.o.   MRN: [id number]  HPI  83 yo come in today c/o pain radiating into his leg and his feetl.  Legs are cramping and swelling.  He has low energy. Found some old tramadol and that did help.  He never started the  Cymbalta.      He says it was going to cost $140 so he tried the tramadol from last October and says it works really well. Has been taking it once a day and then ran out.  He says after that has been taking 2 Tylenol arthritis BI and that helps some but not as much as the tramadol Pain is worse in the Am.            Says sometimes he feels swollen "all over" at times  .   Has appt next wed for Carotid artery dopplers.  .    Diabetes - sugars running in the 130 over the last week and then for the last 4 days his sugars have been running in the 140s. He is not sure why.  Says things like pasta potatoes and ice cream do tend to drive up his sugar but he's been trying to stay away from those things. Though his wife reminded him while in the room that he did have pasta for lunch yesterday.  Did have some problem filling his insulin locally so now going through mail order.   Review of Systems  BP 136/62   Pulse 69   Ht 5\' 10"  (1.778 m)   Wt 221 lb (100.2 kg)   SpO2 95%   BMI 31.71 kg/m     Allergies  Allergen Reactions  . Shellfish Allergy Anaphylaxis    Swelling of the throat  . Statins Other (See Comments)    Myalgias, urinary frequency  . Ezetimibe Other (See Comments)    "hip problems"  . Flomax [Tamsulosin Hcl] Other (See Comments)    Complains of dizziness  . Prednisone Other (See Comments)    Crazy dreams, insomnia  . Tizanidine Other (See Comments)    Insomnia, nightmares    Past Medical History:  Diagnosis Date  . Adenomatous colon polyp    2.5cm excised by right colectomy 2011  . Anemia, blood loss    history of  . Blockage of coronary artery of heart ([place]) 2001   x 4  . CAD (coronary  artery disease)   . Carotid artery occlusion   . Cataract   . Cerebrovascular disease, unspecified   . Colon tumor 03/2010   right ascending  . DM (diabetes mellitus) ([place])   . Elevated PSA   . GERD (gastroesophageal reflux disease)   . Hearing loss   . HTN (hypertension)   . Hypercholesteremia   . Interstitial cystitis   . Macular degeneration   . Other and unspecified hyperlipidemia   . Prostate cancer ([place]) 03/09/14   Gleason 4+5=9, volume 98 mL  . PVD (peripheral vascular disease) ([place])   . S/P radiation therapy 07/20/2014 through 09/13/2014    Prostate 7800 cGy in 40 sessions, seminal vesicles 5600 cGy in 40 sessions   . SBO (small bowel obstruction) ([place]) 07/2013  . Skin cancer 2012   melanoma upper back, removed  . Statin intolerance   . Vertigo   . Vertigo     Past Surgical History:  Procedure Laterality Date  . APPENDECTOMY  05/2010   with right  hemicolectomy  . CARDIAC CATHETERIZATION  11/1999  . CAROTID ENDARTERECTOMY  2012   right  . CORONARY ARTERY BYPASS GRAFT  2001   4  . dental extractions Bilateral 1980  . DENTAL SURGERY    . [place]  . left carpal tunnel release  2009  . PARTIAL COLECTOMY  05/24/2010   Lap converted to open right proximal colectomy  . PROSTATE BIOPSY  03/09/14   Gleason 9, vol 98 mL  . SKIN CANCER EXCISION  02/2011   upper back  . TONSILLECTOMY  1940  . URETEROLITHOTOMY      Social History   Social History  . Marital status: Married    Spouse name: [name]  . Number of children: 2  . Years of education: HS   Occupational History  . Retired    Social History Main Topics  . Smoking status: Former Smoker    Packs/day: 2.00    Years: 40.00    Quit date: 11/23/1999  . Smokeless tobacco: Current User    Types: Chew  . Alcohol use No  . Drug use: No  . Sexual activity: Yes    Partners: Female   Other Topics Concern  . Not on file    Social History Narrative   No regular exercise. Daily caffeine- 3-4 cups daily          Family History  Problem Relation Age of Onset  . Diabetes Mother   . Heart disease Mother   . Heart failure Mother   . Hypertension Mother   . Heart disease Father   . Colon cancer Maternal Uncle        x4  . Cancer Maternal Uncle        colon  . Cancer Daughter        breast  . Cancer Daughter        breast  . Cancer Cousin        colon    Outpatient Encounter Prescriptions as of 02/28/2017  Medication Sig  . acetaminophen (TYLENOL) 500 MG tablet Take 1,000 mg by mouth every 6 (six) hours as needed for mild pain.  Marland Kitchen amLODipine (NORVASC) 10 MG tablet TAKE 1 TABLET BY MOUTH  DAILY  . aspirin 325 MG tablet Take 325 mg by mouth daily.    . BD PEN NEEDLE NANO U/F 32G X 4 MM MISC INJECT SUBCUTANEOUSLY DAILY  . glipiZIDE (GLUCOTROL) 10 MG tablet TAKE 1 TABLET TWICE A DAY  BEFORE A MEAL  . Insulin Glargine (TOUJEO SOLOSTAR) 300 UNIT/ML SOPN Inject 34 Units into the skin at bedtime.  Marland Kitchen lisinopril (PRINIVIL,ZESTRIL) 20 MG tablet Take 1 tablet (20 mg total) by mouth at bedtime.  . meclizine (ANTIVERT) 25 MG tablet TAKE 1/2 TABLET BY MOUTH AS NEEDED FOR DIZZINESS  . metFORMIN (GLUCOPHAGE) 1000 MG tablet TAKE 1 TABLET BY MOUTH 2  TIMES DAILY WITH A MEAL  . metoprolol tartrate (LOPRESSOR) 50 MG tablet TAKE 1 TABLET BY MOUTH TWO  TIMES DAILY  . traMADol (ULTRAM) 50 MG tablet Take 1 tablet (50 mg total) by mouth daily as needed.  . [DISCONTINUED] DULoxetine (CYMBALTA) 30 MG capsule Take 1 capsule (30 mg total) by mouth daily.  . [DISCONTINUED] glipiZIDE (GLUCOTROL) 10 MG tablet TAKE 1 TABLET TWICE A DAY BEFORE A MEAL  . [DISCONTINUED] traMADol (ULTRAM) 50 MG tablet Take 1 tablet (50 mg total) by mouth daily as needed.   No facility-administered encounter medications on file as of 02/28/2017.  Objective:   Physical Exam  Constitutional: He is oriented to person, place, and time. He  appears well-developed and well-nourished.  HENT:  Head: Normocephalic and atraumatic.  Eyes: Conjunctivae and EOM are normal.  Cardiovascular: Normal rate, regular rhythm and normal heart sounds.   Pulmonary/Chest: Effort normal and breath sounds normal.  Neurological: He is alert and oriented to person, place, and time.  Skin: Skin is warm and dry. No pallor.  Psychiatric: He has a normal mood and affect. His behavior is normal.  Vitals reviewed.         Assessment & Plan:  Chronic low back pain/spinal stenosis - Will start tramadol. Warned about the potential side effects.   Never tried Cymbalta.  Still try to work on increasing activity level.    Diabetes - Elevated in sugars this week. Work on diet this next week . If sugars still elevated them let me know. We can adjust the insulin if needed.  Keep regular f/u in October.    Flu shot given today.

## 2017-02-28 NOTE — Patient Instructions (Signed)
Call me back next week if sugar is still running > 130.

## 2017-02-28 NOTE — Addendum Note (Signed)
Addended by: [name] D on: 02/28/2017 05:29 PM   Modules accepted: Level of Service

## 2017-03-05 ENCOUNTER — Ambulatory Visit (HOSPITAL_COMMUNITY)
Admission: RE | Admit: 2017-03-05 | Discharge: 2017-03-05 | Disposition: A | Discharge status: Home / Self Care | Payer: Medicare Other | Source: Ambulatory Visit | Attending: Cardiovascular Disease | Admitting: Cardiovascular Disease

## 2017-03-05 DIAGNOSIS — I6529 Occlusion and stenosis of unspecified carotid artery: Secondary | ICD-10-CM | POA: Insufficient documentation

## 2017-03-05 DIAGNOSIS — I6523 Occlusion and stenosis of bilateral carotid arteries: Secondary | ICD-10-CM | POA: Diagnosis not present

## 2017-03-25 ENCOUNTER — Other Ambulatory Visit: Payer: Self-pay | Admitting: Family Medicine

## 2017-03-26 ENCOUNTER — Other Ambulatory Visit: Payer: Self-pay | Admitting: *Deleted

## 2017-03-26 MED ORDER — INSULIN PEN NEEDLE 32G X 4 MM MISC: 4 refills | Status: DC

## 2017-04-03 ENCOUNTER — Ambulatory Visit: Payer: Medicare Other | Admitting: Family Medicine

## 2017-04-03 NOTE — Progress Notes (Deleted)
Subjective:    CC: DM, HTN, PVD  HPI: Diabetes - no hypoglycemic events. No wounds or sores that are not healing well. No increased thirst or urination. Checking glucose at home. Taking medications as prescribed without any side effects.  Hypertension- Pt denies chest pain, SOB, dizziness, or heart palpitations.  Taking meds as directed w/o problems.  Denies medication side effects.    PVD -   Past medical history, Surgical history, Family history not pertinant except as noted below, Social history, Allergies, and medications have been entered into the medical record, reviewed, and corrections made.   Review of Systems: No fevers, chills, night sweats, weight loss, chest pain, or shortness of breath.   Objective:    General: Well Developed, well nourished, and in no acute distress.  Neuro: Alert and oriented x3, extra-ocular muscles intact, sensation grossly intact.  HEENT: Normocephalic, atraumatic  Skin: Warm and dry, no rashes. Cardiac: Regular rate and rhythm, no murmurs rubs or gallops, no lower extremity edema.  Respiratory: Clear to auscultation bilaterally. Not using accessory muscles, speaking in full sentences.   Impression and Recommendations:    DM-   HTN -   PVD -

## 2017-04-10 ENCOUNTER — Encounter: Payer: Self-pay | Admitting: Family Medicine

## 2017-04-10 ENCOUNTER — Ambulatory Visit (INDEPENDENT_AMBULATORY_CARE_PROVIDER_SITE_OTHER): Payer: Medicare Other | Admitting: Family Medicine

## 2017-04-10 VITALS — BP 138/69 | HR 65 | Ht 70.0 in | Wt 217.0 lb

## 2017-04-10 DIAGNOSIS — R0609 Other forms of dyspnea: Secondary | ICD-10-CM

## 2017-04-10 DIAGNOSIS — M48062 Spinal stenosis, lumbar region with neurogenic claudication: Secondary | ICD-10-CM

## 2017-04-10 DIAGNOSIS — R0600 Dyspnea, unspecified: Secondary | ICD-10-CM

## 2017-04-10 DIAGNOSIS — E1151 Type 2 diabetes mellitus with diabetic peripheral angiopathy without gangrene: Secondary | ICD-10-CM

## 2017-04-10 DIAGNOSIS — I10 Essential (primary) hypertension: Secondary | ICD-10-CM

## 2017-04-10 LAB — POCT GLYCOSYLATED HEMOGLOBIN (HGB A1C): Hemoglobin A1C: 6.5

## 2017-04-10 NOTE — Progress Notes (Signed)
Subjective:    CC:   HPI:  Diabetes - no hypoglycemic events. No wounds or sores that are not healing well. No increased thirst or urination. Checking glucose at home. Taking medications as prescribed without any side effects.  Hypertension- Pt denies chest pain, SOB, dizziness, or heart palpitations.  Taking meds as directed w/o problems.  Denies medication side effects.    Chronic low back pain/spinal stenosis - Will start tramadol. Warned about the potential side effects.   Never tried Cymbalta.  Still try to work on increasing activity level. He takes one a day and is doing well. He also supplements with Tylenol.  He still just feels extremely fatigued and gets out of breath with minimal activities. He is finding it very frustrating.   Past medical history, Surgical history, Family history not pertinant except as noted below, Social history, Allergies, and medications have been entered into the medical record, reviewed, and corrections made.   Review of Systems: No fevers, chills, night sweats, weight loss, chest pain, or shortness of breath.   Objective:    General: Well Developed, well nourished, and in no acute distress.  Neuro: Alert and oriented x3, extra-ocular muscles intact, sensation grossly intact.  HEENT: Normocephalic, atraumatic  Skin: Warm and dry, no rashes. Cardiac: Regular rate and rhythm, no murmurs rubs or gallops, no lower extremity edema.  Respiratory: Clear to auscultation bilaterally. Not using accessory muscles, speaking in full sentences.   Impression and Recommendations:   HTN  - Well controlled. Continue current regimen. Follow up in  3-4 months.   DM- Well controlled. Continue current regimen. Follow up in  4 months.    Chronic Low Back Pain/ spinal stenosis. The tramadol does seem to be helping and right now he is able to just take 1 a day. He has refills right now but will continue to monitor.  Shortness of breath with activity-we'll have him  come back for spirometry in the next couple weeks. If normal then we'll get him back in with his cardiologist.

## 2017-04-17 ENCOUNTER — Ambulatory Visit (INDEPENDENT_AMBULATORY_CARE_PROVIDER_SITE_OTHER): Payer: Medicare Other | Admitting: Family Medicine

## 2017-04-17 VITALS — BP 160/58 | HR 69 | Ht 70.0 in | Wt 217.0 lb

## 2017-04-17 DIAGNOSIS — R0609 Other forms of dyspnea: Secondary | ICD-10-CM

## 2017-04-17 DIAGNOSIS — J984 Other disorders of lung: Secondary | ICD-10-CM | POA: Diagnosis not present

## 2017-04-17 DIAGNOSIS — R0600 Dyspnea, unspecified: Secondary | ICD-10-CM

## 2017-04-17 DIAGNOSIS — J449 Chronic obstructive pulmonary disease, unspecified: Secondary | ICD-10-CM | POA: Diagnosis not present

## 2017-04-17 MED ORDER — ALBUTEROL SULFATE HFA 108 (90 BASE) MCG/ACT IN AERS: 2.0000 | INHALATION_SPRAY | Freq: Four times a day (QID) | RESPIRATORY_TRACT | 2 refills | Status: DC | PRN

## 2017-04-17 MED ORDER — NYSTATIN 100000 UNIT/GM EX POWD: Freq: Four times a day (QID) | CUTANEOUS | 99 refills | Status: DC

## 2017-04-17 MED ORDER — UMECLIDINIUM BROMIDE 62.5 MCG/INH IN AEPB
1.0000 | INHALATION_SPRAY | Freq: Every day | RESPIRATORY_TRACT | 5 refills | Status: DC
Start: 2017-04-17 — End: 2017-04-22

## 2017-04-17 NOTE — Progress Notes (Signed)
Subjective:    Patient ID: Clayton Harrison, male    DOB: [birth year], 83 y.o.   MRN: [id number]  HPI A 76-year-old male comes in today to follow-up on shortness of breath.  In the last couple office visits visits he had noticed that he was getting a little bit more out of breath with minimal activities.  He has been finding it frustrating.  He has not been experiencing chest pain with the shortness of breath.  He is schedule for spirometry.    He is a former IT trainer and he used to smoke 2-3 ppd years ago. Quit smoking 2001.     Review of Systems  BP (!) 160/58   Pulse 69   Ht 5\' 10"  (1.778 m)   Wt 217 lb (98.4 kg)   SpO2 95%   BMI 31.14 kg/m     Allergies  Allergen Reactions  . Shellfish Allergy Anaphylaxis    Swelling of the throat  . Statins Other (See Comments)    Myalgias, urinary frequency  . Ezetimibe Other (See Comments)    "hip problems"  . Flomax [Tamsulosin Hcl] Other (See Comments)    Complains of dizziness  . Prednisone Other (See Comments)    Crazy dreams, insomnia  . Tizanidine Other (See Comments)    Insomnia, nightmares    Past Medical History:  Diagnosis Date  . Adenomatous colon polyp    2.5cm excised by right colectomy 2011  . Anemia, blood loss    history of  . Blockage of coronary artery of heart ([place]) 2001   x 4  . CAD (coronary artery disease)   . Carotid artery occlusion   . Cataract   . Cerebrovascular disease, unspecified   . Colon tumor 03/2010   right ascending  . DM (diabetes mellitus) ([place])   . Elevated PSA   . GERD (gastroesophageal reflux disease)   . Hearing loss   . HTN (hypertension)   . Hypercholesteremia   . Interstitial cystitis   . Macular degeneration   . Other and unspecified hyperlipidemia   . Prostate cancer ([place]) 03/09/14   Gleason 4+5=9, volume 98 mL  . PVD (peripheral vascular disease) ([place])   . S/P radiation therapy 07/20/2014 through 09/13/2014    Prostate  7800 cGy in 40 sessions, seminal vesicles 5600 cGy in 40 sessions   . SBO (small bowel obstruction) ([place]) 07/2013  . Skin cancer 2012   melanoma upper back, removed  . Statin intolerance   . Vertigo   . Vertigo     Past Surgical History:  Procedure Laterality Date  . APPENDECTOMY  05/2010   with right hemicolectomy  . CARDIAC CATHETERIZATION  11/1999  . CAROTID ENDARTERECTOMY  2012   right  . CORONARY ARTERY BYPASS GRAFT  2001   4  . dental extractions Bilateral 1980  . DENTAL SURGERY    . [place]  . left carpal tunnel release  2009  . PARTIAL COLECTOMY  05/24/2010   Lap converted to open right proximal colectomy  . PROSTATE BIOPSY  03/09/14   Gleason 9, vol 98 mL  . SKIN CANCER EXCISION  02/2011   upper back  . TONSILLECTOMY  1940  . URETEROLITHOTOMY      Social History   Social History  . Marital status: Married    Spouse name: [name]  . Number of children: 2  . Years of education: HS   Occupational History  . Retired    Social History  Main Topics  . Smoking status: Former Smoker    Packs/day: 2.00    Years: 40.00    Quit date: 11/23/1999  . Smokeless tobacco: Current User    Types: Chew  . Alcohol use No  . Drug use: No  . Sexual activity: Yes    Partners: Female   Other Topics Concern  . Not on file   Social History Narrative   No regular exercise. Daily caffeine- 3-4 cups daily          Family History  Problem Relation Age of Onset  . Diabetes Mother   . Heart disease Mother   . Heart failure Mother   . Hypertension Mother   . Heart disease Father   . Cancer Cousin        colon  . Colon cancer Maternal Uncle        x4  . Cancer Maternal Uncle        colon  . Cancer Daughter        breast  . Cancer Daughter        breast    Outpatient Encounter Prescriptions as of 04/17/2017  Medication Sig  . acetaminophen (TYLENOL) 500 MG tablet Take 1,000 mg by mouth every 6 (six) hours as needed for mild  pain.  Marland Kitchen albuterol (PROVENTIL HFA;VENTOLIN HFA) 108 (90 Base) MCG/ACT inhaler Inhale 2 puffs into the lungs every 6 (six) hours as needed for wheezing or shortness of breath.  Marland Kitchen amLODipine (NORVASC) 10 MG tablet TAKE 1 TABLET BY MOUTH  DAILY  . aspirin 325 MG tablet Take 325 mg by mouth daily.    Marland Kitchen glipiZIDE (GLUCOTROL) 10 MG tablet TAKE 1 TABLET TWICE A DAY  BEFORE A MEAL  . Insulin Glargine (TOUJEO SOLOSTAR) 300 UNIT/ML SOPN Inject 34 Units into the skin at bedtime.  . Insulin Pen Needle (BD PEN NEEDLE NANO U/F) 32G X 4 MM MISC INJECT SUBCUTANEOUSLY DAILY  . lisinopril (PRINIVIL,ZESTRIL) 20 MG tablet TAKE 1 TABLET BY MOUTH AT  BEDTIME  . meclizine (ANTIVERT) 25 MG tablet TAKE ONE-HALF TABLET BY  MOUTH TWO TIMES DAILY AS  NEEDED FOR DIZZINESS  . metFORMIN (GLUCOPHAGE) 1000 MG tablet TAKE 1 TABLET BY MOUTH 2  TIMES DAILY WITH A MEAL  . metoprolol tartrate (LOPRESSOR) 50 MG tablet TAKE 1 TABLET BY MOUTH TWO  TIMES DAILY  . traMADol (ULTRAM) 50 MG tablet Take 1 tablet (50 mg total) by mouth daily as needed.  . umeclidinium bromide (INCRUSE ELLIPTA) 62.5 MCG/INH AEPB Inhale 1 puff into the lungs daily.  . [DISCONTINUED] nystatin (MYCOSTATIN/NYSTOP) powder Apply topically 4 (four) times daily.   No facility-administered encounter medications on file as of 04/17/2017.           Objective:   Physical Exam  Constitutional: He is oriented to person, place, and time. He appears well-developed and well-nourished.  HENT:  Head: Normocephalic and atraumatic.  Eyes: Conjunctivae and EOM are normal.  Cardiovascular: Normal rate.   Pulmonary/Chest: Effort normal.  Neurological: He is alert and oriented to person, place, and time.  Skin: Skin is dry. No pallor.  Psychiatric: He has a normal mood and affect. His behavior is normal.  Vitals reviewed.      Assessment & Plan:  COPD/restrictive lung disease - reviewed spirometry results.  It appears it is restrictive and obstructive.  FVC of 73%,  FEV1 of 71% with a ratio of 67%.  No significant improvement with albuterol.  We reviewed these results.  I do think  that with better effort he probably could have had bigger percentage with his FVC.  The plan will be to start him on Incruse daily and give him albuterol inhaler to use as needed.  Also encouraged him to consider using his albuterol 2 puffs before exercise when he does go to the gym.  I would like to see him back in 6 months and consider repeat spirometry at that time.  Again once he does the test again then on-we might get even more accurate results.  Results discussed with him, his wife, and his daughter who were here the for the office visit.  Also discussed the importance of regular exercise.  I think something like a stationary bike or water aerobics would be a great start.  He has never Back problems of is not able to walk very far.

## 2017-04-22 ENCOUNTER — Other Ambulatory Visit: Payer: Self-pay | Admitting: *Deleted

## 2017-04-22 MED ORDER — UMECLIDINIUM BROMIDE 62.5 MCG/INH IN AEPB: 1.0000 | INHALATION_SPRAY | Freq: Every day | RESPIRATORY_TRACT | 5 refills | Status: DC

## 2017-04-23 ENCOUNTER — Other Ambulatory Visit: Payer: Self-pay

## 2017-04-29 DIAGNOSIS — E119 Type 2 diabetes mellitus without complications: Secondary | ICD-10-CM | POA: Diagnosis not present

## 2017-06-19 ENCOUNTER — Other Ambulatory Visit: Payer: Self-pay | Admitting: Family Medicine

## 2017-06-25 ENCOUNTER — Other Ambulatory Visit: Payer: Self-pay | Admitting: *Deleted

## 2017-06-25 MED ORDER — ALBUTEROL SULFATE HFA 108 (90 BASE) MCG/ACT IN AERS: 2.0000 | INHALATION_SPRAY | Freq: Four times a day (QID) | RESPIRATORY_TRACT | 4 refills | Status: DC | PRN

## 2017-07-11 ENCOUNTER — Encounter: Payer: Self-pay | Admitting: Family Medicine

## 2017-07-11 ENCOUNTER — Ambulatory Visit (INDEPENDENT_AMBULATORY_CARE_PROVIDER_SITE_OTHER): Payer: Medicare Other | Admitting: Family Medicine

## 2017-07-11 VITALS — BP 140/72 | HR 64 | Ht 70.0 in | Wt 216.0 lb

## 2017-07-11 DIAGNOSIS — R252 Cramp and spasm: Secondary | ICD-10-CM | POA: Diagnosis not present

## 2017-07-11 DIAGNOSIS — M48062 Spinal stenosis, lumbar region with neurogenic claudication: Secondary | ICD-10-CM | POA: Diagnosis not present

## 2017-07-11 DIAGNOSIS — I10 Essential (primary) hypertension: Secondary | ICD-10-CM

## 2017-07-11 DIAGNOSIS — E1151 Type 2 diabetes mellitus with diabetic peripheral angiopathy without gangrene: Secondary | ICD-10-CM | POA: Diagnosis not present

## 2017-07-11 LAB — POCT GLYCOSYLATED HEMOGLOBIN (HGB A1C): Hemoglobin A1C: 6.6

## 2017-07-11 NOTE — Patient Instructions (Signed)
Try increasing your duloxetine to 2 tabs in the morning and then okay to take your tramadol at bedtime.  Call in a couple of weeks and let us know if you feel like this is working a little bit better for your pain regimen.  If it is, then please let us know and we can send over a new prescription to the mail order.

## 2017-07-11 NOTE — Progress Notes (Signed)
Subjective:    CC: DM  HPI:  Diabetes - no hypoglycemic events. No wounds or sores that are not healing well. No increased thirst or urination. Checking glucose at home. Taking medications as prescribed without any side effects.  Hypertension- Pt denies chest pain, SOB, dizziness, or heart palpitations.  Taking meds as directed w/o problems.  Denies medication side effects.    In regards to his chronic back pain he finally received the shipment of the duloxetine 30 mg and has started taking that and has held off on the tramadol since he got that in.  He says that he feels like they work really about the same.  Just a minor amount of improvement but not significant.  He said it was less than probably 25% improvement.  Also complains of muscle cramps in his calves particularly at night.  He is been using alcohol to rub them down and he says that is helpful.   Past medical history, Surgical history, Family history not pertinant except as noted below, Social history, Allergies, and medications have been entered into the medical record, reviewed, and corrections made.   Review of Systems: No fevers, chills, night sweats, weight loss, chest pain, or shortness of breath.   Objective:    General: Well Developed, well nourished, and in no acute distress.  Neuro: Alert and oriented x3, extra-ocular muscles intact, sensation grossly intact.  HEENT: Normocephalic, atraumatic  Skin: Warm and dry, no rashes. Cardiac: Regular rate and rhythm, no murmurs rubs or gallops, no lower extremity edema.  Respiratory: Clear to auscultation bilaterally. Not using accessory muscles, speaking in full sentences.   Impression and Recommendations:    DM-controlled.  Hemoglobin A1c of 6.6 today which looks fantastic.  HTN -repeat blood pressure still just borderline elevated but significantly better than the initial pressure.  He really feels like it pressure is up because of his pain levels.  We will try to  continue to work on this and follow carefully.  Follow-up chronic back pain secondary to spinal stenosis.-discussed options.  Not interested in any type of surgery at this point.  We did discuss physical therapy as an option.  He did this probably about 2 years ago and it was helpful at the time.  Injections were not helpful.  Will increase duloxetine to 60 mg each morning. Madaline Brilliant to use the tramadol as needed at bedtime.   Muscle cramps of the calves bilaterally-discussed potential causes.  Encouraged him to work on stretches as well as drinking plenty of water as this does help.  Will evaluate with labs for other causes and will call with results once available.  Consider treatment options if not improving.

## 2017-07-12 LAB — COMPLETE METABOLIC PANEL WITH GFR
AG Ratio: 1.9 (calc) (ref 1.0–2.5)
ALT: 14 U/L (ref 9–46)
AST: 15 U/L (ref 10–35)
Albumin: 4.6 g/dL (ref 3.6–5.1)
Alkaline phosphatase (APISO): 60 U/L (ref 40–115)
BUN/Creatinine Ratio: 15 (calc) (ref 6–22)
BUN: 19 mg/dL (ref 7–25)
CO2: 24 mmol/L (ref 20–32)
Calcium: 9.2 mg/dL (ref 8.6–10.3)
Chloride: 104 mmol/L (ref 98–110)
Creat: 1.29 mg/dL — ABNORMAL HIGH (ref 0.70–1.11)
GFR, Est African American: 59 mL/min/{1.73_m2} — ABNORMAL LOW (ref >=60)
GFR, Est Non African American: 51 mL/min/{1.73_m2} — ABNORMAL LOW (ref >=60)
Globulin: 2.4 g/dL (calc) (ref 1.9–3.7)
Glucose, Bld: 121 mg/dL — ABNORMAL HIGH (ref 65–99)
Potassium: 4.7 mmol/L (ref 3.5–5.3)
Sodium: 138 mmol/L (ref 135–146)
Total Bilirubin: 0.4 mg/dL (ref 0.2–1.2)
Total Protein: 7 g/dL (ref 6.1–8.1)

## 2017-07-12 LAB — SEDIMENTATION RATE: Sed Rate: 11 mm/h (ref 0–20)

## 2017-07-12 LAB — TSH: TSH: 2.1 mIU/L (ref 0.40–4.50)

## 2017-07-12 LAB — MAGNESIUM: Magnesium: 1.7 mg/dL (ref 1.5–2.5)

## 2017-07-12 LAB — CK: Total CK: 140 U/L (ref 44–196)

## 2017-07-30 ENCOUNTER — Other Ambulatory Visit: Payer: Self-pay | Admitting: Family Medicine

## 2017-07-30 DIAGNOSIS — E1151 Type 2 diabetes mellitus with diabetic peripheral angiopathy without gangrene: Secondary | ICD-10-CM

## 2017-08-04 ENCOUNTER — Telehealth: Payer: Self-pay

## 2017-08-04 MED ORDER — DULOXETINE HCL 60 MG PO CPEP: 60.0000 mg | ORAL_CAPSULE | Freq: Every day | ORAL | 1 refills | Status: DC

## 2017-08-04 NOTE — Telephone Encounter (Signed)
Wonderful.  Prescription sent to mail order.

## 2017-08-04 NOTE — Telephone Encounter (Signed)
Clayton Harrison called and states he was advised to increase the Cymbalta 30 mg to two capsules daily. He needs an updated prescription sent to [facility]. He reports it is helping with his back pain. He still has leg pain but the back pain is almost gone. Please advise.

## 2017-08-05 NOTE — Telephone Encounter (Signed)
Pt advised.

## 2017-08-22 ENCOUNTER — Encounter: Payer: Self-pay | Admitting: Family Medicine

## 2017-08-22 ENCOUNTER — Ambulatory Visit (INDEPENDENT_AMBULATORY_CARE_PROVIDER_SITE_OTHER): Payer: Medicare Other | Admitting: Family Medicine

## 2017-08-22 VITALS — BP 148/62 | HR 65 | Ht 70.0 in | Wt 217.0 lb

## 2017-08-22 DIAGNOSIS — M48062 Spinal stenosis, lumbar region with neurogenic claudication: Secondary | ICD-10-CM | POA: Diagnosis not present

## 2017-08-22 DIAGNOSIS — I739 Peripheral vascular disease, unspecified: Secondary | ICD-10-CM

## 2017-08-22 DIAGNOSIS — G8929 Other chronic pain: Secondary | ICD-10-CM | POA: Diagnosis not present

## 2017-08-22 DIAGNOSIS — J449 Chronic obstructive pulmonary disease, unspecified: Secondary | ICD-10-CM | POA: Diagnosis not present

## 2017-08-22 DIAGNOSIS — M62830 Muscle spasm of back: Secondary | ICD-10-CM | POA: Diagnosis not present

## 2017-08-22 MED ORDER — CYCLOBENZAPRINE HCL 10 MG PO TABS: 5.0000 mg | ORAL_TABLET | Freq: Three times a day (TID) | ORAL | 0 refills | Status: DC | PRN

## 2017-08-22 NOTE — Progress Notes (Signed)
Subjective:    CC: spinal stenosis   HPI:   Follow-up chronic back pain secondary to spinal stenosis.- discussed options at last OV.  Not interested in any type of surgery at this point.  We did discuss physical therapy as an option.  He did this probably about 2 years ago and it was helpful at the time.  Injections were not helpful.    He takes duloxetine 60 mg in the morning and tramadol in the evening.  Sometimes in the afternoon though he gets an increase in his pain and wants to know what he could safely take that might help.  He wanted to know if Tylenol would be okay with his current medications.  He is still having weakness in his legs and feeling unsteady with his legs at time.  Says he is also gets occasional spasms in his low back.  Right now he is had a spasm on his right low back for about the last 3 days.  Diabetes-also says being on Cymbalta his blood sugars have been elevated a little bit.  Though he admits he is been eating sweets and chocolates.  PVD -again continues to have pain in his lower extremities.  COPD-overall he is doing really well.  He is this is increased every day.  He often goes several weeks in between using his albuterol.  He said there was a problem with his mail order with the albuterol but he has plenty and does not really need another one right now.  Past medical history, Surgical history, Family history not pertinant except as noted below, Social history, Allergies, and medications have been entered into the medical record, reviewed, and corrections made.   Review of Systems: No fevers, chills, night sweats, weight loss, chest pain, or shortness of breath.   Objective:    General: Well Developed, well nourished, and in no acute distress.  Neuro: Alert and oriented x3, extra-ocular muscles intact, sensation grossly intact.  HEENT: Normocephalic, atraumatic  Skin: Warm and dry, no rashes. Cardiac: Regular rate and rhythm, no murmurs rubs or gallops, no  lower extremity edema.  Respiratory: Clear to auscultation bilaterally. Not using accessory muscles, speaking in full sentences.   Impression and Recommendations:   Chronic pain management/ chronic back pain secondary to spinal stenosis -doing well on the 60 mg of Cymbalta in the morning and tramadol at night.  He has not filled it since December. Ok to take 2 ES tylenol or Tylenol Arthritis in the middle of the day.  This would actually be of great benefit.  Otherwise we will continue current regimen.  Urged him to consider water aerobics.  I think this would be really helpful for him.  PVD -we will continue to monitor.  Low back spasms-we will send over prescription for muscle relaxer to use only as needed when he gets the spasms.  But I do not want him taking it every day.  DM - has been eating sweets.  Work on diet changes.  F/U in April.    COPD -10 you with daily Incruse and as needed albuterol.

## 2017-09-09 DIAGNOSIS — D485 Neoplasm of uncertain behavior of skin: Secondary | ICD-10-CM | POA: Diagnosis not present

## 2017-09-09 DIAGNOSIS — L720 Epidermal cyst: Secondary | ICD-10-CM | POA: Diagnosis not present

## 2017-09-29 ENCOUNTER — Ambulatory Visit (INDEPENDENT_AMBULATORY_CARE_PROVIDER_SITE_OTHER): Payer: Medicare Other | Admitting: Ophthalmology

## 2017-09-29 DIAGNOSIS — I10 Essential (primary) hypertension: Secondary | ICD-10-CM | POA: Diagnosis not present

## 2017-09-29 DIAGNOSIS — H35033 Hypertensive retinopathy, bilateral: Secondary | ICD-10-CM

## 2017-09-29 DIAGNOSIS — E11319 Type 2 diabetes mellitus with unspecified diabetic retinopathy without macular edema: Secondary | ICD-10-CM

## 2017-09-29 DIAGNOSIS — H353134 Nonexudative age-related macular degeneration, bilateral, advanced atrophic with subfoveal involvement: Secondary | ICD-10-CM | POA: Diagnosis not present

## 2017-09-29 DIAGNOSIS — H43813 Vitreous degeneration, bilateral: Secondary | ICD-10-CM | POA: Diagnosis not present

## 2017-09-29 DIAGNOSIS — E113293 Type 2 diabetes mellitus with mild nonproliferative diabetic retinopathy without macular edema, bilateral: Secondary | ICD-10-CM

## 2017-09-29 HISTORY — DX: Type 2 diabetes mellitus with unspecified diabetic retinopathy without macular edema: E11.319

## 2017-09-29 LAB — HM DIABETES EYE EXAM

## 2017-10-06 ENCOUNTER — Ambulatory Visit (INDEPENDENT_AMBULATORY_CARE_PROVIDER_SITE_OTHER): Payer: Medicare Other | Admitting: Ophthalmology

## 2017-10-13 ENCOUNTER — Other Ambulatory Visit: Payer: Self-pay | Admitting: Family Medicine

## 2017-10-13 NOTE — Telephone Encounter (Signed)
Pt looked up in [place]. Last refilled medication 06/20/17 #90. Sent to pcp for review and signature.Elouise Munroe, [place]

## 2017-10-14 ENCOUNTER — Other Ambulatory Visit: Payer: Self-pay | Admitting: Family Medicine

## 2017-10-16 ENCOUNTER — Ambulatory Visit (INDEPENDENT_AMBULATORY_CARE_PROVIDER_SITE_OTHER): Payer: Medicare Other | Admitting: Family Medicine

## 2017-10-16 ENCOUNTER — Encounter: Payer: Self-pay | Admitting: Family Medicine

## 2017-10-16 VITALS — BP 138/74 | HR 64 | Ht 70.0 in | Wt 194.0 lb

## 2017-10-16 DIAGNOSIS — I10 Essential (primary) hypertension: Secondary | ICD-10-CM | POA: Diagnosis not present

## 2017-10-16 DIAGNOSIS — G8929 Other chronic pain: Secondary | ICD-10-CM

## 2017-10-16 DIAGNOSIS — M48062 Spinal stenosis, lumbar region with neurogenic claudication: Secondary | ICD-10-CM

## 2017-10-16 DIAGNOSIS — E1151 Type 2 diabetes mellitus with diabetic peripheral angiopathy without gangrene: Secondary | ICD-10-CM

## 2017-10-16 DIAGNOSIS — Z6831 Body mass index (BMI) 31.0-31.9, adult: Secondary | ICD-10-CM | POA: Diagnosis not present

## 2017-10-16 LAB — POCT GLYCOSYLATED HEMOGLOBIN (HGB A1C): Hemoglobin A1C: 6.7

## 2017-10-16 NOTE — Progress Notes (Signed)
Subjective:    CC: DM ck  HPI:  Diabetes - no hypoglycemic events. No wounds or sores that are not healing well. No increased thirst or urination. Checking glucose at home. Taking medications as prescribed without any side effects.  Hypertension- Pt denies chest pain, SOB, dizziness, or heart palpitations.  Taking meds as directed w/o problems.  Denies medication side effects.    Chronic pain management/ chronic back pain secondary to spinal stenosis -doing well on the 60 mg of Cymbalta in the morning and tramadol at nigh. Using tylenol too.  He would really like to come off of the tramadol if possible.  He says that his back is actually really been feeling much better.  It is mostly just the pain in his legs.  He is only using the tramadol at bedtime anyway.  He just wanted to make sure that he knew the best way to stop it.  Overweight  - He has lost about 20 lbs. he says he is cut back on portion sizes but he is still eating regularly.  He is not exercising at this point in time.  He is cut back on his ice cream intake.  He is been eating a small ball instead of a large bowl.  COPD- overall doing well.  Currently on Incruse.  Getting some occasional shortness of breath with activities but is also not very active and is not exercising regularly.  Past medical history, Surgical history, Family history not pertinant except as noted below, Social history, Allergies, and medications have been entered into the medical record, reviewed, and corrections made.   Review of Systems: No fevers, chills, night sweats, weight loss, chest pain, or shortness of breath.   Objective:    General: Well Developed, well nourished, and in no acute distress.  Neuro: Alert and oriented x3, extra-ocular muscles intact, sensation grossly intact.  HEENT: Normocephalic, atraumatic  Skin: Warm and dry, no rashes. Cardiac: Regular rate and rhythm, no murmurs rubs or gallops, no lower extremity edema.  Respiratory:  Clear to auscultation bilaterally. Not using accessory muscles, speaking in full sentences.   Impression and Recommendations:   Chronic pain management-we will discontinue tramadol for the next couple weeks and see how he does without it.  If he notices that his sleep quality is significantly affected because of his leg pain then okay to restart it.  For now just rely on the Cymbalta and Tylenol.  DM - well controlled.  Hemoglobin A1c up to 6.7.  Fantastic job on the weight loss but he admits he was eating a lot of sweets over the holidays including Valentines and Easter.  Just encouraged him to continue to work on cutting back on the sweets.  Follow-up in 3 months.  HTN - Well controlled. Continue current regimen. Follow up in  3-4 months.    Overweight/BMI 27-encouraged him to consider using his silver sneakers and getting to the gym to exercise more regularly.  He really cannot walk for long periods of time but he could do really well to do the stationary bike or even swimming.  I did congratulate him on the weight loss which is absolutely fantastic.  COPD- Stable. On Incruse.

## 2017-11-05 DIAGNOSIS — E119 Type 2 diabetes mellitus without complications: Secondary | ICD-10-CM | POA: Diagnosis not present

## 2017-11-12 ENCOUNTER — Encounter: Payer: Self-pay | Admitting: *Deleted

## 2017-11-18 ENCOUNTER — Encounter: Payer: Self-pay | Admitting: Family Medicine

## 2017-11-18 ENCOUNTER — Telehealth: Payer: Self-pay | Admitting: Family Medicine

## 2017-11-18 DIAGNOSIS — D179 Benign lipomatous neoplasm, unspecified: Secondary | ICD-10-CM

## 2017-11-18 DIAGNOSIS — L723 Sebaceous cyst: Secondary | ICD-10-CM

## 2017-11-18 NOTE — Telephone Encounter (Signed)
Referral has been placed. Wife advised via [place].

## 2017-11-18 NOTE — Telephone Encounter (Signed)
Patient is in bad pain with the cyst on his back at Spine area that [place] is aware of and wants a referral to Dr.Wakefield because he has seen [facility] before.  [phone] Please call patient wife As soon as possible at the number in this note

## 2017-11-20 DIAGNOSIS — D171 Benign lipomatous neoplasm of skin and subcutaneous tissue of trunk: Secondary | ICD-10-CM | POA: Diagnosis not present

## 2017-11-20 DIAGNOSIS — L723 Sebaceous cyst: Secondary | ICD-10-CM | POA: Diagnosis not present

## 2017-11-21 ENCOUNTER — Telehealth: Payer: Self-pay

## 2017-11-21 DIAGNOSIS — M4802 Spinal stenosis, cervical region: Secondary | ICD-10-CM

## 2017-11-21 DIAGNOSIS — M542 Cervicalgia: Secondary | ICD-10-CM

## 2017-11-21 NOTE — Telephone Encounter (Signed)
Order for MRI placed

## 2017-11-21 NOTE — Telephone Encounter (Signed)
Khristopher called and left a message stating the surgeon want Dr Madilyn Fireman to order an MRI. He states we should receive the request.

## 2017-11-24 ENCOUNTER — Ambulatory Visit (INDEPENDENT_AMBULATORY_CARE_PROVIDER_SITE_OTHER): Payer: Medicare Other

## 2017-11-24 ENCOUNTER — Telehealth: Payer: Self-pay | Admitting: *Deleted

## 2017-11-24 DIAGNOSIS — M542 Cervicalgia: Secondary | ICD-10-CM

## 2017-11-24 DIAGNOSIS — M4802 Spinal stenosis, cervical region: Secondary | ICD-10-CM | POA: Diagnosis not present

## 2017-11-24 DIAGNOSIS — M50223 Other cervical disc displacement at C6-C7 level: Secondary | ICD-10-CM | POA: Diagnosis not present

## 2017-11-24 NOTE — Telephone Encounter (Signed)
   [facility] Pre-operative Risk Assessment    Request for surgical clearance:  1. What type of surgery is being performed? Removal of lipoma   2. When is this surgery scheduled? TBD   3. What type of clearance is required (medical clearance vs. Pharmacy clearance to hold med vs. Both)? medical  4. Are there any medications that need to be held prior to surgery and how long?none   5. Practice name and name of physician performing surgery? Dr tsuel[facility]   6. What is your office phone number [phone]    7.   What is your office fax number [phone] attn- hadelyn [email or web address],lpn  8.   Anesthesia type (None, local, MAC, general) ? general   Clayton Harrison 11/24/2017, 3:36 PM  _________________________________________________________________   (provider comments below)

## 2017-11-24 NOTE — Telephone Encounter (Signed)
   Primary Cardiologist:Dr Crenshaw  Chart reviewed as part of pre-operative protocol coverage. Because of KALEP FULL past medical history and time since last visit, he/she will require a follow-up visit in order to better assess preoperative cardiovascular risk.  Pre-op covering staff: - Please schedule appointment and call patient to inform them. - Please contact requesting surgeon's office via preferred method (i.e, phone, fax) to inform them of need for appointment prior to surgery.  Kerin Ransom, PA-C  11/24/2017, 3:56 PM

## 2017-11-24 NOTE — Telephone Encounter (Signed)
Left patient a message advising him to contact office to schedule an appt.   Spoke with patient and he was not aware that he was having any kind of procedure. He stated he will speak with Dr Ahmed Prima office before scheduling an appt with our office.  Called Dr Tsuel's office made aware. And voiced understanding.

## 2017-11-25 ENCOUNTER — Telehealth: Payer: Self-pay

## 2017-11-25 NOTE — Telephone Encounter (Signed)
Pt called to give Dr Madilyn Fireman an Clayton Harrison that as of Monday, he will start seeing a chiropractor in efforts to help his back issues and avoid surgery,

## 2017-11-26 NOTE — Telephone Encounter (Signed)
OK 

## 2017-12-02 DIAGNOSIS — M4726 Other spondylosis with radiculopathy, lumbar region: Secondary | ICD-10-CM | POA: Diagnosis not present

## 2017-12-02 DIAGNOSIS — M9903 Segmental and somatic dysfunction of lumbar region: Secondary | ICD-10-CM | POA: Diagnosis not present

## 2017-12-02 DIAGNOSIS — M9901 Segmental and somatic dysfunction of cervical region: Secondary | ICD-10-CM | POA: Diagnosis not present

## 2017-12-02 DIAGNOSIS — M5412 Radiculopathy, cervical region: Secondary | ICD-10-CM | POA: Diagnosis not present

## 2017-12-03 DIAGNOSIS — M5412 Radiculopathy, cervical region: Secondary | ICD-10-CM | POA: Diagnosis not present

## 2017-12-03 DIAGNOSIS — M9903 Segmental and somatic dysfunction of lumbar region: Secondary | ICD-10-CM | POA: Diagnosis not present

## 2017-12-03 DIAGNOSIS — M4726 Other spondylosis with radiculopathy, lumbar region: Secondary | ICD-10-CM | POA: Diagnosis not present

## 2017-12-03 DIAGNOSIS — M9901 Segmental and somatic dysfunction of cervical region: Secondary | ICD-10-CM | POA: Diagnosis not present

## 2017-12-08 DIAGNOSIS — M4726 Other spondylosis with radiculopathy, lumbar region: Secondary | ICD-10-CM | POA: Diagnosis not present

## 2017-12-08 DIAGNOSIS — M9903 Segmental and somatic dysfunction of lumbar region: Secondary | ICD-10-CM | POA: Diagnosis not present

## 2017-12-08 DIAGNOSIS — M5412 Radiculopathy, cervical region: Secondary | ICD-10-CM | POA: Diagnosis not present

## 2017-12-08 DIAGNOSIS — M9901 Segmental and somatic dysfunction of cervical region: Secondary | ICD-10-CM | POA: Diagnosis not present

## 2017-12-09 DIAGNOSIS — M9903 Segmental and somatic dysfunction of lumbar region: Secondary | ICD-10-CM | POA: Diagnosis not present

## 2017-12-09 DIAGNOSIS — M4726 Other spondylosis with radiculopathy, lumbar region: Secondary | ICD-10-CM | POA: Diagnosis not present

## 2017-12-09 DIAGNOSIS — M9901 Segmental and somatic dysfunction of cervical region: Secondary | ICD-10-CM | POA: Diagnosis not present

## 2017-12-09 DIAGNOSIS — M5412 Radiculopathy, cervical region: Secondary | ICD-10-CM | POA: Diagnosis not present

## 2017-12-11 DIAGNOSIS — M9903 Segmental and somatic dysfunction of lumbar region: Secondary | ICD-10-CM | POA: Diagnosis not present

## 2017-12-11 DIAGNOSIS — M4726 Other spondylosis with radiculopathy, lumbar region: Secondary | ICD-10-CM | POA: Diagnosis not present

## 2017-12-11 DIAGNOSIS — M5412 Radiculopathy, cervical region: Secondary | ICD-10-CM | POA: Diagnosis not present

## 2017-12-11 DIAGNOSIS — M9901 Segmental and somatic dysfunction of cervical region: Secondary | ICD-10-CM | POA: Diagnosis not present

## 2017-12-15 DIAGNOSIS — M4726 Other spondylosis with radiculopathy, lumbar region: Secondary | ICD-10-CM | POA: Diagnosis not present

## 2017-12-15 DIAGNOSIS — M5412 Radiculopathy, cervical region: Secondary | ICD-10-CM | POA: Diagnosis not present

## 2017-12-15 DIAGNOSIS — M9901 Segmental and somatic dysfunction of cervical region: Secondary | ICD-10-CM | POA: Diagnosis not present

## 2017-12-15 DIAGNOSIS — M9903 Segmental and somatic dysfunction of lumbar region: Secondary | ICD-10-CM | POA: Diagnosis not present

## 2017-12-16 ENCOUNTER — Other Ambulatory Visit: Payer: Self-pay | Admitting: Family Medicine

## 2017-12-17 DIAGNOSIS — M9903 Segmental and somatic dysfunction of lumbar region: Secondary | ICD-10-CM | POA: Diagnosis not present

## 2017-12-17 DIAGNOSIS — M9901 Segmental and somatic dysfunction of cervical region: Secondary | ICD-10-CM | POA: Diagnosis not present

## 2017-12-17 DIAGNOSIS — M4726 Other spondylosis with radiculopathy, lumbar region: Secondary | ICD-10-CM | POA: Diagnosis not present

## 2017-12-17 DIAGNOSIS — M5412 Radiculopathy, cervical region: Secondary | ICD-10-CM | POA: Diagnosis not present

## 2017-12-29 DIAGNOSIS — M9901 Segmental and somatic dysfunction of cervical region: Secondary | ICD-10-CM | POA: Diagnosis not present

## 2017-12-29 DIAGNOSIS — M5412 Radiculopathy, cervical region: Secondary | ICD-10-CM | POA: Diagnosis not present

## 2017-12-29 DIAGNOSIS — M9903 Segmental and somatic dysfunction of lumbar region: Secondary | ICD-10-CM | POA: Diagnosis not present

## 2017-12-29 DIAGNOSIS — M4726 Other spondylosis with radiculopathy, lumbar region: Secondary | ICD-10-CM | POA: Diagnosis not present

## 2017-12-31 DIAGNOSIS — E119 Type 2 diabetes mellitus without complications: Secondary | ICD-10-CM | POA: Diagnosis not present

## 2017-12-31 DIAGNOSIS — E113292 Type 2 diabetes mellitus with mild nonproliferative diabetic retinopathy without macular edema, left eye: Secondary | ICD-10-CM | POA: Diagnosis not present

## 2017-12-31 DIAGNOSIS — H353132 Nonexudative age-related macular degeneration, bilateral, intermediate dry stage: Secondary | ICD-10-CM | POA: Diagnosis not present

## 2017-12-31 DIAGNOSIS — H35033 Hypertensive retinopathy, bilateral: Secondary | ICD-10-CM | POA: Diagnosis not present

## 2017-12-31 LAB — HM DIABETES EYE EXAM

## 2018-01-01 DIAGNOSIS — M9901 Segmental and somatic dysfunction of cervical region: Secondary | ICD-10-CM | POA: Diagnosis not present

## 2018-01-01 DIAGNOSIS — M4726 Other spondylosis with radiculopathy, lumbar region: Secondary | ICD-10-CM | POA: Diagnosis not present

## 2018-01-01 DIAGNOSIS — M9903 Segmental and somatic dysfunction of lumbar region: Secondary | ICD-10-CM | POA: Diagnosis not present

## 2018-01-01 DIAGNOSIS — M5412 Radiculopathy, cervical region: Secondary | ICD-10-CM | POA: Diagnosis not present

## 2018-01-05 DIAGNOSIS — M4726 Other spondylosis with radiculopathy, lumbar region: Secondary | ICD-10-CM | POA: Diagnosis not present

## 2018-01-05 DIAGNOSIS — M9903 Segmental and somatic dysfunction of lumbar region: Secondary | ICD-10-CM | POA: Diagnosis not present

## 2018-01-05 DIAGNOSIS — M5412 Radiculopathy, cervical region: Secondary | ICD-10-CM | POA: Diagnosis not present

## 2018-01-05 DIAGNOSIS — M9901 Segmental and somatic dysfunction of cervical region: Secondary | ICD-10-CM | POA: Diagnosis not present

## 2018-01-06 ENCOUNTER — Other Ambulatory Visit: Payer: Self-pay | Admitting: Family Medicine

## 2018-01-12 DIAGNOSIS — M9901 Segmental and somatic dysfunction of cervical region: Secondary | ICD-10-CM | POA: Diagnosis not present

## 2018-01-12 DIAGNOSIS — M4726 Other spondylosis with radiculopathy, lumbar region: Secondary | ICD-10-CM | POA: Diagnosis not present

## 2018-01-12 DIAGNOSIS — M9903 Segmental and somatic dysfunction of lumbar region: Secondary | ICD-10-CM | POA: Diagnosis not present

## 2018-01-12 DIAGNOSIS — M5412 Radiculopathy, cervical region: Secondary | ICD-10-CM | POA: Diagnosis not present

## 2018-01-13 ENCOUNTER — Other Ambulatory Visit: Payer: Self-pay

## 2018-01-13 MED ORDER — ALBUTEROL SULFATE HFA 108 (90 BASE) MCG/ACT IN AERS: 2.0000 | INHALATION_SPRAY | Freq: Four times a day (QID) | RESPIRATORY_TRACT | 1 refills | Status: DC | PRN

## 2018-01-15 ENCOUNTER — Ambulatory Visit (INDEPENDENT_AMBULATORY_CARE_PROVIDER_SITE_OTHER): Payer: Medicare Other | Admitting: Family Medicine

## 2018-01-15 ENCOUNTER — Encounter: Payer: Self-pay | Admitting: Family Medicine

## 2018-01-15 VITALS — BP 144/68 | HR 66 | Ht 70.0 in | Wt 219.0 lb

## 2018-01-15 DIAGNOSIS — Z6831 Body mass index (BMI) 31.0-31.9, adult: Secondary | ICD-10-CM

## 2018-01-15 DIAGNOSIS — E1151 Type 2 diabetes mellitus with diabetic peripheral angiopathy without gangrene: Secondary | ICD-10-CM

## 2018-01-15 DIAGNOSIS — R252 Cramp and spasm: Secondary | ICD-10-CM

## 2018-01-15 DIAGNOSIS — R635 Abnormal weight gain: Secondary | ICD-10-CM | POA: Diagnosis not present

## 2018-01-15 DIAGNOSIS — I10 Essential (primary) hypertension: Secondary | ICD-10-CM | POA: Diagnosis not present

## 2018-01-15 LAB — POCT GLYCOSYLATED HEMOGLOBIN (HGB A1C): Hemoglobin A1C: 7.3 % — AB (ref 4.0–5.6)

## 2018-01-15 NOTE — Progress Notes (Signed)
Subjective:    CC: DM, HTN   HPI:  Diabetes - no hypoglycemic events. No wounds or sores that are not healing well. No increased thirst or urination. Checking glucose at home. CBG 150-170s.  Taking medications as prescribed without any side effects.  He admits he is been overeating and just does not seem to feel like he has good control over that.  Hypertension- Pt denies chest pain, SOB, dizziness, or heart palpitations.  Taking meds as directed w/o problems.  Denies medication side effects.    Abnormal weight gain-he is actually gained about 25 pounds since I last saw him in April.  He is actually lost a fair amount of weight when I saw him in April and was doing better but more recently is just been eating what he wants that he has not started silver sneakers and has not been exercising.  He has been having some muscle cramps. Says he has increased his water intake and that has helped. He is very inactive.    Past medical history, Surgical history, Family history not pertinant except as noted below, Social history, Allergies, and medications have been entered into the medical record, reviewed, and corrections made.   Review of Systems: No fevers, chills, night sweats, weight loss, chest pain, or shortness of breath.   Objective:    General: Well Developed, well nourished, and in no acute distress.  Neuro: Alert and oriented x3, extra-ocular muscles intact, sensation grossly intact.  HEENT: Normocephalic, atraumatic  Skin: Warm and dry, no rashes. Cardiac: Regular rate and rhythm, no murmurs rubs or gallops, no lower extremity edema.  Respiratory: Clear to auscultation bilaterally. Not using accessory muscles, speaking in full sentences.   Impression and Recommendations:    DM -uncontrolled.  Hemoglobin A1c is elevated today.  Will increase Toujeo to 38 units and have him really focusing on diet and exercise.  HTN -BP mildly elevated today.  But he is also gained a significant  amount of weight back which I suspect is contributing.  Encouraged him to get back on track and will check this again in about 3 months.  Also encouraged him to hydrate a little better since his diastolic pressure is little low today.  Abnormal weight gain/BMI 31 -discussed the importance of getting back on track with portion control and healthy diet in addition to joining Silver sneakers and getting active.  Muscle cramps. Will check your potassium.

## 2018-01-15 NOTE — Patient Instructions (Signed)
Increase Toujeo to 38 units for a few weeks.  If you feel like you are getting her diet under better control your blood sugars look really good and you can decrease down by 2 units and stay at that for a while to see if her blood sugars still look good.  If on the 38 units after couple weeks or still seeing blood sugars greater than 140 been increased to 40 units.

## 2018-01-16 LAB — BASIC METABOLIC PANEL WITH GFR
BUN/Creatinine Ratio: 17 (calc) (ref 6–22)
BUN: 23 mg/dL (ref 7–25)
CO2: 27 mmol/L (ref 20–32)
Calcium: 9.8 mg/dL (ref 8.6–10.3)
Chloride: 99 mmol/L (ref 98–110)
Creat: 1.39 mg/dL — ABNORMAL HIGH (ref 0.70–1.11)
GFR, Est African American: 54 mL/min/{1.73_m2} — ABNORMAL LOW (ref >=60)
GFR, Est Non African American: 46 mL/min/{1.73_m2} — ABNORMAL LOW (ref >=60)
Glucose, Bld: 241 mg/dL — ABNORMAL HIGH (ref 65–99)
Potassium: 4.8 mmol/L (ref 3.5–5.3)
Sodium: 135 mmol/L (ref 135–146)

## 2018-01-19 MED ORDER — ALBUTEROL SULFATE 108 (90 BASE) MCG/ACT IN AEPB: 2.0000 | INHALATION_SPRAY | Freq: Four times a day (QID) | RESPIRATORY_TRACT | 1 refills | Status: DC | PRN

## 2018-01-19 NOTE — Addendum Note (Signed)
Addended by: Dessie Coma on: 01/19/2018 01:25 PM   Modules accepted: Orders

## 2018-02-16 ENCOUNTER — Other Ambulatory Visit: Payer: Self-pay | Admitting: *Deleted

## 2018-02-16 DIAGNOSIS — I6529 Occlusion and stenosis of unspecified carotid artery: Secondary | ICD-10-CM

## 2018-03-02 ENCOUNTER — Other Ambulatory Visit: Payer: Self-pay | Admitting: Family Medicine

## 2018-03-09 ENCOUNTER — Ambulatory Visit (HOSPITAL_COMMUNITY)
Admission: RE | Admit: 2018-03-09 | Discharge: 2018-03-09 | Disposition: A | Discharge status: Home / Self Care | Payer: Medicare Other | Source: Ambulatory Visit | Attending: Internal Medicine | Admitting: Internal Medicine

## 2018-03-09 DIAGNOSIS — I6529 Occlusion and stenosis of unspecified carotid artery: Secondary | ICD-10-CM | POA: Diagnosis not present

## 2018-03-10 ENCOUNTER — Other Ambulatory Visit: Payer: Self-pay | Admitting: *Deleted

## 2018-03-10 DIAGNOSIS — I6529 Occlusion and stenosis of unspecified carotid artery: Secondary | ICD-10-CM

## 2018-03-11 ENCOUNTER — Other Ambulatory Visit: Payer: Self-pay | Admitting: *Deleted

## 2018-03-11 DIAGNOSIS — E1151 Type 2 diabetes mellitus with diabetic peripheral angiopathy without gangrene: Secondary | ICD-10-CM

## 2018-03-11 DIAGNOSIS — E119 Type 2 diabetes mellitus without complications: Secondary | ICD-10-CM | POA: Diagnosis not present

## 2018-03-11 MED ORDER — INSULIN GLARGINE 300 UNIT/ML ~~LOC~~ SOPN: 34.0000 [IU] | PEN_INJECTOR | Freq: Every day | SUBCUTANEOUS | 6 refills | Status: DC

## 2018-03-31 ENCOUNTER — Other Ambulatory Visit: Payer: Self-pay | Admitting: Family Medicine

## 2018-04-09 DIAGNOSIS — H26493 Other secondary cataract, bilateral: Secondary | ICD-10-CM | POA: Diagnosis not present

## 2018-04-09 DIAGNOSIS — H353134 Nonexudative age-related macular degeneration, bilateral, advanced atrophic with subfoveal involvement: Secondary | ICD-10-CM | POA: Diagnosis not present

## 2018-04-09 DIAGNOSIS — H26491 Other secondary cataract, right eye: Secondary | ICD-10-CM | POA: Diagnosis not present

## 2018-04-09 DIAGNOSIS — H35033 Hypertensive retinopathy, bilateral: Secondary | ICD-10-CM | POA: Diagnosis not present

## 2018-04-09 DIAGNOSIS — E113293 Type 2 diabetes mellitus with mild nonproliferative diabetic retinopathy without macular edema, bilateral: Secondary | ICD-10-CM | POA: Diagnosis not present

## 2018-04-13 ENCOUNTER — Encounter (INDEPENDENT_AMBULATORY_CARE_PROVIDER_SITE_OTHER): Payer: Medicare Other | Admitting: Ophthalmology

## 2018-04-13 DIAGNOSIS — H353134 Nonexudative age-related macular degeneration, bilateral, advanced atrophic with subfoveal involvement: Secondary | ICD-10-CM | POA: Diagnosis not present

## 2018-04-13 DIAGNOSIS — H35033 Hypertensive retinopathy, bilateral: Secondary | ICD-10-CM

## 2018-04-13 DIAGNOSIS — H43813 Vitreous degeneration, bilateral: Secondary | ICD-10-CM

## 2018-04-13 DIAGNOSIS — I10 Essential (primary) hypertension: Secondary | ICD-10-CM | POA: Diagnosis not present

## 2018-04-21 ENCOUNTER — Ambulatory Visit: Payer: Medicare Other | Admitting: Family Medicine

## 2018-04-30 ENCOUNTER — Encounter: Payer: Self-pay | Admitting: Family Medicine

## 2018-04-30 ENCOUNTER — Ambulatory Visit (INDEPENDENT_AMBULATORY_CARE_PROVIDER_SITE_OTHER): Payer: Medicare Other | Admitting: Family Medicine

## 2018-04-30 VITALS — BP 128/78 | HR 66 | Ht 66.93 in | Wt 217.0 lb

## 2018-04-30 DIAGNOSIS — E1151 Type 2 diabetes mellitus with diabetic peripheral angiopathy without gangrene: Secondary | ICD-10-CM

## 2018-04-30 DIAGNOSIS — R29898 Other symptoms and signs involving the musculoskeletal system: Secondary | ICD-10-CM | POA: Diagnosis not present

## 2018-04-30 DIAGNOSIS — Z23 Encounter for immunization: Secondary | ICD-10-CM | POA: Diagnosis not present

## 2018-04-30 DIAGNOSIS — I10 Essential (primary) hypertension: Secondary | ICD-10-CM | POA: Diagnosis not present

## 2018-04-30 LAB — POCT GLYCOSYLATED HEMOGLOBIN (HGB A1C): Hemoglobin A1C: 6.6 % — AB (ref 4.0–5.6)

## 2018-04-30 NOTE — Progress Notes (Signed)
Subjective:    CC: DM HPI:  Diabetes - no hypoglycemic events. No wounds or sores that are not healing well. No increased thirst or urination. Checking glucose at home. Taking medications as prescribed without any side effects.  Hypertension- Pt denies chest pain, SOB, dizziness, or heart palpitations.  Taking meds as directed w/o problems.  Denies medication side effects.    Feels like getting weak in his legs.  He is not as active as he used to be.  Sits a lot. He has chronic back pain from spinal stenosis. He is seeing a chiropractor regularly and that has really helped him. Rarely taking medication.     Past medical history, Surgical history, Family history not pertinant except as noted below, Social history, Allergies, and medications have been entered into the medical record, reviewed, and corrections made.   Review of Systems: No fevers, chills, night sweats, weight loss, chest pain, or shortness of breath.   Objective:    General: Well Developed, well nourished, and in no acute distress.  Neuro: Alert and oriented x3, extra-ocular muscles intact, sensation grossly intact.  HEENT: Normocephalic, atraumatic  Skin: Warm and dry, no rashes. Cardiac: Regular rate and rhythm, no murmurs rubs or gallops, no lower extremity edema.  Respiratory: Clear to auscultation bilaterally. Not using accessory muscles, speaking in full sentences. MSK: hip flexion is 5/5. Knee strength flexion and extension is 4-5/5. Foot flexion is 5/5 and extension is 4-5/5.  Patellar reflexes 0+ bilaterally.     Impression and Recommendations:    DM -hemoglobin A1c back down to 6.6, much improved from previous.  He is up to  are -38 units on his Toujeo  HTN - Well controlled. Continue current regimen. Follow up in  6 momths.    LE weakness - likely from his spinal stenosis. Will refer to sports med when he is ready.  May benefit from PT.  Consider EMG workup if worsening or weakness progresses.

## 2018-05-04 ENCOUNTER — Other Ambulatory Visit: Payer: Self-pay | Admitting: *Deleted

## 2018-05-04 MED ORDER — UMECLIDINIUM BROMIDE 62.5 MCG/INH IN AEPB: 1.0000 | INHALATION_SPRAY | Freq: Every day | RESPIRATORY_TRACT | 3 refills | Status: DC

## 2018-05-14 DIAGNOSIS — E1151 Type 2 diabetes mellitus with diabetic peripheral angiopathy without gangrene: Secondary | ICD-10-CM | POA: Diagnosis not present

## 2018-05-14 DIAGNOSIS — I10 Essential (primary) hypertension: Secondary | ICD-10-CM | POA: Diagnosis not present

## 2018-05-14 LAB — COMPLETE METABOLIC PANEL WITH GFR
AG Ratio: 1.5 (calc) (ref 1.0–2.5)
ALT: 17 U/L (ref 9–46)
AST: 21 U/L (ref 10–35)
Albumin: 4.2 g/dL (ref 3.6–5.1)
Alkaline phosphatase (APISO): 60 U/L (ref 40–115)
BUN/Creatinine Ratio: 13 (calc) (ref 6–22)
BUN: 17 mg/dL (ref 7–25)
CO2: 26 mmol/L (ref 20–32)
Calcium: 9.4 mg/dL (ref 8.6–10.3)
Chloride: 105 mmol/L (ref 98–110)
Creat: 1.3 mg/dL — ABNORMAL HIGH (ref 0.70–1.11)
GFR, Est African American: 58 mL/min/{1.73_m2} — ABNORMAL LOW (ref >=60)
GFR, Est Non African American: 50 mL/min/{1.73_m2} — ABNORMAL LOW (ref >=60)
Globulin: 2.8 g/dL (calc) (ref 1.9–3.7)
Glucose, Bld: 106 mg/dL — ABNORMAL HIGH (ref 65–99)
Potassium: 4.6 mmol/L (ref 3.5–5.3)
Sodium: 140 mmol/L (ref 135–146)
Total Bilirubin: 0.5 mg/dL (ref 0.2–1.2)
Total Protein: 7 g/dL (ref 6.1–8.1)

## 2018-05-14 LAB — LIPID PANEL
Cholesterol: 185 mg/dL (ref <200)
HDL: 42 mg/dL (ref >40)
LDL Cholesterol (Calc): 109 mg/dL (calc) — ABNORMAL HIGH
Non-HDL Cholesterol (Calc): 143 mg/dL (calc) — ABNORMAL HIGH (ref <130)
Total CHOL/HDL Ratio: 4.4 (calc) (ref <5.0)
Triglycerides: 215 mg/dL — ABNORMAL HIGH (ref <150)

## 2018-06-30 ENCOUNTER — Ambulatory Visit (INDEPENDENT_AMBULATORY_CARE_PROVIDER_SITE_OTHER): Payer: Medicare Other | Admitting: Family Medicine

## 2018-06-30 ENCOUNTER — Encounter: Payer: Self-pay | Admitting: Family Medicine

## 2018-06-30 VITALS — BP 142/57 | HR 66 | Ht 67.0 in | Wt 220.0 lb

## 2018-06-30 DIAGNOSIS — R42 Dizziness and giddiness: Secondary | ICD-10-CM | POA: Diagnosis not present

## 2018-06-30 DIAGNOSIS — I739 Peripheral vascular disease, unspecified: Secondary | ICD-10-CM

## 2018-06-30 DIAGNOSIS — L723 Sebaceous cyst: Secondary | ICD-10-CM

## 2018-06-30 DIAGNOSIS — R002 Palpitations: Secondary | ICD-10-CM

## 2018-06-30 DIAGNOSIS — I10 Essential (primary) hypertension: Secondary | ICD-10-CM

## 2018-06-30 DIAGNOSIS — I2510 Atherosclerotic heart disease of native coronary artery without angina pectoris: Secondary | ICD-10-CM | POA: Diagnosis not present

## 2018-06-30 DIAGNOSIS — B009 Herpesviral infection, unspecified: Secondary | ICD-10-CM | POA: Insufficient documentation

## 2018-06-30 DIAGNOSIS — H3530 Unspecified macular degeneration: Secondary | ICD-10-CM

## 2018-06-30 MED ORDER — VALACYCLOVIR HCL 1 G PO TABS: 500.0000 mg | ORAL_TABLET | Freq: Two times a day (BID) | ORAL | 0 refills | Status: DC

## 2018-06-30 NOTE — Progress Notes (Signed)
Subjective:    CC: "Dizziness"   HPI:  85 year old male comes in today complaining of some intermittent dizziness.  He really notices it when he changes positions or moves quickly. It only last a few seconds.  No vertigo.   It does not really happen when he is at rest.  He notices sometimes when he bends forward and then stands back up.  He wonders if it could be a medication side effect.  He also wants to discuss the cyst on his left upper back.  It has been there for years but more recently he feels like it is been causing some upper back pain.  He had a manipulation by the chiropractor and says he got some temporary relief and wonders if having the cyst removed would help with the "pinched nerve".  His dermatologist, Dr. Allyson Sabal is retiring and he wanted to know if I would take over his prescription for valacyclovir.  He also wanted to let me know he saw his eye doctor and was recently diagnosed with macular degeneration.  Unfortunately there is no intervention that they can do at this point in time he has significant visual loss in his right eye.  He also reports that for several months he has been waking up with palpitations.  He says it happens maybe 2-3 times per month he will wake up just feeling like his heart is racing.  He said he just flies for a few minutes and then eases off.  It never happens during the daytime he read.  He denies feeling particularly anxious or worried.  No chest pain when this occurs.  Past medical history, Surgical history, Family history not pertinant except as noted below, Social history, Allergies, and medications have been entered into the medical record, reviewed, and corrections made.   Review of Systems: No fevers, chills, night sweats, weight loss, chest pain, or shortness of breath.   Objective:    General: Well Developed, well nourished, and in no acute distress.  Neuro: Alert and oriented x3, extra-ocular muscles intact, sensation grossly intact.   HEENT: Normocephalic, atraumatic  Skin: Warm and dry, no rashes. He has 2 very large seb cyst on the right side of his back.  Cardiac: Regular rate and rhythm, no murmurs rubs or gallops, no lower extremity edema.  Respiratory: Clear to auscultation bilaterally. Not using accessory muscles, speaking in full sentences.    Impression and Recommendations:    Dizziness- BP actually a little elevated.  Asked him to check his BP at home dialy for 2 weeks and call with those numbers. No orthostasis.  NO new medication. Take time getting up to be safe.   Palpitations-I asked him to check his blood pressure and pulse when this happens.  Is been occurring maybe a couple times a month.  If his pulse is greater than 100 then please call me back in him to schedule him for a Holter monitor.  Sebaceous cyst-discussed that he certainly could have this removed it can get larger over time but I do not think it is necessarily gone to help his pain in his back by having it removed.  Certainly if it is becoming more bothersome or irritated or sore and he might really want to consider having it was excised.   CAD/PVD - Stable. On satin daily.   Herpes viral infection-we will send over prescription for valacyclovir.

## 2018-07-03 ENCOUNTER — Other Ambulatory Visit: Payer: Self-pay

## 2018-07-03 ENCOUNTER — Other Ambulatory Visit: Payer: Self-pay | Admitting: Family Medicine

## 2018-07-03 MED ORDER — INSULIN PEN NEEDLE 32G X 4 MM MISC: 1 refills | Status: DC

## 2018-07-16 ENCOUNTER — Other Ambulatory Visit: Payer: Self-pay | Admitting: Family Medicine

## 2018-08-06 ENCOUNTER — Other Ambulatory Visit: Payer: Self-pay | Admitting: Family Medicine

## 2018-08-06 DIAGNOSIS — B009 Herpesviral infection, unspecified: Secondary | ICD-10-CM

## 2018-08-11 ENCOUNTER — Ambulatory Visit (INDEPENDENT_AMBULATORY_CARE_PROVIDER_SITE_OTHER): Payer: Medicare Other | Admitting: Family Medicine

## 2018-08-11 ENCOUNTER — Encounter: Payer: Self-pay | Admitting: Family Medicine

## 2018-08-11 VITALS — BP 136/78 | HR 69 | Ht 67.0 in | Wt 216.0 lb

## 2018-08-11 DIAGNOSIS — I10 Essential (primary) hypertension: Secondary | ICD-10-CM | POA: Diagnosis not present

## 2018-08-11 DIAGNOSIS — E1151 Type 2 diabetes mellitus with diabetic peripheral angiopathy without gangrene: Secondary | ICD-10-CM

## 2018-08-11 DIAGNOSIS — R42 Dizziness and giddiness: Secondary | ICD-10-CM | POA: Diagnosis not present

## 2018-08-11 DIAGNOSIS — R002 Palpitations: Secondary | ICD-10-CM | POA: Diagnosis not present

## 2018-08-11 LAB — POCT GLYCOSYLATED HEMOGLOBIN (HGB A1C): Hemoglobin A1C: 6.9 % — AB (ref 4.0–5.6)

## 2018-08-11 NOTE — Patient Instructions (Signed)
Ok to try holding one of your BP medicines, one at a time, for 10 days and see if you feel better in regards to the dizziness.  Can start with the metoprolol

## 2018-08-11 NOTE — Progress Notes (Signed)
Subjective:    CC: F/U DM   HPI:  Diabetes - no hypoglycemic events. No wounds or sores that are not healing well. No increased thirst or urination. Checking glucose at home. Taking medications as prescribed without any side effects. He has been eating more candy lately.    Hypertension- Pt denies chest pain, SOB, dizziness, or heart palpitations.  Taking meds as directed w/o problems.  Denies medication side effects.    Also complained of some palpitations when I last saw him I encouraged him to check his pulse when I have him.  He says it has happened a couple times since he last saw me and he was unable to actually completely count his pulse.  He says it went away before he did counted help.  He notices that it happens more in the morning when he first wakes up before he really gets out of bed.  It does not seem to be triggered necessarily with activity.  Denies any chest pain when it happens.  He still having episodes of dizziness.  His wife says he complains more about it when he has bent over to make the bed or if he is looking up to get something out of the cabinet.  They are concerned that it could be coming from 1 of his medications as he is on at least 6 medications that can cause dizziness and is listed as a side effect.  He says it is usually short-lived and brief but he also does not want to fall or get injured because of it.  Denies any chest pain when it happens.  Also asked him to check his blood pressure when this occurs and he says it is been pretty normal pretty similar to what we get here.  Past medical history, Surgical history, Family history not pertinant except as noted below, Social history, Allergies, and medications have been entered into the medical record, reviewed, and corrections made.   Review of Systems: No fevers, chills, night sweats, weight loss, chest pain, or shortness of breath.   Objective:    General: Well Developed, well nourished, and in no acute  distress.  Neuro: Alert and oriented x3, extra-ocular muscles intact, sensation grossly intact.  HEENT: Normocephalic, atraumatic  Skin: Warm and dry, no rashes. Cardiac: Regular rate and rhythm, no murmurs rubs or gallops, no lower extremity edema. RAdila pulse 2+  Respiratory: Clear to auscultation bilaterally. Not using accessory muscles, speaking in full sentences.   Impression and Recommendations:    DM -encouraged him to cut back on candy.  A1c is up from previous is now 6.9 today encouraged him to get back on track.  Palpitations s-discussed that we would need to consider a event monitor to further evaluate the palpitations.  He does have coronary artery disease but says he does not experience any chest pain with it.  He says he will think about it and let me know.  Dizziness-it sounds like it somewhat positional.  It is occurring more when he bends over to make the bed or when he reaches up to do something but not always.  So I think it is not unreasonable to one by one hold each blood pressure pill individually.  He could start with metoprolol and hold for 10 days.  If he notices no significant change in his symptoms then restart the medication and then hold the lisinopril and so on.  If it is none of the blood pressure medications then we can certainly look at  some of the other agents such as the Cymbalta which can also cause dizziness but explained that that one cannot be stopped it has to be tapered.  Also consider vestibular rehab this may be helpful as well.

## 2018-08-12 NOTE — Progress Notes (Deleted)
Subjective:   BARAKA KLATT is a 85 y.o. male who presents for Medicare Annual/Subsequent preventive examination.  Review of Systems:  No ROS.  Medicare Wellness Visit. Additional risk factors are reflected in the social history.     Sleep patterns:    Home Safety/Smoke Alarms: Feels safe in home. Smoke alarms in place.  Living environment; Seat Belt Safety/Bike Helmet: Wears seat belt.  Male:   CCS-   Aged out  PSA-  Lab Results  Component Value Date   PSA 8.30 (H) 10/15/2012        Objective:    Vitals: There were no vitals taken for this visit.  There is no height or weight on file to calculate BMI.  Advanced Directives 10/12/2014 08/01/2014 06/21/2014 04/19/2014 08/07/2013  Does Patient Have a Medical Advance Directive? Yes Yes Yes Yes Patient has advance directive, copy not in chart  Type of Advance Directive [place];Living will [place];Living will [place];Living will - Living will;Healthcare Power of [place] in Chart? No - copy requested No - copy requested Yes - -  Pre-existing out of facility DNR order (yellow form or pink MOST form) - - - - No    Tobacco Social History   Tobacco Use  Smoking Status Former Smoker  . Packs/day: 2.00  . Years: 40.00  . Pack years: 80.00  . Last attempt to quit: 11/23/1999  . Years since quitting: 18.7  Smokeless Tobacco Current User  . Types: Chew     Ready to quit: Not Answered Counseling given: Not Answered   Clinical Intake:                       Past Medical History:  Diagnosis Date  . Adenomatous colon polyp    2.5cm excised by right colectomy 2011  . Anemia, blood loss    history of  . Blockage of coronary artery of heart ([place]) 2001   x 4  . CAD (coronary artery disease)   . Carotid artery occlusion   . Cataract   . Cerebrovascular disease, unspecified   . Colon tumor 03/2010   right ascending   . Diabetic retinopathy ([place]) 09/29/2017  . DM (diabetes mellitus) ([place])   . Elevated PSA   . GERD (gastroesophageal reflux disease)   . Hearing loss   . HTN (hypertension)   . Hypercholesteremia   . Interstitial cystitis   . Macular degeneration   . Other and unspecified hyperlipidemia   . Prostate cancer ([place]) 03/09/14   Gleason 4+5=9, volume 98 mL  . PVD (peripheral vascular disease) ([place])   . S/P radiation therapy 07/20/2014 through 09/13/2014    Prostate 7800 cGy in 40 sessions, seminal vesicles 5600 cGy in 40 sessions   . SBO (small bowel obstruction) ([place]) 07/2013  . Skin cancer 2012   melanoma upper back, removed  . Statin intolerance   . Vertigo   . Vertigo    Past Surgical History:  Procedure Laterality Date  . APPENDECTOMY  05/2010   with right hemicolectomy  . CARDIAC CATHETERIZATION  11/1999  . CAROTID ENDARTERECTOMY  2012   right  . CORONARY ARTERY BYPASS GRAFT  2001   4  . dental extractions Bilateral 1980  . DENTAL SURGERY    . [place]  . left carpal tunnel release  2009  . PARTIAL COLECTOMY  05/24/2010   Lap converted to  open right proximal colectomy  . PROSTATE BIOPSY  03/09/14   Gleason 9, vol 98 mL  . SKIN CANCER EXCISION  02/2011   upper back  . TONSILLECTOMY  1940  . URETEROLITHOTOMY     Family History  Problem Relation Age of Onset  . Diabetes Mother   . Heart disease Mother   . Heart failure Mother   . Hypertension Mother   . Heart disease Father   . Cancer Cousin        colon  . Colon cancer Maternal Uncle        x4  . Cancer Maternal Uncle        colon  . Cancer Daughter        breast  . Cancer Daughter        breast   Social History   Socioeconomic History  . Marital status: Married    Spouse name: [name]  . Number of children: 2  . Years of education: HS  . Highest education level: Not on file  Occupational History  . Occupation:  Retired  Scientific laboratory technician  . Financial resource strain: Not on file  . Food insecurity:    Worry: Not on file    Inability: Not on file  . Transportation needs:    Medical: Not on file    Non-medical: Not on file  Tobacco Use  . Smoking status: Former Smoker    Packs/day: 2.00    Years: 40.00    Pack years: 80.00    Last attempt to quit: 11/23/1999    Years since quitting: 18.7  . Smokeless tobacco: Current User    Types: Chew  Substance and Sexual Activity  . Alcohol use: No  . Drug use: No  . Sexual activity: Yes    Partners: Female  Lifestyle  . Physical activity:    Days per week: Not on file    Minutes per session: Not on file  . Stress: Not on file  Relationships  . Social connections:    Talks on phone: Not on file    Gets together: Not on file    Attends religious service: Not on file    Active member of club or organization: Not on file    Attends meetings of clubs or organizations: Not on file    Relationship status: Not on file  Other Topics Concern  . Not on file  Social History Narrative   No regular exercise. Daily caffeine- 3-4 cups daily          Outpatient Encounter Medications as of 08/18/2018  Medication Sig  . acetaminophen (TYLENOL) 500 MG tablet Take 1,000 mg by mouth every 6 (six) hours as needed for mild pain.  . Albuterol Sulfate (PROAIR RESPICLICK) 086 (90 Base) MCG/ACT AEPB Inhale 2 puffs into the lungs every 6 (six) hours as needed.  Marland Kitchen amLODipine (NORVASC) 10 MG tablet TAKE 1 TABLET BY MOUTH  DAILY  . aspirin 325 MG tablet Take 325 mg by mouth daily.    . DULoxetine (CYMBALTA) 60 MG capsule TAKE 1 CAPSULE BY MOUTH  DAILY  . glipiZIDE (GLUCOTROL) 10 MG tablet TAKE 1 TABLET BY MOUTH  TWICE A DAY BEFORE MEALS  . Insulin Glargine (TOUJEO SOLOSTAR) 300 UNIT/ML SOPN Inject 34 Units into the skin at bedtime. (Patient taking differently: Inject 38 Units into the skin at bedtime. )  . Insulin Pen Needle (BD PEN NEEDLE NANO U/F) 32G X 4 MM MISC INJECT  SUBCUTANEOUSLY DAILY  . lisinopril (PRINIVIL,ZESTRIL)  20 MG tablet TAKE 1 TABLET BY MOUTH AT  BEDTIME  . meclizine (ANTIVERT) 25 MG tablet TAKE ONE-HALF TABLET BY  MOUTH TWO TIMES DAILY AS  NEEDED FOR DIZZINESS  . metFORMIN (GLUCOPHAGE) 1000 MG tablet TAKE 1 TABLET BY MOUTH 2  TIMES DAILY WITH MEALS  . metoprolol tartrate (LOPRESSOR) 50 MG tablet TAKE 1 TABLET BY MOUTH TWO  TIMES DAILY  . traMADol (ULTRAM) 50 MG tablet TAKE 1 TABLET BY MOUTH  DAILY AS NEEDED  . umeclidinium bromide (INCRUSE ELLIPTA) 62.5 MCG/INH AEPB Inhale 1 puff into the lungs daily.  . valACYclovir (VALTREX) 1000 MG tablet TAKE HALF TABLET BY MOUTH 2 TIMES DAILY. X 5 DAYS AS  NEEDED FOR OUTBREAK.   No facility-administered encounter medications on file as of 08/18/2018.     Activities of Daily Living No flowsheet data found.  Patient Care Team: Hali Marry, MD as PCP - General (Family Medicine) Stanford Breed Denice Bors, MD (Cardiology) Early, Arvilla Meres, MD as Attending Physician (Vascular Surgery) Druscilla Brownie, MD as Consulting Physician (Dermatology) Hayden Pedro, MD as Attending Physician (Ophthalmology) Irene Shipper, MD as Attending Physician (Gastroenterology) Irine Seal, MD as Attending Physician (Urology) Monna Fam, MD as Attending Physician (Ophthalmology) Rolm Bookbinder, MD as Consulting Physician (General Surgery)   Assessment:   This is a routine wellness examination for Rivanna.  Exercise Activities and Dietary recommendations   Diet  Breakfast: Lunch:  Dinner:       Goals   None     Fall Risk Fall Risk  04/30/2018 02/28/2017 12/19/2015 12/19/2015 10/12/2014  Falls in the past year? 0 No No No No   Is the patient's home free of loose throw rugs in walkways, pet beds, electrical cords, etc?   {Blank single:19197::"yes","no"}      Grab bars in the bathroom? {Blank single:19197::"yes","no"}      Handrails on the stairs?   {Blank single:19197::"yes","no"}      Adequate lighting?    {Blank single:19197::"yes","no"}   Depression Screen PHQ 2/9 Scores 04/30/2018 08/22/2017 02/28/2017 10/01/2016  PHQ - 2 Score 0 0 0 0  PHQ- 9 Score - 1 - -    Cognitive Function        Immunization History  Administered Date(s) Administered  . Influenza Whole 03/25/2011  . Influenza, High Dose Seasonal PF 02/28/2017, 04/30/2018  . Influenza,inj,Quad PF,6+ Mos 04/15/2013, 02/25/2014, 03/14/2015, 04/02/2016  . Pneumococcal Conjugate-13 11/24/2014  . Pneumococcal Polysaccharide-23 06/24/2008  . Td 02/01/2009  . Zoster 05/01/2011    Screening Tests Health Maintenance  Topic Date Due  . OPHTHALMOLOGY EXAM  01/01/2019  . TETANUS/TDAP  02/02/2019  . HEMOGLOBIN A1C  02/09/2019  . FOOT EXAM  05/01/2019  . INFLUENZA VACCINE  Completed  . PNA vac Low Risk Adult  Completed        Plan:   ***  I have personally reviewed and noted the following in the patient's chart:   . Medical and social history . Use of alcohol, tobacco or illicit drugs  . Current medications and supplements . Functional ability and status . Nutritional status . Physical activity . Advanced directives . List of other physicians . Hospitalizations, surgeries, and ER visits in previous 12 months . Vitals . Screenings to include cognitive, depression, and falls . Referrals and appointments  In addition, I have reviewed and discussed with patient certain preventive protocols, quality metrics, and best practice recommendations. A written personalized care plan for preventive services as well as general preventive health recommendations were provided to  patient.     Joanne Chars, LPN  01/31/1750

## 2018-08-18 ENCOUNTER — Ambulatory Visit: Payer: Medicare Other

## 2018-10-07 ENCOUNTER — Encounter (INDEPENDENT_AMBULATORY_CARE_PROVIDER_SITE_OTHER): Payer: Medicare Other | Admitting: Ophthalmology

## 2018-10-16 ENCOUNTER — Other Ambulatory Visit: Payer: Self-pay | Admitting: Family Medicine

## 2018-11-09 ENCOUNTER — Other Ambulatory Visit: Payer: Self-pay | Admitting: *Deleted

## 2018-11-09 ENCOUNTER — Ambulatory Visit: Payer: Medicare Other | Admitting: Family Medicine

## 2018-11-09 DIAGNOSIS — E1151 Type 2 diabetes mellitus with diabetic peripheral angiopathy without gangrene: Secondary | ICD-10-CM

## 2018-11-09 MED ORDER — INSULIN PEN NEEDLE 32G X 4 MM MISC: 3 refills | Status: DC

## 2018-12-17 ENCOUNTER — Other Ambulatory Visit: Payer: Self-pay | Admitting: *Deleted

## 2018-12-17 MED ORDER — METOPROLOL TARTRATE 50 MG PO TABS: 50.0000 mg | ORAL_TABLET | Freq: Two times a day (BID) | ORAL | 3 refills | Status: DC

## 2018-12-30 ENCOUNTER — Encounter (INDEPENDENT_AMBULATORY_CARE_PROVIDER_SITE_OTHER): Payer: Medicare Other | Admitting: Ophthalmology

## 2018-12-30 ENCOUNTER — Other Ambulatory Visit: Payer: Self-pay

## 2018-12-30 DIAGNOSIS — H353211 Exudative age-related macular degeneration, right eye, with active choroidal neovascularization: Secondary | ICD-10-CM

## 2018-12-30 DIAGNOSIS — H353122 Nonexudative age-related macular degeneration, left eye, intermediate dry stage: Secondary | ICD-10-CM

## 2018-12-30 DIAGNOSIS — H43813 Vitreous degeneration, bilateral: Secondary | ICD-10-CM

## 2018-12-30 DIAGNOSIS — H35033 Hypertensive retinopathy, bilateral: Secondary | ICD-10-CM | POA: Diagnosis not present

## 2018-12-30 DIAGNOSIS — I10 Essential (primary) hypertension: Secondary | ICD-10-CM | POA: Diagnosis not present

## 2018-12-30 LAB — HM DIABETES EYE EXAM

## 2018-12-31 ENCOUNTER — Ambulatory Visit: Payer: Medicare Other | Admitting: Family Medicine

## 2019-01-01 ENCOUNTER — Other Ambulatory Visit: Payer: Self-pay | Admitting: *Deleted

## 2019-01-01 MED ORDER — AMLODIPINE BESYLATE 10 MG PO TABS: 10.0000 mg | ORAL_TABLET | Freq: Every day | ORAL | 2 refills | Status: DC

## 2019-01-01 MED ORDER — DULOXETINE HCL 60 MG PO CPEP: 60.0000 mg | ORAL_CAPSULE | Freq: Every day | ORAL | 1 refills | Status: DC

## 2019-01-05 ENCOUNTER — Ambulatory Visit (INDEPENDENT_AMBULATORY_CARE_PROVIDER_SITE_OTHER): Payer: Medicare Other | Admitting: Family Medicine

## 2019-01-05 ENCOUNTER — Other Ambulatory Visit: Payer: Self-pay

## 2019-01-05 ENCOUNTER — Encounter: Payer: Self-pay | Admitting: Family Medicine

## 2019-01-05 VITALS — BP 122/76 | HR 93 | Ht 70.0 in | Wt 215.0 lb

## 2019-01-05 DIAGNOSIS — R252 Cramp and spasm: Secondary | ICD-10-CM | POA: Diagnosis not present

## 2019-01-05 DIAGNOSIS — E1151 Type 2 diabetes mellitus with diabetic peripheral angiopathy without gangrene: Secondary | ICD-10-CM | POA: Diagnosis not present

## 2019-01-05 DIAGNOSIS — I739 Peripheral vascular disease, unspecified: Secondary | ICD-10-CM | POA: Diagnosis not present

## 2019-01-05 DIAGNOSIS — R42 Dizziness and giddiness: Secondary | ICD-10-CM | POA: Diagnosis not present

## 2019-01-05 DIAGNOSIS — I10 Essential (primary) hypertension: Secondary | ICD-10-CM

## 2019-01-05 DIAGNOSIS — N182 Chronic kidney disease, stage 2 (mild): Secondary | ICD-10-CM | POA: Diagnosis not present

## 2019-01-05 LAB — POCT GLYCOSYLATED HEMOGLOBIN (HGB A1C): Hemoglobin A1C: 6.2 % — AB (ref 4.0–5.6)

## 2019-01-05 MED ORDER — INCRUSE ELLIPTA 62.5 MCG/INH IN AEPB: 1.0000 | INHALATION_SPRAY | Freq: Every day | RESPIRATORY_TRACT | 3 refills | Status: AC

## 2019-01-05 NOTE — Assessment & Plan Note (Signed)
Dizziness still could be a medication side effect it sounds like it is not the metoprolol he did hold that for 10 days with no significant improvement.  So I want him to start next with the amlodipine.  Cut in half for 1 week if he feels like he is feeling a little better then go ahead and just stop the medication completely for 1 week to see if he feels like that might be the culprit.  If not then restart the amlodipine and then start tapering the lisinopril with half a tab for a week and then a whole tab.

## 2019-01-05 NOTE — Assessment & Plan Note (Signed)
Well controlled. Continue current regimen. Follow up in  4 months.   

## 2019-01-05 NOTE — Assessment & Plan Note (Signed)
He is doing fantastic.  A1c is actually down to 6.2 which is a significant drop from previous A1c so just congratulated him on changes that he is made.  I do encourage him to stay active as it does sound like he is been fairly sedentary since COVID started.  But sounds like he is made some great dietary changes which is fantastic.  Due for BMP today.  Reminded to schedule eye exam.

## 2019-01-05 NOTE — Progress Notes (Signed)
Pt reports that he has felt dizzy for the past 3-4 months. This happens when he moves to quickly.  Pt stated that he has pain that goes from his buttocks down his legs and that his legs feel tired and crampy. He said that he had to steady himself when he stands up because his ankles lock up.Elouise Munroe, [place]

## 2019-01-05 NOTE — Progress Notes (Signed)
Established Patient Office Visit  Subjective:  Patient ID: Clayton Harrison, male    DOB: 05-29-1931  Age: 85 y.o. MRN: [id number]  CC:  Chief Complaint  Patient presents with  . Diabetes  . Hypertension    HPI Clayton Harrison presents for   Hypertension- Pt denies chest pain, SOB, dizziness, or heart palpitations.  Taking meds as directed w/o problems.  Denies medication side effects.    Diabetes - no hypoglycemic events. No wounds or sores that are not healing well. No increased thirst or urination. Checking glucose at home. Taking medications as prescribed without any side effects.  His wife says he is actually been cutting back on sweets such as cookies and sweetened beverages.  F/U PVD -he says more recently he has been experiencing cramps at night in his legs.  It can be in 1 or the other.  He has not noticed any known triggers but he says he can get up drink a couple glasses of water and stretch and then it finally eases off and quit so he can go back to bed.  F/U Dizziness -when I last saw him he was experiencing some positional dizziness.  I encouraged him to individually hold a blood pressure pill 1 week at a time to see if he could figure out if it was 1 of the medications causing a side effect.  He did stop the metoprolol for about 10 days and says he did not notice any difference at all and then when he checked his blood pressure was extremely high so he restarted the medication.   Past Medical History:  Diagnosis Date  . Adenomatous colon polyp    2.5cm excised by right colectomy 2011  . Anemia, blood loss    history of  . Blockage of coronary artery of heart ([place]) 2001   x 4  . CAD (coronary artery disease)   . Carotid artery occlusion   . Cataract   . Cerebrovascular disease, unspecified   . Colon tumor 03/2010   right ascending  . Diabetic retinopathy ([place]) 09/29/2017  . DM (diabetes mellitus) ([place])   . Elevated PSA   . GERD (gastroesophageal reflux disease)    . Hearing loss   . HTN (hypertension)   . Hypercholesteremia   . Interstitial cystitis   . Macular degeneration   . Other and unspecified hyperlipidemia   . Prostate cancer ([place]) 03/09/14   Gleason 4+5=9, volume 98 mL  . PVD (peripheral vascular disease) ([place])   . S/P radiation therapy 07/20/2014 through 09/13/2014    Prostate 7800 cGy in 40 sessions, seminal vesicles 5600 cGy in 40 sessions   . SBO (small bowel obstruction) ([place]) 07/2013  . Skin cancer 2012   melanoma upper back, removed  . Statin intolerance   . Vertigo   . Vertigo     Past Surgical History:  Procedure Laterality Date  . APPENDECTOMY  05/2010   with right hemicolectomy  . CARDIAC CATHETERIZATION  11/1999  . CAROTID ENDARTERECTOMY  2012   right  . CORONARY ARTERY BYPASS GRAFT  2001   4  . dental extractions Bilateral 1980  . DENTAL SURGERY    . [place]  . left carpal tunnel release  2009  . PARTIAL COLECTOMY  05/24/2010   Lap converted to open right proximal colectomy  . PROSTATE BIOPSY  03/09/14   Gleason 9, vol 98 mL  . SKIN CANCER EXCISION  02/2011   upper back  .  TONSILLECTOMY  1940  . URETEROLITHOTOMY      Family History  Problem Relation Age of Onset  . Diabetes Mother   . Heart disease Mother   . Heart failure Mother   . Hypertension Mother   . Heart disease Father   . Cancer Cousin        colon  . Colon cancer Maternal Uncle        x4  . Cancer Maternal Uncle        colon  . Cancer Daughter        breast  . Cancer Daughter        breast    Social History   Socioeconomic History  . Marital status: Married    Spouse name: [name]  . Number of children: 2  . Years of education: HS  . Highest education level: Not on file  Occupational History  . Occupation: Retired  Scientific laboratory technician  . Financial resource strain: Not on file  . Food insecurity    Worry: Not on file    Inability: Not on file   . Transportation needs    Medical: Not on file    Non-medical: Not on file  Tobacco Use  . Smoking status: Former Smoker    Packs/day: 2.00    Years: 40.00    Pack years: 80.00    Quit date: 11/23/1999    Years since quitting: 19.1  . Smokeless tobacco: Current User    Types: Chew  Substance and Sexual Activity  . Alcohol use: No  . Drug use: No  . Sexual activity: Yes    Partners: Female  Lifestyle  . Physical activity    Days per week: Not on file    Minutes per session: Not on file  . Stress: Not on file  Relationships  . Social Herbalist on phone: Not on file    Gets together: Not on file    Attends religious service: Not on file    Active member of club or organization: Not on file    Attends meetings of clubs or organizations: Not on file    Relationship status: Not on file  . Intimate partner violence    Fear of current or ex partner: Not on file    Emotionally abused: Not on file    Physically abused: Not on file    Forced sexual activity: Not on file  Other Topics Concern  . Not on file  Social History Narrative   No regular exercise. Daily caffeine- 3-4 cups daily          Outpatient Medications Prior to Visit  Medication Sig Dispense Refill  . acetaminophen (TYLENOL) 500 MG tablet Take 1,000 mg by mouth every 6 (six) hours as needed for mild pain.    . Albuterol Sulfate (PROAIR RESPICLICK) 623 (90 Base) MCG/ACT AEPB Inhale 2 puffs into the lungs every 6 (six) hours as needed. 3 each 1  . amLODipine (NORVASC) 10 MG tablet Take 1 tablet (10 mg total) by mouth daily. 90 tablet 2  . aspirin 325 MG tablet Take 325 mg by mouth daily.      . DULoxetine (CYMBALTA) 60 MG capsule Take 1 capsule (60 mg total) by mouth daily. 90 capsule 1  . glipiZIDE (GLUCOTROL) 10 MG tablet TAKE 1 TABLET BY MOUTH  TWICE A DAY BEFORE MEALS 180 tablet 3  . Insulin Glargine (TOUJEO SOLOSTAR) 300 UNIT/ML SOPN Inject 34 Units into the skin at bedtime. (Patient taking  differently: Inject 38 Units into the skin at bedtime. 38 units) 4.5 mL 6  . Insulin Pen Needle (BD PEN NEEDLE NANO U/F) 32G X 4 MM MISC INJECT SUBCUTANEOUSLY DAILY 90 each 3  . lisinopril (ZESTRIL) 20 MG tablet TAKE 1 TABLET BY MOUTH AT  BEDTIME 90 tablet 1  . meclizine (ANTIVERT) 25 MG tablet TAKE ONE-HALF TABLET BY  MOUTH TWO TIMES DAILY AS  NEEDED FOR DIZZINESS 90 tablet 1  . metFORMIN (GLUCOPHAGE) 1000 MG tablet TAKE 1 TABLET BY MOUTH 2  TIMES DAILY WITH MEALS 180 tablet 1  . metoprolol tartrate (LOPRESSOR) 50 MG tablet Take 1 tablet (50 mg total) by mouth 2 (two) times daily. 180 tablet 3  . traMADol (ULTRAM) 50 MG tablet TAKE 1 TABLET BY MOUTH  DAILY AS NEEDED 90 tablet 3  . valACYclovir (VALTREX) 1000 MG tablet TAKE HALF TABLET BY MOUTH 2 TIMES DAILY. X 5 DAYS AS  NEEDED FOR OUTBREAK. 90 tablet 1  . umeclidinium bromide (INCRUSE ELLIPTA) 62.5 MCG/INH AEPB Inhale 1 puff into the lungs daily. 90 each 3   No facility-administered medications prior to visit.     Allergies  Allergen Reactions  . Shellfish Allergy Anaphylaxis    Swelling of the throat  . Statins Other (See Comments)    Myalgias, urinary frequency  . Ezetimibe Other (See Comments)    "hip problems"  . Flomax [Tamsulosin Hcl] Other (See Comments)    Complains of dizziness  . Prednisone Other (See Comments)    Crazy dreams, insomnia  . Tizanidine Other (See Comments)    Insomnia, nightmares    ROS Review of Systems    Objective:    Physical Exam  Constitutional: He is oriented to person, place, and time. He appears well-developed and well-nourished.  HENT:  Head: Normocephalic and atraumatic.  Cardiovascular: Normal rate, regular rhythm and normal heart sounds.  Pulmonary/Chest: Effort normal and breath sounds normal.  Neurological: He is alert and oriented to person, place, and time.  Skin: Skin is warm and dry.  Psychiatric: He has a normal mood and affect. His behavior is normal.    BP 122/76   Pulse  93   Ht 5\' 10"  (1.778 m)   Wt 215 lb (97.5 kg)   SpO2 94%   BMI 30.85 kg/m  Wt Readings from Last 3 Encounters:  01/05/19 215 lb (97.5 kg)  08/11/18 216 lb (98 kg)  06/30/18 220 lb (99.8 kg)     Health Maintenance Due  Topic Date Due  . OPHTHALMOLOGY EXAM  01/01/2019    There are no preventive care reminders to display for this patient.  Lab Results  Component Value Date   TSH 2.10 07/11/2017   Lab Results  Component Value Date   WBC 6.3 03/14/2015   HGB 11.8 (L) 03/14/2015   HCT 34.1 (L) 03/14/2015   MCV 86.8 03/14/2015   PLT 251 03/14/2015   Lab Results  Component Value Date   NA 140 05/14/2018   K 4.6 05/14/2018   CO2 26 05/14/2018   GLUCOSE 106 (H) 05/14/2018   BUN 17 05/14/2018   CREATININE 1.30 (H) 05/14/2018   BILITOT 0.5 05/14/2018   ALKPHOS 50 07/20/2015   AST 21 05/14/2018   ALT 17 05/14/2018   PROT 7.0 05/14/2018   ALBUMIN 4.0 07/20/2015   CALCIUM 9.4 05/14/2018   GFR 59.22 (L) 03/06/2012   Lab Results  Component Value Date   CHOL 185 05/14/2018   Lab Results  Component Value Date  HDL 42 05/14/2018   Lab Results  Component Value Date   LDLCALC 109 (H) 05/14/2018   Lab Results  Component Value Date   TRIG 215 (H) 05/14/2018   Lab Results  Component Value Date   CHOLHDL 4.4 05/14/2018   Lab Results  Component Value Date   HGBA1C 6.2 (A) 01/05/2019      Assessment & Plan:   Problem List Items Addressed This Visit      Cardiovascular and Mediastinum   Peripheral vascular disease ([place])   Essential hypertension    Well controlled. Continue current regimen. Follow up in  4 months.       Relevant Orders   TSH   Ferritin   CBC with Differential/Platelet   Magnesium   Calcium, ionized   BASIC METABOLIC PANEL WITH GFR   DM (diabetes mellitus) with peripheral vascular complication ([place]) - Primary    He is doing fantastic.  A1c is actually down to 6.2 which is a significant drop from previous A1c so just congratulated him on  changes that he is made.  I do encourage him to stay active as it does sound like he is been fairly sedentary since COVID started.  But sounds like he is made some great dietary changes which is fantastic.  Due for BMP today.  Reminded to schedule eye exam.      Relevant Orders   POCT glycosylated hemoglobin (Hb A1C) (Completed)   TSH   Ferritin   CBC with Differential/Platelet   Magnesium   Calcium, ionized   BASIC METABOLIC PANEL WITH GFR     Genitourinary   Chronic kidney disease (CKD) stage G2/A2, mildly decreased glomerular filtration rate (GFR) between 60-89 mL/min/1.73 square meter and albuminuria creatinine ratio between 30-299 mg/g     Other   Dizziness    Dizziness still could be a medication side effect it sounds like it is not the metoprolol he did hold that for 10 days with no significant improvement.  So I want him to start next with the amlodipine.  Cut in half for 1 week if he feels like he is feeling a little better then go ahead and just stop the medication completely for 1 week to see if he feels like that might be the culprit.  If not then restart the amlodipine and then start tapering the lisinopril with half a tab for a week and then a whole tab.       Other Visit Diagnoses    Leg cramp       Relevant Orders   TSH   Ferritin   CBC with Differential/Platelet   Magnesium   Calcium, ionized   BASIC METABOLIC PANEL WITH GFR     Leg cramps which is started recently.  Certainly could be related to an electrolyte imbalance for potassium or calcium.  Would like to check those labs today.  Also check for anemia.  Discussed with him that it could also be related to hydration since drinking water seems to help could also be related to shoewear and going more barefoot which is what he usually does at home.  He can monitor for these things to see if he can pinpoint any specific triggers.  We can always consider treatment options if it continues and we do not find a potential  cause.  Meds ordered this encounter  Medications  . umeclidinium bromide (INCRUSE ELLIPTA) 62.5 MCG/INH AEPB    Sig: Inhale 1 puff into the lungs daily.    Dispense:  90 each    Refill:  3    Follow-up: Return in about 3 months (around 04/07/2019) for Diabetes follow-up.    [name], MD

## 2019-01-05 NOTE — Patient Instructions (Signed)
Cut your amlodipine in half for one week. If dizziness is better then OK to stop pill completle and then call me.

## 2019-01-06 LAB — BASIC METABOLIC PANEL WITH GFR
BUN/Creatinine Ratio: 18 (calc) (ref 6–22)
BUN: 23 mg/dL (ref 7–25)
CO2: 24 mmol/L (ref 20–32)
Calcium: 9.8 mg/dL (ref 8.6–10.3)
Chloride: 103 mmol/L (ref 98–110)
Creat: 1.31 mg/dL — ABNORMAL HIGH (ref 0.70–1.11)
GFR, Est African American: 57 mL/min/{1.73_m2} — ABNORMAL LOW (ref >=60)
GFR, Est Non African American: 49 mL/min/{1.73_m2} — ABNORMAL LOW (ref >=60)
Glucose, Bld: 198 mg/dL — ABNORMAL HIGH (ref 65–99)
Potassium: 4.9 mmol/L (ref 3.5–5.3)
Sodium: 137 mmol/L (ref 135–146)

## 2019-01-06 LAB — CBC WITH DIFFERENTIAL/PLATELET
Absolute Monocytes: 532 cells/uL (ref 200–950)
Basophils Absolute: 28 cells/uL (ref 0–200)
Basophils Relative: 0.4 %
Eosinophils Absolute: 203 cells/uL (ref 15–500)
Eosinophils Relative: 2.9 %
HCT: 36.6 % — ABNORMAL LOW (ref 38.5–50.0)
Hemoglobin: 12.7 g/dL — ABNORMAL LOW (ref 13.2–17.1)
Lymphs Abs: 1736 cells/uL (ref 850–3900)
MCH: 30 pg (ref 27.0–33.0)
MCHC: 34.7 g/dL (ref 32.0–36.0)
MCV: 86.3 fL (ref 80.0–100.0)
MPV: 10.8 fL (ref 7.5–12.5)
Monocytes Relative: 7.6 %
Neutro Abs: 4501 cells/uL (ref 1500–7800)
Neutrophils Relative %: 64.3 %
Platelets: 249 10*3/uL (ref 140–400)
RBC: 4.24 10*6/uL (ref 4.20–5.80)
RDW: 13.6 % (ref 11.0–15.0)
Total Lymphocyte: 24.8 %
WBC: 7 10*3/uL (ref 3.8–10.8)

## 2019-01-06 LAB — CALCIUM, IONIZED: Calcium, Ion: 5.16 mg/dL (ref 4.8–5.6)

## 2019-01-06 LAB — MAGNESIUM: Magnesium: 1.7 mg/dL (ref 1.5–2.5)

## 2019-01-06 LAB — TSH: TSH: 2.02 mIU/L (ref 0.40–4.50)

## 2019-01-06 LAB — FERRITIN: Ferritin: 13 ng/mL — ABNORMAL LOW (ref 24–380)

## 2019-01-07 ENCOUNTER — Encounter: Payer: Self-pay | Admitting: Family Medicine

## 2019-01-07 ENCOUNTER — Other Ambulatory Visit: Payer: Self-pay | Admitting: *Deleted

## 2019-01-07 DIAGNOSIS — R790 Abnormal level of blood mineral: Secondary | ICD-10-CM

## 2019-01-22 ENCOUNTER — Encounter: Payer: Self-pay | Admitting: Family Medicine

## 2019-02-15 DIAGNOSIS — Z87442 Personal history of urinary calculi: Secondary | ICD-10-CM | POA: Diagnosis not present

## 2019-02-15 DIAGNOSIS — R1084 Generalized abdominal pain: Secondary | ICD-10-CM | POA: Diagnosis not present

## 2019-02-23 DIAGNOSIS — Z87442 Personal history of urinary calculi: Secondary | ICD-10-CM | POA: Diagnosis not present

## 2019-02-23 DIAGNOSIS — R109 Unspecified abdominal pain: Secondary | ICD-10-CM | POA: Diagnosis not present

## 2019-02-23 DIAGNOSIS — R1084 Generalized abdominal pain: Secondary | ICD-10-CM | POA: Diagnosis not present

## 2019-02-23 DIAGNOSIS — R3912 Poor urinary stream: Secondary | ICD-10-CM | POA: Diagnosis not present

## 2019-02-26 ENCOUNTER — Other Ambulatory Visit: Payer: Self-pay | Admitting: *Deleted

## 2019-02-26 MED ORDER — GLIPIZIDE 10 MG PO TABS: ORAL_TABLET | ORAL | 3 refills | Status: DC

## 2019-02-26 MED ORDER — LISINOPRIL 20 MG PO TABS: 20.0000 mg | ORAL_TABLET | Freq: Every day | ORAL | 1 refills | Status: DC

## 2019-03-10 ENCOUNTER — Other Ambulatory Visit: Payer: Self-pay

## 2019-03-10 ENCOUNTER — Other Ambulatory Visit (HOSPITAL_COMMUNITY): Payer: Self-pay | Admitting: Cardiology

## 2019-03-10 ENCOUNTER — Ambulatory Visit (HOSPITAL_COMMUNITY)
Admission: RE | Admit: 2019-03-10 | Discharge: 2019-03-10 | Disposition: A | Discharge status: Home / Self Care | Payer: Medicare Other | Source: Ambulatory Visit | Attending: Cardiovascular Disease | Admitting: Cardiovascular Disease

## 2019-03-10 DIAGNOSIS — I6523 Occlusion and stenosis of bilateral carotid arteries: Secondary | ICD-10-CM

## 2019-03-10 DIAGNOSIS — I6529 Occlusion and stenosis of unspecified carotid artery: Secondary | ICD-10-CM | POA: Diagnosis not present

## 2019-03-19 ENCOUNTER — Other Ambulatory Visit: Payer: Self-pay | Admitting: *Deleted

## 2019-03-19 MED ORDER — TOUJEO MAX SOLOSTAR 300 UNIT/ML ~~LOC~~ SOPN: 38.0000 [IU] | PEN_INJECTOR | Freq: Every day | SUBCUTANEOUS | 6 refills | Status: DC

## 2019-03-29 ENCOUNTER — Other Ambulatory Visit: Payer: Self-pay | Admitting: *Deleted

## 2019-03-29 MED ORDER — TOUJEO SOLOSTAR 300 UNIT/ML ~~LOC~~ SOPN: 38.0000 [IU] | PEN_INJECTOR | Freq: Every day | SUBCUTANEOUS | 6 refills | Status: DC

## 2019-03-30 ENCOUNTER — Telehealth: Payer: Self-pay

## 2019-03-30 NOTE — Telephone Encounter (Signed)
Pt advised.

## 2019-03-30 NOTE — Telephone Encounter (Signed)
Clayton Harrison called and states his Toujeo dose change from 34 units to 38 units. I didn't see anywhere in the chart that it was changed to 38 units. Please advise.

## 2019-03-30 NOTE — Telephone Encounter (Signed)
OK for 38, OK to send new rx if needed.

## 2019-04-08 ENCOUNTER — Encounter: Payer: Self-pay | Admitting: Family Medicine

## 2019-04-08 ENCOUNTER — Other Ambulatory Visit: Payer: Self-pay

## 2019-04-08 ENCOUNTER — Ambulatory Visit (INDEPENDENT_AMBULATORY_CARE_PROVIDER_SITE_OTHER): Payer: Medicare Other | Admitting: Family Medicine

## 2019-04-08 VITALS — BP 130/64 | HR 62 | Ht 70.08 in | Wt 213.0 lb

## 2019-04-08 DIAGNOSIS — E1151 Type 2 diabetes mellitus with diabetic peripheral angiopathy without gangrene: Secondary | ICD-10-CM | POA: Diagnosis not present

## 2019-04-08 DIAGNOSIS — I10 Essential (primary) hypertension: Secondary | ICD-10-CM | POA: Diagnosis not present

## 2019-04-08 DIAGNOSIS — R42 Dizziness and giddiness: Secondary | ICD-10-CM | POA: Diagnosis not present

## 2019-04-08 DIAGNOSIS — Z23 Encounter for immunization: Secondary | ICD-10-CM | POA: Diagnosis not present

## 2019-04-08 DIAGNOSIS — N182 Chronic kidney disease, stage 2 (mild): Secondary | ICD-10-CM | POA: Diagnosis not present

## 2019-04-08 DIAGNOSIS — R2681 Unsteadiness on feet: Secondary | ICD-10-CM

## 2019-04-08 LAB — POCT GLYCOSYLATED HEMOGLOBIN (HGB A1C): Hemoglobin A1C: 6.7 % — AB (ref 4.0–5.6)

## 2019-04-08 MED ORDER — TETANUS-DIPHTH-ACELL PERTUSSIS 5-2-15.5 LF-MCG/0.5 IM SUSP: 0.5000 mL | Freq: Once | INTRAMUSCULAR | 0 refills | Status: AC

## 2019-04-08 NOTE — Assessment & Plan Note (Signed)
Due to recheck renal function.  Labs provided.

## 2019-04-08 NOTE — Patient Instructions (Signed)
Can try Tai Chi on You [employer] for Seniors. There are several videos you can follow along with. Recommend to try them daily.

## 2019-04-08 NOTE — Progress Notes (Signed)
Established Patient Office Visit  Subjective:  Patient ID: Clayton Harrison, male    DOB: [birth year]  Age: 85 y.o. MRN: [id number]  CC:  Chief Complaint  Patient presents with  . Diabetes    HPI [name] presents for   Hypertension- Pt denies chest pain, SOB, dizziness, or heart palpitations.  Taking meds as directed w/o problems.  Denies medication side effects.    Diabetes - no hypoglycemic events. No wounds or sores that are not healing well. No increased thirst or urination. Checking glucose at home. Taking medications as prescribed without any side effects.  C/O of feeling dizzy when he looks up. Just had his f/u Carotid US and it is stable.  For example, when reaching up and looking up to get coffee cup out of cuboard he will get dizzy for a few seconds. He thinks it could his macular degeneration.    Feels like his balance is getting worse. Has days where he has to use his cane. He doesn;t exercise regularly.    Past Medical History:  Diagnosis Date  . Adenomatous colon polyp    2.5cm excised by right colectomy 2011  . Anemia, blood loss    history of  . Blockage of coronary artery of heart ([place]) 2001   x 4  . CAD (coronary artery disease)   . Carotid artery occlusion   . Cataract   . Cerebrovascular disease, unspecified   . Colon tumor 03/2010   right ascending  . Diabetic retinopathy ([place]) 09/29/2017  . DM (diabetes mellitus) ([place])   . Elevated PSA   . GERD (gastroesophageal reflux disease)   . Hearing loss   . HTN (hypertension)   . Hypercholesteremia   . Interstitial cystitis   . Macular degeneration   . Other and unspecified hyperlipidemia   . Prostate cancer ([place]) 03/09/14   Gleason 4+5=9, volume 98 mL  . PVD (peripheral vascular disease) ([place])   . S/P radiation therapy 07/20/2014 through 09/13/2014    Prostate 7800 cGy in 40 sessions, seminal vesicles 5600 cGy in 40 sessions   .  SBO (small bowel obstruction) ([place]) 07/2013  . Skin cancer 2012   melanoma upper back, removed  . Statin intolerance   . Vertigo   . Vertigo     Past Surgical History:  Procedure Laterality Date  . APPENDECTOMY  05/2010   with right hemicolectomy  . CARDIAC CATHETERIZATION  11/1999  . CAROTID ENDARTERECTOMY  2012   right  . CORONARY ARTERY BYPASS GRAFT  2001   4  . dental extractions Bilateral 1980  . DENTAL SURGERY    . [place]  . left carpal tunnel release  2009  . PARTIAL COLECTOMY  05/24/2010   Lap converted to open right proximal colectomy  . PROSTATE BIOPSY  03/09/14   Gleason 9, vol 98 mL  . SKIN CANCER EXCISION  02/2011   upper back  . TONSILLECTOMY  1940  . URETEROLITHOTOMY      Family History  Problem Relation Age of Onset  . Diabetes Mother   . Heart disease Mother   . Heart failure Mother   . Hypertension Mother   . Heart disease Father   . Cancer Cousin        colon  . Colon cancer Maternal Uncle        x4  . Cancer Maternal Uncle        colon  . Cancer Daughter  breast  . Cancer Daughter        breast    Social History   Socioeconomic History  . Marital status: Married    Spouse name: [name]  . Number of children: 2  . Years of education: HS  . Highest education level: Not on file  Occupational History  . Occupation: Retired  Scientific laboratory technician  . Financial resource strain: Not on file  . Food insecurity    Worry: Not on file    Inability: Not on file  . Transportation needs    Medical: Not on file    Non-medical: Not on file  Tobacco Use  . Smoking status: Former Smoker    Packs/day: 2.00    Years: 40.00    Pack years: 80.00    Quit date: 11/23/1999    Years since quitting: 19.3  . Smokeless tobacco: Current User    Types: Chew  Substance and Sexual Activity  . Alcohol use: No  . Drug use: No  . Sexual activity: Yes    Partners: Female  Lifestyle  . Physical activity    Days per week: Not on file    Minutes  per session: Not on file  . Stress: Not on file  Relationships  . Social Herbalist on phone: Not on file    Gets together: Not on file    Attends religious service: Not on file    Active member of club or organization: Not on file    Attends meetings of clubs or organizations: Not on file    Relationship status: Not on file  . Intimate partner violence    Fear of current or ex partner: Not on file    Emotionally abused: Not on file    Physically abused: Not on file    Forced sexual activity: Not on file  Other Topics Concern  . Not on file  Social History Narrative   No regular exercise. Daily caffeine- 3-4 cups daily          Outpatient Medications Prior to Visit  Medication Sig Dispense Refill  . acetaminophen (TYLENOL) 500 MG tablet Take 1,000 mg by mouth every 6 (six) hours as needed for mild pain.    . Albuterol Sulfate (PROAIR RESPICLICK) 962 (90 Base) MCG/ACT AEPB Inhale 2 puffs into the lungs every 6 (six) hours as needed. 3 each 1  . amLODipine (NORVASC) 10 MG tablet Take 1 tablet (10 mg total) by mouth daily. 90 tablet 2  . aspirin 325 MG tablet Take 325 mg by mouth daily.      . DULoxetine (CYMBALTA) 60 MG capsule Take 1 capsule (60 mg total) by mouth daily. 90 capsule 1  . glipiZIDE (GLUCOTROL) 10 MG tablet TAKE 1 TABLET BY MOUTH  TWICE A DAY BEFORE MEALS 180 tablet 3  . Insulin Glargine, 1 Unit Dial, (TOUJEO SOLOSTAR) 300 UNIT/ML SOPN Inject 38 Units into the skin at bedtime. (Patient taking differently: Inject 34 Units into the skin at bedtime. ) 4.5 mL 6  . Insulin Pen Needle (BD PEN NEEDLE NANO U/F) 32G X 4 MM MISC INJECT SUBCUTANEOUSLY DAILY 90 each 3  . lisinopril (ZESTRIL) 20 MG tablet Take 1 tablet (20 mg total) by mouth at bedtime. 90 tablet 1  . meclizine (ANTIVERT) 25 MG tablet TAKE ONE-HALF TABLET BY  MOUTH TWO TIMES DAILY AS  NEEDED FOR DIZZINESS 90 tablet 1  . metFORMIN (GLUCOPHAGE) 1000 MG tablet TAKE 1 TABLET BY MOUTH 2  TIMES DAILY  WITH MEALS  180 tablet 1  . metoprolol tartrate (LOPRESSOR) 50 MG tablet Take 1 tablet (50 mg total) by mouth 2 (two) times daily. 180 tablet 3  . tamsulosin (FLOMAX) 0.4 MG CAPS capsule Take 0.4 mg by mouth daily.    . traMADol (ULTRAM) 50 MG tablet TAKE 1 TABLET BY MOUTH  DAILY AS NEEDED 90 tablet 3  . umeclidinium bromide (INCRUSE ELLIPTA) 62.5 MCG/INH AEPB Inhale 1 puff into the lungs daily. 90 each 3  . valACYclovir (VALTREX) 1000 MG tablet TAKE HALF TABLET BY MOUTH 2 TIMES DAILY. X 5 DAYS AS  NEEDED FOR OUTBREAK. 90 tablet 1   No facility-administered medications prior to visit.     Allergies  Allergen Reactions  . Shellfish Allergy Anaphylaxis    Swelling of the throat  . Statins Other (See Comments)    Myalgias, urinary frequency  . Ezetimibe Other (See Comments)    "hip problems"  . Flomax [Tamsulosin Hcl] Other (See Comments)    Complains of dizziness  . Prednisone Other (See Comments)    Crazy dreams, insomnia  . Tizanidine Other (See Comments)    Insomnia, nightmares    ROS Review of Systems    Objective:    Physical Exam  Constitutional: He is oriented to person, place, and time. He appears well-developed and well-nourished.  HENT:  Head: Normocephalic and atraumatic.  Right Ear: External ear normal.  Left Ear: External ear normal.  TMs and canals are clear bilaterally  Eyes: Conjunctivae are normal.  Neck: Neck supple.  Cardiovascular: Normal rate, regular rhythm and normal heart sounds.  No carotid bruits.   Pulmonary/Chest: Effort normal and breath sounds normal.  Neurological: He is alert and oriented to person, place, and time.  Skin: Skin is warm and dry.  Psychiatric: He has a normal mood and affect. His behavior is normal.    BP 130/64   Pulse 62   Ht 5' 10.08" (1.78 m)   Wt 213 lb (96.6 kg)   SpO2 97%   BMI 30.49 kg/m  Wt Readings from Last 3 Encounters:  04/08/19 213 lb (96.6 kg)  01/05/19 215 lb (97.5 kg)  08/11/18 216 lb (98 kg)     There  are no preventive care reminders to display for this patient.  There are no preventive care reminders to display for this patient.  Lab Results  Component Value Date   TSH 2.02 01/05/2019   Lab Results  Component Value Date   WBC 7.0 01/05/2019   HGB 12.7 (L) 01/05/2019   HCT 36.6 (L) 01/05/2019   MCV 86.3 01/05/2019   PLT 249 01/05/2019   Lab Results  Component Value Date   NA 137 01/05/2019   K 4.9 01/05/2019   CO2 24 01/05/2019   GLUCOSE 198 (H) 01/05/2019   BUN 23 01/05/2019   CREATININE 1.31 (H) 01/05/2019   BILITOT 0.5 05/14/2018   ALKPHOS 50 07/20/2015   AST 21 05/14/2018   ALT 17 05/14/2018   PROT 7.0 05/14/2018   ALBUMIN 4.0 07/20/2015   CALCIUM 9.8 01/05/2019   GFR 59.22 (L) 03/06/2012   Lab Results  Component Value Date   CHOL 185 05/14/2018   Lab Results  Component Value Date   HDL 42 05/14/2018   Lab Results  Component Value Date   LDLCALC 109 (H) 05/14/2018   Lab Results  Component Value Date   TRIG 215 (H) 05/14/2018   Lab Results  Component Value Date   CHOLHDL 4.4 05/14/2018  Lab Results  Component Value Date   HGBA1C 6.7 (A) 04/08/2019      Assessment & Plan:   Problem List Items Addressed This Visit      Cardiovascular and Mediastinum   Essential hypertension    Well controlled. Continue current regimen. Follow up in  4 mo      Relevant Orders   COMPLETE METABOLIC PANEL WITH GFR   Lipid panel   DM (diabetes mellitus) with peripheral vascular complication ([place]) - Primary    Well controlled. Continue current regimen. Follow up in  4 mo. Great job!      Relevant Orders   POCT glycosylated hemoglobin (Hb A1C) (Completed)   COMPLETE METABOLIC PANEL WITH GFR   Lipid panel     Genitourinary   Chronic kidney disease (CKD) stage G2/A2, mildly decreased glomerular filtration rate (GFR) between 60-89 mL/min/1.73 square meter and albuminuria creatinine ratio between 30-299 mg/g    Due to recheck renal function.  Labs provided.           Other   Gait instability    Discussed Tai Chi for seniors on [employer]. Studies show this can reduce falls and injury.   Normally I would recommend a balance class.  Encouraged him to do exercises with his wife.       Dizziness    Consider vertebral artery compression by osteoarthritis/osteophytes since occurs with head extension.         Other Visit Diagnoses    Need for immunization against influenza       Relevant Orders   Flu Vaccine QUAD High Dose(Fluad) (Completed)      Dizziness - recent carotid dopplers with no change.    Encouraged him to get Tdap at the pharmacy.   Meds ordered this encounter  Medications  . Tdap (ADACEL) 10-23-13.5 LF-MCG/0.5 injection    Sig: Inject 0.5 mLs into the muscle once for 1 dose.    Dispense:  0.5 mL    Refill:  0    Follow-up: Return in about 4 months (around 08/09/2019) for Diabetes follow-up.    [name], MD

## 2019-04-08 NOTE — Assessment & Plan Note (Signed)
Discussed Tai Chi for seniors on [employer]. Studies show this can reduce falls and injury.   Normally I would recommend a balance class.  Encouraged him to do exercises with his wife.

## 2019-04-08 NOTE — Assessment & Plan Note (Signed)
Well controlled. Continue current regimen. Follow up in  4 mo 

## 2019-04-08 NOTE — Assessment & Plan Note (Signed)
Consider vertebral artery compression by osteoarthritis/osteophytes since occurs with head extension.

## 2019-04-08 NOTE — Assessment & Plan Note (Signed)
Well controlled. Continue current regimen. Follow up in  4 mo. Great job!

## 2019-04-09 ENCOUNTER — Other Ambulatory Visit: Payer: Self-pay | Admitting: *Deleted

## 2019-04-09 DIAGNOSIS — R7989 Other specified abnormal findings of blood chemistry: Secondary | ICD-10-CM

## 2019-04-09 LAB — COMPLETE METABOLIC PANEL WITH GFR
AG Ratio: 1.8 (calc) (ref 1.0–2.5)
ALT: 13 U/L (ref 9–46)
AST: 18 U/L (ref 10–35)
Albumin: 4.3 g/dL (ref 3.6–5.1)
Alkaline phosphatase (APISO): 61 U/L (ref 35–144)
BUN/Creatinine Ratio: 15 (calc) (ref 6–22)
BUN: 25 mg/dL (ref 7–25)
CO2: 24 mmol/L (ref 20–32)
Calcium: 9.5 mg/dL (ref 8.6–10.3)
Chloride: 101 mmol/L (ref 98–110)
Creat: 1.65 mg/dL — ABNORMAL HIGH (ref 0.70–1.11)
GFR, Est African American: 43 mL/min/{1.73_m2} — ABNORMAL LOW (ref >=60)
GFR, Est Non African American: 37 mL/min/{1.73_m2} — ABNORMAL LOW (ref >=60)
Globulin: 2.4 g/dL (calc) (ref 1.9–3.7)
Glucose, Bld: 104 mg/dL — ABNORMAL HIGH (ref 65–99)
Potassium: 5.1 mmol/L (ref 3.5–5.3)
Sodium: 137 mmol/L (ref 135–146)
Total Bilirubin: 0.4 mg/dL (ref 0.2–1.2)
Total Protein: 6.7 g/dL (ref 6.1–8.1)

## 2019-04-09 LAB — LIPID PANEL
Cholesterol: 179 mg/dL (ref <200)
HDL: 43 mg/dL (ref >=40)
LDL Cholesterol (Calc): 99 mg/dL (calc)
Non-HDL Cholesterol (Calc): 136 mg/dL (calc) — ABNORMAL HIGH (ref <130)
Total CHOL/HDL Ratio: 4.2 (calc) (ref <5.0)
Triglycerides: 244 mg/dL — ABNORMAL HIGH (ref <150)

## 2019-04-09 MED ORDER — ATORVASTATIN CALCIUM 20 MG PO TABS: 20.0000 mg | ORAL_TABLET | ORAL | 3 refills | Status: DC

## 2019-04-09 NOTE — Addendum Note (Signed)
Addended by: [name] D on: 04/09/2019 11:44 AM   Modules accepted: Orders

## 2019-04-16 ENCOUNTER — Other Ambulatory Visit: Payer: Self-pay | Admitting: Family Medicine

## 2019-04-20 DIAGNOSIS — R7989 Other specified abnormal findings of blood chemistry: Secondary | ICD-10-CM | POA: Diagnosis not present

## 2019-04-21 LAB — COMPLETE METABOLIC PANEL WITH GFR
AG Ratio: 1.7 (calc) (ref 1.0–2.5)
ALT: 16 U/L (ref 9–46)
AST: 19 U/L (ref 10–35)
Albumin: 4.3 g/dL (ref 3.6–5.1)
Alkaline phosphatase (APISO): 61 U/L (ref 35–144)
BUN/Creatinine Ratio: 14 (calc) (ref 6–22)
BUN: 21 mg/dL (ref 7–25)
CO2: 24 mmol/L (ref 20–32)
Calcium: 9.8 mg/dL (ref 8.6–10.3)
Chloride: 102 mmol/L (ref 98–110)
Creat: 1.5 mg/dL — ABNORMAL HIGH (ref 0.70–1.11)
GFR, Est African American: 48 mL/min/{1.73_m2} — ABNORMAL LOW (ref >=60)
GFR, Est Non African American: 42 mL/min/{1.73_m2} — ABNORMAL LOW (ref >=60)
Globulin: 2.5 g/dL (calc) (ref 1.9–3.7)
Glucose, Bld: 106 mg/dL — ABNORMAL HIGH (ref 65–99)
Potassium: 5.1 mmol/L (ref 3.5–5.3)
Sodium: 139 mmol/L (ref 135–146)
Total Bilirubin: 0.5 mg/dL (ref 0.2–1.2)
Total Protein: 6.8 g/dL (ref 6.1–8.1)

## 2019-06-08 DIAGNOSIS — E119 Type 2 diabetes mellitus without complications: Secondary | ICD-10-CM | POA: Diagnosis not present

## 2019-06-14 ENCOUNTER — Other Ambulatory Visit: Payer: Self-pay | Admitting: Cardiology

## 2019-06-14 DIAGNOSIS — Z20822 Contact with and (suspected) exposure to covid-19: Secondary | ICD-10-CM

## 2019-06-15 LAB — NOVEL CORONAVIRUS, NAA: SARS-CoV-2, NAA: NOT DETECTED

## 2019-06-23 ENCOUNTER — Emergency Department (HOSPITAL_COMMUNITY): Payer: Medicare Other

## 2019-06-23 ENCOUNTER — Telehealth: Payer: Self-pay | Admitting: Cardiology

## 2019-06-23 ENCOUNTER — Encounter (HOSPITAL_COMMUNITY): Payer: Self-pay | Admitting: Emergency Medicine

## 2019-06-23 ENCOUNTER — Emergency Department (HOSPITAL_COMMUNITY)
Admission: EM | Admit: 2019-06-23 | Discharge: 2019-06-23 | Disposition: A | Discharge status: Home / Self Care | Payer: Medicare Other | Attending: Emergency Medicine | Admitting: Emergency Medicine

## 2019-06-23 ENCOUNTER — Other Ambulatory Visit: Payer: Self-pay

## 2019-06-23 DIAGNOSIS — Z20828 Contact with and (suspected) exposure to other viral communicable diseases: Secondary | ICD-10-CM | POA: Diagnosis not present

## 2019-06-23 DIAGNOSIS — Z79899 Other long term (current) drug therapy: Secondary | ICD-10-CM | POA: Diagnosis not present

## 2019-06-23 DIAGNOSIS — Z7984 Long term (current) use of oral hypoglycemic drugs: Secondary | ICD-10-CM | POA: Insufficient documentation

## 2019-06-23 DIAGNOSIS — E1122 Type 2 diabetes mellitus with diabetic chronic kidney disease: Secondary | ICD-10-CM | POA: Diagnosis not present

## 2019-06-23 DIAGNOSIS — J449 Chronic obstructive pulmonary disease, unspecified: Secondary | ICD-10-CM | POA: Diagnosis not present

## 2019-06-23 DIAGNOSIS — R002 Palpitations: Secondary | ICD-10-CM

## 2019-06-23 DIAGNOSIS — I129 Hypertensive chronic kidney disease with stage 1 through stage 4 chronic kidney disease, or unspecified chronic kidney disease: Secondary | ICD-10-CM | POA: Diagnosis not present

## 2019-06-23 DIAGNOSIS — N182 Chronic kidney disease, stage 2 (mild): Secondary | ICD-10-CM | POA: Diagnosis not present

## 2019-06-23 LAB — CBC
HCT: 41.8 % (ref 39.0–52.0)
Hemoglobin: 13.2 g/dL (ref 13.0–17.0)
MCH: 29.6 pg (ref 26.0–34.0)
MCHC: 31.6 g/dL (ref 30.0–36.0)
MCV: 93.7 fL (ref 80.0–100.0)
Platelets: 224 10*3/uL (ref 150–400)
RBC: 4.46 MIL/uL (ref 4.22–5.81)
RDW: 13.4 % (ref 11.5–15.5)
WBC: 6.9 10*3/uL (ref 4.0–10.5)
nRBC: 0 % (ref 0.0–0.2)

## 2019-06-23 LAB — BASIC METABOLIC PANEL
Anion gap: 11 (ref 5–15)
BUN: 20 mg/dL (ref 8–23)
CO2: 23 mmol/L (ref 22–32)
Calcium: 8.7 mg/dL — ABNORMAL LOW (ref 8.9–10.3)
Chloride: 105 mmol/L (ref 98–111)
Creatinine, Ser: 1.38 mg/dL — ABNORMAL HIGH (ref 0.61–1.24)
GFR calc Af Amer: 53 mL/min — ABNORMAL LOW (ref >60)
GFR calc non Af Amer: 46 mL/min — ABNORMAL LOW (ref >60)
Glucose, Bld: 146 mg/dL — ABNORMAL HIGH (ref 70–99)
Potassium: 4.4 mmol/L (ref 3.5–5.1)
Sodium: 139 mmol/L (ref 135–145)

## 2019-06-23 LAB — TROPONIN I (HIGH SENSITIVITY)
Troponin I (High Sensitivity): 8 ng/L (ref <18)
Troponin I (High Sensitivity): 8 ng/L (ref <18)

## 2019-06-23 LAB — POC SARS CORONAVIRUS 2 AG -  ED: SARS Coronavirus 2 Ag: NEGATIVE

## 2019-06-23 MED ORDER — METOPROLOL TARTRATE 25 MG PO TABS
50.0000 mg | ORAL_TABLET | Freq: Once | ORAL | Status: AC
  Administered 2019-06-23: 50 mg via ORAL
  Filled 2019-06-23: qty 2

## 2019-06-23 NOTE — Telephone Encounter (Signed)
New message:     Patient calling because he states that his heart is racing and would like to see some one however his wife has covid. There was a appt tomorrow but with him being exposed. Please call patient back.

## 2019-06-23 NOTE — ED Triage Notes (Signed)
Pt reports he has no problems today but every night he feels like he has ran 40 miles. Denies pain, reports he feels his heart racing when he lays flat. Reports his wife has tested positive but he has not had any symptoms and had a negative test.

## 2019-06-23 NOTE — ED Provider Notes (Signed)
[facility] EMERGENCY DEPARTMENT Provider Note   CSN: [id number] Arrival date & time: 06/23/19  1239     History Chief Complaint  Patient presents with  . Palpitations    Clayton Harrison is a 85 y.o. male.  The history is provided by the patient and medical records. No language interpreter was used.  Palpitations  Clayton Harrison is a 85 y.o. male who presents to the Emergency Department complaining of palpitations.  For the last few weeks he has been waking up feeling like his heart is trying to come out of his chest.  Sxs are waking him in the morning.  It does not happen every night.  Last occurrence was two nights ago.  Denies diaphoresis, sob, chest pain, fever, cough, n/v/d, loss of taste/smell, leg swelling or pain.  He does have fatigue (ongoing issue).  Wife tested positive for covid19 on 12/21.  Episodes last for seconds at a time. Lives at home with his wife.     Did not take his home medications today.  States he has white coat hypertension.    Past Medical History:  Diagnosis Date  . Adenomatous colon polyp    2.5cm excised by right colectomy 2011  . Anemia, blood loss    history of  . Blockage of coronary artery of heart ([place]) 2001   x 4  . CAD (coronary artery disease)   . Carotid artery occlusion   . Cataract   . Cerebrovascular disease, unspecified   . Colon tumor 03/2010   right ascending  . Diabetic retinopathy ([place]) 09/29/2017  . DM (diabetes mellitus) ([place])   . Elevated PSA   . GERD (gastroesophageal reflux disease)   . Hearing loss   . HTN (hypertension)   . Hypercholesteremia   . Interstitial cystitis   . Macular degeneration   . Other and unspecified hyperlipidemia   . Prostate cancer ([place]) 03/09/14   Gleason 4+5=9, volume 98 mL  . PVD (peripheral vascular disease) ([place])   . S/P radiation therapy 07/20/2014 through 09/13/2014    Prostate 7800 cGy in 40 sessions, seminal vesicles  5600 cGy in 40 sessions   . SBO (small bowel obstruction) ([place]) 07/2013  . Skin cancer 2012   melanoma upper back, removed  . Statin intolerance   . Vertigo   . Vertigo     Patient Active Problem List   Diagnosis Date Noted  . Gait instability 04/08/2019  . Dizziness 01/05/2019  . Herpes simplex viral infection 06/30/2018  . Sebaceous cyst 06/30/2018  . Chronic obstructive pulmonary disease ([place]) 04/17/2017  . Restrictive lung disease 04/17/2017  . Aortic atherosclerosis ([place]) 03/26/2016  . Carpal tunnel syndrome, right 11/16/2015  . Primary osteoarthritis of right wrist 11/16/2015  . Spinal stenosis of lumbar region 01/17/2015  . Chronic kidney disease (CKD) stage G2/A2, mildly decreased glomerular filtration rate (GFR) between 60-89 mL/min/1.73 square meter and albuminuria creatinine ratio between 30-299 mg/g 05/27/2014  . H/O right hemicolectomy 08/06/2013  . Metabolic acidosis 123456  . HOH (hard of hearing) 08/06/2013  . History of small bowel obstruction 08/06/2013  . Palpitations 10/27/2012  . BPH (benign prostatic hyperplasia) 10/15/2012  . Cataracts, bilateral 08/13/2012  . Macular degeneration 08/13/2012  . Overweight (BMI 25.0-29.9) 08/13/2012  . Carotid artery occlusion without infarction 08/13/2011  . Statin intolerance 03/20/2011  . ANEMIA-UNSPECIFIED 02/15/2010  . GERD 02/15/2010  . Peripheral vascular disease ([place]) 06/02/2009  . Cerebrovascular disease 12/10/2008  . DM (diabetes mellitus) with peripheral vascular complication (  [place]) 01/16/2007  . Hyperlipemia 01/16/2007  . Essential hypertension 01/16/2007  . Coronary atherosclerosis 01/16/2007  . INTERSTITIAL CYSTITIS 01/16/2007  . Prostatic adenocarcinoma ([place]) 01/16/2007    Past Surgical History:  Procedure Laterality Date  . APPENDECTOMY  05/2010   with right hemicolectomy  . CARDIAC CATHETERIZATION  11/1999  . CAROTID ENDARTERECTOMY  2012   right  . CORONARY ARTERY BYPASS  GRAFT  2001   4  . dental extractions Bilateral 1980  . DENTAL SURGERY    . [place]  . left carpal tunnel release  2009  . PARTIAL COLECTOMY  05/24/2010   Lap converted to open right proximal colectomy  . PROSTATE BIOPSY  03/09/14   Gleason 9, vol 98 mL  . SKIN CANCER EXCISION  02/2011   upper back  . TONSILLECTOMY  1940  . URETEROLITHOTOMY         Family History  Problem Relation Age of Onset  . Diabetes Mother   . Heart disease Mother   . Heart failure Mother   . Hypertension Mother   . Heart disease Father   . Cancer Cousin        colon  . Colon cancer Maternal Uncle        x4  . Cancer Maternal Uncle        colon  . Cancer Daughter        breast  . Cancer Daughter        breast    Social History   Tobacco Use  . Smoking status: Former Smoker    Packs/day: 2.00    Years: 40.00    Pack years: 80.00    Quit date: 11/23/1999    Years since quitting: 19.5  . Smokeless tobacco: Current User    Types: Chew  Substance Use Topics  . Alcohol use: No  . Drug use: No    Home Medications Prior to Admission medications   Medication Sig Start Date End Date Taking? Authorizing Provider  acetaminophen (TYLENOL) 500 MG tablet Take 1,000 mg by mouth every 6 (six) hours as needed for mild pain.    [provider]  Albuterol Sulfate (PROAIR RESPICLICK) 123XX123 (90 Base) MCG/ACT AEPB Inhale 2 puffs into the lungs every 6 (six) hours as needed. 01/19/18   Emeterio Reeve, DO  amLODipine (NORVASC) 10 MG tablet Take 1 tablet (10 mg total) by mouth daily. 01/01/19   Hali Marry, MD  aspirin 325 MG tablet Take 325 mg by mouth daily.      [provider]  atorvastatin (LIPITOR) 20 MG tablet Take 1 tablet (20 mg total) by mouth 2 (two) times a week. At bedtime for cholesterol 04/12/19   Hali Marry, MD  DULoxetine (CYMBALTA) 60 MG capsule Take 1 capsule (60 mg total) by mouth daily. 01/01/19   Hali Marry, MD  glipiZIDE  (GLUCOTROL) 10 MG tablet TAKE 1 TABLET BY MOUTH  TWICE A DAY BEFORE MEALS 02/26/19   Hali Marry, MD  Insulin Glargine, 1 Unit Dial, (TOUJEO SOLOSTAR) 300 UNIT/ML SOPN Inject 38 Units into the skin at bedtime. Patient taking differently: Inject 34 Units into the skin at bedtime.  03/29/19   Hali Marry, MD  Insulin Pen Needle (BD PEN NEEDLE NANO U/F) 32G X 4 MM MISC INJECT SUBCUTANEOUSLY DAILY 11/09/18   Hali Marry, MD  lisinopril (ZESTRIL) 20 MG tablet Take 1 tablet (20 mg total) by mouth at bedtime. 02/26/19   Hali Marry,  MD  meclizine (ANTIVERT) 25 MG tablet TAKE ONE-HALF TABLET BY  MOUTH TWO TIMES DAILY AS  NEEDED FOR DIZZINESS 03/25/17   Hali Marry, MD  metFORMIN (GLUCOPHAGE) 1000 MG tablet TAKE 1 TABLET BY MOUTH  TWICE DAILY WITH MEALS 04/16/19   Hali Marry, MD  metoprolol tartrate (LOPRESSOR) 50 MG tablet Take 1 tablet (50 mg total) by mouth 2 (two) times daily. 12/17/18   Hali Marry, MD  tamsulosin (FLOMAX) 0.4 MG CAPS capsule Take 0.4 mg by mouth daily. 03/20/19   [provider]  traMADol (ULTRAM) 50 MG tablet TAKE 1 TABLET BY MOUTH  DAILY AS NEEDED 10/13/17   Hali Marry, MD  umeclidinium bromide (INCRUSE ELLIPTA) 62.5 MCG/INH AEPB Inhale 1 puff into the lungs daily. 01/05/19   Hali Marry, MD  valACYclovir (VALTREX) 1000 MG tablet TAKE HALF TABLET BY MOUTH 2 TIMES DAILY. X 5 DAYS AS  NEEDED FOR OUTBREAK. 08/06/18   Hali Marry, MD    Allergies    Shellfish allergy, Statins, Ezetimibe, Flomax [tamsulosin hcl], Prednisone, and Tizanidine  Review of Systems   Review of Systems  Cardiovascular: Positive for palpitations.  All other systems reviewed and are negative.   Physical Exam Updated Vital Signs BP (!) 204/87   Pulse 66   Temp 98.1 F (36.7 C) (Oral)   Resp 18   Ht 5\' 11"  (1.803 m)   Wt 95.3 kg   SpO2 96%   BMI 29.29 kg/m   Physical Exam Vitals and nursing note  reviewed.  Constitutional:      Appearance: He is well-developed.  HENT:     Head: Normocephalic and atraumatic.  Cardiovascular:     Rate and Rhythm: Normal rate and regular rhythm.     Heart sounds: No murmur.  Pulmonary:     Effort: Pulmonary effort is normal. No respiratory distress.     Breath sounds: Normal breath sounds.  Abdominal:     Palpations: Abdomen is soft.     Tenderness: There is no abdominal tenderness. There is no guarding or rebound.  Musculoskeletal:        General: No tenderness.  Skin:    General: Skin is warm and dry.  Neurological:     Mental Status: He is alert and oriented to person, place, and time.  Psychiatric:        Behavior: Behavior normal.     ED Results / Procedures / Treatments   Labs (all labs ordered are listed, but only abnormal results are displayed) Labs Reviewed  BASIC METABOLIC PANEL - Abnormal; Notable for the following components:      Result Value   Glucose, Bld 146 (*)    Creatinine, Ser 1.38 (*)    Calcium 8.7 (*)    GFR calc non Af Amer 46 (*)    GFR calc Af Amer 53 (*)    All other components within normal limits  NOVEL CORONAVIRUS, NAA (HOSP ORDER, SEND-OUT TO REF LAB; TAT 18-24 HRS)  CBC  POC SARS CORONAVIRUS 2 AG -  ED  TROPONIN I (HIGH SENSITIVITY)  TROPONIN I (HIGH SENSITIVITY)    EKG EKG Interpretation  Date/Time:  Wednesday June 23 2019 12:45:29 EST Ventricular Rate:  78 PR Interval:  146 QRS Duration: 134 QT Interval:  426 QTC Calculation: 485 R Axis:   114 Text Interpretation: Normal sinus rhythm Right bundle branch block Left posterior fascicular block ** Bifascicular block ** Abnormal ECG Confirmed by Quintella Reichert [phone]) on 06/23/2019 5:14:35  PM   Radiology DG Chest 2 View  Result Date: 06/23/2019 CLINICAL DATA:  Palpitations. EXAM: CHEST - 2 VIEW COMPARISON:  Chest x-ray dated January 09, 2011. FINDINGS: The heart is at the upper limits of normal in size status post CABG. Atherosclerotic  calcification of the aortic arch. Normal pulmonary vascularity. Chronic mildly coarsened interstitial markings, most notable in the right upper lobe, are similar to prior study. No focal consolidation, pleural effusion, or pneumothorax. No acute osseous abnormality. IMPRESSION: No active cardiopulmonary disease. Electronically Signed   By: Titus Dubin M.D.   On: 06/23/2019 13:19    Procedures Procedures (including critical care time)  Medications Ordered in ED Medications  metoprolol tartrate (LOPRESSOR) tablet 50 mg (50 mg Oral Given 06/23/19 1829)    ED Course  I have reviewed the triage vital signs and the nursing notes.  Pertinent labs & imaging results that were available during my care of the patient were reviewed by me and considered in my medical decision making (see chart for details).    MDM Rules/Calculators/A&P                     Patient here for evaluation of intermittent palpitations. He is hypertensive in the emergency department but is asymptomatic at this time. He has been exposed to coronavirus but is tested negative. EKG is stable when compared to priors. Troponin is negative times two. Presentation is not consistent with ACS, acute PE, acute CHF, pneumonia. Discussed with patient home care for palpitations. Discussed continued some medications as prescribed as well as cardiology follow-up and return precautions.  Clayton Harrison was evaluated in Emergency Department on 06/23/2019 for the symptoms described in the history of present illness. He was evaluated in the context of the global COVID-19 pandemic, which necessitated consideration that the patient might be at risk for infection with the SARS-CoV-2 virus that causes COVID-19. Institutional protocols and algorithms that pertain to the evaluation of patients at risk for COVID-19 are in a state of rapid change based on information released by regulatory bodies including the CDC and federal and state organizations. These  policies and algorithms were followed during the patient's care in the ED.  Final Clinical Impression(s) / ED Diagnoses Final diagnoses:  Palpitations    Rx / DC Orders ED Discharge Orders    None       Quintella Reichert, MD 06/23/19 2343

## 2019-06-23 NOTE — Telephone Encounter (Signed)
Pt will go to ER because he has been exposed to wife with [place].

## 2019-06-24 LAB — NOVEL CORONAVIRUS, NAA (HOSP ORDER, SEND-OUT TO REF LAB; TAT 18-24 HRS): SARS-CoV-2, NAA: NOT DETECTED

## 2019-07-05 NOTE — Progress Notes (Signed)
Virtual Visit via Video Note   This visit type was conducted due to national recommendations for restrictions regarding the COVID-19 Pandemic (e.g. social distancing) in an effort to limit this patient's exposure and mitigate transmission in our community.  Due to his co-morbid illnesses, this patient is at least at moderate risk for complications without adequate follow up.  This format is felt to be most appropriate for this patient at this time.  All issues noted in this document were discussed and addressed.  A limited physical exam was performed with this format.  Please refer to the patient's chart for his consent to telehealth for [facility].   Date:  07/06/2019   ID:  Clayton Harrison, DOB May 29, 1931, MRN [id number]  Patient Location:Home Provider Location: Home  PCP:  Hali Marry, MD  Cardiologist:  Dr Stanford Breed  Evaluation Performed:  New Patient Evaluation  Chief Complaint:  Evaluate CAD  History of Present Illness:    86 year old male for evaluation of coronary artery disease at request of [name], MD. Seen previously but not since January 2017. Pt is status post coronary artery bypass graft in June 2001. Abdominal ultrasound in June of 2006 showed no aneurysm. Nuclear study December 2016 showed ejection fraction 61%, apical thinning but no ischemia. Carotid Dopplers 9/20 showed 1-39 bilateral stenosis.  Seen in ER December 2020 with complaints of palpitations.  Patient's wife tested positive for COVID-19 on December 21.  Patient's Covid test was negative, chest x-ray with no infiltrate, troponins normal and potassium 4.4. Electrocardiogram showed normal sinus rhythm and right bundle branch block.  Patient states that for the past several weeks he has been awakened from sleep at night with palpitations that last approximately 30 seconds to 1 minute.  There is no syncope.  He denies increased dyspnea on exertion though he has some related to COPD.  He denies  chest pain.  The patient does not have symptoms concerning for COVID-19 infection (fever, chills, cough, or new shortness of breath).    Past Medical History:  Diagnosis Date   Adenomatous colon polyp    2.5cm excised by right colectomy 2011   Anemia, blood loss    history of   Blockage of coronary artery of heart ([place]) 2001   x 4   CAD (coronary artery disease)    Carotid artery occlusion    Cataract    Cerebrovascular disease, unspecified    Colon tumor 03/2010   right ascending   Diabetic retinopathy ([place]) 09/29/2017   DM (diabetes mellitus) ([facility])    Elevated PSA    GERD (gastroesophageal reflux disease)    Hearing loss    HTN (hypertension)    Hypercholesteremia    Interstitial cystitis    Macular degeneration    Other and unspecified hyperlipidemia    Prostate cancer ([place]) 03/09/14   Gleason 4+5=9, volume 98 mL   PVD (peripheral vascular disease) ([place])    S/P radiation therapy 07/20/2014 through 09/13/2014    Prostate 7800 cGy in 40 sessions, seminal vesicles 5600 cGy in 40 sessions    SBO (small bowel obstruction) ([place]) 07/2013   Skin cancer 2012   melanoma upper back, removed   Statin intolerance    Vertigo    Vertigo    Past Surgical History:  Procedure Laterality Date   APPENDECTOMY  05/2010   with right hemicolectomy   CARDIAC CATHETERIZATION  11/1999   CAROTID ENDARTERECTOMY  2012   right   CORONARY ARTERY BYPASS GRAFT  2001  4   dental extractions Bilateral [place]   left carpal tunnel release  2009   PARTIAL COLECTOMY  05/24/2010   Lap converted to open right proximal colectomy   PROSTATE BIOPSY  03/09/14   Gleason 9, vol 98 mL   SKIN CANCER EXCISION  02/2011   upper back   TONSILLECTOMY  1940   URETEROLITHOTOMY       Current Meds  Medication Sig   acetaminophen (TYLENOL) 500 MG tablet  Take 1,000 mg by mouth every 6 (six) hours as needed for mild pain.   Albuterol Sulfate (PROAIR RESPICLICK) 123XX123 (90 Base) MCG/ACT AEPB Inhale 2 puffs into the lungs every 6 (six) hours as needed.   amLODipine (NORVASC) 10 MG tablet Take 1 tablet (10 mg total) by mouth daily.   atorvastatin (LIPITOR) 20 MG tablet Take 1 tablet (20 mg total) by mouth 2 (two) times a week. At bedtime for cholesterol   DULoxetine (CYMBALTA) 60 MG capsule Take 1 capsule (60 mg total) by mouth daily.   glipiZIDE (GLUCOTROL) 10 MG tablet TAKE 1 TABLET BY MOUTH  TWICE A DAY BEFORE MEALS   Insulin Glargine, 1 Unit Dial, (TOUJEO SOLOSTAR) 300 UNIT/ML SOPN Inject 38 Units into the skin at bedtime. (Patient taking differently: Inject 34 Units into the skin at bedtime. )   Insulin Pen Needle (BD PEN NEEDLE NANO U/F) 32G X 4 MM MISC INJECT SUBCUTANEOUSLY DAILY   lisinopril (ZESTRIL) 20 MG tablet Take 1 tablet (20 mg total) by mouth at bedtime.   meclizine (ANTIVERT) 25 MG tablet TAKE ONE-HALF TABLET BY  MOUTH TWO TIMES DAILY AS  NEEDED FOR DIZZINESS   metFORMIN (GLUCOPHAGE) 1000 MG tablet TAKE 1 TABLET BY MOUTH  TWICE DAILY WITH MEALS   metoprolol tartrate (LOPRESSOR) 50 MG tablet Take 1 tablet (50 mg total) by mouth 2 (two) times daily.   tamsulosin (FLOMAX) 0.4 MG CAPS capsule Take 0.4 mg by mouth daily.   traMADol (ULTRAM) 50 MG tablet TAKE 1 TABLET BY MOUTH  DAILY AS NEEDED   umeclidinium bromide (INCRUSE ELLIPTA) 62.5 MCG/INH AEPB Inhale 1 puff into the lungs daily.   valACYclovir (VALTREX) 1000 MG tablet TAKE HALF TABLET BY MOUTH 2 TIMES DAILY. X 5 DAYS AS  NEEDED FOR OUTBREAK.   [DISCONTINUED] aspirin 325 MG tablet Take 325 mg by mouth daily.       Allergies:   Shellfish allergy, Statins, Ezetimibe, Flomax [tamsulosin hcl], Prednisone, and Tizanidine   Social History   Tobacco Use   Smoking status: Former Smoker    Packs/day: 2.00    Years: 40.00    Pack years: 80.00    Quit date: 11/23/1999     Years since quitting: 19.6   Smokeless tobacco: Current User    Types: Chew  Substance Use Topics   Alcohol use: No   Drug use: No     Family Hx: The patient's family history includes Cancer in his cousin, daughter, daughter, and maternal uncle; Colon cancer in his maternal uncle; Diabetes in his mother; Heart disease in his father and mother; Heart failure in his mother; Hypertension in his mother.  ROS:   Please see the history of present illness.    No Fever, chills  or productive cough All other systems reviewed and are negative.   Labs/Other Tests and Data Reviewed:    EKG:  An ECG dated 06/23/19 was personally reviewed today and demonstrated:  Normal sinus rhythm,  right bundle branch block.  Recent Labs: 01/05/2019: Magnesium 1.7; TSH 2.02 04/20/2019: ALT 16 06/23/2019: BUN 20; Creatinine, Ser 1.38; Hemoglobin 13.2; Platelets 224; Potassium 4.4; Sodium 139   Recent Lipid Panel Lab Results  Component Value Date/Time   CHOL 179 04/08/2019 03:51 PM   TRIG 244 (H) 04/08/2019 03:51 PM   TRIG 202 (HH) 06/05/2006 08:28 AM   HDL 43 04/08/2019 03:51 PM   CHOLHDL 4.2 04/08/2019 03:51 PM   LDLCALC 99 04/08/2019 03:51 PM   LDLDIRECT 104.2 03/06/2012 08:45 AM    Wt Readings from Last 3 Encounters:  07/06/19 205 lb (93 kg)  06/23/19 210 lb (95.3 kg)  04/08/19 213 lb (96.6 kg)     Objective:    Vital Signs:  BP 136/89    Pulse 73    Ht 5\' 10"  (1.778 m)    Wt 205 lb (93 kg)    BMI 29.41 kg/m    VITAL SIGNS:  reviewed NAD Answers questions appropriately Normal affect Remainder of physical examination not performed (telehealth visit; coronavirus pandemic)  ASSESSMENT & PLAN:    1. Palpitations-etiology unclear.  We will arrange an event monitor to further assess.  I will schedule an echocardiogram to reassess LV function. 2. Coronary artery disease status post coronary artery bypass graft-patient denies chest pain.  Continue medical therapy with aspirin (decrease to 81  mg daily) and statin. 3. Carotid artery disease-plan follow-up carotid Doppler September 2021. 4. Hypertension-BP controlled; continue present meds. 5. Hyperlipidemia-continue lipitor.  COVID-19 Education: The importance of social distancing was discussed today.  Time:   Today, I have spent 17 minutes with the patient with telehealth technology discussing the above problems.     Medication Adjustments/Labs and Tests Ordered: Current medicines are reviewed at length with the patient today.  Concerns regarding medicines are outlined above.   Tests Ordered: No orders of the defined types were placed in this encounter.   Medication Changes: No orders of the defined types were placed in this encounter.   Follow Up:  Either In Person or Virtual in 6 week(s)  Signed, Kirk Ruths, MD  07/06/2019 9:29 AM    [place]

## 2019-07-06 ENCOUNTER — Telehealth: Payer: Self-pay | Admitting: *Deleted

## 2019-07-06 ENCOUNTER — Encounter: Payer: Self-pay | Admitting: Cardiology

## 2019-07-06 ENCOUNTER — Telehealth (INDEPENDENT_AMBULATORY_CARE_PROVIDER_SITE_OTHER): Payer: Medicare Other | Admitting: Cardiology

## 2019-07-06 VITALS — BP 136/89 | HR 73 | Ht 70.0 in | Wt 205.0 lb

## 2019-07-06 DIAGNOSIS — I10 Essential (primary) hypertension: Secondary | ICD-10-CM

## 2019-07-06 DIAGNOSIS — E7800 Pure hypercholesterolemia, unspecified: Secondary | ICD-10-CM

## 2019-07-06 DIAGNOSIS — R002 Palpitations: Secondary | ICD-10-CM | POA: Diagnosis not present

## 2019-07-06 DIAGNOSIS — I2510 Atherosclerotic heart disease of native coronary artery without angina pectoris: Secondary | ICD-10-CM

## 2019-07-06 MED ORDER — ASPIRIN EC 81 MG PO TBEC: 81.0000 mg | DELAYED_RELEASE_TABLET | Freq: Every day | ORAL | 3 refills | Status: AC

## 2019-07-06 NOTE — Telephone Encounter (Signed)
[employer] to ship a 30 day cardiac event monitor to the patients home.  Instructions will be included in the monitor kit. 

## 2019-07-06 NOTE — Patient Instructions (Signed)
Medication Instructions:  DECREASE ASPIRIN TO 81 MG ONCE DAILY  *If you need a refill on your cardiac medications before your next appointment, please call your pharmacy*  Lab Work: If you have labs (blood work) drawn today and your tests are completely normal, you will receive your results only by: [facility] MyChart Message (if you have MyChart) OR . A paper copy in the mail If you have any lab test that is abnormal or we need to change your treatment, we will call you to review the results.  Testing/Procedures: Your physician has requested that you have an echocardiogram. Echocardiography is a painless test that uses sound waves to create images of your heart. It provides your doctor with information about the size and shape of your heart and how well your heart's chambers and valves are working. This procedure takes approximately one hour. There are no restrictions for this procedure.[place] has recommended that you wear a 30 DAY event monitor. Event monitors are medical devices that record the heart's electrical activity. Doctors most often [place] these monitors to diagnose arrhythmias. Arrhythmias are problems with the speed or rhythm of the heartbeat. The monitor is a small, portable device. You can wear one while you do your normal daily activities. This is usually used to diagnose what is causing palpitations/syncope (passing out).WILL BE MAILED TO YOUR HOME     Follow-Up: At [facility], you and your health needs are our priority.  As part of our continuing mission to provide you with exceptional heart care, we have created designated Provider Care Teams.  These Care Teams include your primary Cardiologist (physician) and Advanced Practice Providers (APPs -  Physician Assistants and Nurse Practitioners) who all work together to provide you with the care you need, when you need it.  Your next appointment:   6 week(s)  The format for your next appointment:   In  Person  Provider:   Kirk Ruths, MD

## 2019-07-12 ENCOUNTER — Telehealth: Payer: Self-pay | Admitting: Cardiology

## 2019-07-12 NOTE — Telephone Encounter (Signed)
[employer] was called regarding status of monitor delivery.  Tracey at [place] stated the monitor may be delivered as early as 1/19 and up to 1/22.  He was not able to supply a tracking number.  Patient was left [employer] telephone number if he wished to contact the company directly.

## 2019-07-12 NOTE — Telephone Encounter (Signed)
New Message   Pt called and stated that his monitor isn't in yet and want to know what the hold up is  Please call to discuss

## 2019-07-15 ENCOUNTER — Ambulatory Visit (HOSPITAL_COMMUNITY): Payer: Medicare Other | Attending: Cardiology

## 2019-07-15 ENCOUNTER — Other Ambulatory Visit: Payer: Self-pay

## 2019-07-15 DIAGNOSIS — R002 Palpitations: Secondary | ICD-10-CM | POA: Diagnosis not present

## 2019-07-15 DIAGNOSIS — I2510 Atherosclerotic heart disease of native coronary artery without angina pectoris: Secondary | ICD-10-CM | POA: Diagnosis not present

## 2019-07-16 ENCOUNTER — Encounter (INDEPENDENT_AMBULATORY_CARE_PROVIDER_SITE_OTHER): Payer: Medicare Other

## 2019-07-16 DIAGNOSIS — R002 Palpitations: Secondary | ICD-10-CM

## 2019-07-21 ENCOUNTER — Ambulatory Visit: Payer: Medicare Other

## 2019-07-26 ENCOUNTER — Other Ambulatory Visit: Payer: Self-pay | Admitting: Family Medicine

## 2019-07-27 ENCOUNTER — Encounter: Payer: Self-pay | Admitting: Family Medicine

## 2019-08-10 ENCOUNTER — Ambulatory Visit: Payer: Medicare Other | Admitting: Family Medicine

## 2019-08-11 NOTE — Progress Notes (Signed)
HPI: FU CAD. Pt is status post coronary artery bypass graft in June 2001. Abdominal ultrasound in June of 2006 showed no aneurysm. Nuclear study December 2016 showed ejection fraction 61%, apical thinning but no ischemia. Carotid Dopplers 9/20 showed 1-39 bilateral stenosis. Seen recently for episodes of palpitations.  Echocardiogram January 2021 showed normal LV function, mild left ventricular hypertrophy, grade 1 diastolic dysfunction, mild left ventricular enlargement.  Monitor ordered at previous office visit.  Since last seen he denies dyspnea on exertion, orthopnea, PND, chest pain or syncope.  He has some dizziness with standing.  His palpitations have improved after discontinuing Lipitor.  Current Outpatient Medications  Medication Sig Dispense Refill  . acetaminophen (TYLENOL) 500 MG tablet Take 1,000 mg by mouth every 6 (six) hours as needed for mild pain.    . Albuterol Sulfate (PROAIR RESPICLICK) 123XX123 (90 Base) MCG/ACT AEPB Inhale 2 puffs into the lungs every 6 (six) hours as needed. 3 each 1  . amLODipine (NORVASC) 10 MG tablet Take 1 tablet (10 mg total) by mouth daily. 90 tablet 2  . aspirin EC 81 MG tablet Take 1 tablet (81 mg total) by mouth daily. 90 tablet 3  . DULoxetine (CYMBALTA) 60 MG capsule TAKE 1 CAPSULE BY MOUTH  DAILY 90 capsule 3  . glipiZIDE (GLUCOTROL) 10 MG tablet TAKE 1 TABLET BY MOUTH  TWICE A DAY BEFORE MEALS 180 tablet 3  . Insulin Glargine, 1 Unit Dial, (TOUJEO SOLOSTAR) 300 UNIT/ML SOPN Inject 38 Units into the skin at bedtime. (Patient taking differently: Inject 34 Units into the skin at bedtime. ) 4.5 mL 6  . Insulin Pen Needle (BD PEN NEEDLE NANO U/F) 32G X 4 MM MISC INJECT SUBCUTANEOUSLY DAILY 90 each 3  . lisinopril (ZESTRIL) 20 MG tablet TAKE 1 TABLET BY MOUTH AT  BEDTIME 90 tablet 3  . meclizine (ANTIVERT) 25 MG tablet TAKE ONE-HALF TABLET BY  MOUTH TWO TIMES DAILY AS  NEEDED FOR DIZZINESS 90 tablet 1  . metFORMIN (GLUCOPHAGE) 1000 MG tablet TAKE 1  TABLET BY MOUTH  TWICE DAILY WITH MEALS 180 tablet 3  . metoprolol tartrate (LOPRESSOR) 50 MG tablet Take 1 tablet (50 mg total) by mouth 2 (two) times daily. 180 tablet 3  . tamsulosin (FLOMAX) 0.4 MG CAPS capsule Take 0.4 mg by mouth daily.    . traMADol (ULTRAM) 50 MG tablet TAKE 1 TABLET BY MOUTH  DAILY AS NEEDED 90 tablet 3  . umeclidinium bromide (INCRUSE ELLIPTA) 62.5 MCG/INH AEPB Inhale 1 puff into the lungs daily. 90 each 3  . valACYclovir (VALTREX) 1000 MG tablet TAKE HALF TABLET BY MOUTH 2 TIMES DAILY. X 5 DAYS AS  NEEDED FOR OUTBREAK. 90 tablet 1   No current facility-administered medications for this visit.     Past Medical History:  Diagnosis Date  . Adenomatous colon polyp    2.5cm excised by right colectomy 2011  . Anemia, blood loss    history of  . Blockage of coronary artery of heart ([place]) 2001   x 4  . CAD (coronary artery disease)   . Carotid artery occlusion   . Cataract   . Cerebrovascular disease, unspecified   . Colon tumor 03/2010   right ascending  . Diabetic retinopathy ([place]) 09/29/2017  . DM (diabetes mellitus) ([place])   . Elevated PSA   . GERD (gastroesophageal reflux disease)   . Hearing loss   . HTN (hypertension)   . Hypercholesteremia   . Interstitial cystitis   .  Macular degeneration   . Other and unspecified hyperlipidemia   . Prostate cancer ([place]) 03/09/14   Gleason 4+5=9, volume 98 mL  . PVD (peripheral vascular disease) ([place])   . S/P radiation therapy 07/20/2014 through 09/13/2014    Prostate 7800 cGy in 40 sessions, seminal vesicles 5600 cGy in 40 sessions   . SBO (small bowel obstruction) ([place]) 07/2013  . Skin cancer 2012   melanoma upper back, removed  . Statin intolerance   . Vertigo   . Vertigo     Past Surgical History:  Procedure Laterality Date  . APPENDECTOMY  05/2010   with right hemicolectomy  . CARDIAC CATHETERIZATION  11/1999  . CAROTID  ENDARTERECTOMY  2012   right  . CORONARY ARTERY BYPASS GRAFT  2001   4  . dental extractions Bilateral 1980  . DENTAL SURGERY    . [place]  . left carpal tunnel release  2009  . PARTIAL COLECTOMY  05/24/2010   Lap converted to open right proximal colectomy  . PROSTATE BIOPSY  03/09/14   Gleason 9, vol 98 mL  . SKIN CANCER EXCISION  02/2011   upper back  . TONSILLECTOMY  1940  . URETEROLITHOTOMY      Social History   Socioeconomic History  . Marital status: Married    Spouse name: [name]  . Number of children: 2  . Years of education: HS  . Highest education level: Not on file  Occupational History  . Occupation: Retired  Tobacco Use  . Smoking status: Former Smoker    Packs/day: 2.00    Years: 40.00    Pack years: 80.00    Quit date: 11/23/1999    Years since quitting: 19.7  . Smokeless tobacco: Current User    Types: Chew  Substance and Sexual Activity  . Alcohol use: No  . Drug use: No  . Sexual activity: Yes    Partners: Female  Other Topics Concern  . Not on file  Social History Narrative   No regular exercise. Daily caffeine- 3-4 cups daily         Social Determinants of Health   Financial Resource Strain:   . Difficulty of Paying Living Expenses: Not on file  Food Insecurity:   . Worried About Charity fundraiser in the Last Year: Not on file  . Ran Out of Food in the Last Year: Not on file  Transportation Needs:   . Lack of Transportation (Medical): Not on file  . Lack of Transportation (Non-Medical): Not on file  Physical Activity:   . Days of Exercise per Week: Not on file  . Minutes of Exercise per Session: Not on file  Stress:   . Feeling of Stress : Not on file  Social Connections:   . Frequency of Communication with Friends and Family: Not on file  . Frequency of Social Gatherings with Friends and Family: Not on file  . Attends Religious Services: Not on file  . Active Member of Clubs or Organizations: Not on file  .  Attends Archivist Meetings: Not on file  . Marital Status: Not on file  Intimate Partner Violence:   . Fear of Current or Ex-Partner: Not on file  . Emotionally Abused: Not on file  . Physically Abused: Not on file  . Sexually Abused: Not on file    Family History  Problem Relation Age of Onset  . Diabetes Mother   . Heart disease Mother   . Heart  failure Mother   . Hypertension Mother   . Heart disease Father   . Cancer Cousin        colon  . Colon cancer Maternal Uncle        x4  . Cancer Maternal Uncle        colon  . Cancer Daughter        breast  . Cancer Daughter        breast    ROS: no fevers or chills, productive cough, hemoptysis, dysphasia, odynophagia, melena, hematochezia, dysuria, hematuria, rash, seizure activity, orthopnea, PND, pedal edema, claudication. Remaining systems are negative.  Physical Exam: Well-developed well-nourished in no acute distress.  Skin is warm and dry.  HEENT is normal.  Neck is supple.  Chest is clear to auscultation with normal expansion.  Cardiovascular exam is regular rate and rhythm.  Abdominal exam nontender or distended. No masses palpated. Extremities show no edema. neuro grossly intact  A/P  1 palpitations-LV function normal on most recent echocardiogram.  Etiology unclear.  He apparently had some symptoms with monitor in place.  We will await final results.  2 coronary artery disease status post coronary artery bypass graft-patient has not had chest pain.  Continue medical therapy with aspirin and resume statin.  3 hypertension-blood pressure elevated.  However I am hesitant to increase his medical regimen as he does have some orthostatic symptoms.  We will follow and adjust regimen as needed.  4 hyperlipidemia-he feels as though Lipitor was causing palpitations.  I will try Crestor 10 mg daily to see if he tolerates.  Check lipids and liver in 12 weeks.  If he does not tolerate we will consider Repatha.   5 carotid artery disease-patient will need follow-up carotid Dopplers September 2021.  Kirk Ruths, MD

## 2019-08-16 ENCOUNTER — Telehealth: Payer: Self-pay | Admitting: Cardiology

## 2019-08-16 NOTE — Telephone Encounter (Signed)
Patient's wife is calling requesting she attend the patients upcoming appointment tomorrow 08/17/19 due to the patient having hearing trouble and not seeing well. Please advise.

## 2019-08-16 NOTE — Telephone Encounter (Signed)
Left detailed message (ok per DPR) to make aware ok to come to appt.

## 2019-08-17 ENCOUNTER — Other Ambulatory Visit: Payer: Self-pay

## 2019-08-17 ENCOUNTER — Ambulatory Visit: Payer: Medicare Other | Admitting: Cardiology

## 2019-08-17 ENCOUNTER — Encounter: Payer: Self-pay | Admitting: Cardiology

## 2019-08-17 VITALS — BP 158/72 | HR 73 | Ht 71.0 in | Wt 212.0 lb

## 2019-08-17 DIAGNOSIS — I2510 Atherosclerotic heart disease of native coronary artery without angina pectoris: Secondary | ICD-10-CM

## 2019-08-17 DIAGNOSIS — R002 Palpitations: Secondary | ICD-10-CM

## 2019-08-17 DIAGNOSIS — E7800 Pure hypercholesterolemia, unspecified: Secondary | ICD-10-CM | POA: Diagnosis not present

## 2019-08-17 DIAGNOSIS — I10 Essential (primary) hypertension: Secondary | ICD-10-CM | POA: Diagnosis not present

## 2019-08-17 MED ORDER — ROSUVASTATIN CALCIUM 10 MG PO TABS: 10.0000 mg | ORAL_TABLET | Freq: Every day | ORAL | 3 refills | Status: DC

## 2019-08-17 NOTE — Patient Instructions (Signed)
Medication Instructions:  START ROSUVASTATIN 10 MG ONCE DAILY  *If you need a refill on your cardiac medications before your next appointment, please call your pharmacy*  Lab Work: Your physician recommends that you return for lab work in: [place]  If you have labs (blood work) drawn today and your tests are completely normal, you will receive your results only by: Marland Kitchen MyChart Message (if you have MyChart) OR . A paper copy in the mail If you have any lab test that is abnormal or we need to change your treatment, we will call you to review the results.   Follow-Up: At [facility], you and your health needs are our priority.  As part of our continuing mission to provide you with exceptional heart care, we have created designated Provider Care Teams.  These Care Teams include your primary Cardiologist (physician) and Advanced Practice Providers (APPs -  Physician Assistants and Nurse Practitioners) who all work together to provide you with the care you need, when you need it.  Your next appointment:   3 month(s)  The format for your next appointment:   In Person  Provider:   You may see Kirk Ruths MD or one of the following Advanced Practice Providers on your designated Care Team:    Kerin Ransom, PA-C  [place], [place]  Coletta Memos, [place]

## 2019-08-19 ENCOUNTER — Encounter: Payer: Self-pay | Admitting: Family Medicine

## 2019-08-19 ENCOUNTER — Ambulatory Visit (INDEPENDENT_AMBULATORY_CARE_PROVIDER_SITE_OTHER): Payer: Medicare Other | Admitting: Family Medicine

## 2019-08-19 ENCOUNTER — Other Ambulatory Visit: Payer: Self-pay

## 2019-08-19 VITALS — BP 135/51 | HR 66 | Ht 70.0 in | Wt 211.0 lb

## 2019-08-19 DIAGNOSIS — R002 Palpitations: Secondary | ICD-10-CM | POA: Diagnosis not present

## 2019-08-19 DIAGNOSIS — I2510 Atherosclerotic heart disease of native coronary artery without angina pectoris: Secondary | ICD-10-CM

## 2019-08-19 DIAGNOSIS — R29898 Other symptoms and signs involving the musculoskeletal system: Secondary | ICD-10-CM | POA: Diagnosis not present

## 2019-08-19 DIAGNOSIS — I10 Essential (primary) hypertension: Secondary | ICD-10-CM

## 2019-08-19 DIAGNOSIS — E1151 Type 2 diabetes mellitus with diabetic peripheral angiopathy without gangrene: Secondary | ICD-10-CM

## 2019-08-19 LAB — POCT GLYCOSYLATED HEMOGLOBIN (HGB A1C): Hemoglobin A1C: 6.2 % — AB (ref 4.0–5.6)

## 2019-08-19 NOTE — Progress Notes (Signed)
Established Patient Office Visit  Subjective:  Patient ID: Clayton Harrison, male    DOB: Jul 26, 1930  Age: 86 y.o. MRN: [id number]  CC:  Chief Complaint  Patient presents with  . Diabetes    HPI Clayton Harrison presents for   Diabetes - no hypoglycemic events. No wounds or sores that are not healing well. No increased thirst or urination. Checking glucose at home. Taking medications as prescribed without any side effects.  Has tried to cut back on his ice cream.   Hypertension- Pt denies chest pain, SOB, dizziness, or heart palpitations.  Taking meds as directed w/o problems.  Denies medication side effects.    Cardiology saw him and changed his lipitor  to Crestor and decreased his ASA dose.  He was having significant palpitations while on Lipitor and after stopping it they significantly improved.  He did wear a cardiac monitor for 30 days and just returned it on Sunday so has not heard back on those results yet.   Past Medical History:  Diagnosis Date  . Adenomatous colon polyp    2.5cm excised by right colectomy 2011  . Anemia, blood loss    history of  . Blockage of coronary artery of heart ([place]) 2001   x 4  . CAD (coronary artery disease)   . Carotid artery occlusion   . Cataract   . Cerebrovascular disease, unspecified   . Colon tumor 03/2010   right ascending  . Diabetic retinopathy ([place]) 09/29/2017  . DM (diabetes mellitus) ([place])   . Elevated PSA   . GERD (gastroesophageal reflux disease)   . Hearing loss   . HTN (hypertension)   . Hypercholesteremia   . Interstitial cystitis   . Macular degeneration   . Other and unspecified hyperlipidemia   . Prostate cancer ([place]) 03/09/14   Gleason 4+5=9, volume 98 mL  . PVD (peripheral vascular disease) ([place])   . S/P radiation therapy 07/20/2014 through 09/13/2014    Prostate 7800 cGy in 40 sessions, seminal vesicles 5600 cGy in 40 sessions   . SBO  (small bowel obstruction) ([place]) 07/2013  . Skin cancer 2012   melanoma upper back, removed  . Statin intolerance   . Vertigo   . Vertigo     Past Surgical History:  Procedure Laterality Date  . APPENDECTOMY  05/2010   with right hemicolectomy  . CARDIAC CATHETERIZATION  11/1999  . CAROTID ENDARTERECTOMY  2012   right  . CORONARY ARTERY BYPASS GRAFT  2001   4  . dental extractions Bilateral 1980  . DENTAL SURGERY    . [place]  . left carpal tunnel release  2009  . PARTIAL COLECTOMY  05/24/2010   Lap converted to open right proximal colectomy  . PROSTATE BIOPSY  03/09/14   Gleason 9, vol 98 mL  . SKIN CANCER EXCISION  02/2011   upper back  . TONSILLECTOMY  1940  . URETEROLITHOTOMY      Family History  Problem Relation Age of Onset  . Diabetes Mother   . Heart disease Mother   . Heart failure Mother   . Hypertension Mother   . Heart disease Father   . Cancer Cousin        colon  . Colon cancer Maternal Uncle        x4  . Cancer Maternal Uncle        colon  . Cancer Daughter        breast  . Cancer  Daughter        breast    Social History   Socioeconomic History  . Marital status: Married    Spouse name: Clayton Harrison  . Number of children: 2  . Years of education: HS  . Highest education level: Not on file  Occupational History  . Occupation: Retired  Tobacco Use  . Smoking status: Former Smoker    Packs/day: 2.00    Years: 40.00    Pack years: 80.00    Quit date: 11/23/1999    Years since quitting: 19.7  . Smokeless tobacco: Current User    Types: Chew  Substance and Sexual Activity  . Alcohol use: No  . Drug use: No  . Sexual activity: Yes    Partners: Female  Other Topics Concern  . Not on file  Social History Narrative   No regular exercise. Daily caffeine- 3-4 cups daily         Social Determinants of Health   Financial Resource Strain:   . Difficulty of Paying Living Expenses: Not on file  Food Insecurity:   . Worried About  Charity fundraiser in the Last Year: Not on file  . Ran Out of Food in the Last Year: Not on file  Transportation Needs:   . Lack of Transportation (Medical): Not on file  . Lack of Transportation (Non-Medical): Not on file  Physical Activity:   . Days of Exercise per Week: Not on file  . Minutes of Exercise per Session: Not on file  Stress:   . Feeling of Stress : Not on file  Social Connections:   . Frequency of Communication with Friends and Family: Not on file  . Frequency of Social Gatherings with Friends and Family: Not on file  . Attends Religious Services: Not on file  . Active Member of Clubs or Organizations: Not on file  . Attends Archivist Meetings: Not on file  . Marital Status: Not on file  Intimate Partner Violence:   . Fear of Current or Ex-Partner: Not on file  . Emotionally Abused: Not on file  . Physically Abused: Not on file  . Sexually Abused: Not on file    Outpatient Medications Prior to Visit  Medication Sig Dispense Refill  . acetaminophen (TYLENOL) 500 MG tablet Take 1,000 mg by mouth every 6 (six) hours as needed for mild pain.    . Albuterol Sulfate (PROAIR RESPICLICK) 123XX123 (90 Base) MCG/ACT AEPB Inhale 2 puffs into the lungs every 6 (six) hours as needed. 3 each 1  . amLODipine (NORVASC) 10 MG tablet Take 1 tablet (10 mg total) by mouth daily. 90 tablet 2  . aspirin EC 81 MG tablet Take 1 tablet (81 mg total) by mouth daily. 90 tablet 3  . DULoxetine (CYMBALTA) 60 MG capsule TAKE 1 CAPSULE BY MOUTH  DAILY 90 capsule 3  . glipiZIDE (GLUCOTROL) 10 MG tablet TAKE 1 TABLET BY MOUTH  TWICE A DAY BEFORE MEALS 180 tablet 3  . Insulin Glargine, 1 Unit Dial, (TOUJEO SOLOSTAR) 300 UNIT/ML SOPN Inject 38 Units into the skin at bedtime. (Patient taking differently: Inject 34 Units into the skin at bedtime. ) 4.5 mL 6  . Insulin Pen Needle (BD PEN NEEDLE NANO U/F) 32G X 4 MM MISC INJECT SUBCUTANEOUSLY DAILY 90 each 3  . lisinopril (ZESTRIL) 20 MG tablet  TAKE 1 TABLET BY MOUTH AT  BEDTIME 90 tablet 3  . meclizine (ANTIVERT) 25 MG tablet TAKE ONE-HALF TABLET BY  MOUTH TWO  TIMES DAILY AS  NEEDED FOR DIZZINESS 90 tablet 1  . metFORMIN (GLUCOPHAGE) 1000 MG tablet TAKE 1 TABLET BY MOUTH  TWICE DAILY WITH MEALS 180 tablet 3  . metoprolol tartrate (LOPRESSOR) 50 MG tablet Take 1 tablet (50 mg total) by mouth 2 (two) times daily. 180 tablet 3  . Multiple Vitamins-Minerals (PRESERVISION AREDS 2+MULTI VIT) CAPS Take 1 capsule by mouth 2 (two) times daily at 10 AM and 5 PM.    . rosuvastatin (CRESTOR) 10 MG tablet Take 1 tablet (10 mg total) by mouth daily. 90 tablet 3  . tamsulosin (FLOMAX) 0.4 MG CAPS capsule Take 0.4 mg by mouth daily.    . traMADol (ULTRAM) 50 MG tablet TAKE 1 TABLET BY MOUTH  DAILY AS NEEDED 90 tablet 3  . umeclidinium bromide (INCRUSE ELLIPTA) 62.5 MCG/INH AEPB Inhale 1 puff into the lungs daily. 90 each 3  . valACYclovir (VALTREX) 1000 MG tablet TAKE HALF TABLET BY MOUTH 2 TIMES DAILY. X 5 DAYS AS  NEEDED FOR OUTBREAK. 90 tablet 1   No facility-administered medications prior to visit.    Allergies  Allergen Reactions  . Shellfish Allergy Anaphylaxis    Swelling of the throat  . Statins Other (See Comments)    Myalgias, urinary frequency  . Atorvastatin Other (See Comments)    urinary frequency, palpitations  . Ezetimibe Other (See Comments)    "hip problems"  . Flomax [Tamsulosin Hcl] Other (See Comments)    Complains of dizziness  . Prednisone Other (See Comments)    Crazy dreams, insomnia  . Tizanidine Other (See Comments)    Insomnia, nightmares    ROS Review of Systems    Objective:    Physical Exam  Constitutional: He is oriented to person, place, and time. He appears well-developed and well-nourished.  HENT:  Head: Normocephalic and atraumatic.  Cardiovascular: Normal rate, regular rhythm and normal heart sounds.  Pulmonary/Chest: Effort normal and breath sounds normal.  Neurological: He is alert and  oriented to person, place, and time.  Skin: Skin is warm and dry.  Psychiatric: He has a normal mood and affect. His behavior is normal.    BP (!) 135/51   Pulse 66   Ht 5\' 10"  (1.778 m)   Wt 211 lb (95.7 kg)   SpO2 97%   BMI 30.28 kg/m  Wt Readings from Last 3 Encounters:  08/19/19 211 lb (95.7 kg)  08/17/19 212 lb (96.2 kg)  07/06/19 205 lb (93 kg)     Health Maintenance Due  Topic Date Due  . FOOT EXAM  05/01/2019    There are no preventive care reminders to display for this patient.  Lab Results  Component Value Date   TSH 2.02 01/05/2019   Lab Results  Component Value Date   WBC 6.9 06/23/2019   HGB 13.2 06/23/2019   HCT 41.8 06/23/2019   MCV 93.7 06/23/2019   PLT 224 06/23/2019   Lab Results  Component Value Date   NA 139 06/23/2019   K 4.4 06/23/2019   CO2 23 06/23/2019   GLUCOSE 146 (H) 06/23/2019   BUN 20 06/23/2019   CREATININE 1.38 (H) 06/23/2019   BILITOT 0.5 04/20/2019   ALKPHOS 50 07/20/2015   AST 19 04/20/2019   ALT 16 04/20/2019   PROT 6.8 04/20/2019   ALBUMIN 4.0 07/20/2015   CALCIUM 8.7 (L) 06/23/2019   ANIONGAP 11 06/23/2019   GFR 59.22 (L) 03/06/2012   Lab Results  Component Value Date   CHOL 179 04/08/2019  Lab Results  Component Value Date   HDL 43 04/08/2019   Lab Results  Component Value Date   LDLCALC 99 04/08/2019   Lab Results  Component Value Date   TRIG 244 (H) 04/08/2019   Lab Results  Component Value Date   CHOLHDL 4.2 04/08/2019   Lab Results  Component Value Date   HGBA1C 6.2 (A) 08/19/2019      Assessment & Plan:   Problem List Items Addressed This Visit      Cardiovascular and Mediastinum   Essential hypertension    Well controlled. Continue current regimen. Follow up in  4 mo      DM (diabetes mellitus) with peripheral vascular complication ([place]) - Primary    Well controlled. Continue current regimen. Follow up in  4 mo      Relevant Orders   POCT glycosylated hemoglobin (Hb A1C)  (Completed)   Coronary atherosclerosis    Recently started Crestor and doing well so far.        Nervous and Auditory   Weakness of both lower extremities    We had actually discussed this at a previous visit and recommend again working on a regular exercise routine.  In fact he bought stationary pedals to be used for exercise and we discussed a strategy around getting started using that routinely during a favorite TV show that he might watch daily and to use it at least during the commercial breaks and then rest in between and try to build up to a total of 30 minutes daily.  His wife says she is interested in maybe doing it as well.  Part of his weakness is felt to be secondary to his mostly sedentary lifestyle.        Other   Palpitations    Proved since stopping Lipitor.  Awaiting heart monitor results.         No orders of the defined types were placed in this encounter.   Follow-up: Return in about 4 months (around 12/17/2019) for Hypertension, Diabetes follow-up.    [name], MD

## 2019-08-19 NOTE — Assessment & Plan Note (Signed)
Well controlled. Continue current regimen. Follow up in  4 mo 

## 2019-08-19 NOTE — Assessment & Plan Note (Signed)
Proved since stopping Lipitor.  Awaiting heart monitor results.

## 2019-08-19 NOTE — Assessment & Plan Note (Signed)
Recently started Crestor and doing well so far.

## 2019-08-19 NOTE — Assessment & Plan Note (Signed)
We had actually discussed this at a previous visit and recommend again working on a regular exercise routine.  In fact he bought stationary pedals to be used for exercise and we discussed a strategy around getting started using that routinely during a favorite TV show that he might watch daily and to use it at least during the commercial breaks and then rest in between and try to build up to a total of 30 minutes daily.  His wife says she is interested in maybe doing it as well.  Part of his weakness is felt to be secondary to his mostly sedentary lifestyle.

## 2019-08-26 ENCOUNTER — Encounter: Payer: Self-pay | Admitting: Family Medicine

## 2019-08-27 ENCOUNTER — Other Ambulatory Visit: Payer: Self-pay | Admitting: *Deleted

## 2019-08-27 ENCOUNTER — Encounter: Payer: Self-pay | Admitting: *Deleted

## 2019-08-27 DIAGNOSIS — R29898 Other symptoms and signs involving the musculoskeletal system: Secondary | ICD-10-CM

## 2019-08-27 DIAGNOSIS — M79605 Pain in left leg: Secondary | ICD-10-CM

## 2019-08-27 DIAGNOSIS — M79604 Pain in right leg: Secondary | ICD-10-CM

## 2019-08-27 NOTE — Telephone Encounter (Signed)
Chart updated

## 2019-08-27 NOTE — Progress Notes (Signed)
vas 

## 2019-09-08 DIAGNOSIS — L821 Other seborrheic keratosis: Secondary | ICD-10-CM | POA: Diagnosis not present

## 2019-09-08 DIAGNOSIS — D1801 Hemangioma of skin and subcutaneous tissue: Secondary | ICD-10-CM | POA: Diagnosis not present

## 2019-09-08 DIAGNOSIS — L570 Actinic keratosis: Secondary | ICD-10-CM | POA: Diagnosis not present

## 2019-09-08 DIAGNOSIS — L720 Epidermal cyst: Secondary | ICD-10-CM | POA: Diagnosis not present

## 2019-09-08 DIAGNOSIS — L814 Other melanin hyperpigmentation: Secondary | ICD-10-CM | POA: Diagnosis not present

## 2019-09-08 DIAGNOSIS — D229 Melanocytic nevi, unspecified: Secondary | ICD-10-CM | POA: Diagnosis not present

## 2019-09-08 DIAGNOSIS — L819 Disorder of pigmentation, unspecified: Secondary | ICD-10-CM | POA: Diagnosis not present

## 2019-09-09 ENCOUNTER — Ambulatory Visit (HOSPITAL_COMMUNITY)
Admission: RE | Admit: 2019-09-09 | Payer: Medicare Other | Source: Ambulatory Visit | Attending: Cardiology | Admitting: Cardiology

## 2019-09-17 ENCOUNTER — Ambulatory Visit (HOSPITAL_COMMUNITY)
Admission: RE | Admit: 2019-09-17 | Discharge: 2019-09-17 | Disposition: A | Discharge status: Home / Self Care | Payer: Medicare Other | Source: Ambulatory Visit | Attending: Internal Medicine | Admitting: Internal Medicine

## 2019-09-17 ENCOUNTER — Other Ambulatory Visit: Payer: Self-pay

## 2019-09-17 DIAGNOSIS — M79605 Pain in left leg: Secondary | ICD-10-CM | POA: Insufficient documentation

## 2019-09-17 DIAGNOSIS — R29898 Other symptoms and signs involving the musculoskeletal system: Secondary | ICD-10-CM | POA: Diagnosis not present

## 2019-09-17 DIAGNOSIS — M79604 Pain in right leg: Secondary | ICD-10-CM | POA: Diagnosis not present

## 2019-09-20 ENCOUNTER — Encounter: Payer: Self-pay | Admitting: *Deleted

## 2019-09-28 ENCOUNTER — Other Ambulatory Visit: Payer: Self-pay

## 2019-09-28 ENCOUNTER — Encounter: Payer: Self-pay | Admitting: Cardiovascular Disease

## 2019-09-28 ENCOUNTER — Ambulatory Visit: Payer: Medicare Other | Admitting: Cardiovascular Disease

## 2019-09-28 VITALS — BP 152/60 | HR 73 | Ht 69.0 in | Wt 210.0 lb

## 2019-09-28 DIAGNOSIS — I2510 Atherosclerotic heart disease of native coronary artery without angina pectoris: Secondary | ICD-10-CM

## 2019-09-28 DIAGNOSIS — E785 Hyperlipidemia, unspecified: Secondary | ICD-10-CM

## 2019-09-28 DIAGNOSIS — E7800 Pure hypercholesterolemia, unspecified: Secondary | ICD-10-CM | POA: Diagnosis not present

## 2019-09-28 DIAGNOSIS — Z01818 Encounter for other preprocedural examination: Secondary | ICD-10-CM | POA: Diagnosis not present

## 2019-09-28 DIAGNOSIS — I10 Essential (primary) hypertension: Secondary | ICD-10-CM

## 2019-09-28 DIAGNOSIS — I739 Peripheral vascular disease, unspecified: Secondary | ICD-10-CM | POA: Diagnosis not present

## 2019-09-28 LAB — CBC
Hematocrit: 36.1 % — ABNORMAL LOW (ref 37.5–51.0)
Hemoglobin: 12.3 g/dL — ABNORMAL LOW (ref 13.0–17.7)
MCH: 29.4 pg (ref 26.6–33.0)
MCHC: 34.1 g/dL (ref 31.5–35.7)
MCV: 86 fL (ref 79–97)
Platelets: 227 10*3/uL (ref 150–450)
RBC: 4.18 x10E6/uL (ref 4.14–5.80)
RDW: 12.9 % (ref 11.6–15.4)
WBC: 6.8 10*3/uL (ref 3.4–10.8)

## 2019-09-28 LAB — HEPATIC FUNCTION PANEL
ALT: 15 IU/L (ref 0–44)
AST: 18 IU/L (ref 0–40)
Albumin: 4.5 g/dL (ref 3.6–4.6)
Alkaline Phosphatase: 69 IU/L (ref 39–117)
Bilirubin Total: 0.4 mg/dL (ref 0.0–1.2)
Bilirubin, Direct: 0.15 mg/dL (ref 0.00–0.40)
Total Protein: 7.1 g/dL (ref 6.0–8.5)

## 2019-09-28 LAB — LIPID PANEL
Chol/HDL Ratio: 2.4 ratio (ref 0.0–5.0)
Cholesterol, Total: 122 mg/dL (ref 100–199)
HDL: 50 mg/dL (ref >39)
LDL Chol Calc (NIH): 52 mg/dL (ref 0–99)
Triglycerides: 111 mg/dL (ref 0–149)
VLDL Cholesterol Cal: 20 mg/dL (ref 5–40)

## 2019-09-28 LAB — BASIC METABOLIC PANEL
BUN/Creatinine Ratio: 15 (ref 10–24)
BUN: 17 mg/dL (ref 8–27)
CO2: 22 mmol/L (ref 20–29)
Calcium: 9.6 mg/dL (ref 8.6–10.2)
Chloride: 101 mmol/L (ref 96–106)
Creatinine, Ser: 1.12 mg/dL (ref 0.76–1.27)
GFR calc Af Amer: 68 mL/min/{1.73_m2} (ref >59)
GFR calc non Af Amer: 59 mL/min/{1.73_m2} — ABNORMAL LOW (ref >59)
Glucose: 149 mg/dL — ABNORMAL HIGH (ref 65–99)
Potassium: 5.5 mmol/L — ABNORMAL HIGH (ref 3.5–5.2)
Sodium: 139 mmol/L (ref 134–144)

## 2019-09-28 NOTE — Progress Notes (Signed)
Cardiology Office Note   Date:  09/29/2019   ID:  Clayton Harrison, DOB Jul 18, 1930, MRN [id number]  PCP:  Clayton Marry, MD  Cardiologist: Dr. Stanford Harrison  No chief complaint on file.     History of Present Illness: Clayton Harrison is a 86 y.o. male who was referred by Dr. Stanford Harrison for evaluation management of peripheral arterial disease.  He has known history of coronary artery disease status post CABG in 2001,  carotid disease status post right carotid endarterectomy, essential hypertension, hyperlipidemia, CKD, GERD, diabetes mellitus and peripheral arterial disease. The patient reports prolonged symptoms of exertional leg pain for many years.  However, recently he started having severe pain in both feet and calfs at night at rest that wakes him up.  Symptoms are worse on the left side.  He has no ulceration.  His mobility is limited and he walks with a cane.  He underwent recent noninvasive vascular studies which showed an ABI of 0.72 on the right and 0.53 on the left.  Duplex showed moderate right common femoral artery disease and significant right SFA disease with two-vessel runoff below the knee.  On the left, there was significant common femoral artery disease and severe proximal SFA disease with two-vessel runoff below the knee.  Past Medical History:  Diagnosis Date  . Adenomatous colon polyp    2.5cm excised by right colectomy 2011  . Anemia, blood loss    history of  . Blockage of coronary artery of heart ([place]) 2001   x 4  . CAD (coronary artery disease)   . Carotid artery occlusion   . Cataract   . Cerebrovascular disease, unspecified   . Colon tumor 03/2010   right ascending  . Diabetic retinopathy ([place]) 09/29/2017  . DM (diabetes mellitus) ([place])   . Elevated PSA   . GERD (gastroesophageal reflux disease)   . Hearing loss   . HTN (hypertension)   . Hypercholesteremia   . Interstitial cystitis   . Macular degeneration   . Other and unspecified hyperlipidemia    . Prostate cancer ([place]) 03/09/14   Gleason 4+5=9, volume 98 mL  . PVD (peripheral vascular disease) ([place])   . S/P radiation therapy 07/20/2014 through 09/13/2014    Prostate 7800 cGy in 40 sessions, seminal vesicles 5600 cGy in 40 sessions   . SBO (small bowel obstruction) ([place]) 07/2013  . Skin cancer 2012   melanoma upper back, removed  . Statin intolerance   . Vertigo   . Vertigo     Past Surgical History:  Procedure Laterality Date  . APPENDECTOMY  05/2010   with right hemicolectomy  . CARDIAC CATHETERIZATION  11/1999  . CAROTID ENDARTERECTOMY  2012   right  . CORONARY ARTERY BYPASS GRAFT  2001   4  . dental extractions Bilateral 1980  . DENTAL SURGERY    . [place]  . left carpal tunnel release  2009  . PARTIAL COLECTOMY  05/24/2010   Lap converted to open right proximal colectomy  . PROSTATE BIOPSY  03/09/14   Gleason 9, vol 98 mL  . SKIN CANCER EXCISION  02/2011   upper back  . TONSILLECTOMY  1940  . URETEROLITHOTOMY       Current Outpatient Medications  Medication Sig Dispense Refill  . acetaminophen (TYLENOL) 500 MG tablet Take 1,000 mg by mouth every 6 (six) hours as needed for mild pain.    . Albuterol Sulfate (PROAIR RESPICLICK) 123XX123 (90 Base) MCG/ACT AEPB  Inhale 2 puffs into the lungs every 6 (six) hours as needed. 3 each 1  . amLODipine (NORVASC) 10 MG tablet Take 1 tablet (10 mg total) by mouth daily. 90 tablet 2  . aspirin EC 81 MG tablet Take 1 tablet (81 mg total) by mouth daily. 90 tablet 3  . DULoxetine (CYMBALTA) 60 MG capsule TAKE 1 CAPSULE BY MOUTH  DAILY 90 capsule 3  . glipiZIDE (GLUCOTROL) 10 MG tablet TAKE 1 TABLET BY MOUTH  TWICE A DAY BEFORE MEALS 180 tablet 3  . Insulin Glargine, 1 Unit Dial, (TOUJEO SOLOSTAR) 300 UNIT/ML SOPN Inject 38 Units into the skin at bedtime. (Patient taking differently: Inject 34 Units into the skin at bedtime. ) 4.5 mL 6  .  Insulin Pen Needle (BD PEN NEEDLE NANO U/F) 32G X 4 MM MISC INJECT SUBCUTANEOUSLY DAILY 90 each 3  . lisinopril (ZESTRIL) 20 MG tablet TAKE 1 TABLET BY MOUTH AT  BEDTIME 90 tablet 3  . meclizine (ANTIVERT) 25 MG tablet TAKE ONE-HALF TABLET BY  MOUTH TWO TIMES DAILY AS  NEEDED FOR DIZZINESS 90 tablet 1  . metFORMIN (GLUCOPHAGE) 1000 MG tablet TAKE 1 TABLET BY MOUTH  TWICE DAILY WITH MEALS 180 tablet 3  . metoprolol tartrate (LOPRESSOR) 50 MG tablet Take 1 tablet (50 mg total) by mouth 2 (two) times daily. 180 tablet 3  . Multiple Vitamins-Minerals (PRESERVISION AREDS 2+MULTI VIT) CAPS Take 1 capsule by mouth 2 (two) times daily at 10 AM and 5 PM.    . rosuvastatin (CRESTOR) 10 MG tablet Take 1 tablet (10 mg total) by mouth daily. 90 tablet 3  . tamsulosin (FLOMAX) 0.4 MG CAPS capsule Take 0.4 mg by mouth daily.    . traMADol (ULTRAM) 50 MG tablet TAKE 1 TABLET BY MOUTH  DAILY AS NEEDED 90 tablet 3  . umeclidinium bromide (INCRUSE ELLIPTA) 62.5 MCG/INH AEPB Inhale 1 puff into the lungs daily. 90 each 3  . valACYclovir (VALTREX) 1000 MG tablet TAKE HALF TABLET BY MOUTH 2 TIMES DAILY. X 5 DAYS AS  NEEDED FOR OUTBREAK. 90 tablet 1   No current facility-administered medications for this visit.    Allergies:   Shellfish allergy, Statins, Atorvastatin, Ezetimibe, Flomax [tamsulosin hcl], Prednisone, and Tizanidine    Social History:  The patient  reports that he quit smoking about 19 years ago. He has a 80.00 pack-year smoking history. His smokeless tobacco use includes chew. He reports that he does not drink alcohol or use drugs.   Family History:  The patient's family history includes Cancer in his cousin, daughter, daughter, and maternal uncle; Colon cancer in his maternal uncle; Diabetes in his mother; Heart disease in his father and mother; Heart failure in his mother; Hypertension in his mother.    ROS:  Please see the history of present illness.   Otherwise, review of systems are positive for  none.   All other systems are reviewed and negative.    PHYSICAL EXAM: VS:  BP (!) 152/60   Pulse 73   Ht 5\' 9"  (1.753 m)   Wt 210 lb (95.3 kg)   SpO2 95%   BMI 31.01 kg/m  , BMI Body mass index is 31.01 kg/m. GEN: Well nourished, well developed, in no acute distress  HEENT: normal  Neck: no JVD, carotid bruits, or masses Cardiac: RRR; no murmurs, rubs, or gallops,no edema  Respiratory:  clear to auscultation bilaterally, normal work of breathing GI: soft, nontender, nondistended, + BS MS: no deformity or atrophy  Skin: warm and dry, no rash Neuro:  Strength and sensation are intact Psych: euthymic mood, full affect Vascular: Femoral pulses +2 on the right and +1 on the left.  Distal pulses are not palpable.   EKG:  EKG is not ordered today.    Recent Labs: 01/05/2019: Magnesium 1.7; TSH 2.02 09/28/2019: ALT 15; BUN 17; Creatinine, Ser 1.12; Hemoglobin 12.3; Platelets 227; Potassium 5.5; Sodium 139    Lipid Panel    Component Value Date/Time   CHOL 122 09/28/2019 1056   TRIG 111 09/28/2019 1056   TRIG 202 (HH) 06/05/2006 0828   HDL 50 09/28/2019 1056   CHOLHDL 2.4 09/28/2019 1056   CHOLHDL 4.2 04/08/2019 1551   VLDL 67 (H) 07/20/2015 1003   LDLCALC 52 09/28/2019 1056   LDLCALC 99 04/08/2019 1551   LDLDIRECT 104.2 03/06/2012 0845      Wt Readings from Last 3 Encounters:  09/28/19 210 lb (95.3 kg)  08/19/19 211 lb (95.7 kg)  08/17/19 212 lb (96.2 kg)        No flowsheet data found.    ASSESSMENT AND PLAN:  1.  Peripheral arterial disease with rest pain worse on the left side: The patient has severe common femoral and SFA disease on the left side and severe SFA disease on the right side.  He has rest pain with no lower extremity ulceration.  Given severity of his symptoms, I have recommended proceeding with abdominal aortogram, lower extremity runoff and possible endovascular intervention.  I discussed the procedure in details as well as risks and benefits.  He does have underlying chronic kidney disease and he is diabetic and thus he is at high risk for contrast-induced nephropathy.  This was discussed.  We will plan  4 hours hydration before the procedure and also using CO2. Planned access is via the right common femoral artery.  2.  Coronary artery disease involving native coronary arteries without angina: Continue medical therapy.  3.  Essential hypertension: Blood pressure is mildly elevated today.  Continue to monitor for now.  4.  Hyperlipidemia: Continue treatment with rosuvastatin.  He is scheduled for lipid and liver profile today.  5.  Carotid disease status post right carotid enterectomy.  Most recent Doppler showed mild nonobstructive disease.    Disposition:   FU with me in 1 month  Signed,  Kathlyn Sacramento, MD  09/29/2019 9:58 AM    [place]

## 2019-09-28 NOTE — Patient Instructions (Signed)
Medication Instructions:  No changes *If you need a refill on your cardiac medications before your next appointment, please call your pharmacy*   Lab Work: None ordered If you have labs (blood work) drawn today and your tests are completely normal, you will receive your results only by: Marland Kitchen MyChart Message (if you have MyChart) OR . A paper copy in the mail If you have any lab test that is abnormal or we need to change your treatment, we will call you to review the results.   Testing/Procedures: Your physician has requested that you have a peripheral vascular angiogram. This exam is performed at the hospital. During this exam IV contrast is used to look at arterial blood flow. Please review the information sheet given for details.  Follow-Up: At [facility], you and your health needs are our priority.  As part of our continuing mission to provide you with exceptional heart care, we have created designated Provider Care Teams.  These Care Teams include your primary Cardiologist (physician) and Advanced Practice Providers (APPs -  Physician Assistants and Nurse Practitioners) who all work together to provide you with the care you need, when you need it.  We recommend signing up for the patient portal called "MyChart".  Sign up information is provided on this After Visit Summary.  MyChart is used to connect with patients for Virtual Visits (Telemedicine).  Patients are able to view lab/test results, encounter notes, upcoming appointments, etc.  Non-urgent messages can be sent to your provider as well.   To learn more about what you can do with MyChart, go to [email or web address].    Your next appointment:   Keep your post procedure follow up with Dr. Fletcher Anon on May 11th at 9:40 am   Other [facility] [place] [address] Dept: [phone] Loc: [place]  09/28/2019  You are scheduled for a Peripheral Angiogram on Wednesday, April 14 with Dr. Kathlyn Sacramento.  1. Please arrive at the [facility] (Main Entrance A) at [facility]: [address] [place], [place] [address] at 6:00 AM (This time is 4 hours before your procedure to ensure your preparation). Free valet parking service is available.   Special note: Every effort is made to have your procedure done on time. Please understand that emergencies sometimes delay scheduled procedures.  2. Diet: Do not eat solid foods after midnight.  The patient may have clear liquids until 5am upon the day of the procedure.  3. Labs:  You will need to have the coronavirus test completed prior to your procedure. An appointment has been made at 10:35 am on 10/02/19. This is a Drive Up Visit at the [employer] [address]. Someone will direct you to the appropriate testing line. Please tell them that you are there for procedure testing. Stay in your car and someone will be with you shortly. Please make sure to have all other labs completed before this test because you will need to stay quarantined until your procedure.   4. Medication instructions in preparation for your procedure:  Hold all diabetic medication the morning of the procedure Hold Metformin the morning of and then 48 hours after.  Take half the dose of the Insulin Glargine the night before the procedure.   On the morning of your procedure, take your Aspirin and any morning medicines NOT listed above.  You may use  sips of water.  5. Plan for one night stay--bring personal belongings. 6. Bring a current list of your medications and current insurance cards. 7. You MUST have a responsible person to drive you home. 8. Someone MUST be with you the first 24 hours after you arrive home or your discharge will be delayed. 9. Please wear clothes that are easy to get on and off and wear slip-on shoes.  Thank you for allowing  [place] to care for you!   -- [facility]

## 2019-09-28 NOTE — H&P (View-Only) (Signed)
Cardiology Office Note   Date:  09/29/2019   ID:  Clayton Harrison, DOB [birth year], MRN [id number]  PCP:  Hali Marry, MD  Cardiologist: Dr. Stanford Breed  No chief complaint on file.     History of Present Illness: Clayton Harrison is a 86 y.o. male who was referred by Dr. Stanford Breed for evaluation management of peripheral arterial disease.  He has known history of coronary artery disease status post CABG in 2001,  carotid disease status post right carotid endarterectomy, essential hypertension, hyperlipidemia, CKD, GERD, diabetes mellitus and peripheral arterial disease. The patient reports prolonged symptoms of exertional leg pain for many years.  However, recently he started having severe pain in both feet and calfs at night at rest that wakes him up.  Symptoms are worse on the left side.  He has no ulceration.  His mobility is limited and he walks with a cane.  He underwent recent noninvasive vascular studies which showed an ABI of 0.72 on the right and 0.53 on the left.  Duplex showed moderate right common femoral artery disease and significant right SFA disease with two-vessel runoff below the knee.  On the left, there was significant common femoral artery disease and severe proximal SFA disease with two-vessel runoff below the knee.  Past Medical History:  Diagnosis Date  . Adenomatous colon polyp    2.5cm excised by right colectomy 2011  . Anemia, blood loss    history of  . Blockage of coronary artery of heart ([place]) 2001   x 4  . CAD (coronary artery disease)   . Carotid artery occlusion   . Cataract   . Cerebrovascular disease, unspecified   . Colon tumor 03/2010   right ascending  . Diabetic retinopathy ([place]) 09/29/2017  . DM (diabetes mellitus) ([place])   . Elevated PSA   . GERD (gastroesophageal reflux disease)   . Hearing loss   . HTN (hypertension)   . Hypercholesteremia   . Interstitial cystitis   . Macular degeneration   . Other and unspecified hyperlipidemia    . Prostate cancer ([place]) 03/09/14   Gleason 4+5=9, volume 98 mL  . PVD (peripheral vascular disease) ([place])   . S/P radiation therapy 07/20/2014 through 09/13/2014    Prostate 7800 cGy in 40 sessions, seminal vesicles 5600 cGy in 40 sessions   . SBO (small bowel obstruction) ([place]) 07/2013  . Skin cancer 2012   melanoma upper back, removed  . Statin intolerance   . Vertigo   . Vertigo     Past Surgical History:  Procedure Laterality Date  . APPENDECTOMY  05/2010   with right hemicolectomy  . CARDIAC CATHETERIZATION  11/1999  . CAROTID ENDARTERECTOMY  2012   right  . CORONARY ARTERY BYPASS GRAFT  2001   4  . dental extractions Bilateral 1980  . DENTAL SURGERY    . [place]  . left carpal tunnel release  2009  . PARTIAL COLECTOMY  05/24/2010   Lap converted to open right proximal colectomy  . PROSTATE BIOPSY  03/09/14   Gleason 9, vol 98 mL  . SKIN CANCER EXCISION  02/2011   upper back  . TONSILLECTOMY  1940  . URETEROLITHOTOMY       Current Outpatient Medications  Medication Sig Dispense Refill  . acetaminophen (TYLENOL) 500 MG tablet Take 1,000 mg by mouth every 6 (six) hours as needed for mild pain.    . Albuterol Sulfate (PROAIR RESPICLICK) 123XX123 (90 Base) MCG/ACT AEPB  Inhale 2 puffs into the lungs every 6 (six) hours as needed. 3 each 1  . amLODipine (NORVASC) 10 MG tablet Take 1 tablet (10 mg total) by mouth daily. 90 tablet 2  . aspirin EC 81 MG tablet Take 1 tablet (81 mg total) by mouth daily. 90 tablet 3  . DULoxetine (CYMBALTA) 60 MG capsule TAKE 1 CAPSULE BY MOUTH  DAILY 90 capsule 3  . glipiZIDE (GLUCOTROL) 10 MG tablet TAKE 1 TABLET BY MOUTH  TWICE A DAY BEFORE MEALS 180 tablet 3  . Insulin Glargine, 1 Unit Dial, (TOUJEO SOLOSTAR) 300 UNIT/ML SOPN Inject 38 Units into the skin at bedtime. (Patient taking differently: Inject 34 Units into the skin at bedtime. ) 4.5 mL 6  .  Insulin Pen Needle (BD PEN NEEDLE NANO U/F) 32G X 4 MM MISC INJECT SUBCUTANEOUSLY DAILY 90 each 3  . lisinopril (ZESTRIL) 20 MG tablet TAKE 1 TABLET BY MOUTH AT  BEDTIME 90 tablet 3  . meclizine (ANTIVERT) 25 MG tablet TAKE ONE-HALF TABLET BY  MOUTH TWO TIMES DAILY AS  NEEDED FOR DIZZINESS 90 tablet 1  . metFORMIN (GLUCOPHAGE) 1000 MG tablet TAKE 1 TABLET BY MOUTH  TWICE DAILY WITH MEALS 180 tablet 3  . metoprolol tartrate (LOPRESSOR) 50 MG tablet Take 1 tablet (50 mg total) by mouth 2 (two) times daily. 180 tablet 3  . Multiple Vitamins-Minerals (PRESERVISION AREDS 2+MULTI VIT) CAPS Take 1 capsule by mouth 2 (two) times daily at 10 AM and 5 PM.    . rosuvastatin (CRESTOR) 10 MG tablet Take 1 tablet (10 mg total) by mouth daily. 90 tablet 3  . tamsulosin (FLOMAX) 0.4 MG CAPS capsule Take 0.4 mg by mouth daily.    . traMADol (ULTRAM) 50 MG tablet TAKE 1 TABLET BY MOUTH  DAILY AS NEEDED 90 tablet 3  . umeclidinium bromide (INCRUSE ELLIPTA) 62.5 MCG/INH AEPB Inhale 1 puff into the lungs daily. 90 each 3  . valACYclovir (VALTREX) 1000 MG tablet TAKE HALF TABLET BY MOUTH 2 TIMES DAILY. X 5 DAYS AS  NEEDED FOR OUTBREAK. 90 tablet 1   No current facility-administered medications for this visit.    Allergies:   Shellfish allergy, Statins, Atorvastatin, Ezetimibe, Flomax [tamsulosin hcl], Prednisone, and Tizanidine    Social History:  The patient  reports that he quit smoking about 19 years ago. He has a 80.00 pack-year smoking history. His smokeless tobacco use includes chew. He reports that he does not drink alcohol or use drugs.   Family History:  The patient's family history includes Cancer in his cousin, daughter, daughter, and maternal uncle; Colon cancer in his maternal uncle; Diabetes in his mother; Heart disease in his father and mother; Heart failure in his mother; Hypertension in his mother.    ROS:  Please see the history of present illness.   Otherwise, review of systems are positive for  none.   All other systems are reviewed and negative.    PHYSICAL EXAM: VS:  BP (!) 152/60   Pulse 73   Ht 5\' 9"  (1.753 m)   Wt 210 lb (95.3 kg)   SpO2 95%   BMI 31.01 kg/m  , BMI Body mass index is 31.01 kg/m. GEN: Well nourished, well developed, in no acute distress  HEENT: normal  Neck: no JVD, carotid bruits, or masses Cardiac: RRR; no murmurs, rubs, or gallops,no edema  Respiratory:  clear to auscultation bilaterally, normal work of breathing GI: soft, nontender, nondistended, + BS MS: no deformity or atrophy  Skin: warm and dry, no rash Neuro:  Strength and sensation are intact Psych: euthymic mood, full affect Vascular: Femoral pulses +2 on the right and +1 on the left.  Distal pulses are not palpable.   EKG:  EKG is not ordered today.    Recent Labs: 01/05/2019: Magnesium 1.7; TSH 2.02 09/28/2019: ALT 15; BUN 17; Creatinine, Ser 1.12; Hemoglobin 12.3; Platelets 227; Potassium 5.5; Sodium 139    Lipid Panel    Component Value Date/Time   CHOL 122 09/28/2019 1056   TRIG 111 09/28/2019 1056   TRIG 202 (HH) 06/05/2006 0828   HDL 50 09/28/2019 1056   CHOLHDL 2.4 09/28/2019 1056   CHOLHDL 4.2 04/08/2019 1551   VLDL 67 (H) 07/20/2015 1003   LDLCALC 52 09/28/2019 1056   LDLCALC 99 04/08/2019 1551   LDLDIRECT 104.2 03/06/2012 0845      Wt Readings from Last 3 Encounters:  09/28/19 210 lb (95.3 kg)  08/19/19 211 lb (95.7 kg)  08/17/19 212 lb (96.2 kg)        No flowsheet data found.    ASSESSMENT AND PLAN:  1.  Peripheral arterial disease with rest pain worse on the left side: The patient has severe common femoral and SFA disease on the left side and severe SFA disease on the right side.  He has rest pain with no lower extremity ulceration.  Given severity of his symptoms, I have recommended proceeding with abdominal aortogram, lower extremity runoff and possible endovascular intervention.  I discussed the procedure in details as well as risks and  benefits. He does have underlying chronic kidney disease and he is diabetic and thus he is at high risk for contrast-induced nephropathy.  This was discussed.  We will plan  4 hours hydration before the procedure and also using CO2. Planned access is via the right common femoral artery.  2.  Coronary artery disease involving native coronary arteries without angina: Continue medical therapy.  3.  Essential hypertension: Blood pressure is mildly elevated today.  Continue to monitor for now.  4.  Hyperlipidemia: Continue treatment with rosuvastatin.  He is scheduled for lipid and liver profile today.  5.  Carotid disease status post right carotid enterectomy.  Most recent Doppler showed mild nonobstructive disease.    Disposition:   FU with me in 1 month  Signed,  Kathlyn Sacramento, MD  09/29/2019 9:58 AM    [place]

## 2019-09-30 ENCOUNTER — Other Ambulatory Visit: Payer: Self-pay | Admitting: Family Medicine

## 2019-10-02 ENCOUNTER — Other Ambulatory Visit (HOSPITAL_COMMUNITY)
Admission: RE | Admit: 2019-10-02 | Discharge: 2019-10-02 | Disposition: A | Discharge status: Home / Self Care | Payer: Medicare Other | Source: Ambulatory Visit | Attending: Cardiovascular Disease | Admitting: Cardiovascular Disease

## 2019-10-02 DIAGNOSIS — Z20822 Contact with and (suspected) exposure to covid-19: Secondary | ICD-10-CM | POA: Diagnosis not present

## 2019-10-02 DIAGNOSIS — Z01812 Encounter for preprocedural laboratory examination: Secondary | ICD-10-CM | POA: Insufficient documentation

## 2019-10-02 LAB — SARS CORONAVIRUS 2 (TAT 6-24 HRS): SARS Coronavirus 2: NEGATIVE

## 2019-10-05 ENCOUNTER — Telehealth: Payer: Self-pay | Admitting: *Deleted

## 2019-10-05 ENCOUNTER — Ambulatory Visit: Payer: Medicare Other | Admitting: Cardiovascular Disease

## 2019-10-05 NOTE — Telephone Encounter (Signed)
Pt contacted pre-catheterization scheduled at [facility] for: Wednesday October 06, 2019 10:30 AM Verified arrival time and place: [facility]) at: 6 AM -pre procedure hydration per Dr Fletcher Anon  No solid food after midnight prior to cath, clear liquids until 5 AM day of procedure. Contrast allergy: no per pt.  Hold: Metformin-day of procedure and 48 hours post procedure. Glipizide-AM of procedure. Toujeo-1/2 usual dose HS prior to procedure. Lisinopril-AM of procedure-GFR 26 Pt states he does not use Insulin in the AM.  Except hold medications AM meds can be  taken pre-cath with sip of water including: ASA 81 mg   Confirmed patient has responsible adult to drive home post procedure and observe 24 hours after arriving home:   Currently, due to Covid-19 pandemic, only one person will be allowed with patient. Must be the same person for patient's entire stay and will be required to wear a mask. They will be asked to wait in the waiting room for the duration of the patient's stay.  Patients are required to wear a mask when they enter the hospital.     COVID-19 Pre-Screening Questions:  . In the past 7 to 10 days have you had a cough,  shortness of breath, headache, congestion, fever (100 or greater) body aches, chills, sore throat, or sudden loss of taste or sense of smell? no . Have you been around anyone with known Covid 19 in the past 7 to 10 days? no . Have you been around anyone who is awaiting Covid 19 test results in the past 7 to 10 days? no . Have you been around anyone who has mentioned symptoms of Covid 19 within the past 7 to 10 days? no  Reviewed procedure/mask/visitor instructions, COVID-19 screening questions with patient.

## 2019-10-06 ENCOUNTER — Other Ambulatory Visit: Payer: Self-pay | Admitting: *Deleted

## 2019-10-06 ENCOUNTER — Encounter (HOSPITAL_COMMUNITY)
Admission: RE | Disposition: A | Discharge status: Home / Self Care | Payer: Self-pay | Source: Home / Self Care | Attending: Cardiovascular Disease

## 2019-10-06 ENCOUNTER — Ambulatory Visit (HOSPITAL_COMMUNITY)
Admission: RE | Admit: 2019-10-06 | Discharge: 2019-10-06 | Disposition: A | Discharge status: Home / Self Care | Payer: Medicare Other | Attending: Cardiovascular Disease | Admitting: Cardiovascular Disease

## 2019-10-06 DIAGNOSIS — I70222 Atherosclerosis of native arteries of extremities with rest pain, left leg: Secondary | ICD-10-CM | POA: Diagnosis not present

## 2019-10-06 DIAGNOSIS — I2510 Atherosclerotic heart disease of native coronary artery without angina pectoris: Secondary | ICD-10-CM | POA: Insufficient documentation

## 2019-10-06 DIAGNOSIS — I6529 Occlusion and stenosis of unspecified carotid artery: Secondary | ICD-10-CM | POA: Diagnosis not present

## 2019-10-06 DIAGNOSIS — Z923 Personal history of irradiation: Secondary | ICD-10-CM | POA: Diagnosis not present

## 2019-10-06 DIAGNOSIS — I129 Hypertensive chronic kidney disease with stage 1 through stage 4 chronic kidney disease, or unspecified chronic kidney disease: Secondary | ICD-10-CM | POA: Diagnosis not present

## 2019-10-06 DIAGNOSIS — E1136 Type 2 diabetes mellitus with diabetic cataract: Secondary | ICD-10-CM | POA: Insufficient documentation

## 2019-10-06 DIAGNOSIS — E11319 Type 2 diabetes mellitus with unspecified diabetic retinopathy without macular edema: Secondary | ICD-10-CM | POA: Insufficient documentation

## 2019-10-06 DIAGNOSIS — Z951 Presence of aortocoronary bypass graft: Secondary | ICD-10-CM | POA: Diagnosis not present

## 2019-10-06 DIAGNOSIS — E7800 Pure hypercholesterolemia, unspecified: Secondary | ICD-10-CM | POA: Insufficient documentation

## 2019-10-06 DIAGNOSIS — I739 Peripheral vascular disease, unspecified: Secondary | ICD-10-CM

## 2019-10-06 DIAGNOSIS — K219 Gastro-esophageal reflux disease without esophagitis: Secondary | ICD-10-CM | POA: Insufficient documentation

## 2019-10-06 DIAGNOSIS — E1151 Type 2 diabetes mellitus with diabetic peripheral angiopathy without gangrene: Secondary | ICD-10-CM | POA: Insufficient documentation

## 2019-10-06 DIAGNOSIS — E1122 Type 2 diabetes mellitus with diabetic chronic kidney disease: Secondary | ICD-10-CM | POA: Insufficient documentation

## 2019-10-06 DIAGNOSIS — N189 Chronic kidney disease, unspecified: Secondary | ICD-10-CM | POA: Insufficient documentation

## 2019-10-06 DIAGNOSIS — Z7982 Long term (current) use of aspirin: Secondary | ICD-10-CM | POA: Diagnosis not present

## 2019-10-06 DIAGNOSIS — Z87891 Personal history of nicotine dependence: Secondary | ICD-10-CM | POA: Insufficient documentation

## 2019-10-06 DIAGNOSIS — Z79899 Other long term (current) drug therapy: Secondary | ICD-10-CM | POA: Insufficient documentation

## 2019-10-06 DIAGNOSIS — Z888 Allergy status to other drugs, medicaments and biological substances status: Secondary | ICD-10-CM | POA: Diagnosis not present

## 2019-10-06 DIAGNOSIS — Z794 Long term (current) use of insulin: Secondary | ICD-10-CM | POA: Diagnosis not present

## 2019-10-06 DIAGNOSIS — Z8546 Personal history of malignant neoplasm of prostate: Secondary | ICD-10-CM | POA: Insufficient documentation

## 2019-10-06 DIAGNOSIS — E785 Hyperlipidemia, unspecified: Secondary | ICD-10-CM | POA: Diagnosis not present

## 2019-10-06 DIAGNOSIS — I70212 Atherosclerosis of native arteries of extremities with intermittent claudication, left leg: Secondary | ICD-10-CM | POA: Diagnosis not present

## 2019-10-06 HISTORY — PX: PERIPHERAL VASCULAR INTERVENTION: CATH118257

## 2019-10-06 HISTORY — PX: ABDOMINAL AORTOGRAM W/LOWER EXTREMITY: CATH118223

## 2019-10-06 LAB — POCT ACTIVATED CLOTTING TIME: Activated Clotting Time: 219 seconds

## 2019-10-06 LAB — GLUCOSE, CAPILLARY: Glucose-Capillary: 181 mg/dL — ABNORMAL HIGH (ref 70–99)

## 2019-10-06 SURGERY — ABDOMINAL AORTOGRAM W/LOWER EXTREMITY
Anesthesia: LOCAL

## 2019-10-06 MED ORDER — SODIUM CHLORIDE 0.9 % IV SOLN: 250.0000 mL | INTRAVENOUS | Status: DC | PRN

## 2019-10-06 MED ORDER — CLOPIDOGREL BISULFATE 300 MG PO TABS
ORAL_TABLET | ORAL | Status: AC
  Filled 2019-10-06: qty 1

## 2019-10-06 MED ORDER — SODIUM CHLORIDE 0.9% FLUSH: 3.0000 mL | Freq: Two times a day (BID) | INTRAVENOUS | Status: DC

## 2019-10-06 MED ORDER — HEPARIN SODIUM (PORCINE) 1000 UNIT/ML IJ SOLN
INTRAMUSCULAR | Status: DC | PRN
  Administered 2019-10-06: 2000 [IU] via INTRAVENOUS
  Administered 2019-10-06: 7000 [IU] via INTRAVENOUS

## 2019-10-06 MED ORDER — SODIUM CHLORIDE 0.9 % WEIGHT BASED INFUSION
3.0000 mL/kg/h | INTRAVENOUS | Status: AC
  Administered 2019-10-06: 07:00:00 3 mL/kg/h via INTRAVENOUS

## 2019-10-06 MED ORDER — ONDANSETRON HCL 4 MG/2ML IJ SOLN: 4.0000 mg | Freq: Four times a day (QID) | INTRAMUSCULAR | Status: DC | PRN

## 2019-10-06 MED ORDER — MIDAZOLAM HCL 2 MG/2ML IJ SOLN
INTRAMUSCULAR | Status: AC
  Filled 2019-10-06: qty 2

## 2019-10-06 MED ORDER — ACETAMINOPHEN 325 MG PO TABS: 650.0000 mg | ORAL_TABLET | ORAL | Status: DC | PRN

## 2019-10-06 MED ORDER — CLOPIDOGREL BISULFATE 75 MG PO TABS: 75.0000 mg | ORAL_TABLET | Freq: Every day | ORAL | 3 refills | Status: DC

## 2019-10-06 MED ORDER — ASPIRIN 81 MG PO CHEW: 81.0000 mg | CHEWABLE_TABLET | ORAL | Status: DC

## 2019-10-06 MED ORDER — LIDOCAINE HCL (PF) 1 % IJ SOLN
INTRAMUSCULAR | Status: AC
  Filled 2019-10-06: qty 30

## 2019-10-06 MED ORDER — HEPARIN (PORCINE) IN NACL 1000-0.9 UT/500ML-% IV SOLN
INTRAVENOUS | Status: DC | PRN
  Administered 2019-10-06: 500 mL

## 2019-10-06 MED ORDER — LABETALOL HCL 5 MG/ML IV SOLN: 10.0000 mg | INTRAVENOUS | Status: DC | PRN

## 2019-10-06 MED ORDER — CLOPIDOGREL BISULFATE 300 MG PO TABS
ORAL_TABLET | ORAL | Status: DC | PRN
  Administered 2019-10-06: 300 mg via ORAL

## 2019-10-06 MED ORDER — MIDAZOLAM HCL 2 MG/2ML IJ SOLN
INTRAMUSCULAR | Status: DC | PRN
  Administered 2019-10-06: 1 mg via INTRAVENOUS

## 2019-10-06 MED ORDER — SODIUM CHLORIDE 0.9% FLUSH: 3.0000 mL | INTRAVENOUS | Status: DC | PRN

## 2019-10-06 MED ORDER — SODIUM CHLORIDE 0.9 % IV SOLN: INTRAVENOUS | Status: DC

## 2019-10-06 MED ORDER — LIDOCAINE HCL (PF) 1 % IJ SOLN
INTRAMUSCULAR | Status: DC | PRN
  Administered 2019-10-06: 20 mL

## 2019-10-06 MED ORDER — IODIXANOL 320 MG/ML IV SOLN
INTRAVENOUS | Status: DC | PRN
  Administered 2019-10-06: 115 mL via INTRA_ARTERIAL

## 2019-10-06 MED ORDER — FENTANYL CITRATE (PF) 100 MCG/2ML IJ SOLN
INTRAMUSCULAR | Status: DC | PRN
  Administered 2019-10-06 (×2): 25 ug via INTRAVENOUS

## 2019-10-06 MED ORDER — HEPARIN SODIUM (PORCINE) 1000 UNIT/ML IJ SOLN
INTRAMUSCULAR | Status: AC
  Filled 2019-10-06: qty 1

## 2019-10-06 MED ORDER — SODIUM CHLORIDE 0.9 % WEIGHT BASED INFUSION: 1.0000 mL/kg/h | INTRAVENOUS | Status: DC

## 2019-10-06 MED ORDER — FENTANYL CITRATE (PF) 100 MCG/2ML IJ SOLN
INTRAMUSCULAR | Status: AC
  Filled 2019-10-06: qty 2

## 2019-10-06 MED ORDER — HEPARIN (PORCINE) IN NACL 1000-0.9 UT/500ML-% IV SOLN
INTRAVENOUS | Status: AC
  Filled 2019-10-06: qty 1000

## 2019-10-06 SURGICAL SUPPLY — 25 items
CATH ANGIO 5F PIGTAIL 65CM (CATHETERS) ×3 IMPLANT
CATH CROSS OVER TEMPO 5F (CATHETERS) ×3 IMPLANT
CATH SHOCKWAVE 5.0X60 (CATHETERS) ×3 IMPLANT
CATH SHOCKWAVE 6.5X60 (CATHETERS) ×3 IMPLANT
CATH STRAIGHT 5FR 65CM (CATHETERS) ×3 IMPLANT
CLOSURE MYNX CONTROL 6F/7F (Vascular Products) ×3 IMPLANT
DCB RANGER 5.0X150 150 (BALLOONS) ×2 IMPLANT
DCB RANGER 6.0X60 135 (BALLOONS) ×2 IMPLANT
KIT ENCORE 26 ADVANTAGE (KITS) ×3 IMPLANT
KIT MICROPUNCTURE NIT STIFF (SHEATH) ×3 IMPLANT
KIT PV (KITS) ×3 IMPLANT
PAD PRO RADIOLUCENT 2001M-C (PAD) ×3 IMPLANT
RANGER DCB 5.0X150 150 (BALLOONS) ×3
RANGER DCB 6.0X60 135 (BALLOONS) ×3
SHEATH PINNACLE 5F 10CM (SHEATH) ×3 IMPLANT
SHEATH PINNACLE 7F 10CM (SHEATH) ×3 IMPLANT
SHEATH PINNACLE MP 7F 45CM (SHEATH) ×6 IMPLANT
SHEATH PROBE COVER 6X72 (BAG) ×3 IMPLANT
SYR MEDRAD MARK 7 150ML (SYRINGE) ×3 IMPLANT
TAPE VIPERTRACK RADIOPAQ (MISCELLANEOUS) ×2 IMPLANT
TAPE VIPERTRACK RADIOPAQUE (MISCELLANEOUS) ×3
TRANSDUCER W/STOPCOCK (MISCELLANEOUS) ×3 IMPLANT
TRAY PV CATH (CUSTOM PROCEDURE TRAY) ×3 IMPLANT
WIRE BENTSON .035X145CM (WIRE) ×3 IMPLANT
WIRE ZILIENT 014 4G (WIRE) ×3 IMPLANT

## 2019-10-06 NOTE — Interval H&P Note (Signed)
History and Physical Interval Note:  10/06/2019 11:10 AM  Clayton Harrison  has presented today for surgery, with the diagnosis of pad.  The various methods of treatment have been discussed with the patient and family. After consideration of risks, benefits and other options for treatment, the patient has consented to  Procedure(s): ABDOMINAL AORTOGRAM W/LOWER EXTREMITY (N/A) as a surgical intervention.  The patient's history has been reviewed, patient examined, no change in status, stable for surgery.  I have reviewed the patient's chart and labs.  Questions were answered to the patient's satisfaction.     Kathlyn Sacramento

## 2019-10-06 NOTE — Discharge Instructions (Signed)
DRINK PLENTY OF FLUIDS FOR THE NEXT 2-3 DAYS.   Femoral Site Care This sheet gives you information about how to care for yourself after your procedure. Your health care provider may also give you more specific instructions. If you have problems or questions, contact your health care provider. What can I expect after the procedure? After the procedure, it is common to have:  Bruising that usually fades within 1-2 weeks.  Tenderness at the site. Follow these instructions at home: Wound care  Follow instructions from your health care provider about how to take care of your insertion site. Make sure you: ? Wash your hands with soap and water before you change your bandage (dressing). If soap and water are not available, use hand sanitizer. ? Change your dressing as told by your health care provider. ? Leave stitches (sutures), skin glue, or adhesive strips in place. These skin closures may need to stay in place for 2 weeks or longer. If adhesive strip edges start to loosen and curl up, you may trim the loose edges. Do not remove adhesive strips completely unless your health care provider tells you to do that.  Do not take baths, swim, or use a hot tub until your health care provider approves.  You may shower 24-48 hours after the procedure or as told by your health care provider. ? Gently wash the site with plain soap and water. ? Pat the area dry with a clean towel. ? Do not rub the site. This may cause bleeding.  Do not apply powder or lotion to the site. Keep the site clean and dry.  Check your femoral site every day for signs of infection. Check for: ? Redness, swelling, or pain. ? Fluid or blood. ? Warmth. ? Pus or a bad smell. Activity  For the first 2-3 days after your procedure, or as long as directed: ? Avoid climbing stairs as much as possible. ? Do not squat.  Do not lift anything that is heavier than 10 lb (4.5 kg), or the limit that you are told, until your health  care provider says that it is safe.  Rest as directed. ? Avoid sitting for a long time without moving. Get up to take short walks every 1-2 hours.  Do not drive for 24 hours if you were given a medicine to help you relax (sedative). General instructions  Take over-the-counter and prescription medicines only as told by your health care provider.  Keep all follow-up visits as told by your health care provider. This is important. Contact a health care provider if you have:  A fever or chills.  You have redness, swelling, or pain around your insertion site. Get help right away if:  The catheter insertion area swells very fast.  You pass out.  You suddenly start to sweat or your skin gets clammy.  The catheter insertion area is bleeding, and the bleeding does not stop when you hold steady pressure on the area.  The area near or just beyond the catheter insertion site becomes pale, cool, tingly, or numb. These symptoms may represent a serious problem that is an emergency. Do not wait to see if the symptoms will go away. Get medical help right away. Call your local emergency services (911 in the [place] Do not drive yourself to the hospital. Summary  After the procedure, it is common to have bruising that usually fades within 1-2 weeks.  Check your femoral site every day for signs of infection.  Do not lift  anything that is heavier than 10 lb (4.5 kg), or the limit that you are told, until your health care provider says that it is safe. This information is not intended to replace advice given to you by your health care provider. Make sure you discuss any questions you have with your health care provider. Document Revised: 06/23/2017 Document Reviewed: 06/23/2017 Elsevier Patient Education  2020 [employer].

## 2019-10-11 ENCOUNTER — Other Ambulatory Visit: Payer: Self-pay | Admitting: *Deleted

## 2019-10-11 MED ORDER — MECLIZINE HCL 25 MG PO TABS: ORAL_TABLET | ORAL | 1 refills | Status: DC

## 2019-10-15 ENCOUNTER — Telehealth: Payer: Self-pay | Admitting: Cardiovascular Disease

## 2019-10-15 NOTE — Telephone Encounter (Signed)
     I was in pt's chart to see who called him today.(10-15-19)

## 2019-10-18 ENCOUNTER — Other Ambulatory Visit: Payer: Self-pay | Admitting: *Deleted

## 2019-10-18 MED ORDER — PROAIR RESPICLICK 108 (90 BASE) MCG/ACT IN AEPB: 2.0000 | INHALATION_SPRAY | Freq: Four times a day (QID) | RESPIRATORY_TRACT | 4 refills | Status: AC | PRN

## 2019-10-20 ENCOUNTER — Ambulatory Visit (HOSPITAL_COMMUNITY)
Admission: RE | Admit: 2019-10-20 | Discharge: 2019-10-20 | Disposition: A | Discharge status: Home / Self Care | Payer: Medicare Other | Source: Ambulatory Visit | Attending: Internal Medicine | Admitting: Internal Medicine

## 2019-10-20 ENCOUNTER — Other Ambulatory Visit: Payer: Self-pay

## 2019-10-20 DIAGNOSIS — I739 Peripheral vascular disease, unspecified: Secondary | ICD-10-CM

## 2019-10-20 MED ORDER — CLOPIDOGREL BISULFATE 75 MG PO TABS: 75.0000 mg | ORAL_TABLET | Freq: Every day | ORAL | 3 refills | Status: DC

## 2019-10-22 ENCOUNTER — Other Ambulatory Visit: Payer: Self-pay | Admitting: *Deleted

## 2019-10-22 DIAGNOSIS — I739 Peripheral vascular disease, unspecified: Secondary | ICD-10-CM

## 2019-11-02 ENCOUNTER — Encounter: Payer: Self-pay | Admitting: Cardiovascular Disease

## 2019-11-02 ENCOUNTER — Other Ambulatory Visit: Payer: Self-pay

## 2019-11-02 ENCOUNTER — Ambulatory Visit: Payer: Medicare Other | Admitting: Cardiovascular Disease

## 2019-11-02 VITALS — BP 158/74 | HR 64 | Ht 69.0 in | Wt 209.0 lb

## 2019-11-02 DIAGNOSIS — I739 Peripheral vascular disease, unspecified: Secondary | ICD-10-CM | POA: Diagnosis not present

## 2019-11-02 DIAGNOSIS — I779 Disorder of arteries and arterioles, unspecified: Secondary | ICD-10-CM | POA: Diagnosis not present

## 2019-11-02 DIAGNOSIS — I10 Essential (primary) hypertension: Secondary | ICD-10-CM | POA: Diagnosis not present

## 2019-11-02 DIAGNOSIS — E785 Hyperlipidemia, unspecified: Secondary | ICD-10-CM | POA: Diagnosis not present

## 2019-11-02 DIAGNOSIS — I2510 Atherosclerotic heart disease of native coronary artery without angina pectoris: Secondary | ICD-10-CM | POA: Diagnosis not present

## 2019-11-02 NOTE — Patient Instructions (Signed)
Medication Instructions:  No changes *If you need a refill on your cardiac medications before your next appointment, please call your pharmacy*   Lab Work: No changes If you have labs (blood work) drawn today and your tests are completely normal, you will receive your results only by: Marland Kitchen MyChart Message (if you have MyChart) OR . A paper copy in the mail If you have any lab test that is abnormal or we need to change your treatment, we will call you to review the results.   Testing/Procedures: Your physician has requested that you have a lower extremity arterial duplex in October. During this test, ultrasound is used to evaluate arterial blood flow in the legs. Allow one hour for this exam. There are no restrictions or special instructions. This will take place at [address], [address].  Your physician has requested that you have an ankle brachial index (ABI) in October. During this test an ultrasound and blood pressure cuff are used to evaluate the arteries that supply the arms and legs with blood. Allow thirty minutes for this exam. There are no restrictions or special instructions. This will take place at [place], [address].   Follow-Up: At [facility], you and your health needs are our priority.  As part of our continuing mission to provide you with exceptional heart care, we have created designated Provider Care Teams.  These Care Teams include your primary Cardiologist (physician) and Advanced Practice Providers (APPs -  Physician Assistants and Nurse Practitioners) who all work together to provide you with the care you need, when you need it.  We recommend signing up for the patient portal called "MyChart".  Sign up information is provided on this After Visit Summary.  MyChart is used to connect with patients for Virtual Visits (Telemedicine).  Patients are able to view lab/test results, encounter notes, upcoming appointments, etc.  Non-urgent messages can be sent to  your provider as well.   To learn more about what you can do with MyChart, go to [email or web address].    Your next appointment:   6 month(s)  The format for your next appointment:   In Person  Provider:   Kathlyn Sacramento, MD

## 2019-11-02 NOTE — Progress Notes (Signed)
Cardiology Office Note   Date:  11/02/2019   ID:  Clayton Harrison, DOB 03-Jan-1931, MRN [id number]  PCP:  Hali Marry, MD  Cardiologist: Dr. Stanford Breed  No chief complaint on file.     History of Present Illness: Clayton Harrison is a 86 y.o. male who is here today for follow-up visit regarding peripheral arterial disease.  He has known history of coronary artery disease status post CABG in 2001,  carotid disease status post right carotid endarterectomy, essential hypertension, hyperlipidemia, CKD, GERD, diabetes mellitus and peripheral arterial disease. He was seen recently for severe bilateral leg claudication and rest pain worse on the left side.  He underwent recent noninvasive vascular studies which showed an ABI of 0.72 on the right and 0.53 on the left.  Duplex showed moderate right common femoral artery disease and significant right SFA disease with two-vessel runoff below the knee.  On the left, there was significant common femoral artery disease and severe proximal SFA disease with two-vessel runoff below the knee.  I proceeded with angiography last month which showed no significant aortoiliac disease.  On the right side, there was borderline significant ostial SFA stenosis and severe stenosis in the distal segment.  On the left side, there was severe calcified stenosis affecting the common femoral artery and proximal and mid SFA with two-vessel runoff below the knee.  I performed successful intravascular lithotripsy and drug-coated balloon angioplasty to the left common femoral artery and left SFA. Postprocedure vascular studies showed an ABI of 0.93 on the right and 1.02 on the left. He reports significant improvement in symptoms and resolution of claudication and rest pain.  He is feeling great.  No chest pain or shortness of breath.  Past Medical History:  Diagnosis Date  . Adenomatous colon polyp    2.5cm excised by right colectomy 2011  . Anemia, blood loss    history of  . Blockage of coronary artery of heart ([place]) 2001   x 4  . CAD (coronary artery disease)   . Carotid artery occlusion   . Cataract   . Cerebrovascular disease, unspecified   . Colon tumor 03/2010   right ascending  . Diabetic retinopathy ([place]) 09/29/2017  . DM (diabetes mellitus) ([place])   . Elevated PSA   . GERD (gastroesophageal reflux disease)   . Hearing loss   . HTN (hypertension)   . Hypercholesteremia   . Interstitial cystitis   . Macular degeneration   . Other and unspecified hyperlipidemia   . Prostate cancer ([place]) 03/09/14   Gleason 4+5=9, volume 98 mL  . PVD (peripheral vascular disease) ([place])   . S/P radiation therapy 07/20/2014 through 09/13/2014    Prostate 7800 cGy in 40 sessions, seminal vesicles 5600 cGy in 40 sessions   . SBO (small bowel obstruction) ([place]) 07/2013  . Skin cancer 2012   melanoma upper back, removed  . Statin intolerance   . Vertigo   . Vertigo     Past Surgical History:  Procedure Laterality Date  . ABDOMINAL AORTOGRAM W/LOWER EXTREMITY N/A 10/06/2019   Procedure: ABDOMINAL AORTOGRAM W/LOWER EXTREMITY;  Surgeon: Wellington Hampshire, MD;  Location: [facility];  Service: Cardiovascular;  Laterality: N/A;  . APPENDECTOMY  05/2010   with right hemicolectomy  . CARDIAC CATHETERIZATION  11/1999  . CAROTID ENDARTERECTOMY  2012   right  . CORONARY ARTERY BYPASS GRAFT  2001   4  . dental extractions Bilateral 1980  . DENTAL SURGERY    .  [place]  . left carpal tunnel release  2009  . PARTIAL COLECTOMY  05/24/2010   Lap converted to open right proximal colectomy  . PERIPHERAL VASCULAR INTERVENTION  10/06/2019   Procedure: PERIPHERAL VASCULAR INTERVENTION;  Surgeon: Wellington Hampshire, MD;  Location: [facility];  Service: Cardiovascular;;  left lower extremity  . PROSTATE BIOPSY  03/09/14   Gleason 9, vol 98 mL  . SKIN CANCER EXCISION   02/2011   upper back  . TONSILLECTOMY  1940  . URETEROLITHOTOMY       Current Outpatient Medications  Medication Sig Dispense Refill  . Albuterol Sulfate (PROAIR RESPICLICK) 123XX123 (90 Base) MCG/ACT AEPB Inhale 2 puffs into the lungs every 6 (six) hours as needed. 3 each 4  . amLODipine (NORVASC) 5 MG tablet Take 5 mg by mouth daily.    Marland Kitchen aspirin EC 81 MG tablet Take 1 tablet (81 mg total) by mouth daily. 90 tablet 3  . clopidogrel (PLAVIX) 75 MG tablet Take 1 tablet (75 mg total) by mouth daily. 90 tablet 3  . DULoxetine (CYMBALTA) 60 MG capsule TAKE 1 CAPSULE BY MOUTH  DAILY (Patient taking differently: Take 60 mg by mouth daily. ) 90 capsule 3  . glipiZIDE (GLUCOTROL) 10 MG tablet TAKE 1 TABLET BY MOUTH  TWICE A DAY BEFORE MEALS (Patient taking differently: Take 10 mg by mouth 2 (two) times daily before a meal. ) 180 tablet 3  . Insulin Pen Needle (BD PEN NEEDLE NANO U/F) 32G X 4 MM MISC INJECT SUBCUTANEOUSLY DAILY 90 each 3  . lisinopril (ZESTRIL) 20 MG tablet TAKE 1 TABLET BY MOUTH AT  BEDTIME (Patient taking differently: Take 20 mg by mouth daily. ) 90 tablet 3  . meclizine (ANTIVERT) 25 MG tablet TAKE ONE-HALF TABLET BY  MOUTH TWO TIMES DAILY AS  NEEDED FOR DIZZINESS 90 tablet 1  . metFORMIN (GLUCOPHAGE) 1000 MG tablet TAKE 1 TABLET BY MOUTH  TWICE DAILY WITH MEALS (Patient taking differently: Take 1,000 mg by mouth 2 (two) times daily with a meal. ) 180 tablet 3  . metoprolol tartrate (LOPRESSOR) 50 MG tablet Take 1 tablet (50 mg total) by mouth 2 (two) times daily. 180 tablet 3  . Multiple Vitamins-Minerals (PRESERVISION AREDS 2+MULTI VIT) CAPS Take 1 capsule by mouth 2 (two) times daily at 10 AM and 5 PM.    . rosuvastatin (CRESTOR) 10 MG tablet Take 1 tablet (10 mg total) by mouth daily. 90 tablet 3  . tamsulosin (FLOMAX) 0.4 MG CAPS capsule Take 0.4 mg by mouth daily.    Nelva Nay SOLOSTAR 300 UNIT/ML Solostar Pen INJECT 38 UNITS INTO THE  SKIN AT BEDTIME 13.5 mL 3  . umeclidinium  bromide (INCRUSE ELLIPTA) 62.5 MCG/INH AEPB Inhale 1 puff into the lungs daily. 90 each 3   No current facility-administered medications for this visit.    Allergies:   Shellfish allergy, Statins, Atorvastatin, Ezetimibe, Flomax [tamsulosin hcl], Prednisone, and Tizanidine    Social History:  The patient  reports that he quit smoking about 19 years ago. He has a 80.00 pack-year smoking history. His smokeless tobacco use includes chew. He reports that he does not drink alcohol or use drugs.   Family History:  The patient's family history includes Cancer in his cousin, daughter, daughter, and maternal uncle; Colon cancer in his maternal uncle; Diabetes in his mother; Heart disease in his father and mother; Heart failure in his mother; Hypertension in his mother.    ROS:  Please see the history of present illness.   Otherwise, review of systems are positive for none.   All other systems are reviewed and negative.    PHYSICAL EXAM: VS:  BP (!) 158/74   Pulse 64   Ht 5\' 9"  (1.753 m)   Wt 209 lb (94.8 kg)   SpO2 95%   BMI 30.86 kg/m  , BMI Body mass index is 30.86 kg/m. GEN: Well nourished, well developed, in no acute distress  HEENT: normal  Neck: no JVD, carotid bruits, or masses Cardiac: RRR; no murmurs, rubs, or gallops,no edema  Respiratory:  clear to auscultation bilaterally, normal work of breathing GI: soft, nontender, nondistended, + BS MS: no deformity or atrophy  Skin: warm and dry, no rash Neuro:  Strength and sensation are intact Psych: euthymic mood, full affect Vascular: Femoral pulses +2 on the right and +2 on the left.  Distal pulses are faint.  No groin hematoma   EKG:  EKG is not ordered today.    Recent Labs: 01/05/2019: Magnesium 1.7; TSH 2.02 09/28/2019: ALT 15; BUN 17; Creatinine, Ser 1.12; Hemoglobin 12.3; Platelets 227; Potassium 5.5; Sodium 139    Lipid Panel    Component Value Date/Time   CHOL 122 09/28/2019 1056   TRIG 111 09/28/2019 1056   TRIG  202 (HH) 06/05/2006 0828   HDL 50 09/28/2019 1056   CHOLHDL 2.4 09/28/2019 1056   CHOLHDL 4.2 04/08/2019 1551   VLDL 67 (H) 07/20/2015 1003   LDLCALC 52 09/28/2019 1056   LDLCALC 99 04/08/2019 1551   LDLDIRECT 104.2 03/06/2012 0845      Wt Readings from Last 3 Encounters:  11/02/19 209 lb (94.8 kg)  10/06/19 210 lb (95.3 kg)  09/28/19 210 lb (95.3 kg)        No flowsheet data found.    ASSESSMENT AND PLAN:  1.  Peripheral arterial disease with rest pain worse on the left side: Status post recent successful intravascular lithotripsy and drug-coated balloon angioplasty in the left common femoral artery and left SFA with excellent results.  Postprocedure ABI improved to normal.  Surprisingly, the ABI on the right side also improved and he reports resolution of all leg pain.  Continue dual antiplatelet therapy for now likely for another 6 months.  Repeat vascular studies in October. If he develops symptoms on the right side, right SFA endovascular intervention can be considered given known disease there.  2.  Coronary artery disease involving native coronary arteries without angina: Continue medical therapy.  3.  Essential hypertension: Blood pressure is mildly elevated today.  Continue to monitor for now.  4.  Hyperlipidemia: Continue treatment with rosuvastatin.  Most recent lipid profile showed an LDL of 54.  5.  Carotid disease status post right carotid enterectomy.  Most recent Doppler showed mild nonobstructive disease.    Disposition:   FU with me in 6 months  Signed,  Kathlyn Sacramento, MD  11/02/2019 9:59 AM    Clayton Harrison

## 2019-11-03 ENCOUNTER — Encounter: Payer: Self-pay | Admitting: Family Medicine

## 2019-11-15 ENCOUNTER — Ambulatory Visit: Payer: Medicare Other | Admitting: Cardiology

## 2019-11-16 ENCOUNTER — Ambulatory Visit (INDEPENDENT_AMBULATORY_CARE_PROVIDER_SITE_OTHER): Payer: Medicare Other | Admitting: Family Medicine

## 2019-11-16 ENCOUNTER — Telehealth: Payer: Self-pay

## 2019-11-16 ENCOUNTER — Other Ambulatory Visit: Payer: Self-pay

## 2019-11-16 ENCOUNTER — Encounter: Payer: Self-pay | Admitting: Family Medicine

## 2019-11-16 VITALS — BP 150/62 | HR 76 | Ht 69.0 in | Wt 210.0 lb

## 2019-11-16 DIAGNOSIS — E1151 Type 2 diabetes mellitus with diabetic peripheral angiopathy without gangrene: Secondary | ICD-10-CM | POA: Diagnosis not present

## 2019-11-16 DIAGNOSIS — M79605 Pain in left leg: Secondary | ICD-10-CM

## 2019-11-16 DIAGNOSIS — H8110 Benign paroxysmal vertigo, unspecified ear: Secondary | ICD-10-CM | POA: Diagnosis not present

## 2019-11-16 DIAGNOSIS — R7989 Other specified abnormal findings of blood chemistry: Secondary | ICD-10-CM

## 2019-11-16 DIAGNOSIS — R252 Cramp and spasm: Secondary | ICD-10-CM

## 2019-11-16 DIAGNOSIS — R42 Dizziness and giddiness: Secondary | ICD-10-CM

## 2019-11-16 DIAGNOSIS — I10 Essential (primary) hypertension: Secondary | ICD-10-CM

## 2019-11-16 LAB — POCT GLYCOSYLATED HEMOGLOBIN (HGB A1C): Hemoglobin A1C: 6.7 % — AB (ref 4.0–5.6)

## 2019-11-16 NOTE — Telephone Encounter (Signed)
Pt seen in office today.

## 2019-11-16 NOTE — Telephone Encounter (Signed)
Caren Griffins would like to speak to [facility] or Dr Madilyn Fireman about her dads last visit.

## 2019-11-16 NOTE — Progress Notes (Addendum)
Established Patient Office Visit  Subjective:  Patient ID: Clayton Harrison, male    DOB: Aug 09, 1930  Age: 86 y.o. MRN: [id number]  CC:  Chief Complaint  Patient presents with  . Hypertension    HPI Clayton Harrison presents for follow-up.  He did let me know that he had previously been complaining of some leg "weakness".  He had ABIs performed at the request of his daughter and he had an ABI of 0.72 on the right and 0.53 on the left.  He had successful intravascular lithotripsy and drug-coated balloon angioplasty in the left common femoral artery in the left SFA.  He is here today because ports he has not been feeling well.  For several months even before his surgery he has been feeling "dizzy".  He says he feels like he is almost moving a little bit.  He notices it mostly when he looks up while getting his medications at his cabinet or if he is moving very quickly.  Otherwise its not bothersome.  He says when it does happen it last for about 20 to 30 seconds and then it resolves it is been happening almost daily at this point.  He has been diagnosed with vertigo before and it similar he just never had it last for months at a time and so was concerned.  He denies any new other neurologic symptoms.  No ear pressure pain.  No sinus congestion or symptoms.  Diabetes - no hypoglycemic events. No wounds or sores that are not healing well. No increased thirst or urination. Checking glucose at home. Taking medications as prescribed without any side effects.  Due for foot exam today.  Recently restarted his Crestor since his surgery. Will add back to his med list.    Also reports he has been getting some muscle cramping in his left leg which is the like he had surgery on.  It seems to be a little worse at night.  He admits he has not been doing the stretches for his left leg denies any new onset swelling or redness or rash  Past Medical History:  Diagnosis Date  . Adenomatous colon polyp    2.5cm  excised by right colectomy 2011  . Anemia, blood loss    history of  . Blockage of coronary artery of heart ([place]) 2001   x 4  . CAD (coronary artery disease)   . Carotid artery occlusion   . Cataract   . Cerebrovascular disease, unspecified   . Colon tumor 03/2010   right ascending  . Diabetic retinopathy ([place]) 09/29/2017  . DM (diabetes mellitus) ([place])   . Elevated PSA   . GERD (gastroesophageal reflux disease)   . Hearing loss   . HTN (hypertension)   . Hypercholesteremia   . Interstitial cystitis   . Macular degeneration   . Other and unspecified hyperlipidemia   . Prostate cancer ([place]) 03/09/14   Gleason 4+5=9, volume 98 mL  . PVD (peripheral vascular disease) ([place])   . S/P radiation therapy 07/20/2014 through 09/13/2014    Prostate 7800 cGy in 40 sessions, seminal vesicles 5600 cGy in 40 sessions   . SBO (small bowel obstruction) ([place]) 07/2013  . Skin cancer 2012   melanoma upper back, removed  . Statin intolerance   . Vertigo   . Vertigo     Past Surgical History:  Procedure Laterality Date  . ABDOMINAL AORTOGRAM W/LOWER EXTREMITY N/A 10/06/2019   Procedure: ABDOMINAL AORTOGRAM W/LOWER EXTREMITY;  Surgeon: Kathlyn Sacramento  A, MD;  Location: [facility];  Service: Cardiovascular;  Laterality: N/A;  . APPENDECTOMY  05/2010   with right hemicolectomy  . CARDIAC CATHETERIZATION  11/1999  . CAROTID ENDARTERECTOMY  2012   right  . CORONARY ARTERY BYPASS GRAFT  2001   4  . dental extractions Bilateral 1980  . DENTAL SURGERY    . [place]  . left carpal tunnel release  2009  . PARTIAL COLECTOMY  05/24/2010   Lap converted to open right proximal colectomy  . PERIPHERAL VASCULAR INTERVENTION  10/06/2019   Procedure: PERIPHERAL VASCULAR INTERVENTION;  Surgeon: Wellington Hampshire, MD;  Location: [facility];  Service: Cardiovascular;;  left lower extremity  . PROSTATE BIOPSY   03/09/14   Gleason 9, vol 98 mL  . SKIN CANCER EXCISION  02/2011   upper back  . TONSILLECTOMY  1940  . URETEROLITHOTOMY      Family History  Problem Relation Age of Onset  . Diabetes Mother   . Heart disease Mother   . Heart failure Mother   . Hypertension Mother   . Heart disease Father   . Cancer Cousin        colon  . Colon cancer Maternal Uncle        x4  . Cancer Maternal Uncle        colon  . Cancer Daughter        breast  . Cancer Daughter        breast    Social History   Socioeconomic History  . Marital status: Married    Spouse name: [name]  . Number of children: 2  . Years of education: HS  . Highest education level: Not on file  Occupational History  . Occupation: Retired  Tobacco Use  . Smoking status: Former Smoker    Packs/day: 2.00    Years: 40.00    Pack years: 80.00    Quit date: 11/23/1999    Years since quitting: 20.0  . Smokeless tobacco: Current User    Types: Chew  Substance and Sexual Activity  . Alcohol use: No  . Drug use: No  . Sexual activity: Yes    Partners: Female  Other Topics Concern  . Not on file  Social History Narrative   No regular exercise. Daily caffeine- 3-4 cups daily         Social Determinants of Health   Financial Resource Strain:   . Difficulty of Paying Living Expenses:   Food Insecurity:   . Worried About Charity fundraiser in the Last Year:   . Arboriculturist in the Last Year:   Transportation Needs:   . Film/video editor (Medical):   Marland Kitchen Lack of Transportation (Non-Medical):   Physical Activity:   . Days of Exercise per Week:   . Minutes of Exercise per Session:   Stress:   . Feeling of Stress :   Social Connections:   . Frequency of Communication with Friends and Family:   . Frequency of Social Gatherings with Friends and Family:   . Attends Religious Services:   . Active Member of Clubs or Organizations:   . Attends Archivist Meetings:   Marland Kitchen Marital Status:   Intimate Partner  Violence:   . Fear of Current or Ex-Partner:   . Emotionally Abused:   Marland Kitchen Physically Abused:   . Sexually Abused:     Outpatient Medications Prior to Visit  Medication Sig  Dispense Refill  . Albuterol Sulfate (PROAIR RESPICLICK) 123XX123 (90 Base) MCG/ACT AEPB Inhale 2 puffs into the lungs every 6 (six) hours as needed. 3 each 4  . aspirin EC 81 MG tablet Take 1 tablet (81 mg total) by mouth daily. 90 tablet 3  . clopidogrel (PLAVIX) 75 MG tablet Take 1 tablet (75 mg total) by mouth daily. 90 tablet 3  . DULoxetine (CYMBALTA) 60 MG capsule TAKE 1 CAPSULE BY MOUTH  DAILY (Patient taking differently: Take 60 mg by mouth daily. ) 90 capsule 3  . glipiZIDE (GLUCOTROL) 10 MG tablet TAKE 1 TABLET BY MOUTH  TWICE A DAY BEFORE MEALS (Patient taking differently: Take 10 mg by mouth 2 (two) times daily before a meal. ) 180 tablet 3  . Insulin Pen Needle (BD PEN NEEDLE NANO U/F) 32G X 4 MM MISC INJECT SUBCUTANEOUSLY DAILY 90 each 3  . lisinopril (ZESTRIL) 20 MG tablet TAKE 1 TABLET BY MOUTH AT  BEDTIME (Patient taking differently: Take 20 mg by mouth daily. ) 90 tablet 3  . meclizine (ANTIVERT) 25 MG tablet TAKE ONE-HALF TABLET BY  MOUTH TWO TIMES DAILY AS  NEEDED FOR DIZZINESS 90 tablet 1  . metFORMIN (GLUCOPHAGE) 1000 MG tablet TAKE 1 TABLET BY MOUTH  TWICE DAILY WITH MEALS (Patient taking differently: Take 1,000 mg by mouth 2 (two) times daily with a meal. ) 180 tablet 3  . metoprolol tartrate (LOPRESSOR) 50 MG tablet Take 1 tablet (50 mg total) by mouth 2 (two) times daily. 180 tablet 3  . Multiple Vitamins-Minerals (PRESERVISION AREDS 2+MULTI VIT) CAPS Take 1 capsule by mouth 2 (two) times daily at 10 AM and 5 PM.    . rosuvastatin (CRESTOR) 10 MG tablet Take 1 tablet (10 mg total) by mouth daily. 90 tablet 3  . TOUJEO SOLOSTAR 300 UNIT/ML Solostar Pen INJECT 38 UNITS INTO THE  SKIN AT BEDTIME 13.5 mL 3  . umeclidinium bromide (INCRUSE ELLIPTA) 62.5 MCG/INH AEPB Inhale 1 puff into the lungs daily. 90 each  3  . amLODipine (NORVASC) 5 MG tablet Take 5 mg by mouth daily.    . tamsulosin (FLOMAX) 0.4 MG CAPS capsule Take 0.4 mg by mouth daily.     No facility-administered medications prior to visit.    Allergies  Allergen Reactions  . Shellfish Allergy Anaphylaxis    Swelling of the throat  . Statins Other (See Comments)    Myalgias, urinary frequency  . Atorvastatin Other (See Comments)    urinary frequency, palpitations  . Ezetimibe Other (See Comments)    "hip problems"  . Flomax [Tamsulosin Hcl] Other (See Comments)    Complains of dizziness  . Prednisone Other (See Comments)    Crazy dreams, insomnia  . Tizanidine Other (See Comments)    Insomnia, nightmares    ROS Review of Systems    Objective:    Physical Exam  Constitutional: He is oriented to person, place, and time. He appears well-developed and well-nourished.  HENT:  Head: Normocephalic and atraumatic.  Right Ear: External ear normal.  Left Ear: External ear normal.  Nose: Nose normal.  Mouth/Throat: Oropharynx is clear and moist.  TMs and canals are clear.   Eyes: Pupils are equal, round, and reactive to light. Conjunctivae and EOM are normal.  Neck: No thyromegaly present.  Cardiovascular: Normal rate, regular rhythm and normal heart sounds.  Pulmonary/Chest: Effort normal and breath sounds normal.  Musculoskeletal:     Cervical back: Neck supple.     Comments: No swelling  or erythema around the left ankle.    Lymphadenopathy:    He has no cervical adenopathy.  Neurological: He is alert and oriented to person, place, and time.  No nystagmus on Dix-Hallpike maneuver but he says it started to recreate his his symptoms on the right side.  Skin: Skin is warm and dry.  Psychiatric: He has a normal mood and affect. His behavior is normal.    BP (!) 150/62   Pulse 76   Ht 5\' 9"  (1.753 m)   Wt 210 lb (95.3 kg)   SpO2 96%   BMI 31.01 kg/m  Wt Readings from Last 3 Encounters:  11/16/19 210 lb (95.3 kg)   11/02/19 209 lb (94.8 kg)  10/06/19 210 lb (95.3 kg)     There are no preventive care reminders to display for this patient.  There are no preventive care reminders to display for this patient.  Lab Results  Component Value Date   TSH 2.02 01/05/2019   Lab Results  Component Value Date   WBC 7.9 11/16/2019   HGB 13.1 (L) 11/16/2019   HCT 37.8 (L) 11/16/2019   MCV 88.1 11/16/2019   PLT 260 11/16/2019   Lab Results  Component Value Date   NA 140 11/16/2019   K 4.8 11/16/2019   CO2 24 11/16/2019   GLUCOSE 97 11/16/2019   BUN 15 11/16/2019   CREATININE 1.30 (H) 11/16/2019   BILITOT 0.4 09/28/2019   ALKPHOS 69 09/28/2019   AST 18 09/28/2019   ALT 15 09/28/2019   PROT 7.1 09/28/2019   ALBUMIN 4.5 09/28/2019   CALCIUM 9.2 11/16/2019   ANIONGAP 11 06/23/2019   GFR 59.22 (L) 03/06/2012   Lab Results  Component Value Date   CHOL 122 09/28/2019   Lab Results  Component Value Date   HDL 50 09/28/2019   Lab Results  Component Value Date   LDLCALC 52 09/28/2019   Lab Results  Component Value Date   TRIG 111 09/28/2019   Lab Results  Component Value Date   CHOLHDL 2.4 09/28/2019   Lab Results  Component Value Date   HGBA1C 6.7 (A) 11/16/2019      Assessment & Plan:   Problem List Items Addressed This Visit      Cardiovascular and Mediastinum   Essential hypertension    Pressure not well controlled off of the amlodipine.  Encouraged him that I really do not think that the medication has anything to do with the dizziness I really think it is consistent with benign positional vertigo.  Okay to restart the amlodipine at 10 mg develops any new concerns or symptoms after restarting the medication and please let us know immediately.      Relevant Orders   BASIC METABOLIC PANEL WITH GFR (Completed)   CBC (Completed)   CK (Creatine Kinase) (Completed)   D-dimer, quantitative (not at [facility]) (Completed)   DM (diabetes mellitus) with peripheral vascular  complication (HCC) - Primary    A1c at goal today.  Continue current regimen.  Follow-up in 3 months.  Lab Results  Component Value Date   HGBA1C 6.7 (A) 11/16/2019         Relevant Orders   POCT glycosylated hemoglobin (Hb A1C) (Completed)   BASIC METABOLIC PANEL WITH GFR (Completed)   CBC (Completed)   CK (Creatine Kinase) (Completed)   D-dimer, quantitative (not at [facility]) (Completed)     Other   Dizziness   Relevant Orders   MR Brain W Wo Contrast  Other Visit Diagnoses    Leg cramping       Relevant Orders   BASIC METABOLIC PANEL WITH GFR (Completed)   CBC (Completed)   CK (Creatine Kinase) (Completed)   D-dimer, quantitative (not at [facility]) (Completed)   Left leg pain       Relevant Orders   BASIC METABOLIC PANEL WITH GFR (Completed)   CBC (Completed)   CK (Creatine Kinase) (Completed)   D-dimer, quantitative (not at [facility]) (Completed)   US Venous Img Lower Unilateral Left   Benign paroxysmal positional vertigo, unspecified laterality       Relevant Orders   MR Brain W Wo Contrast   Elevated d-dimer       Relevant Orders   US Venous Img Lower Unilateral Left     BPPV-suspect probably right sided so given handout with information on how to do home exercises also encouraged his daughter to help him may be even find a YouTube video to demonstrate them we also discussed vestibular rehab for more formal treatment but he wanted to try the home exercises first which I think is reasonable but if is not improving in 1 to 2 weeks at the most then I think he should switch to vestibular rehab.  We also discussed evaluating for stroke especially because of the persistence of the dizziness as it is never lasted this long before.  He has tried meclizine in the past and says it really was not helpful.  Left leg pain and cramping-we will evaluate for elevated CK.  He also says sometimes at night his ankle on that leg really hurts to the point where he says it almost feels broken.  We  will also check D-dimer if it is elevated then will get Dopplers to rule out DVT.  Again he has not been moving that leg as much as he probably should be check for electrolyte abnormality.  Did encourage him to make sure that he is drinking plenty of water and hydrating well and to avoid going barefoot around the house and wear good support with his feet.  Also discussed a trial of B6 30 mg daily if needed.  No orders of the defined types were placed in this encounter.  Time spent 40 minutes in encounter.  Follow-up: Return in about 2 weeks (around 11/30/2019).    [name], MD

## 2019-11-16 NOTE — Assessment & Plan Note (Signed)
Pressure not well controlled off of the amlodipine.  Encouraged him that I really do not think that the medication has anything to do with the dizziness I really think it is consistent with benign positional vertigo.  Okay to restart the amlodipine at 10 mg develops any new concerns or symptoms after restarting the medication and please let us know immediately.

## 2019-11-16 NOTE — Assessment & Plan Note (Signed)
A1c at goal today.  Continue current regimen.  Follow-up in 3 months.  Lab Results  Component Value Date   HGBA1C 6.7 (A) 11/16/2019

## 2019-11-16 NOTE — Patient Instructions (Signed)
Please restart your amlodipine.

## 2019-11-16 NOTE — Telephone Encounter (Signed)
Jenny Reichmann called back and left another message wanting to talk about his blood pressure medication.

## 2019-11-17 LAB — BASIC METABOLIC PANEL WITH GFR
BUN/Creatinine Ratio: 12 (calc) (ref 6–22)
BUN: 15 mg/dL (ref 7–25)
CO2: 24 mmol/L (ref 20–32)
Calcium: 9.2 mg/dL (ref 8.6–10.3)
Chloride: 103 mmol/L (ref 98–110)
Creat: 1.3 mg/dL — ABNORMAL HIGH (ref 0.70–1.11)
GFR, Est African American: 57 mL/min/{1.73_m2} — ABNORMAL LOW (ref >=60)
GFR, Est Non African American: 49 mL/min/{1.73_m2} — ABNORMAL LOW (ref >=60)
Glucose, Bld: 97 mg/dL (ref 65–99)
Potassium: 4.8 mmol/L (ref 3.5–5.3)
Sodium: 140 mmol/L (ref 135–146)

## 2019-11-17 LAB — CBC
HCT: 37.8 % — ABNORMAL LOW (ref 38.5–50.0)
Hemoglobin: 13.1 g/dL — ABNORMAL LOW (ref 13.2–17.1)
MCH: 30.5 pg (ref 27.0–33.0)
MCHC: 34.7 g/dL (ref 32.0–36.0)
MCV: 88.1 fL (ref 80.0–100.0)
MPV: 10.6 fL (ref 7.5–12.5)
Platelets: 260 10*3/uL (ref 140–400)
RBC: 4.29 10*6/uL (ref 4.20–5.80)
RDW: 13.2 % (ref 11.0–15.0)
WBC: 7.9 10*3/uL (ref 3.8–10.8)

## 2019-11-17 LAB — D-DIMER, QUANTITATIVE: D-Dimer, Quant: 0.5 mcg/mL FEU — ABNORMAL HIGH (ref <0.50)

## 2019-11-17 LAB — CK: Total CK: 174 U/L (ref 44–196)

## 2019-11-19 ENCOUNTER — Other Ambulatory Visit: Payer: Self-pay | Admitting: *Deleted

## 2019-11-19 ENCOUNTER — Telehealth: Payer: Self-pay

## 2019-11-19 ENCOUNTER — Other Ambulatory Visit: Payer: Self-pay

## 2019-11-19 ENCOUNTER — Ambulatory Visit (HOSPITAL_COMMUNITY)
Admission: RE | Admit: 2019-11-19 | Discharge: 2019-11-19 | Disposition: A | Discharge status: Home / Self Care | Payer: Medicare Other | Source: Ambulatory Visit | Attending: Cardiovascular Disease | Admitting: Cardiovascular Disease

## 2019-11-19 DIAGNOSIS — I739 Peripheral vascular disease, unspecified: Secondary | ICD-10-CM

## 2019-11-19 DIAGNOSIS — M79605 Pain in left leg: Secondary | ICD-10-CM

## 2019-11-19 NOTE — Telephone Encounter (Signed)
Orders placed. [place] placed STAT

## 2019-11-19 NOTE — Telephone Encounter (Signed)
  Clayton Harrison called and states they would like to move forward with the ultra sound and MRI.

## 2019-11-19 NOTE — Addendum Note (Signed)
Addended by: [name] D on: 11/19/2019 09:35 AM   Modules accepted: Orders

## 2019-11-19 NOTE — Telephone Encounter (Signed)
Left message advising of recommendations.  

## 2019-11-19 NOTE — Telephone Encounter (Signed)
Spoke to pt to advised Dr. Fletcher Anon would like him to report to the office today at 4 pm to have an ultrasound. Pt agreeable with plan.

## 2019-11-25 ENCOUNTER — Other Ambulatory Visit: Payer: Self-pay | Admitting: Family Medicine

## 2019-11-25 DIAGNOSIS — E1151 Type 2 diabetes mellitus with diabetic peripheral angiopathy without gangrene: Secondary | ICD-10-CM

## 2019-11-29 ENCOUNTER — Other Ambulatory Visit: Payer: Self-pay

## 2019-11-29 ENCOUNTER — Ambulatory Visit (INDEPENDENT_AMBULATORY_CARE_PROVIDER_SITE_OTHER): Payer: Medicare Other

## 2019-11-29 DIAGNOSIS — R42 Dizziness and giddiness: Secondary | ICD-10-CM

## 2019-11-29 DIAGNOSIS — H8110 Benign paroxysmal vertigo, unspecified ear: Secondary | ICD-10-CM | POA: Diagnosis not present

## 2019-11-29 MED ORDER — AMOXICILLIN-POT CLAVULANATE 875-125 MG PO TABS: 1.0000 | ORAL_TABLET | Freq: Two times a day (BID) | ORAL | 0 refills | Status: DC

## 2019-11-29 MED ORDER — GADOBUTROL 1 MMOL/ML IV SOLN
9.5000 mL | Freq: Once | INTRAVENOUS | Status: AC | PRN
  Administered 2019-11-29: 9.5 mL via INTRAVENOUS

## 2019-11-30 ENCOUNTER — Encounter: Payer: Self-pay | Admitting: Family Medicine

## 2019-11-30 ENCOUNTER — Ambulatory Visit (INDEPENDENT_AMBULATORY_CARE_PROVIDER_SITE_OTHER): Payer: Medicare Other | Admitting: Family Medicine

## 2019-11-30 VITALS — BP 130/58 | HR 66 | Ht 69.0 in | Wt 209.0 lb

## 2019-11-30 DIAGNOSIS — R296 Repeated falls: Secondary | ICD-10-CM

## 2019-11-30 DIAGNOSIS — R519 Headache, unspecified: Secondary | ICD-10-CM | POA: Diagnosis not present

## 2019-11-30 DIAGNOSIS — R2681 Unsteadiness on feet: Secondary | ICD-10-CM

## 2019-11-30 DIAGNOSIS — H8110 Benign paroxysmal vertigo, unspecified ear: Secondary | ICD-10-CM | POA: Diagnosis not present

## 2019-11-30 NOTE — Progress Notes (Signed)
Established Patient Office Visit  Subjective:  Patient ID: Clayton Harrison, male    DOB: Sep 29, 1930  Age: 86 y.o. MRN: [id number]  CC:  Chief Complaint  Patient presents with  . Follow-up    HPI Clayton Harrison presents for dizziness.  He still feeling dizzy overall but says he actually feels a little better today and says yesterday actually was not that bad.  He has been having some headaches on the posterior side of his left head.  Evidently since he last saw me he rolled out of bed in his sleep and landed in the floor.  He said he actually cut his scalp but it healed pretty quickly and he hit his right hip.  He has had a couple falls recently as well.  Past Medical History:  Diagnosis Date  . Adenomatous colon polyp    2.5cm excised by right colectomy 2011  . Anemia, blood loss    history of  . Blockage of coronary artery of heart ([place]) 2001   x 4  . CAD (coronary artery disease)   . Carotid artery occlusion   . Cataract   . Cerebrovascular disease, unspecified   . Colon tumor 03/2010   right ascending  . Diabetic retinopathy ([place]) 09/29/2017  . DM (diabetes mellitus) ([place])   . Elevated PSA   . GERD (gastroesophageal reflux disease)   . Hearing loss   . HTN (hypertension)   . Hypercholesteremia   . Interstitial cystitis   . Macular degeneration   . Other and unspecified hyperlipidemia   . Prostate cancer ([place]) 03/09/14   Gleason 4+5=9, volume 98 mL  . PVD (peripheral vascular disease) ([place])   . S/P radiation therapy 07/20/2014 through 09/13/2014    Prostate 7800 cGy in 40 sessions, seminal vesicles 5600 cGy in 40 sessions   . SBO (small bowel obstruction) ([place]) 07/2013  . Skin cancer 2012   melanoma upper back, removed  . Statin intolerance   . Vertigo   . Vertigo     Past Surgical History:  Procedure Laterality Date  . ABDOMINAL AORTOGRAM W/LOWER EXTREMITY N/A 10/06/2019   Procedure: ABDOMINAL  AORTOGRAM W/LOWER EXTREMITY;  Surgeon: Wellington Hampshire, MD;  Location: [facility];  Service: Cardiovascular;  Laterality: N/A;  . APPENDECTOMY  05/2010   with right hemicolectomy  . CARDIAC CATHETERIZATION  11/1999  . CAROTID ENDARTERECTOMY  2012   right  . CORONARY ARTERY BYPASS GRAFT  2001   4  . dental extractions Bilateral 1980  . DENTAL SURGERY    . [place]  . left carpal tunnel release  2009  . PARTIAL COLECTOMY  05/24/2010   Lap converted to open right proximal colectomy  . PERIPHERAL VASCULAR INTERVENTION  10/06/2019   Procedure: PERIPHERAL VASCULAR INTERVENTION;  Surgeon: Wellington Hampshire, MD;  Location: [facility];  Service: Cardiovascular;;  left lower extremity  . PROSTATE BIOPSY  03/09/14   Gleason 9, vol 98 mL  . SKIN CANCER EXCISION  02/2011   upper back  . TONSILLECTOMY  1940  . URETEROLITHOTOMY      Family History  Problem Relation Age of Onset  . Diabetes Mother   . Heart disease Mother   . Heart failure Mother   . Hypertension Mother   . Heart disease Father   . Cancer Cousin        colon  . Colon cancer Maternal Uncle        x4  .  Cancer Maternal Uncle        colon  . Cancer Daughter        breast  . Cancer Daughter        breast    Social History   Socioeconomic History  . Marital status: Married    Spouse name: Clayton Harrison  . Number of children: 2  . Years of education: HS  . Highest education level: Not on file  Occupational History  . Occupation: Retired  Tobacco Use  . Smoking status: Former Smoker    Packs/day: 2.00    Years: 40.00    Pack years: 80.00    Quit date: 11/23/1999    Years since quitting: 20.0  . Smokeless tobacco: Current User    Types: Chew  Substance and Sexual Activity  . Alcohol use: No  . Drug use: No  . Sexual activity: Yes    Partners: Female  Other Topics Concern  . Not on file  Social History Narrative   No regular exercise. Daily caffeine- 3-4 cups daily         Social  Determinants of Health   Financial Resource Strain:   . Difficulty of Paying Living Expenses:   Food Insecurity:   . Worried About Charity fundraiser in the Last Year:   . Arboriculturist in the Last Year:   Transportation Needs:   . Film/video editor (Medical):   Marland Kitchen Lack of Transportation (Non-Medical):   Physical Activity:   . Days of Exercise per Week:   . Minutes of Exercise per Session:   Stress:   . Feeling of Stress :   Social Connections:   . Frequency of Communication with Friends and Family:   . Frequency of Social Gatherings with Friends and Family:   . Attends Religious Services:   . Active Member of Clubs or Organizations:   . Attends Archivist Meetings:   Marland Kitchen Marital Status:   Intimate Partner Violence:   . Fear of Current or Ex-Partner:   . Emotionally Abused:   Marland Kitchen Physically Abused:   . Sexually Abused:     Outpatient Medications Prior to Visit  Medication Sig Dispense Refill  . Albuterol Sulfate (PROAIR RESPICLICK) 646 (90 Base) MCG/ACT AEPB Inhale 2 puffs into the lungs every 6 (six) hours as needed. 3 each 4  . amLODipine (NORVASC) 5 MG tablet Take 5 mg by mouth daily.    Marland Kitchen amoxicillin-clavulanate (AUGMENTIN) 875-125 MG tablet Take 1 tablet by mouth 2 (two) times daily. 28 tablet 0  . aspirin EC 81 MG tablet Take 1 tablet (81 mg total) by mouth daily. 90 tablet 3  . clopidogrel (PLAVIX) 75 MG tablet Take 1 tablet (75 mg total) by mouth daily. 90 tablet 3  . DULoxetine (CYMBALTA) 60 MG capsule TAKE 1 CAPSULE BY MOUTH  DAILY (Patient taking differently: Take 60 mg by mouth daily. ) 90 capsule 3  . glipiZIDE (GLUCOTROL) 10 MG tablet TAKE 1 TABLET BY MOUTH  TWICE A DAY BEFORE MEALS (Patient taking differently: Take 10 mg by mouth 2 (two) times daily before a meal. ) 180 tablet 3  . Insulin Pen Needle (BD PEN NEEDLE NANO U/F) 32G X 4 MM MISC INJECT SUBCUTANEOUSLY DAILY 90 each 3  . lisinopril (ZESTRIL) 20 MG tablet TAKE 1 TABLET BY MOUTH AT  BEDTIME  (Patient taking differently: Take 20 mg by mouth daily. ) 90 tablet 3  . metFORMIN (GLUCOPHAGE) 1000 MG tablet TAKE 1 TABLET BY MOUTH  TWICE DAILY WITH MEALS (Patient taking differently: Take 1,000 mg by mouth 2 (two) times daily with a meal. ) 180 tablet 3  . metoprolol tartrate (LOPRESSOR) 50 MG tablet Take 1 tablet (50 mg total) by mouth 2 (two) times daily. 180 tablet 3  . Multiple Vitamins-Minerals (PRESERVISION AREDS 2+MULTI VIT) CAPS Take 1 capsule by mouth 2 (two) times daily at 10 AM and 5 PM.    . rosuvastatin (CRESTOR) 10 MG tablet Take 1 tablet (10 mg total) by mouth daily. 90 tablet 3  . TOUJEO SOLOSTAR 300 UNIT/ML Solostar Pen INJECT 38 UNITS INTO THE  SKIN AT BEDTIME 13.5 mL 3  . umeclidinium bromide (INCRUSE ELLIPTA) 62.5 MCG/INH AEPB Inhale 1 puff into the lungs daily. 90 each 3  . meclizine (ANTIVERT) 25 MG tablet TAKE ONE-HALF TABLET BY  MOUTH TWO TIMES DAILY AS  NEEDED FOR DIZZINESS 90 tablet 1   No facility-administered medications prior to visit.    Allergies  Allergen Reactions  . Shellfish Allergy Anaphylaxis    Swelling of the throat  . Statins Other (See Comments)    Myalgias, urinary frequency  . Atorvastatin Other (See Comments)    urinary frequency, palpitations  . Ezetimibe Other (See Comments)    "hip problems"  . Flomax [Tamsulosin Hcl] Other (See Comments)    Complains of dizziness  . Prednisone Other (See Comments)    Crazy dreams, insomnia  . Tizanidine Other (See Comments)    Insomnia, nightmares    ROS Review of Systems    Objective:    Physical Exam  BP (!) 130/58   Pulse 66   Ht 5\' 9"  (1.753 m)   Wt 209 lb (94.8 kg)   SpO2 96%   BMI 30.86 kg/m  Wt Readings from Last 3 Encounters:  11/30/19 209 lb (94.8 kg)  11/16/19 210 lb (95.3 kg)  11/02/19 209 lb (94.8 kg)     There are no preventive care reminders to display for this patient.  There are no preventive care reminders to display for this patient.  Lab Results  Component  Value Date   TSH 2.02 01/05/2019   Lab Results  Component Value Date   WBC 7.9 11/16/2019   HGB 13.1 (L) 11/16/2019   HCT 37.8 (L) 11/16/2019   MCV 88.1 11/16/2019   PLT 260 11/16/2019   Lab Results  Component Value Date   NA 140 11/16/2019   K 4.8 11/16/2019   CO2 24 11/16/2019   GLUCOSE 97 11/16/2019   BUN 15 11/16/2019   CREATININE 1.30 (H) 11/16/2019   BILITOT 0.4 09/28/2019   ALKPHOS 69 09/28/2019   AST 18 09/28/2019   ALT 15 09/28/2019   PROT 7.1 09/28/2019   ALBUMIN 4.5 09/28/2019   CALCIUM 9.2 11/16/2019   ANIONGAP 11 06/23/2019   GFR 59.22 (L) 03/06/2012   Lab Results  Component Value Date   CHOL 122 09/28/2019   Lab Results  Component Value Date   HDL 50 09/28/2019   Lab Results  Component Value Date   LDLCALC 52 09/28/2019   Lab Results  Component Value Date   TRIG 111 09/28/2019   Lab Results  Component Value Date   CHOLHDL 2.4 09/28/2019   Lab Results  Component Value Date   HGBA1C 6.7 (A) 11/16/2019      Assessment & Plan:   Problem List Items Addressed This Visit      Other   Gait instability   Relevant Orders   Ambulatory referral to Physical  Therapy    Other Visit Diagnoses    Benign paroxysmal positional vertigo, unspecified laterality    -  Primary   Relevant Orders   Ambulatory referral to Physical Therapy   Frequent falls       Relevant Orders   Ambulatory referral to Physical Therapy   Acute nonintractable headache, unspecified headache type          Gait instability-we will refer for physical therapy.  BPPV-recommend formal physical therapy has been tried to do the home exercises and has noticed some mild improvement but symptoms not completely resolved yet.  We will wait and place referral closer to [facility] per his preference.  Need to make sure that there is someone there trained in vestibular rehab.  Frequent falls-again refer to formal physical therapy.  He might even do well with a possible home assessment  for falls but again we will start with physical therapy to see if this is helpful he has been using a cane.  Headache-he has been having some headaches on the posterior left side of his head.  Recent MRI was normal but did reveal a maxillary sinusitis on the right.  Right maxillary sinusitis-did encourage her to pick up the antibiotics to go ahead and get started and hopeful that getting this treated will actually possibly improve some of his dizziness that he has been experiencing.  Not sure if related to the head ache since it is on the opposite side of the head.  He did request that we send a copy of the MRI to Dr. Stanford Breed to take a look at.  He was concerned about the chronic small vessel ischemic disease peer we discussed that this is not uncommon in someone his age and that is just important that he takes his cholesterol pill and controls his blood pressure and continues to work on healthy diet and staying active and maintaining a healthy weight.  No orders of the defined types were placed in this encounter.   Follow-up: Return in about 3 months (around 03/01/2020).    [name], MD

## 2019-12-09 ENCOUNTER — Other Ambulatory Visit: Payer: Self-pay | Admitting: Family Medicine

## 2019-12-09 ENCOUNTER — Encounter (HOSPITAL_COMMUNITY): Payer: Medicare Other

## 2019-12-12 ENCOUNTER — Encounter: Payer: Self-pay | Admitting: Family Medicine

## 2019-12-13 MED ORDER — PRESERVISION AREDS 2+MULTI VIT PO CAPS: 1.0000 | ORAL_CAPSULE | Freq: Two times a day (BID) | ORAL | Status: AC

## 2019-12-13 NOTE — Telephone Encounter (Signed)
Note to PCP.   Multivitamin already on med list, I added in sig "contains 6 mg of B 6"

## 2019-12-17 ENCOUNTER — Ambulatory Visit: Payer: Medicare Other | Admitting: Family Medicine

## 2019-12-30 ENCOUNTER — Encounter (INDEPENDENT_AMBULATORY_CARE_PROVIDER_SITE_OTHER): Payer: Medicare Other | Admitting: Ophthalmology

## 2019-12-30 ENCOUNTER — Other Ambulatory Visit: Payer: Self-pay

## 2019-12-30 DIAGNOSIS — H353211 Exudative age-related macular degeneration, right eye, with active choroidal neovascularization: Secondary | ICD-10-CM | POA: Diagnosis not present

## 2019-12-30 DIAGNOSIS — H35033 Hypertensive retinopathy, bilateral: Secondary | ICD-10-CM | POA: Diagnosis not present

## 2019-12-30 DIAGNOSIS — I10 Essential (primary) hypertension: Secondary | ICD-10-CM | POA: Diagnosis not present

## 2019-12-30 DIAGNOSIS — H353122 Nonexudative age-related macular degeneration, left eye, intermediate dry stage: Secondary | ICD-10-CM | POA: Diagnosis not present

## 2019-12-30 DIAGNOSIS — E113393 Type 2 diabetes mellitus with moderate nonproliferative diabetic retinopathy without macular edema, bilateral: Secondary | ICD-10-CM

## 2019-12-30 DIAGNOSIS — E11319 Type 2 diabetes mellitus with unspecified diabetic retinopathy without macular edema: Secondary | ICD-10-CM

## 2019-12-30 DIAGNOSIS — H43813 Vitreous degeneration, bilateral: Secondary | ICD-10-CM

## 2019-12-30 LAB — HM DIABETES EYE EXAM

## 2020-01-04 ENCOUNTER — Ambulatory Visit (INDEPENDENT_AMBULATORY_CARE_PROVIDER_SITE_OTHER): Payer: Medicare Other | Admitting: Physician Assistant

## 2020-01-04 ENCOUNTER — Other Ambulatory Visit: Payer: Self-pay

## 2020-01-04 VITALS — Ht 69.0 in | Wt 210.0 lb

## 2020-01-04 DIAGNOSIS — R42 Dizziness and giddiness: Secondary | ICD-10-CM | POA: Diagnosis not present

## 2020-01-04 DIAGNOSIS — I739 Peripheral vascular disease, unspecified: Secondary | ICD-10-CM

## 2020-01-04 DIAGNOSIS — I951 Orthostatic hypotension: Secondary | ICD-10-CM

## 2020-01-04 DIAGNOSIS — I10 Essential (primary) hypertension: Secondary | ICD-10-CM

## 2020-01-04 NOTE — Progress Notes (Signed)
Subjective:    Patient ID: Clayton Harrison, male    DOB: [birth year], 86 y.o.   MRN: [id number]  HPI  Pt is a 86 yo male with hx of BPPV and recent right maxillary sinus infection who presents to the clinic "feeling like he is going back down hill". He is feeling weaker and weaker. He feels ok when he gets up in the morning but as the day goes on he gets dizzy. He endorses spinning dizziness and almost black out dizziness. If he stands up he really gets dizzy. He had PVD intervention in April and since then he has noticed a lot of these symptoms. MRI was done and negative for any acute findings except sinusitis. He was put on augmentin and felt much better like 80 percent. After stopping he feels like he is going back down hill. He is not doing his epley maneuvers.    .. Active Ambulatory Problems    Diagnosis Date Noted  . DM (diabetes mellitus) with peripheral vascular complication ([place]) 58/02/9832  . Hyperlipemia 01/16/2007  . ANEMIA-UNSPECIFIED 02/15/2010  . Essential hypertension 01/16/2007  . Coronary atherosclerosis 01/16/2007  . Cerebrovascular disease 12/10/2008  . Peripheral vascular disease ([place]) 06/02/2009  . GERD 02/15/2010  . INTERSTITIAL CYSTITIS 01/16/2007  . Prostatic adenocarcinoma ([place]) 01/16/2007  . Statin intolerance 03/20/2011  . Carotid artery occlusion without infarction 08/13/2011  . Cataracts, bilateral 08/13/2012  . Macular degeneration 08/13/2012  . Overweight (BMI 25.0-29.9) 08/13/2012  . BPH (benign prostatic hyperplasia) 10/15/2012  . Palpitations 10/27/2012  . H/O right hemicolectomy 08/06/2013  . Metabolic acidosis 82/50/5397  . HOH (hard of hearing) 08/06/2013  . History of small bowel obstruction 08/06/2013  . Chronic kidney disease (CKD) stage G2/A2, mildly decreased glomerular filtration rate (GFR) between 60-89 mL/min/1.73 square meter and albuminuria creatinine ratio between 30-299 mg/g 05/27/2014  . Spinal stenosis of lumbar region 01/17/2015  .  Carpal tunnel syndrome, right 11/16/2015  . Primary osteoarthritis of right wrist 11/16/2015  . Aortic atherosclerosis ([place]) 03/26/2016  . Chronic obstructive pulmonary disease ([place]) 04/17/2017  . Restrictive lung disease 04/17/2017  . Herpes simplex viral infection 06/30/2018  . Sebaceous cyst 06/30/2018  . Dizziness 01/05/2019  . Gait instability 04/08/2019  . Weakness of both lower extremities 08/19/2019   Resolved Ambulatory Problems    Diagnosis Date Noted  . Preop cardiovascular exam 01/08/2011  . Acute kidney injury ([place]) 08/06/2013  . Acute diarrhea 08/06/2013   Past Medical History:  Diagnosis Date  . Adenomatous colon polyp   . Anemia, blood loss   . Blockage of coronary artery of heart ([place]) 2001  . CAD (coronary artery disease)   . Carotid artery occlusion   . Cataract   . Cerebrovascular disease, unspecified   . Colon tumor 03/2010  . Diabetic retinopathy ([place]) 09/29/2017  . DM (diabetes mellitus) ([place])   . Elevated PSA   . GERD (gastroesophageal reflux disease)   . Hearing loss   . HTN (hypertension)   . Hypercholesteremia   . Interstitial cystitis   . Other and unspecified hyperlipidemia   . Prostate cancer ([place]) 03/09/14  . PVD (peripheral vascular disease) ([place])   . S/P radiation therapy 07/20/2014 through 09/13/2014   . SBO (small bowel obstruction) ([place]) 07/2013  . Skin cancer 2012  . Vertigo   . Vertigo     Review of Systems  All other systems reviewed and are negative.      Objective:   Physical Exam Vitals reviewed.  Constitutional:  Appearance: Normal appearance.  HENT:     Head: Normocephalic.     Comments: No sinus tenderness to palpation.     Right Ear: Tympanic membrane normal.     Left Ear: Tympanic membrane normal.     Mouth/Throat:     Mouth: Mucous membranes are moist.  Eyes:     Pupils: Pupils are equal, round, and reactive to light.  Cardiovascular:     Rate and  Rhythm: Normal rate and regular rhythm.     Pulses: Normal pulses.     Heart sounds: Murmur heard.   Pulmonary:     Effort: Pulmonary effort is normal.     Breath sounds: Normal breath sounds. No wheezing or rhonchi.  Musculoskeletal:     Right lower leg: No edema.     Left lower leg: No edema.  Neurological:     General: No focal deficit present.     Mental Status: He is alert and oriented to person, place, and time.  Psychiatric:        Mood and Affect: Mood normal.           Assessment & Plan:  Marland KitchenMarland KitchenBray was seen today for dizziness.  Diagnoses and all orders for this visit:  Orthostatic hypotension -     COMPLETE METABOLIC PANEL WITH GFR -     CBC  Dizziness -     COMPLETE METABOLIC PANEL WITH GFR -     CBC   .Marland Kitchen Orthostatic VS for the past 24 hrs (Last 3 readings):  BP- Lying Pulse- Lying BP- Sitting Pulse- Sitting BP- Standing at 0 minutes Pulse- Standing at 0 minutes  01/04/20 1437 157/65 68 153/62 69 998/33 70    Certainly pt has orthostatic hypotension. I think he could also have some BPPV and they intermittently both be expressed. Most of this new dizziness started after PVD procedure.  Echo done Jan 2021. No concerns. Discussed hydration and MUST wear compression stockings. Will reach out to cardiology and PcP and see if they have any suggestions other than conservative. I don't think a pressor would be a good idea due to baseline BP 150s. We could consider PT for vestibular rehab.   Labs done in may showed GFR decrease. Will recheck CMP today.                                                            I do think it is interesting that he felt 80 percent better on Augmentin. Could be more of a chronic sinusitis going on. Will discuss with PcP about keeping him on augmentin a little longer since MRI did show sinusitis.   Discussed plan with PCP, Dr. Madilyn Fireman. Will treat for chronic sinusitis. Follow up in 2 weeks.   Spent 30 minutes with patient.

## 2020-01-04 NOTE — Patient Instructions (Addendum)
Dr. Stanford Breed and Madilyn Fireman Discuss addition plan orthostatic hypotension.  Will recheck labs.  Wear compression stockings.    Orthostatic Hypotension Blood pressure is a measurement of how strongly, or weakly, your blood is pressing against the walls of your arteries. Orthostatic hypotension is a sudden drop in blood pressure that happens when you quickly change positions, such as when you get up from sitting or lying down. Arteries are blood vessels that carry blood from your heart throughout your body. When blood pressure is too low, you may not get enough blood to your brain or to the rest of your organs. This can cause weakness, light-headedness, rapid heartbeat, and fainting. This can last for just a few seconds or for up to a few minutes. Orthostatic hypotension is usually not a serious problem. However, if it happens frequently or gets worse, it may be a sign of something more serious. What are the causes? This condition may be caused by:  Sudden changes in posture, such as standing up quickly after you have been sitting or lying down.  Blood loss.  Loss of body fluids (dehydration).  Heart problems.  Hormone (endocrine) problems.  Pregnancy.  Severe infection.  Lack of certain nutrients.  Severe allergic reactions (anaphylaxis).  Certain medicines, such as blood pressure medicine or medicines that make the body lose excess fluids (diuretics). Sometimes, this condition can be caused by not taking medicine as directed, such as taking too much of a certain medicine. What increases the risk? The following factors may make you more likely to develop this condition:  Age. Risk increases as you get older.  Conditions that affect the heart or the central nervous system.  Taking certain medicines, such as blood pressure medicine or diuretics.  Being pregnant. What are the signs or symptoms? Symptoms of this condition may  include:  Weakness.  Light-headedness.  Dizziness.  Blurred vision.  Fatigue.  Rapid heartbeat.  Fainting, in severe cases. How is this diagnosed? This condition is diagnosed based on:  Your medical history.  Your symptoms.  Your blood pressure measurement. Your health care provider will check your blood pressure when you are: ? Lying down. ? Sitting. ? Standing. A blood pressure reading is recorded as two numbers, such as "120 over 80" (or 120/80). The first ("top") number is called the systolic pressure. It is a measure of the pressure in your arteries as your heart beats. The second ("bottom") number is called the diastolic pressure. It is a measure of the pressure in your arteries when your heart relaxes between beats. Blood pressure is measured in a unit called mm Hg. Healthy blood pressure for most adults is 120/80. If your blood pressure is below 90/60, you may be diagnosed with hypotension. Other information or tests that may be used to diagnose orthostatic hypotension include:  Your other vital signs, such as your heart rate and temperature.  Blood tests.  Tilt table test. For this test, you will be safely secured to a table that moves you from a lying position to an upright position. Your heart rhythm and blood pressure will be monitored during the test. How is this treated? This condition may be treated by:  Changing your diet. This may involve eating more salt (sodium) or drinking more water.  Taking medicines to raise your blood pressure.  Changing the dosage of certain medicines you are taking that might be lowering your blood pressure.  Wearing compression stockings. These stockings help to prevent blood clots and reduce swelling in your  legs. In some cases, you may need to go to the hospital for:  Fluid replacement. This means you will receive fluids through an IV.  Blood replacement. This means you will receive donated blood through an IV  (transfusion).  Treating an infection or heart problems, if this applies.  Monitoring. You may need to be monitored while medicines that you are taking wear off. Follow these instructions at home: Eating and drinking   Drink enough fluid to keep your urine pale yellow.  Eat a healthy diet, and follow instructions from your health care provider about eating or drinking restrictions. A healthy diet includes: ? Fresh fruits and vegetables. ? Whole grains. ? Lean meats. ? Low-fat dairy products.  Eat extra salt only as directed. Do not add extra salt to your diet unless your health care provider told you to do that.  Eat frequent, small meals.  Avoid standing up suddenly after eating. Medicines  Take over-the-counter and prescription medicines only as told by your health care provider. ? Follow instructions from your health care provider about changing the dosage of your current medicines, if this applies. ? Do not stop or adjust any of your medicines on your own. General instructions   Wear compression stockings as told by your health care provider.  Get up slowly from lying down or sitting positions. This gives your blood pressure a chance to adjust.  Avoid hot showers and excessive heat as directed by your health care provider.  Return to your normal activities as told by your health care provider. Ask your health care provider what activities are safe for you.  Do not use any products that contain nicotine or tobacco, such as cigarettes, e-cigarettes, and chewing tobacco. If you need help quitting, ask your health care provider.  Keep all follow-up visits as told by your health care provider. This is important. Contact a health care provider if you:  Vomit.  Have diarrhea.  Have a fever for more than 2-3 days.  Feel more thirsty than usual.  Feel weak and tired. Get help right away if you:  Have chest pain.  Have a fast or irregular heartbeat.  Develop  numbness in any part of your body.  Cannot move your arms or your legs.  Have trouble speaking.  Become sweaty or feel light-headed.  Faint.  Feel short of breath.  Have trouble staying awake.  Feel confused. Summary  Orthostatic hypotension is a sudden drop in blood pressure that happens when you quickly change positions.  Orthostatic hypotension is usually not a serious problem.  It is diagnosed by having your blood pressure taken lying down, sitting, and then standing.  It may be treated by changing your diet or adjusting your medicines. This information is not intended to replace advice given to you by your health care provider. Make sure you discuss any questions you have with your health care provider. Document Revised: 12/04/2017 Document Reviewed: 12/04/2017 Elsevier Patient Education  [place].

## 2020-01-05 ENCOUNTER — Encounter: Payer: Self-pay | Admitting: Physician Assistant

## 2020-01-05 LAB — COMPLETE METABOLIC PANEL WITH GFR
AG Ratio: 1.8 (calc) (ref 1.0–2.5)
ALT: 23 U/L (ref 9–46)
AST: 26 U/L (ref 10–35)
Albumin: 4.4 g/dL (ref 3.6–5.1)
Alkaline phosphatase (APISO): 55 U/L (ref 35–144)
BUN/Creatinine Ratio: 17 (calc) (ref 6–22)
BUN: 23 mg/dL (ref 7–25)
CO2: 28 mmol/L (ref 20–32)
Calcium: 9.7 mg/dL (ref 8.6–10.3)
Chloride: 102 mmol/L (ref 98–110)
Creat: 1.38 mg/dL — ABNORMAL HIGH (ref 0.70–1.11)
GFR, Est African American: 53 mL/min/{1.73_m2} — ABNORMAL LOW (ref >=60)
GFR, Est Non African American: 46 mL/min/{1.73_m2} — ABNORMAL LOW (ref >=60)
Globulin: 2.4 g/dL (calc) (ref 1.9–3.7)
Glucose, Bld: 94 mg/dL (ref 65–99)
Potassium: 5.5 mmol/L — ABNORMAL HIGH (ref 3.5–5.3)
Sodium: 139 mmol/L (ref 135–146)
Total Bilirubin: 0.4 mg/dL (ref 0.2–1.2)
Total Protein: 6.8 g/dL (ref 6.1–8.1)

## 2020-01-05 LAB — CBC
HCT: 39 % (ref 38.5–50.0)
Hemoglobin: 12.6 g/dL — ABNORMAL LOW (ref 13.2–17.1)
MCH: 28.7 pg (ref 27.0–33.0)
MCHC: 32.3 g/dL (ref 32.0–36.0)
MCV: 88.8 fL (ref 80.0–100.0)
MPV: 11.1 fL (ref 7.5–12.5)
Platelets: 233 10*3/uL (ref 140–400)
RBC: 4.39 10*6/uL (ref 4.20–5.80)
RDW: 13.3 % (ref 11.0–15.0)
WBC: 8.3 10*3/uL (ref 3.8–10.8)

## 2020-01-05 MED ORDER — AMOXICILLIN-POT CLAVULANATE 500-125 MG PO TABS
1.0000 | ORAL_TABLET | Freq: Two times a day (BID) | ORAL | 0 refills | Status: DC
Start: 2020-01-05 — End: 2020-01-20

## 2020-01-05 NOTE — Progress Notes (Signed)
Dvid,   GFR is down a bit more. Potassium is up a bit as well. Avoid any extra potassium in diet or supplements.   Will treat for chronic sinusitis. Sent abx to pharmacy.   Recheck with PCP in 2 weeks. Recheck potassium in lab next Monday.

## 2020-01-06 ENCOUNTER — Other Ambulatory Visit: Payer: Self-pay | Admitting: Neurology

## 2020-01-06 DIAGNOSIS — E875 Hyperkalemia: Secondary | ICD-10-CM

## 2020-01-09 ENCOUNTER — Encounter: Payer: Self-pay | Admitting: Physician Assistant

## 2020-01-10 ENCOUNTER — Telehealth: Payer: Self-pay

## 2020-01-10 DIAGNOSIS — E875 Hyperkalemia: Secondary | ICD-10-CM | POA: Diagnosis not present

## 2020-01-10 NOTE — Telephone Encounter (Signed)
Pt wife's called requesting feedback from provider. States that pt is taking amoxicillin-calvulanate. She is concerned with pt taking the antibiotic due to his history of elevated potassium levels. Pls advise, thanks.

## 2020-01-10 NOTE — Telephone Encounter (Signed)
That's what he was on for his acute sinusitis and tolerated well. He is not on a lower dose and should tolerate it well. If you would like we can check potassium levels after being on it a week.

## 2020-01-11 LAB — COMPLETE METABOLIC PANEL WITH GFR
AG Ratio: 1.9 (calc) (ref 1.0–2.5)
ALT: 20 U/L (ref 9–46)
AST: 20 U/L (ref 10–35)
Albumin: 4.3 g/dL (ref 3.6–5.1)
Alkaline phosphatase (APISO): 54 U/L (ref 35–144)
BUN/Creatinine Ratio: 14 (calc) (ref 6–22)
BUN: 19 mg/dL (ref 7–25)
CO2: 26 mmol/L (ref 20–32)
Calcium: 9.2 mg/dL (ref 8.6–10.3)
Chloride: 105 mmol/L (ref 98–110)
Creat: 1.39 mg/dL — ABNORMAL HIGH (ref 0.70–1.11)
GFR, Est African American: 53 mL/min/{1.73_m2} — ABNORMAL LOW (ref >=60)
GFR, Est Non African American: 46 mL/min/{1.73_m2} — ABNORMAL LOW (ref >=60)
Globulin: 2.3 g/dL (calc) (ref 1.9–3.7)
Glucose, Bld: 173 mg/dL — ABNORMAL HIGH (ref 65–99)
Potassium: 4.7 mmol/L (ref 3.5–5.3)
Sodium: 141 mmol/L (ref 135–146)
Total Bilirubin: 0.4 mg/dL (ref 0.2–1.2)
Total Protein: 6.6 g/dL (ref 6.1–8.1)

## 2020-01-11 NOTE — Telephone Encounter (Signed)
Patient made aware. They did come for repeat labs yesterday. Will follow up with results when reviewed by MD.

## 2020-01-13 ENCOUNTER — Encounter: Payer: Self-pay | Admitting: Family Medicine

## 2020-01-19 ENCOUNTER — Other Ambulatory Visit: Payer: Self-pay | Admitting: Family Medicine

## 2020-01-20 ENCOUNTER — Ambulatory Visit (INDEPENDENT_AMBULATORY_CARE_PROVIDER_SITE_OTHER): Payer: Medicare Other | Admitting: Family Medicine

## 2020-01-20 ENCOUNTER — Other Ambulatory Visit: Payer: Self-pay

## 2020-01-20 ENCOUNTER — Encounter: Payer: Self-pay | Admitting: Family Medicine

## 2020-01-20 VITALS — BP 132/62 | HR 70 | Ht 69.0 in | Wt 211.0 lb

## 2020-01-20 DIAGNOSIS — J329 Chronic sinusitis, unspecified: Secondary | ICD-10-CM | POA: Diagnosis not present

## 2020-01-20 DIAGNOSIS — R42 Dizziness and giddiness: Secondary | ICD-10-CM | POA: Diagnosis not present

## 2020-01-20 MED ORDER — DOXYCYCLINE HYCLATE 100 MG PO TABS: 100.0000 mg | ORAL_TABLET | Freq: Two times a day (BID) | ORAL | 0 refills | Status: DC

## 2020-01-20 NOTE — Progress Notes (Signed)
Established Patient Office Visit  Subjective:  Patient ID: JW COVIN, male    DOB: 12/02/1930  Age: 86 y.o. MRN: [id number]  CC:  Chief Complaint  Patient presents with  . Dizziness    HPI Clayton Harrison presents for for follow-up on his dizziness.  He is still unfortunately feeling the dizziness.  Initially we had treated him with a short course of antibiotics and he had about an 80% improvement.  So we decided to put him on treatment for chronic sinusitis with Augmentin he is now been on it for a most 3 weeks but has not noticed any significant improvement.  Past Medical History:  Diagnosis Date  . Adenomatous colon polyp    2.5cm excised by right colectomy 2011  . Anemia, blood loss    history of  . Blockage of coronary artery of heart ([place]) 2001   x 4  . CAD (coronary artery disease)   . Carotid artery occlusion   . Cataract   . Cerebrovascular disease, unspecified   . Colon tumor 03/2010   right ascending  . Diabetic retinopathy ([place]) 09/29/2017  . DM (diabetes mellitus) ([place])   . Elevated PSA   . GERD (gastroesophageal reflux disease)   . Hearing loss   . HTN (hypertension)   . Hypercholesteremia   . Interstitial cystitis   . Macular degeneration   . Other and unspecified hyperlipidemia   . Prostate cancer ([place]) 03/09/14   Gleason 4+5=9, volume 98 mL  . PVD (peripheral vascular disease) ([place])   . S/P radiation therapy 07/20/2014 through 09/13/2014    Prostate 7800 cGy in 40 sessions, seminal vesicles 5600 cGy in 40 sessions   . SBO (small bowel obstruction) ([place]) 07/2013  . Skin cancer 2012   melanoma upper back, removed  . Statin intolerance   . Vertigo   . Vertigo     Past Surgical History:  Procedure Laterality Date  . ABDOMINAL AORTOGRAM W/LOWER EXTREMITY N/A 10/06/2019   Procedure: ABDOMINAL AORTOGRAM W/LOWER EXTREMITY;  Surgeon: Wellington Hampshire, MD;  Location: [facility];  Service: Cardiovascular;  Laterality: N/A;  . APPENDECTOMY  05/2010   with right hemicolectomy  . CARDIAC CATHETERIZATION  11/1999  . CAROTID ENDARTERECTOMY  2012   right  . CORONARY ARTERY BYPASS GRAFT  2001   4  . dental extractions Bilateral 1980  . DENTAL SURGERY    . [place]  . left carpal tunnel release  2009  . PARTIAL COLECTOMY  05/24/2010   Lap converted to open right proximal colectomy  . PERIPHERAL VASCULAR INTERVENTION  10/06/2019   Procedure: PERIPHERAL VASCULAR INTERVENTION;  Surgeon: Wellington Hampshire, MD;  Location: [facility];  Service: Cardiovascular;;  left lower extremity  . PROSTATE BIOPSY  03/09/14   Gleason 9, vol 98 mL  . SKIN CANCER EXCISION  02/2011   upper back  . TONSILLECTOMY  1940  . URETEROLITHOTOMY      Family History  Problem Relation Age of Onset  . Diabetes Mother   . Heart disease Mother   . Heart failure Mother   . Hypertension Mother   . Heart disease Father   . Cancer Cousin        colon  . Colon cancer Maternal Uncle        x4  . Cancer Maternal Uncle        colon  . Cancer Daughter        breast  .  Cancer Daughter        breast    Social History   Socioeconomic History  . Marital status: Married    Spouse name: [name]  . Number of children: 2  . Years of education: HS  . Highest education level: Not on file  Occupational History  . Occupation: Retired  Tobacco Use  . Smoking status: Former Smoker    Packs/day: 2.00    Years: 40.00    Pack years: 80.00    Quit date: 11/23/1999    Years since quitting: 20.1  . Smokeless tobacco: Current User    Types: Chew  Substance and Sexual Activity  . Alcohol use: No  . Drug use: No  . Sexual activity: Yes    Partners: Female  Other Topics Concern  . Not on file  Social History Narrative   No regular exercise. Daily caffeine- 3-4 cups daily         Social Determinants of Health   Financial Resource Strain:   . Difficulty of Paying Living  Expenses:   Food Insecurity:   . Worried About Charity fundraiser in the Last Year:   . Arboriculturist in the Last Year:   Transportation Needs:   . Film/video editor (Medical):   Marland Kitchen Lack of Transportation (Non-Medical):   Physical Activity:   . Days of Exercise per Week:   . Minutes of Exercise per Session:   Stress:   . Feeling of Stress :   Social Connections:   . Frequency of Communication with Friends and Family:   . Frequency of Social Gatherings with Friends and Family:   . Attends Religious Services:   . Active Member of Clubs or Organizations:   . Attends Archivist Meetings:   Marland Kitchen Marital Status:   Intimate Partner Violence:   . Fear of Current or Ex-Partner:   . Emotionally Abused:   Marland Kitchen Physically Abused:   . Sexually Abused:     Outpatient Medications Prior to Visit  Medication Sig Dispense Refill  . Albuterol Sulfate (PROAIR RESPICLICK) 891 (90 Base) MCG/ACT AEPB Inhale 2 puffs into the lungs every 6 (six) hours as needed. 3 each 4  . aspirin EC 81 MG tablet Take 1 tablet (81 mg total) by mouth daily. 90 tablet 3  . clopidogrel (PLAVIX) 75 MG tablet Take 1 tablet (75 mg total) by mouth daily. 90 tablet 3  . DULoxetine (CYMBALTA) 60 MG capsule TAKE 1 CAPSULE BY MOUTH  DAILY (Patient taking differently: Take 60 mg by mouth daily. ) 90 capsule 3  . glipiZIDE (GLUCOTROL) 10 MG tablet TAKE 1 TABLET BY MOUTH  TWICE A DAY BEFORE MEALS (Patient taking differently: Take 10 mg by mouth 2 (two) times daily before a meal. ) 180 tablet 3  . Insulin Pen Needle (BD PEN NEEDLE NANO U/F) 32G X 4 MM MISC INJECT SUBCUTANEOUSLY DAILY 90 each 3  . lisinopril (ZESTRIL) 20 MG tablet TAKE 1 TABLET BY MOUTH AT  BEDTIME (Patient taking differently: Take 20 mg by mouth daily. ) 90 tablet 3  . metFORMIN (GLUCOPHAGE) 1000 MG tablet TAKE 1 TABLET BY MOUTH  TWICE DAILY WITH MEALS (Patient taking differently: Take 1,000 mg by mouth 2 (two) times daily with a meal. ) 180 tablet 3  .  metoprolol tartrate (LOPRESSOR) 50 MG tablet TAKE 1 TABLET BY MOUTH  TWICE DAILY 180 tablet 3  . Multiple Vitamins-Minerals (MULTIVITAMIN WITH MINERALS) tablet Take 1 tablet by mouth daily.    Marland Kitchen  Multiple Vitamins-Minerals (PRESERVISION AREDS 2+MULTI VIT) CAPS Take 1 capsule by mouth 2 (two) times daily at 10 AM and 5 PM. **Contains 6 mg of Vitamin B 6**    . pyridOXINE (VITAMIN B-6) 25 MG tablet Take 25 mg by mouth daily.    . rosuvastatin (CRESTOR) 10 MG tablet Take 1 tablet (10 mg total) by mouth daily. 90 tablet 3  . TOUJEO SOLOSTAR 300 UNIT/ML Solostar Pen INJECT 38 UNITS INTO THE  SKIN AT BEDTIME 13.5 mL 3  . umeclidinium bromide (INCRUSE ELLIPTA) 62.5 MCG/INH AEPB Inhale 1 puff into the lungs daily. 90 each 3  . amLODipine (NORVASC) 5 MG tablet Take 5 mg by mouth daily.    Marland Kitchen amoxicillin-clavulanate (AUGMENTIN) 500-125 MG tablet Take 1 tablet (500 mg total) by mouth in the morning and at bedtime. For 4 weeks for chronic sinusitis. 60 tablet 0   No facility-administered medications prior to visit.    Allergies  Allergen Reactions  . Shellfish Allergy Anaphylaxis    Swelling of the throat  . Statins Other (See Comments)    Myalgias, urinary frequency  . Atorvastatin Other (See Comments)    urinary frequency, palpitations  . Ezetimibe Other (See Comments)    "hip problems"  . Flomax [Tamsulosin Hcl] Other (See Comments)    Complains of dizziness  . Prednisone Other (See Comments)    Crazy dreams, insomnia  . Tizanidine Other (See Comments)    Insomnia, nightmares    ROS Review of Systems    Objective:    Physical Exam Constitutional:      Appearance: He is well-developed.  HENT:     Head: Normocephalic and atraumatic.  Cardiovascular:     Rate and Rhythm: Normal rate and regular rhythm.     Heart sounds: Normal heart sounds.  Pulmonary:     Effort: Pulmonary effort is normal.     Breath sounds: Normal breath sounds.  Skin:    General: Skin is warm and dry.   Neurological:     Mental Status: He is alert and oriented to person, place, and time.  Psychiatric:        Behavior: Behavior normal.     BP (!) 132/62   Pulse 70   Ht 5\' 9"  (1.753 m)   Wt (!) 211 lb (95.7 kg)   SpO2 95%   BMI 31.16 kg/m  Wt Readings from Last 3 Encounters:  01/20/20 (!) 211 lb (95.7 kg)  01/04/20 210 lb (95.3 kg)  11/30/19 209 lb (94.8 kg)     Health Maintenance Due  Topic Date Due  . INFLUENZA VACCINE  01/23/2020    There are no preventive care reminders to display for this patient.  Lab Results  Component Value Date   TSH 2.02 01/05/2019   Lab Results  Component Value Date   WBC 8.3 01/04/2020   HGB 12.6 (L) 01/04/2020   HCT 39.0 01/04/2020   MCV 88.8 01/04/2020   PLT 233 01/04/2020   Lab Results  Component Value Date   NA 141 01/10/2020   K 4.7 01/10/2020   CO2 26 01/10/2020   GLUCOSE 173 (H) 01/10/2020   BUN 19 01/10/2020   CREATININE 1.39 (H) 01/10/2020   BILITOT 0.4 01/10/2020   ALKPHOS 69 09/28/2019   AST 20 01/10/2020   ALT 20 01/10/2020   PROT 6.6 01/10/2020   ALBUMIN 4.5 09/28/2019   CALCIUM 9.2 01/10/2020   ANIONGAP 11 06/23/2019   GFR 59.22 (L) 03/06/2012   Lab Results  Component  Value Date   CHOL 122 09/28/2019   Lab Results  Component Value Date   HDL 50 09/28/2019   Lab Results  Component Value Date   LDLCALC 52 09/28/2019   Lab Results  Component Value Date   TRIG 111 09/28/2019   Lab Results  Component Value Date   CHOLHDL 2.4 09/28/2019   Lab Results  Component Value Date   HGBA1C 6.7 (A) 11/16/2019      Assessment & Plan:   Problem List Items Addressed This Visit      Other   Dizziness - Primary    At this point also like to go ahead and get him referred to neurology for further work-up and evaluation since this is now been going on for about 3 months.  There may be some element related to the sinusitis but I do not think that this is completely the cause.  MRI in June otherwise  unrevealing.  He had a echo done earlier this year.  He does have known peripheral vascular disease.      Relevant Orders   Ambulatory referral to Physical Therapy   Ambulatory referral to Neurology    Other Visit Diagnoses    Chronic sinusitis, unspecified location       Relevant Medications   doxycycline (VIBRA-TABS) 100 MG tablet      Sinusitis-we will go ahead and put him back on doxycycline which was the initial medication that he started to respond to.  Meds ordered this encounter  Medications  . doxycycline (VIBRA-TABS) 100 MG tablet    Sig: Take 1 tablet (100 mg total) by mouth 2 (two) times daily.    Dispense:  20 tablet    Refill:  0    Follow-up: No follow-ups on file.   Spent 22 minutes in encounter including reviewing notes.  [name], MD

## 2020-01-21 NOTE — Telephone Encounter (Signed)
Pended. Please review and send if OK

## 2020-01-24 ENCOUNTER — Encounter: Payer: Self-pay | Admitting: Family Medicine

## 2020-01-24 MED ORDER — AMLODIPINE BESYLATE 5 MG PO TABS: 5.0000 mg | ORAL_TABLET | Freq: Every day | ORAL | 1 refills | Status: DC

## 2020-01-24 NOTE — Assessment & Plan Note (Addendum)
At this point also like to go ahead and get him referred to neurology for further work-up and evaluation since this is now been going on for about 3 months.  There may be some element related to the sinusitis but I do not think that this is completely the cause.  MRI in June otherwise unrevealing.  He had a echo done earlier this year.  He does have known peripheral vascular disease.

## 2020-02-03 ENCOUNTER — Telehealth: Payer: Self-pay

## 2020-02-03 NOTE — Telephone Encounter (Signed)
Pt called stating he would like to get a sooner appointment for the Neurologist referral. Per pt, he has an appt on 03/29/20. He is delaying his physical therapy because he wants to have his visit with the Neurologist. Pls contact patient with an update. Thanks in advance.

## 2020-02-08 NOTE — Telephone Encounter (Signed)
I called [facility] to see if they had a sooner appointment and they stated they are scheduling new patient appointments 2-3 months out. I called [facility] to see if I could get patient's appointment moved up sooner and had to leave a voicemail. I am waiting on a return call. - CF

## 2020-02-10 NOTE — Telephone Encounter (Signed)
Noted. Thanks for the update.  

## 2020-02-10 NOTE — Telephone Encounter (Signed)
I spoke with Clayton Harrison at [facility] and she stated she was going to send a message to the doctor stating patient was delaying PT until seen by Neurology to see if she can get him worked in sooner for his appointment. If she gets a sooner appointment she is going to call patient. - CF

## 2020-03-10 ENCOUNTER — Other Ambulatory Visit: Payer: Self-pay | Admitting: Family Medicine

## 2020-03-10 DIAGNOSIS — B009 Herpesviral infection, unspecified: Secondary | ICD-10-CM

## 2020-03-15 ENCOUNTER — Encounter (HOSPITAL_COMMUNITY): Payer: Medicare Other

## 2020-03-17 ENCOUNTER — Encounter: Payer: Self-pay | Admitting: Family Medicine

## 2020-03-21 ENCOUNTER — Encounter: Payer: Self-pay | Admitting: Family Medicine

## 2020-03-21 ENCOUNTER — Ambulatory Visit (INDEPENDENT_AMBULATORY_CARE_PROVIDER_SITE_OTHER): Payer: Medicare Other | Admitting: Family Medicine

## 2020-03-21 ENCOUNTER — Other Ambulatory Visit: Payer: Self-pay

## 2020-03-21 ENCOUNTER — Other Ambulatory Visit: Payer: Self-pay | Admitting: Family Medicine

## 2020-03-21 VITALS — BP 144/53 | HR 66 | Ht 69.0 in | Wt 207.0 lb

## 2020-03-21 DIAGNOSIS — I10 Essential (primary) hypertension: Secondary | ICD-10-CM

## 2020-03-21 DIAGNOSIS — R519 Headache, unspecified: Secondary | ICD-10-CM

## 2020-03-21 DIAGNOSIS — E1151 Type 2 diabetes mellitus with diabetic peripheral angiopathy without gangrene: Secondary | ICD-10-CM | POA: Diagnosis not present

## 2020-03-21 DIAGNOSIS — R42 Dizziness and giddiness: Secondary | ICD-10-CM

## 2020-03-21 DIAGNOSIS — Z23 Encounter for immunization: Secondary | ICD-10-CM | POA: Diagnosis not present

## 2020-03-21 DIAGNOSIS — R29898 Other symptoms and signs involving the musculoskeletal system: Secondary | ICD-10-CM

## 2020-03-21 DIAGNOSIS — M542 Cervicalgia: Secondary | ICD-10-CM

## 2020-03-21 LAB — POCT GLYCOSYLATED HEMOGLOBIN (HGB A1C): Hemoglobin A1C: 6.2 % — AB (ref 4.0–5.6)

## 2020-03-21 NOTE — Assessment & Plan Note (Signed)
Well controlled. Continue current regimen. Follow up in  3-4 months.    Lab Results  Component Value Date   HGBA1C 6.2 (A) 03/21/2020

## 2020-03-21 NOTE — Assessment & Plan Note (Signed)
Has new consult with neurology in 2 weeks.  MRI normal except for sinusitis but that was treated.  Echo was OK.  Not improving with home PT for BPPV.  Taking time with position change.

## 2020-03-21 NOTE — Patient Instructions (Signed)
For your leg weakness, after you see the vascular surgeon if they report that everything looks okay then let me know and I'll refer you back to the orthopedist.  Please stop doing the vertigo exercises since they seem to be aggravating your neck.  Okay to try to extra strength Tylenol at bedtime for your headaches as needed and see if this works a little bit better.  If it's not helping then please let me know.

## 2020-03-21 NOTE — Progress Notes (Signed)
Established Patient Office Visit  Subjective:  Patient ID: Clayton Harrison, male    DOB: 06-27-1930  Age: 86 y.o. MRN: [id number]  CC:  Chief Complaint  Patient presents with  . Diabetes  . Dizziness  . Headache    HPI Clayton Harrison presents for   Diabetes - no hypoglycemic events. No wounds or sores that are not healing well. No increased thirst or urination. Checking glucose at home. Taking medications as prescribed without any side effects.  Reports still having dizzy episodes.  Worse with position change. Tries to do the home exercises and thinks might help some but not really sure. The exercises are aggravating his neck and causes headaches in the back of his head.    Still c/o fo leg weakness but he is still very sendetary.  Has PVD and has f/u appt with vascular next month.  For the swelling in his legs he says he actually has been wearing his compression stockings he really has not had any significant pain at night.  He still concerned because he still feels like he has a lot of weakness in his legs even after having surgery for PVD.  He does have a follow-up point with vascular surgery next month.  Past Medical History:  Diagnosis Date  . Adenomatous colon polyp    2.5cm excised by right colectomy 2011  . Anemia, blood loss    history of  . Blockage of coronary artery of heart ([place]) 2001   x 4  . CAD (coronary artery disease)   . Carotid artery occlusion   . Cataract   . Cerebrovascular disease, unspecified   . Colon tumor 03/2010   right ascending  . Diabetic retinopathy ([place]) 09/29/2017  . DM (diabetes mellitus) ([place])   . Elevated PSA   . GERD (gastroesophageal reflux disease)   . Hearing loss   . HTN (hypertension)   . Hypercholesteremia   . Interstitial cystitis   . Macular degeneration   . Other and unspecified hyperlipidemia   . Prostate cancer ([place]) 03/09/14   Gleason 4+5=9, volume 98 mL  . PVD (peripheral vascular disease) ([place])   . S/P radiation  therapy 07/20/2014 through 09/13/2014    Prostate 7800 cGy in 40 sessions, seminal vesicles 5600 cGy in 40 sessions   . SBO (small bowel obstruction) ([place]) 07/2013  . Skin cancer 2012   melanoma upper back, removed  . Statin intolerance   . Vertigo   . Vertigo     Past Surgical History:  Procedure Laterality Date  . ABDOMINAL AORTOGRAM W/LOWER EXTREMITY N/A 10/06/2019   Procedure: ABDOMINAL AORTOGRAM W/LOWER EXTREMITY;  Surgeon: Wellington Hampshire, MD;  Location: [facility];  Service: Cardiovascular;  Laterality: N/A;  . APPENDECTOMY  05/2010   with right hemicolectomy  . CARDIAC CATHETERIZATION  11/1999  . CAROTID ENDARTERECTOMY  2012   right  . CORONARY ARTERY BYPASS GRAFT  2001   4  . dental extractions Bilateral 1980  . DENTAL SURGERY    . [place]  . left carpal tunnel release  2009  . PARTIAL COLECTOMY  05/24/2010   Lap converted to open right proximal colectomy  . PERIPHERAL VASCULAR INTERVENTION  10/06/2019   Procedure: PERIPHERAL VASCULAR INTERVENTION;  Surgeon: Wellington Hampshire, MD;  Location: [facility];  Service: Cardiovascular;;  left lower extremity  . PROSTATE BIOPSY  03/09/14   Gleason 9, vol 98 mL  . SKIN CANCER EXCISION  02/2011  upper back  . TONSILLECTOMY  1940  . URETEROLITHOTOMY      Family History  Problem Relation Age of Onset  . Diabetes Mother   . Heart disease Mother   . Heart failure Mother   . Hypertension Mother   . Heart disease Father   . Cancer Cousin        colon  . Colon cancer Maternal Uncle        x4  . Cancer Maternal Uncle        colon  . Cancer Daughter        breast  . Cancer Daughter        breast    Social History   Socioeconomic History  . Marital status: Married    Spouse name: [name]  . Number of children: 2  . Years of education: HS  . Highest education level: Not on file  Occupational History  . Occupation:  Retired  Tobacco Use  . Smoking status: Former Smoker    Packs/day: 2.00    Years: 40.00    Pack years: 80.00    Quit date: 11/23/1999    Years since quitting: 20.3  . Smokeless tobacco: Current User    Types: Chew  Substance and Sexual Activity  . Alcohol use: No  . Drug use: No  . Sexual activity: Yes    Partners: Female  Other Topics Concern  . Not on file  Social History Narrative   No regular exercise. Daily caffeine- 3-4 cups daily         Social Determinants of Health   Financial Resource Strain:   . Difficulty of Paying Living Expenses: Not on file  Food Insecurity:   . Worried About Charity fundraiser in the Last Year: Not on file  . Ran Out of Food in the Last Year: Not on file  Transportation Needs:   . Lack of Transportation (Medical): Not on file  . Lack of Transportation (Non-Medical): Not on file  Physical Activity:   . Days of Exercise per Week: Not on file  . Minutes of Exercise per Session: Not on file  Stress:   . Feeling of Stress : Not on file  Social Connections:   . Frequency of Communication with Friends and Family: Not on file  . Frequency of Social Gatherings with Friends and Family: Not on file  . Attends Religious Services: Not on file  . Active Member of Clubs or Organizations: Not on file  . Attends Archivist Meetings: Not on file  . Marital Status: Not on file  Intimate Partner Violence:   . Fear of Current or Ex-Partner: Not on file  . Emotionally Abused: Not on file  . Physically Abused: Not on file  . Sexually Abused: Not on file    Outpatient Medications Prior to Visit  Medication Sig Dispense Refill  . Albuterol Sulfate (PROAIR RESPICLICK) 007 (90 Base) MCG/ACT AEPB Inhale 2 puffs into the lungs every 6 (six) hours as needed. 3 each 4  . amLODipine (NORVASC) 5 MG tablet Take 1 tablet (5 mg total) by mouth daily. 90 tablet 1  . aspirin EC 81 MG tablet Take 1 tablet (81 mg total) by mouth daily. 90 tablet 3  .  clopidogrel (PLAVIX) 75 MG tablet Take 1 tablet (75 mg total) by mouth daily. 90 tablet 3  . DULoxetine (CYMBALTA) 60 MG capsule TAKE 1 CAPSULE BY MOUTH  DAILY (Patient taking differently: Take 60 mg by mouth daily. ) 90  capsule 3  . Insulin Pen Needle (BD PEN NEEDLE NANO U/F) 32G X 4 MM MISC INJECT SUBCUTANEOUSLY DAILY 90 each 3  . lisinopril (ZESTRIL) 20 MG tablet TAKE 1 TABLET BY MOUTH AT  BEDTIME (Patient taking differently: Take 20 mg by mouth daily. ) 90 tablet 3  . metFORMIN (GLUCOPHAGE) 1000 MG tablet TAKE 1 TABLET BY MOUTH  TWICE DAILY WITH MEALS (Patient taking differently: Take 1,000 mg by mouth 2 (two) times daily with a meal. ) 180 tablet 3  . metoprolol tartrate (LOPRESSOR) 50 MG tablet TAKE 1 TABLET BY MOUTH  TWICE DAILY 180 tablet 3  . Multiple Vitamins-Minerals (MULTIVITAMIN WITH MINERALS) tablet Take 1 tablet by mouth daily.    . Multiple Vitamins-Minerals (PRESERVISION AREDS 2+MULTI VIT) CAPS Take 1 capsule by mouth 2 (two) times daily at 10 AM and 5 PM. **Contains 6 mg of Vitamin B 6**    . pyridOXINE (VITAMIN B-6) 25 MG tablet Take 25 mg by mouth daily.    . rosuvastatin (CRESTOR) 10 MG tablet Take 1 tablet (10 mg total) by mouth daily. 90 tablet 3  . TOUJEO SOLOSTAR 300 UNIT/ML Solostar Pen INJECT 38 UNITS INTO THE  SKIN AT BEDTIME 13.5 mL 3  . umeclidinium bromide (INCRUSE ELLIPTA) 62.5 MCG/INH AEPB Inhale 1 puff into the lungs daily. 90 each 3  . doxycycline (VIBRA-TABS) 100 MG tablet Take 1 tablet (100 mg total) by mouth 2 (two) times daily. 20 tablet 0  . glipiZIDE (GLUCOTROL) 10 MG tablet TAKE 1 TABLET BY MOUTH  TWICE A DAY BEFORE MEALS (Patient taking differently: Take 10 mg by mouth 2 (two) times daily before a meal. ) 180 tablet 3   No facility-administered medications prior to visit.    Allergies  Allergen Reactions  . Shellfish Allergy Anaphylaxis    Swelling of the throat  . Statins Other (See Comments)    Myalgias, urinary frequency  . Atorvastatin Other (See  Comments)    urinary frequency, palpitations  . Ezetimibe Other (See Comments)    "hip problems"  . Flomax [Tamsulosin Hcl] Other (See Comments)    Complains of dizziness  . Prednisone Other (See Comments)    Crazy dreams, insomnia  . Tizanidine Other (See Comments)    Insomnia, nightmares    ROS Review of Systems    Objective:    Physical Exam Constitutional:      Appearance: He is well-developed.  HENT:     Head: Normocephalic and atraumatic.  Cardiovascular:     Rate and Rhythm: Normal rate and regular rhythm.     Heart sounds: Normal heart sounds.  Pulmonary:     Effort: Pulmonary effort is normal.     Breath sounds: Normal breath sounds.  Skin:    General: Skin is warm and dry.  Neurological:     Mental Status: He is alert and oriented to person, place, and time.  Psychiatric:        Behavior: Behavior normal.     BP (!) 144/53   Pulse 66   Ht 5\' 9"  (1.753 m)   Wt 207 lb (93.9 kg)   SpO2 96%   BMI 30.57 kg/m  Wt Readings from Last 3 Encounters:  03/21/20 207 lb (93.9 kg)  01/20/20 (!) 211 lb (95.7 kg)  01/04/20 210 lb (95.3 kg)     There are no preventive care reminders to display for this patient.  There are no preventive care reminders to display for this patient.  Lab Results  Component  Value Date   TSH 2.02 01/05/2019   Lab Results  Component Value Date   WBC 8.3 01/04/2020   HGB 12.6 (L) 01/04/2020   HCT 39.0 01/04/2020   MCV 88.8 01/04/2020   PLT 233 01/04/2020   Lab Results  Component Value Date   NA 141 01/10/2020   K 4.7 01/10/2020   CO2 26 01/10/2020   GLUCOSE 173 (H) 01/10/2020   BUN 19 01/10/2020   CREATININE 1.39 (H) 01/10/2020   BILITOT 0.4 01/10/2020   ALKPHOS 69 09/28/2019   AST 20 01/10/2020   ALT 20 01/10/2020   PROT 6.6 01/10/2020   ALBUMIN 4.5 09/28/2019   CALCIUM 9.2 01/10/2020   ANIONGAP 11 06/23/2019   GFR 59.22 (L) 03/06/2012   Lab Results  Component Value Date   CHOL 122 09/28/2019   Lab Results   Component Value Date   HDL 50 09/28/2019   Lab Results  Component Value Date   LDLCALC 52 09/28/2019   Lab Results  Component Value Date   TRIG 111 09/28/2019   Lab Results  Component Value Date   CHOLHDL 2.4 09/28/2019   Lab Results  Component Value Date   HGBA1C 6.2 (A) 03/21/2020      Assessment & Plan:   Problem List Items Addressed This Visit      Cardiovascular and Mediastinum   Essential hypertension    Pressure is a little elevated today not sure why it looked fantastic about 2 months ago so for now we will just cannot keep an eye on this.  He is actually down about 4 pounds I do not think it is related to that.      DM (diabetes mellitus) with peripheral vascular complication ([place]) - Primary    Well controlled. Continue current regimen. Follow up in  3-4 months.    Lab Results  Component Value Date   HGBA1C 6.2 (A) 03/21/2020         Relevant Orders   POCT glycosylated hemoglobin (Hb A1C) (Completed)     Nervous and Auditory   Weakness of both lower extremities    He felt like this really has not improved significantly since having treatment for PVD but he does have a follow-up appointment next month with vascular surgery we did discuss the importance of getting moving he is very sedentary to there during the day and I do think this is probably aggravating his symptoms.  He may be a great candidate for more formal physical therapy as well as.  I still think this could be related to his spinal stenosis and so we discussed also getting him referred back to the orthopedist after he consults with vascular surgery to make sure that there is no new blockages etc.        Other   Dizziness    Has new consult with neurology in 2 weeks.  MRI normal except for sinusitis but that was treated.  Echo was OK.  Not improving with home PT for BPPV.  Taking time with position change.         Other Visit Diagnoses    Need for immunization against influenza        Relevant Orders   Flu Vaccine QUAD High Dose(Fluad) (Completed)   Nonintractable headache, unspecified chronicity pattern, unspecified headache type       Cervical pain         Headaches - likely aggravated from his neck. Ok to stop the exercises for vertigo since not really helping  right now.  We also discussed okay to take 2 Tylenol to see if this is more effective since 1 Tylenol does not seem to be helping very much if that still not effective then we could consider a trial of tramadol.  He will let me know.  No orders of the defined types were placed in this encounter.   Follow-up: Return in about 3 months (around 06/20/2020) for Diabetes follow-up.   Time spent in encounter 35 minutes.  [name], MD

## 2020-03-22 ENCOUNTER — Encounter: Payer: Self-pay | Admitting: Family Medicine

## 2020-03-22 NOTE — Assessment & Plan Note (Signed)
Pressure is a little elevated today not sure why it looked fantastic about 2 months ago so for now we will just cannot keep an eye on this.  He is actually down about 4 pounds I do not think it is related to that.

## 2020-03-22 NOTE — Assessment & Plan Note (Signed)
He felt like this really has not improved significantly since having treatment for PVD but he does have a follow-up appointment next month with vascular surgery we did discuss the importance of getting moving he is very sedentary to there during the day and I do think this is probably aggravating his symptoms.  He may be a great candidate for more formal physical therapy as well as.  I still think this could be related to his spinal stenosis and so we discussed also getting him referred back to the orthopedist after he consults with vascular surgery to make sure that there is no new blockages etc.

## 2020-03-29 ENCOUNTER — Ambulatory Visit: Payer: Medicare Other | Admitting: Neurology

## 2020-03-29 ENCOUNTER — Encounter: Payer: Self-pay | Admitting: Neurology

## 2020-03-29 ENCOUNTER — Telehealth: Payer: Self-pay | Admitting: Family Medicine

## 2020-03-29 ENCOUNTER — Other Ambulatory Visit: Payer: Self-pay

## 2020-03-29 VITALS — Ht 69.0 in | Wt 208.0 lb

## 2020-03-29 DIAGNOSIS — R351 Nocturia: Secondary | ICD-10-CM

## 2020-03-29 DIAGNOSIS — I951 Orthostatic hypotension: Secondary | ICD-10-CM | POA: Diagnosis not present

## 2020-03-29 DIAGNOSIS — I779 Disorder of arteries and arterioles, unspecified: Secondary | ICD-10-CM | POA: Diagnosis not present

## 2020-03-29 DIAGNOSIS — E669 Obesity, unspecified: Secondary | ICD-10-CM

## 2020-03-29 DIAGNOSIS — R42 Dizziness and giddiness: Secondary | ICD-10-CM

## 2020-03-29 DIAGNOSIS — Z951 Presence of aortocoronary bypass graft: Secondary | ICD-10-CM

## 2020-03-29 NOTE — Patient Instructions (Addendum)
Unfortunately, dizziness is a very common complaint but is often not due to a primary neurological reason or single underlying medical problem. Often, there a combination of factors, that result in dizziness. This includes blood pressure fluctuations, medication side effects, blood sugar fluctuations, stress, vertigo, poor sleep with sleep deprivation, dehydration, and electrolyte disturbance or other metabolic and endocrinological reasons, meaning hormone related problems such as thyroid dysfunction.  You do have a fairly significant blood pressure drop when you stand up compared to the seated position.  You have a 30 point drop in the systolic meaning top number and you had a 10 point drop in the lower number, diastolic.  This can cause in part feeling of lightheadedness and dizziness.  You are supposed to have a ultrasound of your neck arteries.  You have a history of right carotid artery surgery.  I would recommend a home sleep test to evaluate you for underlying sleep apnea.  If you have obstructive sleep apnea you may benefit from a machine called CPAP or AutoPap.  Please try to hydrate well with water and change positions slowly.  Please do not make any sudden movements with your head.  Stand up slowly and get your bearings first.  We will call you to schedule your home sleep test.  We will also call you with the results and take it from there.

## 2020-03-29 NOTE — Telephone Encounter (Signed)
Please call patient and see how his blood pressures have been doing at home.  Just want to see if they are looking okay or if he still having some low blood pressures.  Also just wanted to let them know we could always decrease his Cymbalta to 30 mg for couple weeks to see if he notices if he feels better than when on the 60 mg.  We can rule that out as a cause of dizziness.  If you would like to try it I can always send over a months worth of the 30 mg capsules.

## 2020-03-29 NOTE — Progress Notes (Signed)
Subjective:    Patient ID: Clayton Harrison is a 86 y.o. male.  HPI     Clayton Harrison [facility] [address], [address]O. [place], [place] [address]  Dear Clayton Harrison,   I saw your patient, Clayton Harrison, upon your kind request in my neurologic clinic today for initial consultation of his dizziness.  The patient is accompanied by his wife (of 53 years) today.  As you know, Clayton Harrison is an 86 year old right-handed man with an underlying complex medical history of peripheral vascular disease, prostate cancer, macular degeneration, interstitial cystitis, hyperlipidemia, hypertension, hearing loss, reflux disease, diabetes, retinopathy, cataracts, carotid artery occlusion, coronary artery disease, status post CABG x4, anemia, skin cancer, history of small bowel obstruction, and mild obesity, who reports a several month history, approximately 6 to 8 months of intermittent dizziness.  He describes a fleeting sensation of spinning but not as significant as he used to have with regards to his vertigo.  Sometimes he does feel lightheaded when he is standing, sometimes he feels like he is going to pass out and has to hold onto things.  He has not fallen thankfully he has not had any loss of consciousness.  He denies any recurrent headaches.  He was recently advised to reduce his amlodipine as I understand.  He is followed by cardiology for his carotid artery disease and gets a yearly carotid ultrasound.  He is due this month.  Last ultrasound was in September 2020.   He had surgery for his peripheral vascular disease and also surgery to the right carotid in the distant past.  Status post four-vessel bypass several years ago, approximately 20 years ago.   I reviewed your office note from 01/20/2020.  He was treated with Augmentin for chronic sinusitis.  He was referred to physical therapy.  He did not think physical therapy was helpful.  He used to be on meclizine for years  for vertigo but was recently advised to stop it.  He had a brain MRI with and without contrast on 11/29/2019 and I reviewed the results: IMPRESSION: 1. No acute intracranial abnormality. 2. Mild chronic small vessel ischemic disease. 3. Right maxillary sinusitis.  He denies any sudden onset of one-sided weakness or numbness or tingling.  He denies recurrent headaches.  He does snore, he does not snore severely, wife has not noticed any obvious pauses in his breathing while he is asleep but he does have significant nocturia about 4-5 times per average night, denies morning headaches.  He has never had a sleep study.  His Past Medical History Is Significant For: Past Medical History:  Diagnosis Date  . Adenomatous colon polyp    2.5cm excised by right colectomy 2011  . Anemia, blood loss    history of  . Blockage of coronary artery of heart ([place]) 2001   x 4  . CAD (coronary artery disease)   . Carotid artery occlusion   . Cataract   . Cerebrovascular disease, unspecified   . Colon tumor 03/2010   right ascending  . Diabetic retinopathy ([place]) 09/29/2017  . DM (diabetes mellitus) ([place])   . Elevated PSA   . GERD (gastroesophageal reflux disease)   . Hearing loss   . HTN (hypertension)   . Hypercholesteremia   . Interstitial cystitis   . Macular degeneration   . Other and unspecified hyperlipidemia   . Prostate cancer ([place]) 03/09/14   Gleason 4+5=9, volume 98 mL  . PVD (peripheral vascular  disease) ([place])   . S/P radiation therapy 07/20/2014 through 09/13/2014    Prostate 7800 cGy in 40 sessions, seminal vesicles 5600 cGy in 40 sessions   . SBO (small bowel obstruction) ([place]) 07/2013  . Skin cancer 2012   melanoma upper back, removed  . Statin intolerance   . Vertigo   . Vertigo     His Past Surgical History Is Significant For: Past Surgical History:  Procedure Laterality Date  . ABDOMINAL AORTOGRAM  W/LOWER EXTREMITY N/A 10/06/2019   Procedure: ABDOMINAL AORTOGRAM W/LOWER EXTREMITY;  Surgeon: Wellington Hampshire, MD;  Location: [facility];  Service: Cardiovascular;  Laterality: N/A;  . APPENDECTOMY  05/2010   with right hemicolectomy  . CARDIAC CATHETERIZATION  11/1999  . CAROTID ENDARTERECTOMY  2012   right  . CORONARY ARTERY BYPASS GRAFT  2001   4  . dental extractions Bilateral 1980  . DENTAL SURGERY    . [place]  . left carpal tunnel release  2009  . PARTIAL COLECTOMY  05/24/2010   Lap converted to open right proximal colectomy  . PERIPHERAL VASCULAR INTERVENTION  10/06/2019   Procedure: PERIPHERAL VASCULAR INTERVENTION;  Surgeon: Wellington Hampshire, MD;  Location: [facility];  Service: Cardiovascular;;  left lower extremity  . PROSTATE BIOPSY  03/09/14   Gleason 9, vol 98 mL  . SKIN CANCER EXCISION  02/2011   upper back  . TONSILLECTOMY  1940  . URETEROLITHOTOMY      His Family History Is Significant For: Family History  Problem Relation Age of Onset  . Diabetes Mother   . Heart disease Mother   . Heart failure Mother   . Hypertension Mother   . Heart disease Father   . Cancer Cousin        colon  . Colon cancer Maternal Uncle        x4  . Cancer Maternal Uncle        colon  . Cancer Daughter        breast  . Cancer Daughter        breast    His Social History Is Significant For: Social History   Socioeconomic History  . Marital status: Married    Spouse name: Clayton Harrison  . Number of children: 2  . Years of education: HS  . Highest education level: Not on file  Occupational History  . Occupation: Retired  Tobacco Use  . Smoking status: Former Smoker    Packs/day: 2.00    Years: 40.00    Pack years: 80.00    Quit date: 11/23/1999    Years since quitting: 20.3  . Smokeless tobacco: Current User    Types: Chew  Substance and Sexual Activity  . Alcohol use: No  . Drug use: No  . Sexual activity: Yes    Partners: Female   Other Topics Concern  . Not on file  Social History Narrative   No regular exercise. Daily caffeine- 3-4 cups daily         Social Determinants of Health   Financial Resource Strain:   . Difficulty of Paying Living Expenses: Not on file  Food Insecurity:   . Worried About Charity fundraiser in the Last Year: Not on file  . Ran Out of Food in the Last Year: Not on file  Transportation Needs:   . Lack of Transportation (Medical): Not on file  . Lack of Transportation (Non-Medical): Not on file  Physical Activity:   .  Days of Exercise per Week: Not on file  . Minutes of Exercise per Session: Not on file  Stress:   . Feeling of Stress : Not on file  Social Connections:   . Frequency of Communication with Friends and Family: Not on file  . Frequency of Social Gatherings with Friends and Family: Not on file  . Attends Religious Services: Not on file  . Active Member of Clubs or Organizations: Not on file  . Attends Archivist Meetings: Not on file  . Marital Status: Not on file    His Allergies Are:  Allergies  Allergen Reactions  . Shellfish Allergy Anaphylaxis    Swelling of the throat  . Statins Other (See Comments)    Myalgias, urinary frequency  . Atorvastatin Other (See Comments)    urinary frequency, palpitations  . Ezetimibe Other (See Comments)    "hip problems"  . Flomax [Tamsulosin Hcl] Other (See Comments)    Complains of dizziness  . Prednisone Other (See Comments)    Crazy dreams, insomnia  . Tizanidine Other (See Comments)    Insomnia, nightmares  :   His Current Medications Are:  Outpatient Encounter Medications as of 03/29/2020  Medication Sig  . Albuterol Sulfate (PROAIR RESPICLICK) 888 (90 Base) MCG/ACT AEPB Inhale 2 puffs into the lungs every 6 (six) hours as needed.  Marland Kitchen amLODipine (NORVASC) 5 MG tablet Take 1 tablet (5 mg total) by mouth daily.  Marland Kitchen aspirin EC 81 MG tablet Take 1 tablet (81 mg total) by mouth daily.  . clopidogrel  (PLAVIX) 75 MG tablet Take 1 tablet (75 mg total) by mouth daily.  . DULoxetine (CYMBALTA) 60 MG capsule TAKE 1 CAPSULE BY MOUTH  DAILY (Patient taking differently: Take 60 mg by mouth daily. )  . glipiZIDE (GLUCOTROL) 10 MG tablet TAKE 1 TABLET BY MOUTH  TWICE DAILY BEFORE MEALS  . Insulin Pen Needle (BD PEN NEEDLE NANO U/F) 32G X 4 MM MISC INJECT SUBCUTANEOUSLY DAILY  . lisinopril (ZESTRIL) 20 MG tablet TAKE 1 TABLET BY MOUTH AT  BEDTIME (Patient taking differently: Take 20 mg by mouth daily. )  . metFORMIN (GLUCOPHAGE) 1000 MG tablet TAKE 1 TABLET BY MOUTH  TWICE DAILY WITH MEALS (Patient taking differently: Take 1,000 mg by mouth 2 (two) times daily with a meal. )  . metoprolol tartrate (LOPRESSOR) 50 MG tablet TAKE 1 TABLET BY MOUTH  TWICE DAILY  . Multiple Vitamins-Minerals (MULTIVITAMIN WITH MINERALS) tablet Take 1 tablet by mouth daily.  . Multiple Vitamins-Minerals (PRESERVISION AREDS 2+MULTI VIT) CAPS Take 1 capsule by mouth 2 (two) times daily at 10 AM and 5 PM. **Contains 6 mg of Vitamin B 6**  . pyridOXINE (VITAMIN B-6) 25 MG tablet Take 25 mg by mouth daily.  . rosuvastatin (CRESTOR) 10 MG tablet Take 1 tablet (10 mg total) by mouth daily.  Nelva Nay SOLOSTAR 300 UNIT/ML Solostar Pen INJECT 38 UNITS INTO THE  SKIN AT BEDTIME  . umeclidinium bromide (INCRUSE ELLIPTA) 62.5 MCG/INH AEPB Inhale 1 puff into the lungs daily.   No facility-administered encounter medications on file as of 03/29/2020.  :   Review of Systems:  Out of a complete 14 point review of systems, all are reviewed and negative with the exception of these symptoms as listed below:    Review of Systems  Neurological:       Pt reports he is here to discuss worsening dizziness. Denies any recent falls. Reports dizziness has been present for several months now.  Objective:  Neurological Exam  Physical Exam Physical Examination:   Vitals:   03/29/20 1427  SpO2: 94%   He has a 30 point drop in blood  pressure in the systolic number with standing and a 10 point drop in the diastolic number with standing and does endorse mild lightheadedness upon standing.  General Examination: The patient is a very pleasant 86 y.o. male in no acute distress. He appears well-developed and well-nourished and well groomed.   HEENT: Normocephalic, atraumatic, pupils are equal, round and reactive to light, extraocular tracking is preserved, face is symmetric with normal facial animation, speech is clear without dysarthria, hearing is grossly intact.  He does not have vertiginous symptoms with head movement or position changes.  He has no carotid bruits, he does have a scar at the right carotid artery.  Airway examination reveals full dentures, moderate airway crowding given small airway entry, Mallampati class III, tonsils are absent.  Tongue protrudes centrally and palate elevates symmetrically.  Chest: Clear to auscultation without wheezing, rhonchi or crackles noted.  Heart: S1+S2+0, regular and normal without murmurs, rubs or gallops noted.   Abdomen: Soft, non-tender and non-distended with normal bowel sounds appreciated on auscultation.  Extremities: There is no pitting edema in the distal lower extremities bilaterally. Pedal pulses are intact.  Skin: Warm and dry without trophic changes noted.  Musculoskeletal: exam reveals no obvious joint deformities, tenderness or joint swelling or erythema.   Neurologically:  Mental status: The patient is awake, alert and oriented in all 4 spheres. His immediate and remote memory, attention, language skills and fund of knowledge are appropriate. There is no evidence of aphasia, agnosia, apraxia or anomia. Speech is clear with normal prosody and enunciation. Thought process is linear. Mood is normal and affect is normal.  Cranial nerves II - XII are as described above under HEENT exam. In addition: shoulder shrug is normal with equal shoulder height noted. Motor exam:  Normal bulk, strength and tone is noted.  There is no resting or postural tremor.  Romberg is not tested for safety concerns.  Reflexes are 1+ in the upper extremities and knees, absent in the ankles.   Fine motor skills are globally intact.  cerebellar testing: No dysmetria or intention tremor.  Sensory exam: intact to light touch in the upper and lower extremities.  Gait, station and balance: He stands Without difficulty but stands naturally a little wider based.  He does report some lightheadedness upon standing.  He walks without difficulty, no shuffling, preserved arm swing is noted.  No veering to one side.  No vertiginous symptoms while walking.   Assessment and Plan:  In summary, Clayton Harrison is a very pleasant 86 y.o.-year old male with an underlying complex medical history of peripheral vascular disease, prostate cancer, macular degeneration, interstitial cystitis, hyperlipidemia, hypertension, hearing loss, reflux disease, diabetes, retinopathy, cataracts, carotid artery occlusion, coronary artery disease, status post CABG x4, anemia, skin cancer, history of small bowel obstruction, and mild obesity, who presents for his intermittent dizziness of several months duration.  History and examination are not telltale for 1 specific problem.  He does have a history of vertigo but presentation is not classic for positional vertigo.  He may have several different contributors at this time, does have orthostatic blood pressure drop and does have symptoms with regards to orthostatic hypotension.  Furthermore, intermittent vertiginous symptoms may also be at play, his wife is worried about his medications as several list dizziness as a potential side effect.  He had a recent reduction in his amlodipine.  He may benefit from further reduction of his blood pressure medication.  He is advised to talk to you and his cardiologist regarding blood pressure management.  He has several vascular risk factors.  He has  follow-up for carotid artery disease which is monitored by his cardiologist.  He has recently undergone a brain MRI.  He has never had a sleep study.  He is advised that underlying obstructive sleep disordered breathing can result in significant nocturia and sleep disruption and consequently, sleep disruption can sometimes manifest with vague symptoms such as dizziness.  As such, this is not a direct cause but I recommend we pursue sleep testing.  I explained the sleep test procedure to the patient and his wife and particularly explained the difference between a laboratory attended sleep study versus a home sleep test.  He would prefer to do testing at home.  I ordered a home sleep test.  He is advised to stay well-hydrated, change positions slowly, get his bearings first, monitor blood pressure.  We will call him to schedule his home sleep test and call him with the results.  If he has obstructive sleep apnea, we talked about treatment with a CPAP or AutoPap machine.  He would be willing to try it.  I plan to see him back after sleep testing.  I answered all their questions today and the patient and his wife were in agreement.   Thank you very much for allowing me to participate in the care of this nice patient. If I can be of any further assistance to you please do not hesitate to call me at [phone].  Sincerely,   Clayton Harrison

## 2020-03-30 ENCOUNTER — Telehealth: Payer: Self-pay

## 2020-03-30 NOTE — Telephone Encounter (Signed)
LVM to schedule HST °

## 2020-03-31 NOTE — Telephone Encounter (Signed)
Spoke with patient, he reports that his BP is still running low but could not tell me any readings.  He reports still having a lot of dizziness.  Discussed with patient about decreasing the Cymbalta to 30mg  to see if it helps with the dizziness.  He stated that he would be willing to do anything to help the dizziness.  [facility] uses [facility].

## 2020-04-03 ENCOUNTER — Encounter: Payer: Self-pay | Admitting: Family Medicine

## 2020-04-03 ENCOUNTER — Other Ambulatory Visit: Payer: Self-pay | Admitting: Family Medicine

## 2020-04-03 ENCOUNTER — Telehealth: Payer: Self-pay

## 2020-04-03 DIAGNOSIS — B009 Herpesviral infection, unspecified: Secondary | ICD-10-CM

## 2020-04-03 MED ORDER — VALACYCLOVIR HCL 1 G PO TABS: 500.0000 mg | ORAL_TABLET | Freq: Two times a day (BID) | ORAL | 0 refills | Status: DC

## 2020-04-03 MED ORDER — DULOXETINE HCL 30 MG PO CPEP: 30.0000 mg | ORAL_CAPSULE | Freq: Every day | ORAL | 0 refills | Status: DC

## 2020-04-03 NOTE — Telephone Encounter (Signed)
2nd call -   Pt's wife called stating that pt is still waiting to get lower dose of Cymbalta. Per last telephone call on 03/29/20, Cymbalta was going to be reduce to 30 mg to minimize dizziness. Requesting for rx [facility] be sent [facility].

## 2020-04-03 NOTE — Telephone Encounter (Signed)
Lower dose cymbalt sent to pharmacy.  Bp at Neurology looked OK

## 2020-04-04 NOTE — Telephone Encounter (Signed)
Patient aware the new dose of medication was sent to the pharmacy.  He will let us know if this lessens the dizziness.

## 2020-04-17 ENCOUNTER — Ambulatory Visit (INDEPENDENT_AMBULATORY_CARE_PROVIDER_SITE_OTHER): Payer: Medicare Other | Admitting: Neurology

## 2020-04-17 DIAGNOSIS — G4733 Obstructive sleep apnea (adult) (pediatric): Secondary | ICD-10-CM | POA: Diagnosis not present

## 2020-04-17 DIAGNOSIS — I779 Disorder of arteries and arterioles, unspecified: Secondary | ICD-10-CM

## 2020-04-17 DIAGNOSIS — R351 Nocturia: Secondary | ICD-10-CM

## 2020-04-17 DIAGNOSIS — E669 Obesity, unspecified: Secondary | ICD-10-CM

## 2020-04-17 DIAGNOSIS — Z951 Presence of aortocoronary bypass graft: Secondary | ICD-10-CM

## 2020-04-17 DIAGNOSIS — I951 Orthostatic hypotension: Secondary | ICD-10-CM

## 2020-04-17 DIAGNOSIS — R42 Dizziness and giddiness: Secondary | ICD-10-CM

## 2020-04-18 ENCOUNTER — Telehealth: Payer: Self-pay | Admitting: *Deleted

## 2020-04-18 NOTE — Telephone Encounter (Signed)
Pt wanted to know how to wean off of the cymbalta. Will fwd to pcp for advice.

## 2020-04-19 NOTE — Telephone Encounter (Signed)
Since has been doing well on 30mg  dose, decrease to one tab every other day for 10 days and then stop completely.

## 2020-04-19 NOTE — Addendum Note (Signed)
Addended by: [name] D on: 04/19/2020 04:45 PM   Modules accepted: Orders

## 2020-04-20 ENCOUNTER — Ambulatory Visit (HOSPITAL_COMMUNITY)
Admission: RE | Admit: 2020-04-20 | Discharge: 2020-04-20 | Disposition: A | Discharge status: Home / Self Care | Payer: Medicare Other | Source: Ambulatory Visit | Attending: Cardiovascular Disease | Admitting: Cardiovascular Disease

## 2020-04-20 ENCOUNTER — Other Ambulatory Visit: Payer: Self-pay | Admitting: Cardiovascular Disease

## 2020-04-20 ENCOUNTER — Ambulatory Visit (HOSPITAL_BASED_OUTPATIENT_CLINIC_OR_DEPARTMENT_OTHER)
Admission: RE | Admit: 2020-04-20 | Discharge: 2020-04-20 | Disposition: A | Discharge status: Home / Self Care | Payer: Medicare Other | Source: Ambulatory Visit | Attending: Cardiology | Admitting: Cardiology

## 2020-04-20 DIAGNOSIS — I739 Peripheral vascular disease, unspecified: Secondary | ICD-10-CM | POA: Diagnosis not present

## 2020-04-20 DIAGNOSIS — I6523 Occlusion and stenosis of bilateral carotid arteries: Secondary | ICD-10-CM

## 2020-04-20 DIAGNOSIS — M79605 Pain in left leg: Secondary | ICD-10-CM

## 2020-04-20 NOTE — Telephone Encounter (Signed)
Left detailed vm with instructions of how to taper off of the Cymbalta.

## 2020-04-21 ENCOUNTER — Encounter: Payer: Self-pay | Admitting: *Deleted

## 2020-04-24 ENCOUNTER — Other Ambulatory Visit: Payer: Self-pay | Admitting: Family Medicine

## 2020-04-25 NOTE — Progress Notes (Signed)
Patient referred by Dr. Madilyn Fireman for dizziness and reported snoring and sleep disruption from nocturia, seen by me on 03/29/20. HST on 04/17/20.  Please call and notify the patient that the recent home sleep test showed obstructive sleep apnea in the moderate range. I recommend treatment for this in the form of autoPAP, which means, that we don't have to bring him in for a sleep study with CPAP, but will let him try an autoPAP machine at home, through a DME company (of his choice, or as per insurance requirement). The DME representative will educate him on how to use the machine, how to put the mask on, etc. I have placed an order in the chart. Please send referral, talk to patient, send report to referring MD. We will need a FU in sleep clinic for 10 weeks post-PAP set up, please arrange that with me or one of our NPs. Thanks,   Clayton Age, MD, PhD [facility])

## 2020-04-25 NOTE — Addendum Note (Signed)
Addended by: Star Age on: 04/25/2020 07:35 AM   Modules accepted: Orders

## 2020-04-25 NOTE — Procedures (Signed)
   [facility]  HOME SLEEP TEST (Watch PAT)  STUDY DATE: 04/17/20  DOB: 1931-04-11  MRN: [id number]  ORDERING CLINICIAN: Star Age, MD, PhD   REFERRING CLINICIAN: Dr. Madilyn Fireman  CLINICAL INFORMATION/HISTORY:  86 year old man with a history of peripheral vascular disease, prostate cancer, macular degeneration, interstitial cystitis, hyperlipidemia, hypertension, hearing loss, reflux disease, diabetes, retinopathy, cataracts, carotid artery occlusion, coronary artery disease, status post CABG x4, anemia, skin cancer, history of small bowel obstruction, and mild obesity, who reports a several month history, approximately 6 to 8 months of intermittent dizziness. He reports snoring and nocturia.  BMI: 30.7 kg/m  FINDINGS:   Total Record Time (hours, min): 9h,20min       Total Sleep Time (hours, min): 8h,11min  Percent REM (%): 10.2   Calculated pAHI (per hour):  27.6      REM pAHI:    25.6  NREM pAHI: 27.7 Pulse Mean (bpm):   60  Pulse Range( NA-103)    Oxygen Saturation (%) Mean:94  Minimum oxygen saturation (%):   80           O2 Saturation Range (%):80-98  O2Saturation (minutes) <=88%: 0.1  IMPRESSION: OSA (obstructive sleep apnea), moderate  RECOMMENDATION:  This home sleep test demonstrates moderate obstructive sleep apnea with a total AHI of 27.6/hour and O2 nadir of 80%. Treatment with positive airway pressure is recommended. The patient will be advised to proceed with an autoPAP titration/trial at home for now.  A full night titration study with CPAP can be considered to optimize treatment settings, if needed down the road. Please note that untreated obstructive sleep apnea may carry additional perioperative morbidity. Patients with significant obstructive sleep apnea should receive perioperative PAP therapy and the surgeons and particularly the anesthesiologist should be informed of the diagnosis and the severity of the sleep disordered breathing. The patient  should be cautioned not to drive, work at heights, or operate dangerous or heavy equipment when tired or sleepy. Review and reiteration of good sleep hygiene measures should be pursued with any patient. Other causes of the patient's symptoms, including circadian rhythm disturbances, an underlying mood disorder, medication effect and/or an underlying medical problem cannot be ruled out based on this test. Clinical correlation is recommended. The patient and his referring provider will be notified of the test results. The patient will be seen in follow up in sleep clinic at [facility].  I certify that I have reviewed the raw data recording prior to the issuance of this report in accordance with the standards of the American Academy of Sleep Medicine (AASM).  INTERPRETING PHYSICIAN:    Star Age, MD, PhD  Certified in Neurology, Sleep Medicine  [facility] [address] [place], [place] [address] [phone]

## 2020-04-26 ENCOUNTER — Telehealth: Payer: Self-pay

## 2020-04-26 NOTE — Telephone Encounter (Signed)
-----   Message from Star Age, MD sent at 04/25/2020  7:35 AM EDT ----- Patient referred by Dr. Madilyn Fireman for dizziness and reported snoring and sleep disruption from nocturia, seen by me on 03/29/20. HST on 04/17/20.  Please call and notify the patient that the recent home sleep test showed obstructive sleep apnea in the moderate range. I recommend treatment for this in the form of autoPAP, which means, that we don't have to bring him in for a sleep study with CPAP, but will let him try an autoPAP machine at home, through a DME company (of his choice, or as per insurance requirement). The DME representative will educate him on how to use the machine, how to put the mask on, etc. I have placed an order in the chart. Please send referral, talk to patient, send report to referring MD. We will need a FU in sleep clinic for 10 weeks post-PAP set up, please arrange that with me or one of our NPs. Thanks,   Star Age, MD, PhD [facility])

## 2020-04-26 NOTE — Telephone Encounter (Signed)
I called pt. I advised pt that Dr. Rexene Alberts reviewed their sleep study results and found that pt has moderate osa. Dr. Rexene Alberts recommends that pt start an auto pap at home. I reviewed PAP compliance expectations with the pt. Pt is agreeable to starting an auto-PAP. I advised pt that an order will be sent to a DME, [facility], and [facility] will call the pt within about one week after they file with the pt's insurance. [facility] will show the pt how to use the machine, fit for masks, and troubleshoot the auto-PAP if needed. A follow up appt was made for insurance purposes with Dr. Rexene Alberts on 08/10/20 at 2:00pm. Pt verbalized understanding to arrive 15 minutes early and bring their auto-PAP. A letter with all of this information in it will be mailed to the pt as a reminder. I verified with the pt that the address we have on file is correct. Pt verbalized understanding of results. Pt had no questions at this time but was encouraged to call back if questions arise. I have sent the order to [facility] and have received confirmation that they have received the order.

## 2020-04-26 NOTE — Progress Notes (Signed)
HPI: FU CAD.Pt isstatus post coronary artery bypass graft in June 2001. Abdominal ultrasound in June of 2006 showed no aneurysm. Nuclear study December 2016 showed ejection fraction 61%, apical thinning but no ischemia. Echocardiogram January 2021 showed normal LV function, mild left ventricular hypertrophy, grade 1 diastolic dysfunction, mild left ventricular enlargement.  Monitor March 2021 with no significant arrhythmia. Had intravascular lithotripsy and drug coated balloon angioplasty of left common femoral artery and left SFA April 2021. ABIs October 2021 mild on the right and moderate on the left. Carotid Dopplers October 2021 1 to 39% bilaterally. Since last seen he has some dyspnea on exertion. No orthopnea, PND, pedal edema or chest pain. He continues to have palpitations. These predominantly occur at night and they can wake him up from sleep. He is having orthostatic symptoms.   Current Outpatient Medications  Medication Sig Dispense Refill  . Albuterol Sulfate (PROAIR RESPICLICK) 161 (90 Base) MCG/ACT AEPB Inhale 2 puffs into the lungs every 6 (six) hours as needed. 3 each 4  . amLODipine (NORVASC) 5 MG tablet Take 1 tablet (5 mg total) by mouth daily. 90 tablet 1  . aspirin EC 81 MG tablet Take 1 tablet (81 mg total) by mouth daily. 90 tablet 3  . clopidogrel (PLAVIX) 75 MG tablet Take 1 tablet (75 mg total) by mouth daily. 90 tablet 3  . DULoxetine (CYMBALTA) 30 MG capsule TAKE 1 CAPSULE BY MOUTH EVERY DAY 30 capsule 0  . glipiZIDE (GLUCOTROL) 10 MG tablet TAKE 1 TABLET BY MOUTH  TWICE DAILY BEFORE MEALS 180 tablet 3  . Insulin Pen Needle (BD PEN NEEDLE NANO U/F) 32G X 4 MM MISC INJECT SUBCUTANEOUSLY DAILY 90 each 3  . lisinopril (ZESTRIL) 20 MG tablet TAKE 1 TABLET BY MOUTH AT  BEDTIME 90 tablet 3  . metFORMIN (GLUCOPHAGE) 1000 MG tablet TAKE 1 TABLET BY MOUTH  TWICE DAILY WITH MEALS 180 tablet 3  . metoprolol tartrate (LOPRESSOR) 50 MG tablet TAKE 1 TABLET BY MOUTH  TWICE  DAILY 180 tablet 3  . Multiple Vitamins-Minerals (MULTIVITAMIN WITH MINERALS) tablet Take 1 tablet by mouth daily.    . Multiple Vitamins-Minerals (PRESERVISION AREDS 2+MULTI VIT) CAPS Take 1 capsule by mouth 2 (two) times daily at 10 AM and 5 PM. **Contains 6 mg of Vitamin B 6**    . pyridOXINE (VITAMIN B-6) 25 MG tablet Take 25 mg by mouth daily.    . rosuvastatin (CRESTOR) 10 MG tablet Take 1 tablet (10 mg total) by mouth daily. 90 tablet 3  . TOUJEO SOLOSTAR 300 UNIT/ML Solostar Pen INJECT 38 UNITS INTO THE  SKIN AT BEDTIME 13.5 mL 3  . umeclidinium bromide (INCRUSE ELLIPTA) 62.5 MCG/INH AEPB Inhale 1 puff into the lungs daily. 90 each 3  . valACYclovir (VALTREX) 1000 MG tablet Take 0.5 tablets (500 mg total) by mouth 2 (two) times daily. X 5 days PRN outbreak. 90 tablet 0   No current facility-administered medications for this visit.     Past Medical History:  Diagnosis Date  . Adenomatous colon polyp    2.5cm excised by right colectomy 2011  . Anemia, blood loss    history of  . Blockage of coronary artery of heart ([place]) 2001   x 4  . CAD (coronary artery disease)   . Carotid artery occlusion   . Cataract   . Cerebrovascular disease, unspecified   . Colon tumor 03/2010   right ascending  . Diabetic retinopathy ([place]) 09/29/2017  . DM (  diabetes mellitus) ([place])   . Elevated PSA   . GERD (gastroesophageal reflux disease)   . Hearing loss   . HTN (hypertension)   . Hypercholesteremia   . Interstitial cystitis   . Macular degeneration   . Other and unspecified hyperlipidemia   . Prostate cancer ([place]) 03/09/14   Gleason 4+5=9, volume 98 mL  . PVD (peripheral vascular disease) ([place])   . S/P radiation therapy 07/20/2014 through 09/13/2014    Prostate 7800 cGy in 40 sessions, seminal vesicles 5600 cGy in 40 sessions   . SBO (small bowel obstruction) ([employer]) 07/2013  . Skin cancer 2012   melanoma upper back,  removed  . Statin intolerance   . Vertigo   . Vertigo     Past Surgical History:  Procedure Laterality Date  . ABDOMINAL AORTOGRAM W/LOWER EXTREMITY N/A 10/06/2019   Procedure: ABDOMINAL AORTOGRAM W/LOWER EXTREMITY;  Surgeon: Wellington Hampshire, MD;  Location: [facility];  Service: Cardiovascular;  Laterality: N/A;  . APPENDECTOMY  05/2010   with right hemicolectomy  . CARDIAC CATHETERIZATION  11/1999  . CAROTID ENDARTERECTOMY  2012   right  . CORONARY ARTERY BYPASS GRAFT  2001   4  . dental extractions Bilateral 1980  . DENTAL SURGERY    . [place]  . left carpal tunnel release  2009  . PARTIAL COLECTOMY  05/24/2010   Lap converted to open right proximal colectomy  . PERIPHERAL VASCULAR INTERVENTION  10/06/2019   Procedure: PERIPHERAL VASCULAR INTERVENTION;  Surgeon: Wellington Hampshire, MD;  Location: [facility];  Service: Cardiovascular;;  left lower extremity  . PROSTATE BIOPSY  03/09/14   Gleason 9, vol 98 mL  . SKIN CANCER EXCISION  02/2011   upper back  . TONSILLECTOMY  1940  . URETEROLITHOTOMY      Social History   Socioeconomic History  . Marital status: Married    Spouse name: [name]  . Number of children: 2  . Years of education: HS  . Highest education level: Not on file  Occupational History  . Occupation: Retired  Tobacco Use  . Smoking status: Former Smoker    Packs/day: 2.00    Years: 40.00    Pack years: 80.00    Quit date: 11/23/1999    Years since quitting: 20.4  . Smokeless tobacco: Current User    Types: Chew  Substance and Sexual Activity  . Alcohol use: No  . Drug use: No  . Sexual activity: Yes    Partners: Female  Other Topics Concern  . Not on file  Social History Narrative   No regular exercise. Daily caffeine- 3-4 cups daily         Social Determinants of Health   Financial Resource Strain:   . Difficulty of Paying Living Expenses: Not on file  Food Insecurity:   . Worried About Charity fundraiser  in the Last Year: Not on file  . Ran Out of Food in the Last Year: Not on file  Transportation Needs:   . Lack of Transportation (Medical): Not on file  . Lack of Transportation (Non-Medical): Not on file  Physical Activity:   . Days of Exercise per Week: Not on file  . Minutes of Exercise per Session: Not on file  Stress:   . Feeling of Stress : Not on file  Social Connections:   . Frequency of Communication with Friends and Family: Not on file  . Frequency of Social Gatherings with Friends  and Family: Not on file  . Attends Religious Services: Not on file  . Active Member of Clubs or Organizations: Not on file  . Attends Archivist Meetings: Not on file  . Marital Status: Not on file  Intimate Partner Violence:   . Fear of Current or Ex-Partner: Not on file  . Emotionally Abused: Not on file  . Physically Abused: Not on file  . Sexually Abused: Not on file    Family History  Problem Relation Age of Onset  . Diabetes Mother   . Heart disease Mother   . Heart failure Mother   . Hypertension Mother   . Heart disease Father   . Cancer Cousin        colon  . Colon cancer Maternal Uncle        x4  . Cancer Maternal Uncle        colon  . Cancer Daughter        breast  . Cancer Daughter        breast    ROS: no fevers or chills, productive cough, hemoptysis, dysphasia, odynophagia, melena, hematochezia, dysuria, hematuria, rash, seizure activity, orthopnea, PND, pedal edema, claudication. Remaining systems are negative.  Physical Exam: Well-developed well-nourished in no acute distress.  Skin is warm and dry.  HEENT is normal.  Neck is supple.  Chest is clear to auscultation with normal expansion.  Cardiovascular exam is regular rate and rhythm.  Abdominal exam nontender or distended. No masses palpated. Extremities show no edema. neuro grossly intact  ECG-sinus rhythm at a rate of 70, right bundle branch block.  Personally reviewed  A/P  1  palpitations-patient continues to have palpitations. I discussed an apple watch with him today.  2 coronary artery disease-patient has not had chest pain.  Continue aspirin and statin.  3 hypertension-blood pressure elevated; however he has some orthostatic symptoms and I therefore will not advance his blood pressure medications.  4 hyperlipidemia-continue Crestor at present dose.   5 carotid artery disease-mild on most recent Dopplers.  Given history of carotid endarterectomy we will plan follow-up study October 2022.  6 peripheral vascular disease-managed by Dr. Fletcher Anon.  Continue aspirin and statin.  Symptoms have improved following previous percutaneous intervention.  Kirk Ruths, MD

## 2020-04-27 ENCOUNTER — Other Ambulatory Visit: Payer: Self-pay | Admitting: Family Medicine

## 2020-05-04 ENCOUNTER — Ambulatory Visit: Payer: Medicare Other | Admitting: Cardiology

## 2020-05-04 ENCOUNTER — Encounter: Payer: Self-pay | Admitting: Cardiology

## 2020-05-04 ENCOUNTER — Other Ambulatory Visit: Payer: Self-pay

## 2020-05-04 VITALS — BP 150/71 | HR 70 | Temp 97.0°F | Ht 69.0 in | Wt 207.2 lb

## 2020-05-04 DIAGNOSIS — I6523 Occlusion and stenosis of bilateral carotid arteries: Secondary | ICD-10-CM | POA: Diagnosis not present

## 2020-05-04 DIAGNOSIS — I10 Essential (primary) hypertension: Secondary | ICD-10-CM

## 2020-05-04 DIAGNOSIS — I2510 Atherosclerotic heart disease of native coronary artery without angina pectoris: Secondary | ICD-10-CM | POA: Diagnosis not present

## 2020-05-04 DIAGNOSIS — E785 Hyperlipidemia, unspecified: Secondary | ICD-10-CM

## 2020-05-04 DIAGNOSIS — I739 Peripheral vascular disease, unspecified: Secondary | ICD-10-CM | POA: Diagnosis not present

## 2020-05-04 NOTE — Patient Instructions (Signed)

## 2020-05-06 ENCOUNTER — Other Ambulatory Visit: Payer: Self-pay | Admitting: Family Medicine

## 2020-05-06 ENCOUNTER — Encounter: Payer: Self-pay | Admitting: Neurology

## 2020-05-06 DIAGNOSIS — B009 Herpesviral infection, unspecified: Secondary | ICD-10-CM

## 2020-05-11 ENCOUNTER — Encounter: Payer: Self-pay | Admitting: Family Medicine

## 2020-05-15 DIAGNOSIS — G4733 Obstructive sleep apnea (adult) (pediatric): Secondary | ICD-10-CM | POA: Diagnosis not present

## 2020-05-16 ENCOUNTER — Encounter: Payer: Self-pay | Admitting: Cardiovascular Disease

## 2020-05-16 ENCOUNTER — Ambulatory Visit: Payer: Medicare Other | Admitting: Cardiovascular Disease

## 2020-05-16 ENCOUNTER — Other Ambulatory Visit: Payer: Self-pay

## 2020-05-16 ENCOUNTER — Other Ambulatory Visit: Payer: Self-pay | Admitting: *Deleted

## 2020-05-16 VITALS — BP 156/62 | HR 73 | Ht 69.0 in | Wt 208.2 lb

## 2020-05-16 DIAGNOSIS — I739 Peripheral vascular disease, unspecified: Secondary | ICD-10-CM

## 2020-05-16 DIAGNOSIS — E785 Hyperlipidemia, unspecified: Secondary | ICD-10-CM

## 2020-05-16 DIAGNOSIS — I779 Disorder of arteries and arterioles, unspecified: Secondary | ICD-10-CM

## 2020-05-16 DIAGNOSIS — I10 Essential (primary) hypertension: Secondary | ICD-10-CM

## 2020-05-16 MED ORDER — GABAPENTIN 300 MG PO CAPS: 300.0000 mg | ORAL_CAPSULE | Freq: Every day | ORAL | 3 refills | Status: DC

## 2020-05-16 MED ORDER — GABAPENTIN 300 MG PO CAPS: 300.0000 mg | ORAL_CAPSULE | Freq: Every day | ORAL | 0 refills | Status: AC

## 2020-05-16 NOTE — Progress Notes (Signed)
Cardiology Office Note   Date:  05/16/2020   ID:  Clayton Harrison, DOB 12/18/1930, MRN [id number]  PCP:  Hali Marry, MD  Cardiologist: Dr. Stanford Breed  No chief complaint on file.     History of Present Illness: Clayton Harrison is a 86 y.o. male who is here today for follow-up visit regarding peripheral arterial disease.  He has known history of coronary artery disease status post CABG in 2001,  carotid disease status post right carotid endarterectomy, essential hypertension, hyperlipidemia, CKD, GERD, diabetes mellitus and peripheral arterial disease. He was seen recently for severe bilateral leg claudication and rest pain worse on the left side.  He underwent  noninvasive vascular studies in March of this year which showed an ABI of 0.72 on the right and 0.53 on the left.  Duplex showed moderate right common femoral artery disease and significant right SFA disease with two-vessel runoff below the knee.  On the left, there was significant common femoral artery disease and severe proximal SFA disease with two-vessel runoff below the knee.  Angiography in April showed no significant aortoiliac disease.  On the right side, there was borderline significant ostial SFA stenosis and severe stenosis in the distal segment.  On the left side, there was severe calcified stenosis affecting the common femoral artery and proximal and mid SFA with two-vessel runoff below the knee.  I performed successful intravascular lithotripsy and drug-coated balloon angioplasty to the left common femoral artery and left SFA. Postprocedure vascular studies showed an ABI of 0.93 on the right and 1.02 on the left.  Most recent vascular studies in October showed an ABI of 0.91 on the right and 0.76 on the left.  Duplex showed patent left common femoral artery with no significant restenosis and no significant restenosis in the left SFA.  He seems to be very frustrated today as he is having multiple chronic medical  issues.  He is extremely concerned about progressive vision loss due to macular degeneration.  This has affected his balance.  In addition, he reports bilateral leg pain mostly at night when he is trying to sleep.  The pain gets better when he rubs some lotion.  He does not walk long enough to see if he has true claudication or not.   Past Medical History:  Diagnosis Date  . Adenomatous colon polyp    2.5cm excised by right colectomy 2011  . Anemia, blood loss    history of  . Blockage of coronary artery of heart ([place]) 2001   x 4  . CAD (coronary artery disease)   . Carotid artery occlusion   . Cataract   . Cerebrovascular disease, unspecified   . Colon tumor 03/2010   right ascending  . Diabetic retinopathy ([place]) 09/29/2017  . DM (diabetes mellitus) ([place])   . Elevated PSA   . GERD (gastroesophageal reflux disease)   . Hearing loss   . HTN (hypertension)   . Hypercholesteremia   . Interstitial cystitis   . Macular degeneration   . Other and unspecified hyperlipidemia   . Prostate cancer ([place]) 03/09/14   Gleason 4+5=9, volume 98 mL  . PVD (peripheral vascular disease) ([place])   . S/P radiation therapy 07/20/2014 through 09/13/2014    Prostate 7800 cGy in 40 sessions, seminal vesicles 5600 cGy in 40 sessions   . SBO (small bowel obstruction) ([place]) 07/2013  . Skin cancer 2012   melanoma upper back, removed  . Statin intolerance   . Vertigo   .  Vertigo     Past Surgical History:  Procedure Laterality Date  . ABDOMINAL AORTOGRAM W/LOWER EXTREMITY N/A 10/06/2019   Procedure: ABDOMINAL AORTOGRAM W/LOWER EXTREMITY;  Surgeon: Wellington Hampshire, MD;  Location: [facility];  Service: Cardiovascular;  Laterality: N/A;  . APPENDECTOMY  05/2010   with right hemicolectomy  . CARDIAC CATHETERIZATION  11/1999  . CAROTID ENDARTERECTOMY  2012   right  . CORONARY ARTERY BYPASS GRAFT  2001   4  . dental extractions  Bilateral 1980  . DENTAL SURGERY    . [place]  . left carpal tunnel release  2009  . PARTIAL COLECTOMY  05/24/2010   Lap converted to open right proximal colectomy  . PERIPHERAL VASCULAR INTERVENTION  10/06/2019   Procedure: PERIPHERAL VASCULAR INTERVENTION;  Surgeon: Wellington Hampshire, MD;  Location: [facility];  Service: Cardiovascular;;  left lower extremity  . PROSTATE BIOPSY  03/09/14   Gleason 9, vol 98 mL  . SKIN CANCER EXCISION  02/2011   upper back  . TONSILLECTOMY  1940  . URETEROLITHOTOMY       Current Outpatient Medications  Medication Sig Dispense Refill  . Albuterol Sulfate (PROAIR RESPICLICK) 675 (90 Base) MCG/ACT AEPB Inhale 2 puffs into the lungs every 6 (six) hours as needed. 3 each 4  . amLODipine (NORVASC) 5 MG tablet Take 1 tablet (5 mg total) by mouth daily. 90 tablet 1  . aspirin EC 81 MG tablet Take 1 tablet (81 mg total) by mouth daily. 90 tablet 3  . glipiZIDE (GLUCOTROL) 10 MG tablet TAKE 1 TABLET BY MOUTH  TWICE DAILY BEFORE MEALS 180 tablet 3  . Insulin Pen Needle (BD PEN NEEDLE NANO U/F) 32G X 4 MM MISC INJECT SUBCUTANEOUSLY DAILY 90 each 3  . lisinopril (ZESTRIL) 20 MG tablet TAKE 1 TABLET BY MOUTH AT  BEDTIME 90 tablet 3  . metFORMIN (GLUCOPHAGE) 1000 MG tablet TAKE 1 TABLET BY MOUTH  TWICE DAILY WITH MEALS 180 tablet 3  . metoprolol tartrate (LOPRESSOR) 50 MG tablet TAKE 1 TABLET BY MOUTH  TWICE DAILY 180 tablet 3  . Multiple Vitamins-Minerals (MULTIVITAMIN WITH MINERALS) tablet Take 1 tablet by mouth daily.    . Multiple Vitamins-Minerals (PRESERVISION AREDS 2+MULTI VIT) CAPS Take 1 capsule by mouth 2 (two) times daily at 10 AM and 5 PM. **Contains 6 mg of Vitamin B 6**    . pyridOXINE (VITAMIN B-6) 25 MG tablet Take 25 mg by mouth daily.    . rosuvastatin (CRESTOR) 10 MG tablet Take 1 tablet (10 mg total) by mouth daily. 90 tablet 3  . TOUJEO SOLOSTAR 300 UNIT/ML Solostar Pen INJECT 38 UNITS INTO THE  SKIN AT BEDTIME 13.5 mL 3    . umeclidinium bromide (INCRUSE ELLIPTA) 62.5 MCG/INH AEPB Inhale 1 puff into the lungs daily. 90 each 3  . valACYclovir (VALTREX) 1000 MG tablet TAKE ONE-HALF TABLET BY  MOUTH TWICE DAILY FOR  5  DAYS AS NEEDED FOR OUTBREAK 90 tablet 3  . gabapentin (NEURONTIN) 300 MG capsule Take 1 capsule (300 mg total) by mouth at bedtime. 90 capsule 3   No current facility-administered medications for this visit.    Allergies:   Shellfish allergy, Statins, Atorvastatin, Ezetimibe, Flomax [tamsulosin hcl], Prednisone, and Tizanidine    Social History:  The patient  reports that he quit smoking about 20 years ago. He has a 80.00 pack-year smoking history. His smokeless tobacco use includes chew. He reports that he does not drink  alcohol and does not use drugs.   Family History:  The patient's family history includes Cancer in his cousin, daughter, daughter, and maternal uncle; Colon cancer in his maternal uncle; Diabetes in his mother; Heart disease in his father and mother; Heart failure in his mother; Hypertension in his mother.    ROS:  Please see the history of present illness.   Otherwise, review of systems are positive for none.   All other systems are reviewed and negative.    PHYSICAL EXAM: VS:  BP (!) 156/62   Pulse 73   Ht 5\' 9"  (1.753 m)   Wt 208 lb 3.2 oz (94.4 kg)   SpO2 99%   BMI 30.75 kg/m  , BMI Body mass index is 30.75 kg/m. GEN: Well nourished, well developed, in no acute distress  HEENT: normal  Neck: no JVD, carotid bruits, or masses Cardiac: RRR; no murmurs, rubs, or gallops,no edema  Respiratory:  clear to auscultation bilaterally, normal work of breathing GI: soft, nontender, nondistended, + BS MS: no deformity or atrophy  Skin: warm and dry, no rash Neuro:  Strength and sensation are intact Psych: euthymic mood, full affect Vascular: Femoral pulses +2 on the right and +2 on the left.    EKG:  EKG is not ordered today.    Recent Labs: 01/04/2020: Hemoglobin  12.6; Platelets 233 01/10/2020: ALT 20; BUN 19; Creat 1.39; Potassium 4.7; Sodium 141    Lipid Panel    Component Value Date/Time   CHOL 122 09/28/2019 1056   TRIG 111 09/28/2019 1056   TRIG 202 (HH) 06/05/2006 0828   HDL 50 09/28/2019 1056   CHOLHDL 2.4 09/28/2019 1056   CHOLHDL 4.2 04/08/2019 1551   VLDL 67 (H) 07/20/2015 1003   LDLCALC 52 09/28/2019 1056   LDLCALC 99 04/08/2019 1551   LDLDIRECT 104.2 03/06/2012 0845      Wt Readings from Last 3 Encounters:  05/16/20 208 lb 3.2 oz (94.4 kg)  05/04/20 207 lb 3.2 oz (94 kg)  03/29/20 208 lb (94.3 kg)        No flowsheet data found.    ASSESSMENT AND PLAN:  1.  Peripheral arterial disease : Status post  intravascular lithotripsy and drug-coated balloon angioplasty in the left common femoral artery and left SFA with excellent results.  Most recent vascular studies in October showed patent left common femoral artery and SFA without significant restenosis.  I elected to discontinue clopidogrel and continue aspirin.  2.  Coronary artery disease involving native coronary arteries without angina: Continue medical therapy.  3.  Essential hypertension: Blood pressure is mildly elevated today.  Continue to monitor for now.  4.  Hyperlipidemia: Continue treatment with rosuvastatin.  Most recent lipid profile showed an LDL of 54.  5.  Carotid disease status post right carotid enterectomy.  Most recent Doppler showed mild nonobstructive disease.  6.  Peripheral neuropathy: I suspect that part of his symptoms at night might be related to diabetic peripheral neuropathy.  I elected to add gabapentin 300 mg at bedtime.   Disposition:   FU with me in 6 months  Signed,  Kathlyn Sacramento, MD  05/16/2020 11:37 AM    [place]

## 2020-05-16 NOTE — Patient Instructions (Signed)
Medication Instructions:  STOP the Clopidogrel (Plavix) START Gabapentin 300 mg once daily at bedtime  *If you need a refill on your cardiac medications before your next appointment, please call your pharmacy*   Lab Work: None ordered If you have labs (blood work) drawn today and your tests are completely normal, you will receive your results only by: Marland Kitchen MyChart Message (if you have MyChart) OR . A paper copy in the mail If you have any lab test that is abnormal or we need to change your treatment, we will call you to review the results.   Testing/Procedures: None ordered   Follow-Up: At [facility], you and your health needs are our priority.  As part of our continuing mission to provide you with exceptional heart care, we have created designated Provider Care Teams.  These Care Teams include your primary Cardiologist (physician) and Advanced Practice Providers (APPs -  Physician Assistants and Nurse Practitioners) who all work together to provide you with the care you need, when you need it.  We recommend signing up for the patient portal called "MyChart".  Sign up information is provided on this After Visit Summary.  MyChart is used to connect with patients for Virtual Visits (Telemedicine).  Patients are able to view lab/test results, encounter notes, upcoming appointments, etc.  Non-urgent messages can be sent to your provider as well.   To learn more about what you can do with MyChart, go to [email or web address].    Your next appointment:   6 month(s)  The format for your next appointment:   In Person  Provider:   Kathlyn Sacramento, MD

## 2020-05-22 ENCOUNTER — Encounter: Payer: Self-pay | Admitting: Family Medicine

## 2020-05-22 DIAGNOSIS — E119 Type 2 diabetes mellitus without complications: Secondary | ICD-10-CM | POA: Diagnosis not present

## 2020-05-23 ENCOUNTER — Other Ambulatory Visit: Payer: Self-pay | Admitting: Family Medicine

## 2020-05-23 ENCOUNTER — Other Ambulatory Visit: Payer: Self-pay

## 2020-05-23 ENCOUNTER — Other Ambulatory Visit: Payer: Self-pay | Admitting: Cardiology

## 2020-05-23 ENCOUNTER — Ambulatory Visit (INDEPENDENT_AMBULATORY_CARE_PROVIDER_SITE_OTHER): Payer: Medicare Other | Admitting: Family Medicine

## 2020-05-23 ENCOUNTER — Encounter: Payer: Self-pay | Admitting: Family Medicine

## 2020-05-23 VITALS — BP 145/65 | HR 76 | Ht 69.0 in | Wt 208.0 lb

## 2020-05-23 DIAGNOSIS — E1151 Type 2 diabetes mellitus with diabetic peripheral angiopathy without gangrene: Secondary | ICD-10-CM

## 2020-05-23 DIAGNOSIS — E162 Hypoglycemia, unspecified: Secondary | ICD-10-CM | POA: Diagnosis not present

## 2020-05-23 DIAGNOSIS — G4733 Obstructive sleep apnea (adult) (pediatric): Secondary | ICD-10-CM

## 2020-05-23 DIAGNOSIS — R4182 Altered mental status, unspecified: Secondary | ICD-10-CM

## 2020-05-23 DIAGNOSIS — E7800 Pure hypercholesterolemia, unspecified: Secondary | ICD-10-CM

## 2020-05-23 LAB — GLUCOSE, POCT (MANUAL RESULT ENTRY): POC Glucose: 200 mg/dl — AB (ref 70–99)

## 2020-05-23 NOTE — Progress Notes (Signed)
Established Patient Office Visit  Subjective:  Patient ID: Clayton Harrison, male    DOB: [birth year]  Age: 86 y.o. MRN: [id number]  CC:  Chief Complaint  Patient presents with  . Diabetes  . Fatigue    HPI Clayton Harrison presents for low blood sugars.  His daughter had reached out to [place] via MyChart to let us know that he has been experiencing some low blood sugars.  He was also feeling symptomatic with it feeling shaky, irritable and feeling a little "out of it".  He currently takes Metformin 1000 mg twice a day, glipizide 10 mg twice a day before meals and Toujeo 38 units daily.  He for started noticing the lows on Tuesday, November 23.  Sugars have dropped as low as 50.  He did check his strips and found out that they were actually expired in 2020 and is getting new blood test strips sent to his office.  He reports he did decrease his Toujeo down to 35 units about 6 days ago on Thanksgiving day.  Also reports that he had an episode where he has a lapse in memory he was sitting in the car waiting on his wife who was at the pharmacy and he says the next thing he knows he remembers sitting in the restaurant and her asking him what he wanted to order.  Because he had not eaten all day by then it was probably 2 or 3:00 in the afternoon.  His wife says that he just seemed quiet but he seemed alert.  He does not remember traveling in the car to the restaurant.  They have been checking his blood sugars on and off since then and he has had some significant lows.  Started on gabapentin 300 mg capsule at bedtime for neuropathy he feels like it has been helpful.  He was taken off of the Plavix.  I decided to wean his Cymbalta to see if it was really effective for him.  He really did not notice a big difference between 60 mg and 30 mg so we continue to taper him off.  He reports that since coming completely off the medication his leg cramps have resolved and his back pain has actually improved.    Past  Medical History:  Diagnosis Date  . Adenomatous colon polyp    2.5cm excised by right colectomy 2011  . Anemia, blood loss    history of  . Blockage of coronary artery of heart ([place]) 2001   x 4  . CAD (coronary artery disease)   . Carotid artery occlusion   . Cataract   . Cerebrovascular disease, unspecified   . Colon tumor 03/2010   right ascending  . Diabetic retinopathy ([place]) 09/29/2017  . DM (diabetes mellitus) ([place])   . Elevated PSA   . GERD (gastroesophageal reflux disease)   . Hearing loss   . HTN (hypertension)   . Hypercholesteremia   . Interstitial cystitis   . Macular degeneration   . Other and unspecified hyperlipidemia   . Prostate cancer ([place]) 03/09/14   Gleason 4+5=9, volume 98 mL  . PVD (peripheral vascular disease) ([place])   . S/P radiation therapy 07/20/2014 through 09/13/2014    Prostate 7800 cGy in 40 sessions, seminal vesicles 5600 cGy in 40 sessions   . SBO (small bowel obstruction) ([place]) 07/2013  . Skin cancer 2012   melanoma upper back, removed  . Statin intolerance   . Vertigo   . Vertigo  Past Surgical History:  Procedure Laterality Date  . ABDOMINAL AORTOGRAM W/LOWER EXTREMITY N/A 10/06/2019   Procedure: ABDOMINAL AORTOGRAM W/LOWER EXTREMITY;  Surgeon: Wellington Hampshire, MD;  Location: [facility];  Service: Cardiovascular;  Laterality: N/A;  . APPENDECTOMY  05/2010   with right hemicolectomy  . CARDIAC CATHETERIZATION  11/1999  . CAROTID ENDARTERECTOMY  2012   right  . CORONARY ARTERY BYPASS GRAFT  2001   4  . dental extractions Bilateral 1980  . DENTAL SURGERY    . [place]  . left carpal tunnel release  2009  . PARTIAL COLECTOMY  05/24/2010   Lap converted to open right proximal colectomy  . PERIPHERAL VASCULAR INTERVENTION  10/06/2019   Procedure: PERIPHERAL VASCULAR INTERVENTION;  Surgeon: Wellington Hampshire, MD;  Location: [facility];  Service: Cardiovascular;;  left lower extremity  . PROSTATE BIOPSY  03/09/14   Gleason 9, vol 98 mL  . SKIN CANCER EXCISION  02/2011   upper back  . TONSILLECTOMY  1940  . URETEROLITHOTOMY      Family History  Problem Relation Age of Onset  . Diabetes Mother   . Heart disease Mother   . Heart failure Mother   . Hypertension Mother   . Heart disease Father   . Cancer Cousin        colon  . Colon cancer Maternal Uncle        x4  . Cancer Maternal Uncle        colon  . Cancer Daughter        breast  . Cancer Daughter        breast    Social History   Socioeconomic History  . Marital status: Married    Spouse name: [name]  . Number of children: 2  . Years of education: HS  . Highest education level: Not on file  Occupational History  . Occupation: Retired  Tobacco Use  . Smoking status: Former Smoker    Packs/day: 2.00    Years: 40.00    Pack years: 80.00    Quit date: 11/23/1999    Years since quitting: 20.5  . Smokeless tobacco: Current User    Types: Chew  Substance and Sexual Activity  . Alcohol use: No  . Drug use: No  . Sexual activity: Yes    Partners: Female  Other Topics Concern  . Not on file  Social History Narrative   No regular exercise. Daily caffeine- 3-4 cups daily         Social Determinants of Health   Financial Resource Strain:   . Difficulty of Paying Living Expenses: Not on file  Food Insecurity:   . Worried About Charity fundraiser in the Last Year: Not on file  . Ran Out of Food in the Last Year: Not on file  Transportation Needs:   . Lack of Transportation (Medical): Not on file  . Lack of Transportation (Non-Medical): Not on file  Physical Activity:   . Days of Exercise per Week: Not on file  . Minutes of Exercise per Session: Not on file  Stress:   . Feeling of Stress : Not on file  Social Connections:   . Frequency of Communication with Friends and Family: Not on file  . Frequency of Social Gatherings with Friends and  Family: Not on file  . Attends Religious Services: Not on file  . Active Member of Clubs or Organizations: Not on file  .  Attends Archivist Meetings: Not on file  . Marital Status: Not on file  Intimate Partner Violence:   . Fear of Current or Ex-Partner: Not on file  . Emotionally Abused: Not on file  . Physically Abused: Not on file  . Sexually Abused: Not on file    Outpatient Medications Prior to Visit  Medication Sig Dispense Refill  . Albuterol Sulfate (PROAIR RESPICLICK) 409 (90 Base) MCG/ACT AEPB Inhale 2 puffs into the lungs every 6 (six) hours as needed. 3 each 4  . amLODipine (NORVASC) 5 MG tablet Take 1 tablet (5 mg total) by mouth daily. 90 tablet 1  . aspirin EC 81 MG tablet Take 1 tablet (81 mg total) by mouth daily. 90 tablet 3  . gabapentin (NEURONTIN) 300 MG capsule Take 1 capsule (300 mg total) by mouth at bedtime. 10 capsule 0  . Insulin Pen Needle (BD PEN NEEDLE NANO U/F) 32G X 4 MM MISC INJECT SUBCUTANEOUSLY DAILY 90 each 3  . lisinopril (ZESTRIL) 20 MG tablet TAKE 1 TABLET BY MOUTH AT  BEDTIME 90 tablet 3  . metFORMIN (GLUCOPHAGE) 1000 MG tablet TAKE 1 TABLET BY MOUTH  TWICE DAILY WITH MEALS 180 tablet 3  . metoprolol tartrate (LOPRESSOR) 50 MG tablet TAKE 1 TABLET BY MOUTH  TWICE DAILY 180 tablet 3  . Multiple Vitamins-Minerals (PRESERVISION AREDS 2+MULTI VIT) CAPS Take 1 capsule by mouth 2 (two) times daily at 10 AM and 5 PM. **Contains 6 mg of Vitamin B 6**    . pyridOXINE (VITAMIN B-6) 25 MG tablet Take 25 mg by mouth daily.    . rosuvastatin (CRESTOR) 10 MG tablet TAKE 1 TABLET BY MOUTH  DAILY 90 tablet 3  . TOUJEO SOLOSTAR 300 UNIT/ML Solostar Pen INJECT 38 UNITS INTO THE  SKIN AT BEDTIME 13.5 mL 3  . umeclidinium bromide (INCRUSE ELLIPTA) 62.5 MCG/INH AEPB Inhale 1 puff into the lungs daily. 90 each 3  . valACYclovir (VALTREX) 1000 MG tablet TAKE ONE-HALF TABLET BY  MOUTH TWICE DAILY FOR  5  DAYS AS NEEDED FOR OUTBREAK 90 tablet 3  . glipiZIDE  (GLUCOTROL) 10 MG tablet TAKE 1 TABLET BY MOUTH  TWICE DAILY BEFORE MEALS 180 tablet 3  . Multiple Vitamins-Minerals (MULTIVITAMIN WITH MINERALS) tablet Take 1 tablet by mouth daily.     No facility-administered medications prior to visit.    Allergies  Allergen Reactions  . Shellfish Allergy Anaphylaxis    Swelling of the throat  . Statins Other (See Comments)    Myalgias, urinary frequency  . Cymbalta [Duloxetine Hcl] Other (See Comments)    Leg cramps and back pain  . Atorvastatin Other (See Comments)    urinary frequency, palpitations  . Ezetimibe Other (See Comments)    "hip problems"  . Flomax [Tamsulosin Hcl] Other (See Comments)    Complains of dizziness  . Prednisone Other (See Comments)    Crazy dreams, insomnia  . Tizanidine Other (See Comments)    Insomnia, nightmares    ROS Review of Systems    Objective:    Physical Exam Vitals reviewed.  Constitutional:      Appearance: He is well-developed.  HENT:     Head: Normocephalic and atraumatic.  Eyes:     Conjunctiva/sclera: Conjunctivae normal.  Cardiovascular:     Heart sounds: Normal heart sounds.  Pulmonary:     Effort: Pulmonary effort is normal.  Skin:    General: Skin is warm and dry.     Coloration: Skin is not pale.  Neurological:     Mental Status: He is alert and oriented to person, place, and time.  Psychiatric:        Behavior: Behavior normal.     BP (!) 145/65   Pulse 76   Ht 5\' 9"  (1.753 m)   Wt 208 lb (94.3 kg)   SpO2 96%   BMI 30.72 kg/m  Wt Readings from Last 3 Encounters:  05/23/20 208 lb (94.3 kg)  05/16/20 208 lb 3.2 oz (94.4 kg)  05/04/20 207 lb 3.2 oz (94 kg)     There are no preventive care reminders to display for this patient.  There are no preventive care reminders to display for this patient.  Lab Results  Component Value Date   TSH 2.02 01/05/2019   Lab Results  Component Value Date   WBC 8.3 01/04/2020   HGB 12.6 (L) 01/04/2020   HCT 39.0 01/04/2020    MCV 88.8 01/04/2020   PLT 233 01/04/2020   Lab Results  Component Value Date   NA 141 01/10/2020   K 4.7 01/10/2020   CO2 26 01/10/2020   GLUCOSE 173 (H) 01/10/2020   BUN 19 01/10/2020   CREATININE 1.39 (H) 01/10/2020   BILITOT 0.4 01/10/2020   ALKPHOS 69 09/28/2019   AST 20 01/10/2020   ALT 20 01/10/2020   PROT 6.6 01/10/2020   ALBUMIN 4.5 09/28/2019   CALCIUM 9.2 01/10/2020   ANIONGAP 11 06/23/2019   GFR 59.22 (L) 03/06/2012   Lab Results  Component Value Date   CHOL 122 09/28/2019   Lab Results  Component Value Date   HDL 50 09/28/2019   Lab Results  Component Value Date   LDLCALC 52 09/28/2019   Lab Results  Component Value Date   TRIG 111 09/28/2019   Lab Results  Component Value Date   CHOLHDL 2.4 09/28/2019   Lab Results  Component Value Date   HGBA1C 6.2 (A) 03/21/2020      Assessment & Plan:   Problem List Items Addressed This Visit      Cardiovascular and Mediastinum   DM (diabetes mellitus) with peripheral vascular complication ([place])    Having hypoglycemic events.  Number to have him hold the glipizide which is the most likely culprit.  He also fast for very extended period from dinnertime in the evening until sometimes 1 or 2:00 in the afternoon he will just have black coffee in the mornings.  Discussed that eating a small amount of food or breakfast in the morning and holding the glipizide.  He should be getting in his new test strips within the next day or 2 so we can also get a more accurate reading to see what his blood sugars are doing.  So encouraged to make sure that he is getting some protein with each meal to help balance his blood sugar levels.        Respiratory   OSA (obstructive sleep apnea)    Far doing well with CPAP has been able to wear for about 4 hours each night.  In fact he says the last 2 nights he was able to wear it for 5 hours is just been a bit of a struggle.  He did notice that he did not dream as much which he says  is actually been great.  Still falling asleep easily during the daytime so has not noticed a big improvement there.       Other Visit Diagnoses    Hypoglycemia    -  Primary   Relevant Orders   POCT glucose (manual entry) (Completed)   Altered mental status, unspecified altered mental status type         Medication side effect-added Cymbalta to intolerance list.  Altered mental status, transient-unclear etiology I am suspecting that it was hypoglycemic related since he has been having some hypoglycemic episodes that started over the last week again unclear why it has been affected now he is actually been on glipizide for years but certainly it could be a culprit is working illuminate that.  He is also can get some up-to-date glucometer strips so he can better verify his blood glucose levels.  Consider alternative causes such as stroke etc. but his wife says she did not notice any other abnormal behavior such as slurred speech, weakness gait instability etc.  No orders of the defined types were placed in this encounter.   Follow-up: No follow-ups on file.   I spent 32 minutes on the day of the encounter to include pre-visit record review, face-to-face time with the patient and post visit ordering of test.    [name], MD

## 2020-05-23 NOTE — Assessment & Plan Note (Signed)
Far doing well with CPAP has been able to wear for about 4 hours each night.  In fact he says the last 2 nights he was able to wear it for 5 hours is just been a bit of a struggle.  He did notice that he did not dream as much which he says is actually been great.  Still falling asleep easily during the daytime so has not noticed a big improvement there.

## 2020-05-23 NOTE — Assessment & Plan Note (Signed)
Having hypoglycemic events.  Number to have him hold the glipizide which is the most likely culprit.  He also fast for very extended period from dinnertime in the evening until sometimes 1 or 2:00 in the afternoon he will just have black coffee in the mornings.  Discussed that eating a small amount of food or breakfast in the morning and holding the glipizide.  He should be getting in his new test strips within the next day or 2 so we can also get a more accurate reading to see what his blood sugars are doing.  So encouraged to make sure that he is getting some protein with each meal to help balance his blood sugar levels.

## 2020-05-23 NOTE — Patient Instructions (Addendum)
Hold the glipizide completely for the next week.  I wanted to check your blood sugars fasting in the morning you can do it while you are drinking your coffee in the morning.  Check it again before bedtime and then send me those numbers at the end of the week so we can take a look to see what we might need to do.  We may be able to restart a lower dose of the glipizide such as 5 mg but will just have to see.  Try to eat a little bit of something in the morning, midday and then in the evening.  Okay to keep the Toujeo at the lower dose of 35 units daily.  If you are still noticing blood sugars less than 80 please call me back sooner rather than later.

## 2020-05-28 ENCOUNTER — Other Ambulatory Visit: Payer: Self-pay | Admitting: Family Medicine

## 2020-06-06 ENCOUNTER — Telehealth: Payer: Self-pay

## 2020-06-06 NOTE — Telephone Encounter (Signed)
Teagan called with fasting blood sugar numbers.   Thursday 122 mg/dl Friday 122 mg/dl Saturday 98 mg/dl Sunday 92 mg/dl Monday 100 mg/dl Tuesday 108 mg/dl

## 2020-06-06 NOTE — Telephone Encounter (Signed)
Wow!  These look absolutely gorgeous.  Is he having any lows during the day or at night?  He can go ahead and decrease his Toujeo down to 32 units as well.  I believe he still giving 35 units and holding his glipizide.  Okay to go ahead and remove glipizide from medication list.

## 2020-06-07 NOTE — Telephone Encounter (Signed)
Patient advised. Glipizide has been removed.

## 2020-06-07 NOTE — Telephone Encounter (Signed)
Left message for a return call

## 2020-06-09 ENCOUNTER — Ambulatory Visit (HOSPITAL_COMMUNITY)
Admission: EM | Admit: 2020-06-09 | Discharge: 2020-06-09 | Disposition: A | Discharge status: Home / Self Care | Payer: Medicare Other | Attending: Family Medicine | Admitting: Family Medicine

## 2020-06-09 ENCOUNTER — Other Ambulatory Visit: Payer: Self-pay

## 2020-06-09 ENCOUNTER — Encounter (HOSPITAL_COMMUNITY): Payer: Self-pay

## 2020-06-09 DIAGNOSIS — L02212 Cutaneous abscess of back [any part, except buttock]: Secondary | ICD-10-CM | POA: Diagnosis not present

## 2020-06-09 DIAGNOSIS — R5381 Other malaise: Secondary | ICD-10-CM | POA: Insufficient documentation

## 2020-06-09 DIAGNOSIS — R222 Localized swelling, mass and lump, trunk: Secondary | ICD-10-CM | POA: Insufficient documentation

## 2020-06-09 DIAGNOSIS — R220 Localized swelling, mass and lump, head: Secondary | ICD-10-CM | POA: Diagnosis not present

## 2020-06-09 LAB — CBC WITH DIFFERENTIAL/PLATELET
Abs Immature Granulocytes: 0.03 10*3/uL (ref 0.00–0.07)
Basophils Absolute: 0 10*3/uL (ref 0.0–0.1)
Basophils Relative: 0 %
Eosinophils Absolute: 0 10*3/uL (ref 0.0–0.5)
Eosinophils Relative: 0 %
HCT: 36.3 % — ABNORMAL LOW (ref 39.0–52.0)
Hemoglobin: 11.4 g/dL — ABNORMAL LOW (ref 13.0–17.0)
Immature Granulocytes: 0 %
Lymphocytes Relative: 14 %
Lymphs Abs: 1.3 10*3/uL (ref 0.7–4.0)
MCH: 28.6 pg (ref 26.0–34.0)
MCHC: 31.4 g/dL (ref 30.0–36.0)
MCV: 91.2 fL (ref 80.0–100.0)
Monocytes Absolute: 0.9 10*3/uL (ref 0.1–1.0)
Monocytes Relative: 10 %
Neutro Abs: 7.3 10*3/uL (ref 1.7–7.7)
Neutrophils Relative %: 76 %
Platelets: 196 10*3/uL (ref 150–400)
RBC: 3.98 MIL/uL — ABNORMAL LOW (ref 4.22–5.81)
RDW: 14 % (ref 11.5–15.5)
WBC: 9.6 10*3/uL (ref 4.0–10.5)
nRBC: 0 % (ref 0.0–0.2)

## 2020-06-09 LAB — COMPREHENSIVE METABOLIC PANEL
ALT: 43 U/L (ref 0–44)
AST: 154 U/L — ABNORMAL HIGH (ref 15–41)
Albumin: 3.9 g/dL (ref 3.5–5.0)
Alkaline Phosphatase: 58 U/L (ref 38–126)
Anion gap: 14 (ref 5–15)
BUN: 30 mg/dL — ABNORMAL HIGH (ref 8–23)
CO2: 20 mmol/L — ABNORMAL LOW (ref 22–32)
Calcium: 9.2 mg/dL (ref 8.9–10.3)
Chloride: 102 mmol/L (ref 98–111)
Creatinine, Ser: 1.88 mg/dL — ABNORMAL HIGH (ref 0.61–1.24)
GFR, Estimated: 34 mL/min — ABNORMAL LOW (ref >60)
Glucose, Bld: 203 mg/dL — ABNORMAL HIGH (ref 70–99)
Potassium: 5.2 mmol/L — ABNORMAL HIGH (ref 3.5–5.1)
Sodium: 136 mmol/L (ref 135–145)
Total Bilirubin: 1.2 mg/dL (ref 0.3–1.2)
Total Protein: 7 g/dL (ref 6.5–8.1)

## 2020-06-09 LAB — POCT URINALYSIS DIPSTICK, ED / UC
Bilirubin Urine: NEGATIVE
Glucose, UA: NEGATIVE mg/dL
Hgb urine dipstick: NEGATIVE
Ketones, ur: 15 mg/dL — AB
Leukocytes,Ua: NEGATIVE
Nitrite: NEGATIVE
Protein, ur: 100 mg/dL — AB
Specific Gravity, Urine: >=1.03 (ref 1.005–1.030)
Urobilinogen, UA: 0.2 mg/dL (ref 0.0–1.0)
pH: 5 (ref 5.0–8.0)

## 2020-06-09 MED ORDER — LIDOCAINE-EPINEPHRINE 1 %-1:100000 IJ SOLN
INTRAMUSCULAR | Status: AC
  Filled 2020-06-09: qty 1

## 2020-06-09 MED ORDER — DOXYCYCLINE HYCLATE 100 MG PO TABS: 100.0000 mg | ORAL_TABLET | Freq: Two times a day (BID) | ORAL | 0 refills | Status: AC

## 2020-06-09 NOTE — ED Triage Notes (Signed)
Pt in with c/o abscess on left side of back that is painful that he noticed 2 days ago. Also c/o some sob that he noticed yesterday.  Pt has been applying bio Freeze to area with some relief

## 2020-06-10 DIAGNOSIS — R404 Transient alteration of awareness: Secondary | ICD-10-CM | POA: Diagnosis not present

## 2020-06-10 DIAGNOSIS — E1165 Type 2 diabetes mellitus with hyperglycemia: Secondary | ICD-10-CM | POA: Diagnosis not present

## 2020-06-10 DIAGNOSIS — R0602 Shortness of breath: Secondary | ICD-10-CM | POA: Diagnosis not present

## 2020-06-10 DIAGNOSIS — I499 Cardiac arrhythmia, unspecified: Secondary | ICD-10-CM | POA: Diagnosis not present

## 2020-06-13 NOTE — ED Provider Notes (Addendum)
[place]    CSN: [id number] Arrival date & time: 06/09/20  1522      History   Chief Complaint Chief Complaint  Patient presents with  . Abscess    HPI Clayton Harrison is a 86 y.o. male.   Patient presenting today with wife for pain in upper back from a mass that he believes to be a cyst as he's been dealing with numerous places like this the last few years. He has an appt with a Education officer, environmental after Christmas but states he wants to see if the area can be drained today as the pain is keeping him up at night. Denies fever, chills, redness to the area, drainage. He also c/o overall feeling tired the past week or so without specific complaints toward this other than some intermittent SOB which he and his wife states is not abnormal for him with his hx of COPD. He feels it may be related to not sleeping well due to the pain from this area on his back.      Past Medical History:  Diagnosis Date  . Adenomatous colon polyp    2.5cm excised by right colectomy 2011  . Anemia, blood loss    history of  . Blockage of coronary artery of heart ([place]) 2001   x 4  . CAD (coronary artery disease)   . Carotid artery occlusion   . Cataract   . Cerebrovascular disease, unspecified   . Colon tumor 03/2010   right ascending  . Diabetic retinopathy ([place]) 09/29/2017  . DM (diabetes mellitus) ([place])   . Elevated PSA   . GERD (gastroesophageal reflux disease)   . Hearing loss   . HTN (hypertension)   . Hypercholesteremia   . Interstitial cystitis   . Macular degeneration   . Other and unspecified hyperlipidemia   . Prostate cancer ([place]) 03/09/14   Gleason 4+5=9, volume 98 mL  . PVD (peripheral vascular disease) ([place])   . S/P radiation therapy 07/20/2014 through 09/13/2014    Prostate 7800 cGy in 40 sessions, seminal vesicles 5600 cGy in 40 sessions   . SBO (small bowel obstruction) ([place]) 07/2013  . Skin  cancer 2012   melanoma upper back, removed  . Statin intolerance   . Vertigo   . Vertigo     Patient Active Problem List   Diagnosis Date Noted  . OSA (obstructive sleep apnea) 05/23/2020  . Weakness of both lower extremities 08/19/2019  . Gait instability 04/08/2019  . Dizziness 01/05/2019  . Herpes simplex viral infection 06/30/2018  . Sebaceous cyst 06/30/2018  . Chronic obstructive pulmonary disease ([place]) 04/17/2017  . Restrictive lung disease 04/17/2017  . Aortic atherosclerosis ([place]) 03/26/2016  . Carpal tunnel syndrome, right 11/16/2015  . Primary osteoarthritis of right wrist 11/16/2015  . Spinal stenosis of lumbar region 01/17/2015  . Chronic kidney disease (CKD) stage G2/A2, mildly decreased glomerular filtration rate (GFR) between 60-89 mL/min/1.73 square meter and albuminuria creatinine ratio between 30-299 mg/g 05/27/2014  . H/O right hemicolectomy 08/06/2013  . Metabolic acidosis 123456  . HOH (hard of hearing) 08/06/2013  . History of small bowel obstruction 08/06/2013  . Palpitations 10/27/2012  . BPH (benign prostatic hyperplasia) 10/15/2012  . Cataracts, bilateral 08/13/2012  . Macular degeneration 08/13/2012  . Overweight (BMI 25.0-29.9) 08/13/2012  . Carotid artery occlusion without infarction 08/13/2011  . Statin intolerance 03/20/2011  . ANEMIA-UNSPECIFIED 02/15/2010  . GERD 02/15/2010  . Peripheral vascular disease ([place]) 06/02/2009  . Cerebrovascular disease 12/10/2008  .  DM (diabetes mellitus) with peripheral vascular complication ([place]) 123XX123  . Hyperlipemia 01/16/2007  . Essential hypertension 01/16/2007  . Coronary atherosclerosis 01/16/2007  . INTERSTITIAL CYSTITIS 01/16/2007  . Prostatic adenocarcinoma ([place]) 01/16/2007    Past Surgical History:  Procedure Laterality Date  . ABDOMINAL AORTOGRAM W/LOWER EXTREMITY N/A 10/06/2019   Procedure: ABDOMINAL AORTOGRAM W/LOWER EXTREMITY;  Surgeon: Wellington Hampshire, MD;  Location: [facility];  Service: Cardiovascular;  Laterality: N/A;  . APPENDECTOMY  05/2010   with right hemicolectomy  . CARDIAC CATHETERIZATION  11/1999  . CAROTID ENDARTERECTOMY  2012   right  . CORONARY ARTERY BYPASS GRAFT  2001   4  . dental extractions Bilateral 1980  . DENTAL SURGERY    . [place]  . left carpal tunnel release  2009  . PARTIAL COLECTOMY  05/24/2010   Lap converted to open right proximal colectomy  . PERIPHERAL VASCULAR INTERVENTION  10/06/2019   Procedure: PERIPHERAL VASCULAR INTERVENTION;  Surgeon: Wellington Hampshire, MD;  Location: [facility];  Service: Cardiovascular;;  left lower extremity  . PROSTATE BIOPSY  03/09/14   Gleason 9, vol 98 mL  . SKIN CANCER EXCISION  02/2011   upper back  . TONSILLECTOMY  1940  . URETEROLITHOTOMY         Home Medications    Prior to Admission medications   Medication Sig Start Date End Date Taking? Authorizing Provider  Albuterol Sulfate (PROAIR RESPICLICK) 123XX123 (90 Base) MCG/ACT AEPB Inhale 2 puffs into the lungs every 6 (six) hours as needed. 10/18/19   Hali Marry, MD  amLODipine (NORVASC) 5 MG tablet TAKE 1 TABLET BY MOUTH  DAILY 05/29/20   Hali Marry, MD  aspirin EC 81 MG tablet Take 1 tablet (81 mg total) by mouth daily. 07/06/19   Lelon Perla, MD  clopidogrel (PLAVIX) 75 MG tablet Take 75 mg by mouth daily. 06/03/20   [provider]  doxycycline (VIBRA-TABS) 100 MG tablet Take 1 tablet (100 mg total) by mouth 2 (two) times daily. 06/09/20   Volney American, PA-C  DULoxetine (CYMBALTA) 60 MG capsule TAKE 1 CAPSULE BY MOUTH  DAILY 05/23/20   Hali Marry, MD  gabapentin (NEURONTIN) 300 MG capsule Take 1 capsule (300 mg total) by mouth at bedtime. 05/16/20   Wellington Hampshire, MD  Insulin Pen Needle (BD PEN NEEDLE NANO U/F) 32G X 4 MM MISC INJECT SUBCUTANEOUSLY DAILY 11/25/19   Hali Marry, MD  lisinopril (ZESTRIL) 20 MG tablet TAKE 1 TABLET BY MOUTH AT   BEDTIME 04/24/20   Hali Marry, MD  metFORMIN (GLUCOPHAGE) 1000 MG tablet TAKE 1 TABLET BY MOUTH  TWICE DAILY WITH MEALS 04/24/20   Hali Marry, MD  metoprolol tartrate (LOPRESSOR) 50 MG tablet TAKE 1 TABLET BY MOUTH  TWICE DAILY 12/09/19   Hali Marry, MD  Multiple Vitamins-Minerals (PRESERVISION AREDS 2+MULTI VIT) CAPS Take 1 capsule by mouth 2 (two) times daily at 10 AM and 5 PM. **Contains 6 mg of Vitamin B 6** 12/13/19   Hali Marry, MD  pyridOXINE (VITAMIN B-6) 25 MG tablet Take 25 mg by mouth daily.    [provider]  rosuvastatin (CRESTOR) 10 MG tablet TAKE 1 TABLET BY MOUTH  DAILY 05/23/20   Lelon Perla, MD  TOUJEO SOLOSTAR 300 UNIT/ML Solostar Pen INJECT 38 UNITS INTO THE  SKIN AT BEDTIME 09/30/19   Hali Marry, MD  umeclidinium bromide (INCRUSE ELLIPTA)  62.5 MCG/INH AEPB Inhale 1 puff into the lungs daily. 01/05/19   Hali Marry, MD  valACYclovir (VALTREX) 1000 MG tablet TAKE ONE-HALF TABLET BY  MOUTH TWICE DAILY FOR  5  DAYS AS NEEDED FOR OUTBREAK 05/08/20   Hali Marry, MD    Family History Family History  Problem Relation Age of Onset  . Diabetes Mother   . Heart disease Mother   . Heart failure Mother   . Hypertension Mother   . Heart disease Father   . Cancer Cousin        colon  . Colon cancer Maternal Uncle        x4  . Cancer Maternal Uncle        colon  . Cancer Daughter        breast  . Cancer Daughter        breast    Social History Social History   Tobacco Use  . Smoking status: Former Smoker    Packs/day: 2.00    Years: 40.00    Pack years: 80.00    Quit date: 11/23/1999    Years since quitting: 20.5  . Smokeless tobacco: Current User    Types: Chew  Substance Use Topics  . Alcohol use: No  . Drug use: No     Allergies   Shellfish allergy, Statins, Cymbalta [duloxetine hcl], Atorvastatin, Ezetimibe, Flomax [tamsulosin hcl], Prednisone, and Tizanidine   Review of  Systems Review of Systems PER HPI    Physical Exam Triage Vital Signs ED Triage Vitals  Enc Vitals Group     BP 06/09/20 1551 (!) 119/59     Pulse Rate 06/09/20 1551 82     Resp 06/09/20 1551 17     Temp 06/09/20 1551 98.8 F (37.1 C)     Temp Source 06/09/20 1551 Oral     SpO2 06/09/20 1551 97 %     Weight --      Height --      Head Circumference --      Peak Flow --      Pain Score 06/09/20 1548 7     Pain Loc --      Pain Edu? --      Excl. in [place]? --    No data found.  Updated Vital Signs BP (!) 119/59 (BP Location: Right Arm)   Pulse 82   Temp 98.8 F (37.1 C) (Oral)   Resp 17   SpO2 97%   Visual Acuity Right Eye Distance:   Left Eye Distance:   Bilateral Distance:    Right Eye Near:   Left Eye Near:    Bilateral Near:     Physical Exam Vitals and nursing note reviewed.  Constitutional:      Appearance: Normal appearance. He is not ill-appearing.  HENT:     Head: Atraumatic.     Mouth/Throat:     Mouth: Mucous membranes are moist.     Pharynx: Oropharynx is clear.  Eyes:     Extraocular Movements: Extraocular movements intact.     Conjunctiva/sclera: Conjunctivae normal.     Pupils: Pupils are equal, round, and reactive to light.  Cardiovascular:     Rate and Rhythm: Normal rate.     Pulses: Normal pulses.     Heart sounds: Normal heart sounds.  Pulmonary:     Effort: Pulmonary effort is normal. No respiratory distress.     Breath sounds: Normal breath sounds. No wheezing or rales.  Comments: Breathing comfortably during interview and exam, speaking in full sentences without labored breathing Abdominal:     General: Bowel sounds are normal. There is no distension.     Palpations: Abdomen is soft.     Tenderness: There is no abdominal tenderness. There is no guarding.  Musculoskeletal:        General: Normal range of motion.     Cervical back: Normal range of motion and neck supple.     Comments: Ambulating without difficulty, able to  walk to and from bathroom without difficulty and able to independently lay onto exam table for procedure and back off into chair  Skin:    General: Skin is warm and dry.     Comments: Several old surgical scars from cyst excisions on back, some with areas of ttp and mild edema 2.5 - 3 cm diameter area of soft superficial mass left upper shoulder, significantly ttp, non erythematous and not actively draining  Neurological:     Mental Status: He is alert.     Motor: No weakness.     Gait: Gait normal.     Comments: AAO x 3, cognition at baseline per patient's wife  Psychiatric:        Mood and Affect: Mood normal.        Behavior: Behavior normal.        Thought Content: Thought content normal.        Judgment: Judgment normal.      UC Treatments / Results  Labs (all labs ordered are listed, but only abnormal results are displayed) Labs Reviewed  CBC WITH DIFFERENTIAL/PLATELET - Abnormal; Notable for the following components:      Result Value   RBC 3.98 (*)    Hemoglobin 11.4 (*)    HCT 36.3 (*)    All other components within normal limits  COMPREHENSIVE METABOLIC PANEL - Abnormal; Notable for the following components:   Potassium 5.2 (*)    CO2 20 (*)    Glucose, Bld 203 (*)    BUN 30 (*)    Creatinine, Ser 1.88 (*)    AST 154 (*)    GFR, Estimated 34 (*)    All other components within normal limits  POCT URINALYSIS DIPSTICK, ED / UC - Abnormal; Notable for the following components:   Ketones, ur 15 (*)    Protein, ur 100 (*)    All other components within normal limits    EKG   Radiology No results found.  Procedures Incision and Drainage  Date/Time: 06/09/2020 6:00 PM Performed by: Volney American, PA-C Authorized by: Volney American, PA-C   Consent:    Consent obtained:  Verbal   Consent given by:  Patient   Risks, benefits, and alternatives were discussed: yes     Risks discussed:  Bleeding, incomplete drainage, pain, infection and damage  to other organs   Alternatives discussed:  Alternative treatment Universal protocol:    Procedure explained and questions answered to patient or proxy's satisfaction: yes     Relevant documents present and verified: yes     Test results available and properly labeled: n/a.     Imaging studies available: n/a.     Required blood products, implants, devices, and special equipment available: n/a.     Site/side marked: yes     Immediately prior to procedure, a time out was called: yes     Patient identity confirmed:  Verbally with patient and arm band Location:    Type:  Cyst   Size:  3 cm   Location:  Trunk   Trunk location:  Back Pre-procedure details:    Skin preparation:  Antiseptic wash Anesthesia:    Anesthesia method:  Local infiltration   Local anesthetic:  Lidocaine 2% WITH epi Procedure type:    Complexity:  Simple Procedure details:    Ultrasound guidance: no     Needle aspiration: no     Incision types:  Stab incision   Incision depth:  Dermal   Wound management:  Probed and deloculated   Drainage characteristics: none.   Drainage amount: none.   Wound treatment:  Wound left open   Packing materials:  None Post-procedure details:    Procedure completion:  Tolerated well, no immediate complications   (including critical care time)  Medications Ordered in UC Medications - No data to display  Initial Impression / Assessment and Plan / UC Course  I have reviewed the triage vital signs and the nursing notes.  Pertinent labs & imaging results that were available during my care of the patient were reviewed by me and considered in my medical decision making (see chart for details).     Had discussed with patient that the mass on his back did not seem to be infected and that I did not feel opening the area at this time is necessary, he urged attempt so I and D was attempted. Feel the area may be a growing lipoma rather than a sebaceous cyst as believed as I was not able  to find a sac to rupture and no drainage was achieved during procedure. Recommended he move his Gen Surgery appt forward for further evaluation of this given his pain level with the area or move PCP f/u to first thing next week to see if ultrasound can be obtained. Dressing applied, home wound care reviewed at length with patient and wife who voice understanding of this.   Regarding his generalized feeling of fatigue, intermittent SOB offered EKG, CXR and labs and he is agreeable to basic labs today but not other testing. Given his complicated medical hx of DM, HTN, CAD, CVA, COPD discussed some possible serious causes of his sxs and that the safest option would be further evaluation in the ED. He and his wife wish to go home at this time to rest. He would like to keep his scheduled appts for after Christmas at this time, discussed going directly to ED if any sxs worsened immediately and they have voiced understanding of this. Await lab results, f/u on these if abnormal.   Final Clinical Impressions(s) / UC Diagnoses   Final diagnoses:  Mass on back  [facility]   Discharge Instructions   None    ED Prescriptions    Medication Sig Dispense Auth. Provider   doxycycline (VIBRA-TABS) 100 MG tablet Take 1 tablet (100 mg total) by mouth 2 (two) times daily. 14 tablet Volney American, [place]     PDMP not reviewed this encounter.   Volney American, PA-C 06/13/20 0631    Volney American, PA-C 06/13/20 [phone]

## 2020-06-14 DIAGNOSIS — G4733 Obstructive sleep apnea (adult) (pediatric): Secondary | ICD-10-CM | POA: Diagnosis not present

## 2020-06-20 ENCOUNTER — Ambulatory Visit: Payer: Medicare Other | Admitting: Family Medicine

## 2020-06-24 DIAGNOSIS — 419620001 Death: Secondary | SNOMED CT | POA: Diagnosis not present

## 2020-06-24 DEATH — deceased

## 2020-08-10 ENCOUNTER — Ambulatory Visit: Payer: Self-pay | Admitting: Neurology

## 2021-01-04 ENCOUNTER — Encounter (INDEPENDENT_AMBULATORY_CARE_PROVIDER_SITE_OTHER): Payer: Medicare Other | Admitting: Ophthalmology
